# Patient Record
Sex: Male | Born: 1970 | Race: White | Hispanic: No | Marital: Married | Smoking: Current every day smoker
Health system: Southern US, Community
[De-identification: ages and names within clinical notes are randomized; demographics above are authoritative.]

## PROBLEM LIST (undated history)

## (undated) DIAGNOSIS — I4510 Unspecified right bundle-branch block: Secondary | ICD-10-CM

## (undated) DIAGNOSIS — M1990 Unspecified osteoarthritis, unspecified site: Secondary | ICD-10-CM

## (undated) DIAGNOSIS — E7800 Pure hypercholesterolemia, unspecified: Secondary | ICD-10-CM

## (undated) DIAGNOSIS — N201 Calculus of ureter: Secondary | ICD-10-CM

## (undated) DIAGNOSIS — I219 Acute myocardial infarction, unspecified: Secondary | ICD-10-CM

## (undated) DIAGNOSIS — I2510 Atherosclerotic heart disease of native coronary artery without angina pectoris: Secondary | ICD-10-CM

## (undated) DIAGNOSIS — Z87442 Personal history of urinary calculi: Secondary | ICD-10-CM

## (undated) DIAGNOSIS — M109 Gout, unspecified: Secondary | ICD-10-CM

## (undated) DIAGNOSIS — Z973 Presence of spectacles and contact lenses: Secondary | ICD-10-CM

## (undated) HISTORY — PX: LAPAROSCOPIC CHOLECYSTECTOMY: SUR755

## (undated) HISTORY — DX: Pure hypercholesterolemia, unspecified: E78.00

## (undated) HISTORY — PX: OTHER SURGICAL HISTORY: SHX169

## (undated) HISTORY — PX: SHOULDER SURGERY: SHX246

## (undated) HISTORY — PX: CHOLECYSTECTOMY: SHX55

## (undated) HISTORY — PX: APPENDECTOMY: SHX54

## (undated) HISTORY — PX: ORCHIECTOMY: SHX2116

## (undated) HISTORY — PX: BACK SURGERY: SHX140

---

## 2008-06-30 ENCOUNTER — Encounter: Admission: RE | Admit: 2008-06-30 | Discharge: 2008-06-30 | Payer: Self-pay | Admitting: Sports Medicine

## 2011-07-12 ENCOUNTER — Emergency Department (HOSPITAL_COMMUNITY)
Admission: EM | Admit: 2011-07-12 | Discharge: 2011-07-12 | Disposition: A | Discharge status: Home / Self Care | Payer: BC Managed Care – PPO | Attending: Emergency Medicine | Admitting: Emergency Medicine

## 2011-07-12 ENCOUNTER — Encounter (HOSPITAL_COMMUNITY): Payer: Self-pay | Admitting: Emergency Medicine

## 2011-07-12 DIAGNOSIS — K5732 Diverticulitis of large intestine without perforation or abscess without bleeding: Secondary | ICD-10-CM | POA: Insufficient documentation

## 2011-07-12 DIAGNOSIS — R10819 Abdominal tenderness, unspecified site: Secondary | ICD-10-CM | POA: Insufficient documentation

## 2011-07-12 DIAGNOSIS — K5792 Diverticulitis of intestine, part unspecified, without perforation or abscess without bleeding: Secondary | ICD-10-CM

## 2011-07-12 DIAGNOSIS — R319 Hematuria, unspecified: Secondary | ICD-10-CM | POA: Insufficient documentation

## 2011-07-12 DIAGNOSIS — R112 Nausea with vomiting, unspecified: Secondary | ICD-10-CM | POA: Insufficient documentation

## 2011-07-12 LAB — DIFFERENTIAL
Basophils Relative: 0 % (ref 0–1)
Eosinophils Absolute: 0.1 10*3/uL (ref 0.0–0.7)
Monocytes Absolute: 1.1 10*3/uL — ABNORMAL HIGH (ref 0.1–1.0)
Monocytes Relative: 7 % (ref 3–12)
Neutrophils Relative %: 75 % (ref 43–77)

## 2011-07-12 LAB — CBC
Hemoglobin: 16 g/dL (ref 13.0–17.0)
MCH: 29.2 pg (ref 26.0–34.0)
MCHC: 34.8 g/dL (ref 30.0–36.0)

## 2011-07-12 LAB — COMPREHENSIVE METABOLIC PANEL
Albumin: 4.2 g/dL (ref 3.5–5.2)
BUN: 18 mg/dL (ref 6–23)
Creatinine, Ser: 0.89 mg/dL (ref 0.50–1.35)
Potassium: 4.4 mEq/L (ref 3.5–5.1)
Total Protein: 7.1 g/dL (ref 6.0–8.3)

## 2011-07-12 LAB — URINALYSIS, ROUTINE W REFLEX MICROSCOPIC
Glucose, UA: NEGATIVE mg/dL
Specific Gravity, Urine: 1.03 (ref 1.005–1.030)
pH: 5.5 (ref 5.0–8.0)

## 2011-07-12 LAB — URINE MICROSCOPIC-ADD ON

## 2011-07-12 MED ORDER — SODIUM CHLORIDE 0.9 % IV BOLUS (SEPSIS)
1000.0000 mL | Freq: Once | INTRAVENOUS | Status: AC
  Administered 2011-07-12: 1000 mL via INTRAVENOUS

## 2011-07-12 MED ORDER — HYDROMORPHONE HCL PF 1 MG/ML IJ SOLN
1.0000 mg | Freq: Once | INTRAMUSCULAR | Status: AC
  Administered 2011-07-12: 1 mg via INTRAVENOUS
  Filled 2011-07-12: qty 1

## 2011-07-12 MED ORDER — PROMETHAZINE HCL 25 MG/ML IJ SOLN
25.0000 mg | Freq: Four times a day (QID) | INTRAMUSCULAR | Status: DC | PRN
Start: 2011-07-12 — End: 2011-07-13
  Filled 2011-07-12: qty 1

## 2011-07-12 MED ORDER — METRONIDAZOLE IN NACL 5-0.79 MG/ML-% IV SOLN
500.0000 mg | Freq: Once | INTRAVENOUS | Status: AC
  Administered 2011-07-12: 500 mg via INTRAVENOUS
  Filled 2011-07-12: qty 100

## 2011-07-12 MED ORDER — HYDROCODONE-ACETAMINOPHEN 5-325 MG PO TABS: 1.0000 | ORAL_TABLET | ORAL | Status: AC | PRN

## 2011-07-12 MED ORDER — METRONIDAZOLE 500 MG PO TABS: 500.0000 mg | ORAL_TABLET | Freq: Three times a day (TID) | ORAL | Status: DC

## 2011-07-12 MED ORDER — CIPROFLOXACIN HCL 500 MG PO TABS: 500.0000 mg | ORAL_TABLET | Freq: Two times a day (BID) | ORAL | Status: DC

## 2011-07-12 MED ORDER — CIPROFLOXACIN IN D5W 400 MG/200ML IV SOLN
400.0000 mg | Freq: Once | INTRAVENOUS | Status: AC
  Administered 2011-07-12: 400 mg via INTRAVENOUS
  Filled 2011-07-12: qty 200

## 2011-07-12 MED ORDER — ONDANSETRON HCL 4 MG/2ML IJ SOLN
4.0000 mg | Freq: Once | INTRAMUSCULAR | Status: AC
  Administered 2011-07-12: 4 mg via INTRAVENOUS
  Filled 2011-07-12: qty 2

## 2011-07-12 MED ORDER — PROMETHAZINE HCL 25 MG PO TABS: 25.0000 mg | ORAL_TABLET | Freq: Three times a day (TID) | ORAL | Status: DC | PRN

## 2011-07-12 NOTE — ED Notes (Signed)
Onset one week ago right lower back pain and hematuria Seen Doctor and given pain medication.  Today had outpatient CT scan and results bilateral non obstructing renal calculi 5mm. Results with patient.  Sent to ED for evaluation and treatment. Pain currently 9/10 sharp.

## 2011-07-12 NOTE — Discharge Instructions (Signed)
Read the information below.  Follow a clear liquid diet for the next 24-48 hours then slowly build back up to your regular diet.  Please return to the ER immediately if you develop a sudden worsening of your abdominal pain, uncontrolled vomiting, high fevers, or difficulty urinating.  Please call your urologist for a close follow up appointment.   You may return to the ER at any time for worsening condition or any new symptoms that concern you.  Diverticulitis A diverticulum is a small pouch or sac on the colon. Diverticulosis is the presence of these diverticula on the colon. Diverticulitis is the irritation (inflammation) or infection of diverticula. CAUSES  The colon and its diverticula contain bacteria. If food particles block the tiny opening to a diverticulum, the bacteria inside can grow and cause an increase in pressure. This leads to infection and inflammation and is called diverticulitis. SYMPTOMS   Abdominal pain and tenderness. Usually, the pain is located on the left side of your abdomen. However, it could be located elsewhere.   Fever.   Bloating.   Feeling sick to your stomach (nausea).   Throwing up (vomiting).   Abnormal stools.  DIAGNOSIS  Your caregiver will take a history and perform a physical exam. Since many things can cause abdominal pain, other tests may be necessary. Tests may include:  Blood tests.   Urine tests.   X-ray of the abdomen.   CT scan of the abdomen.  Sometimes, surgery is needed to determine if diverticulitis or other conditions are causing your symptoms. TREATMENT  Most of the time, you can be treated without surgery. Treatment includes:  Resting the bowels by only having liquids for a few days. As you improve, you will need to eat a low-fiber diet.   Intravenous (IV) fluids if you are losing body fluids (dehydrated).   Antibiotic medicines that treat infections may be given.   Pain and nausea medicine, if needed.   Surgery if the  inflamed diverticulum has burst.  HOME CARE INSTRUCTIONS   Try a clear liquid diet (broth, tea, or water for as long as directed by your caregiver). You may then gradually begin a low-fiber diet as tolerated. A low-fiber diet is a diet with less than 10 grams of fiber. Choose the foods below to reduce fiber in the diet:   White breads, cereals, rice, and pasta.   Cooked fruits and vegetables or soft fresh fruits and vegetables without the skin.   Ground or well-cooked tender beef, ham, veal, lamb, pork, or poultry.   Eggs and seafood.   After your diverticulitis symptoms have improved, your caregiver may put you on a high-fiber diet. A high-fiber diet includes 14 grams of fiber for every 1000 calories consumed. For a standard 2000 calorie diet, you would need 28 grams of fiber. Follow these diet guidelines to help you increase the fiber in your diet. It is important to slowly increase the amount fiber in your diet to avoid gas, constipation, and bloating.   Choose whole-grain breads, cereals, pasta, and brown rice.   Choose fresh fruits and vegetables with the skin on. Do not overcook vegetables because the more vegetables are cooked, the more fiber is lost.   Choose more nuts, seeds, legumes, dried peas, beans, and lentils.   Look for food products that have greater than 3 grams of fiber per serving on the Nutrition Facts label.   Take all medicine as directed by your caregiver.   If your caregiver has given you  a follow-up appointment, it is very important that you go. Not going could result in lasting (chronic) or permanent injury, pain, and disability. If there is any problem keeping the appointment, call to reschedule.  SEEK MEDICAL CARE IF:   Your pain does not improve.   You have a hard time advancing your diet beyond clear liquids.   Your bowel movements do not return to normal.  SEEK IMMEDIATE MEDICAL CARE IF:   Your pain becomes worse.   You have an oral temperature  above 102 F (38.9 C), not controlled by medicine.   You have repeated vomiting.   You have bloody or black, tarry stools.   Symptoms that brought you to your caregiver become worse or are not getting better.  MAKE SURE YOU:   Understand these instructions.   Will watch your condition.   Will get help right away if you are not doing well or get worse.  Document Released: 11/03/2004 Document Revised: 01/13/2011 Document Reviewed: 03/01/2010 [facility] Patient Information 2012 [place], [place]. Low Fiber and Residue Restricted Diet A low fiber diet restricts foods that contain carbohydrates that are not digested in the small intestine. A diet containing about 10 g of fiber is considered low fiber. The diet needs to be individualized to suit patient tolerances and preferences and to avoid unnecessary restrictions. Generally, the foods emphasized in a low fiber diet have no skins or seeds. They may have been processed to remove bran, germ, or husks. Cooking may not necessarily eliminate the fiber. Cooking may, in fact, enable a greater quantity of fiber to be consumed in a lesser volume. Legumes and nuts are also restricted. The term low residue has also been used to describe low fiber diets, although the two are not the same. Residue refers to any substance that adds to bowel (colonic) contents, such as sloughed cells and intestinal bacteria, in addition to fiber. Residue-containing foods, prunes and prune juice, milk, and connective tissue from meats may also need to be eliminated. It is important to eliminate these foods during sudden (acute) attacks of inflammatory bowel disease, when there is a partial obstruction due to another reason, or when minimal fecal output is desired. When these problems are gone, a more normal diet may be used. PURPOSE  Prevent blockage of a partially obstructed or narrowed gastrointestinal tract.   Reduce stool weight and volume.   Slow the movement of waste.    WHEN IS THIS DIET USED?  Acute phase of Crohn's disease, ulcerative colitis, regional enteritis, or diverticulitis.   Narrowing (stenosis) of intestinal or esophageal tubes (lumina).   Transitional diet following surgery, injury (trauma), or illness.  ADEQUACY This diet is nutritionally adequate based on individual food choices according to the Recommended Dietary Allowances of the [employer]. CHOOSING FOODS Check labels, especially on foods from the starch list. Often, dietary fiber content is listed with the Nutrition Facts panel.  Breads and Starches  Allowed: White, [place], and pita breads, plain rolls, buns, or sweet rolls, doughnuts, waffles, pancakes, bagels. Plain muffins, sweet breads, biscuits, matzoth. Flour. Soda, saltine, or graham crackers. Pretzels, rusks, melba toast, zwieback. Cooked cereals: cornmeal, farina, cream cereals. Dry cereals: refined corn, wheat, rice, and oat cereals (check label). Potatoes prepared any way without skins, refined macaroni, spaghetti, noodles, refined rice.   Avoid: Bread, rolls, or crackers made with whole-wheat, multigrains, rye, bran seeds, nuts, or coconut. Corn tortillas, table-shells. Corn chips, tortilla chips. Cereals containing whole-grains, multigrains, bran, coconut, nuts, or raisins. Cooked or  dry oatmeal. Coarse wheat cereals, granola. Cereals advertised as "high fiber." Potato skins. Whole-grain pasta, wild or brown rice. Popcorn.  Vegetables  Allowed:  Strained tomato and vegetable juices. Fresh: tender lettuce, cucumber, cabbage, spinach, bean sprouts. Cooked, canned: asparagus, bean sprouts, cut green or wax beans, cauliflower, pumpkin, beets, mushrooms, olives, spinach, yellow squash, tomato, tomato sauce (no seeds), zucchini (peeled), turnips. Canned sweet potatoes. Small amounts of celery, onion, radish, and green pepper may be used. Keep servings limited to  cup.   Avoid: Fresh, cooked, or canned: artichokes,  baked beans, beet greens, broccoli, Brussels sprouts, French-style green beans, corn, kale, legumes, peas, sweet potatoes. Cooked: green or red cabbage, spinach. Avoid large servings of any vegetables.  Fruit  Allowed:  All fruit juices except prune juice. Cooked or canned: apricots applesauce, cantaloupe, cherries, grapefruit, grapes, kiwi, mandarin oranges, peaches, pears, fruit cocktail, pineapple, plums, watermelon. Fresh: banana, grapes, cantaloupe, avocado, cherries, pineapple, grapefruit, kiwi, nectarines, peaches, oranges, blueberries, plums. Keep servings limited to  cup or 1 piece.   Avoid: Fresh: apple with or without skin, apricots, mango, pears, raspberries, strawberries. Prune juice, stewed or dried prunes. Dried fruits, raisins, dates. Avoid large servings of all fresh fruits.  Meat and Meat Substitutes  Allowed:  Ground or well-cooked tender beef, ham, veal, lamb, pork, or poultry. Eggs, plain cheese. Fish, oysters, shrimp, lobster, other seafood. Liver, organ meats.   Avoid: Tough, fibrous meats with gristle. Peanut butter, smooth or chunky. Cheese with seeds, nuts, or other foods not allowed. Nuts, seeds, legumes, dried peas, beans, lentils.  Milk  Allowed:  All milk products except those not allowed. Milk and milk product consumption should be minimal when low residue is desired.   Avoid: Yogurt that contains nuts or seeds.  Soups and Combination Foods  Allowed:  Bouillon, broth, or cream soups made from allowed foods. Any strained soup. Casseroles or mixed dishes made with allowed foods.   Avoid: Soups made from vegetables that are not allowed or that contain other foods not allowed.  Desserts and Sweets  Allowed:  Plain cakes and cookies, pie made with allowed fruit, pudding, custard, cream pie. Gelatin, fruit, ice, sherbet, frozen ice pops. Ice cream, ice milk without nuts. Plain hard candy, honey, jelly, molasses, syrup, sugar, chocolate syrup, gumdrops, marshmallows.     Avoid: Desserts, cookies, or candies that contain nuts, peanut butter, or dried fruits. Jams, preserves with seeds, marmalade.  Fats and Oils  Allowed:  Margarine, butter, cream, mayonnaise, salad oils, plain salad dressings made from allowed foods. Plain gravy, crisp bacon without rind.   Avoid: Seeds, nuts, olives. Avocados.  Beverages  Allowed:  All, except those listed to avoid.   Avoid: Fruit juices with high pulp, prune juice.  Condiments  Allowed:  Ketchup, mustard, horseradish, vinegar, cream sauce, cheese sauce, cocoa powder. Spices in moderation: allspice, basil, bay leaves, celery powder or leaves, cinnamon, cumin powder, curry powder, ginger, mace, marjoram, onion or garlic powder, oregano, paprika, parsley flakes, ground pepper, rosemary, sage, savory, tarragon, thyme, turmeric.   Avoid: Coconut, pickles.  SAMPLE MEAL PLAN The following menu is provided as a sample. Your daily menu plans will vary. Be sure to include a minimum of the following each day in order to provide essential nutrients for the adult:  Starch/Bread/Cereal Group, 6 servings.   Fruit/Vegetable Group, 5 servings.   Meat/Meat Substitute Group, 2 servings.   Milk/Milk Substitute Group, 2 servings.  A serving is equal to  cup for fruits, vegetables, and cooked cereals or  1 piece for foods such as a piece of bread, 1 orange, or 1 apple. For dry cereals and crackers, use serving sizes listed on the label. Combination foods may count as full or partial servings from various food groups. Fats, desserts, and sweets may be added to the meal plan after the requirements for essential nutrients are met. SAMPLE MENU Breakfast   cup orange juice.   1 boiled egg.   1 slice white toast.   Margarine.    cup cornflakes.   1 cup milk.   Beverage.  Lunch   cup chicken noodle soup.   2 to 3 oz sliced roast beef.   2 slices seedless rye bread.   Mayonnaise.    cup tomato juice.   1 small  banana.   Beverage.  Dinner  3 oz baked chicken.    cup scalloped potatoes.    cup cooked beets.   White dinner roll.   Margarine.    cup canned peaches.   Beverage.  Document Released: 07/16/2001 Document Revised: 01/13/2011 Document Reviewed: 12/27/2010 [facility] Patient Information 2012 [place], [place].

## 2011-07-12 NOTE — ED Notes (Signed)
Pt states most of his pain is on the left flank. Pt states he has had n/v x1. Pt currently nauseous. Pt appears mildly diaphoretic.

## 2011-07-12 NOTE — ED Notes (Signed)
PA AWARE PT AND FAMILY WOULD LIKE TO TALK TO HER ABOUT THE PLAN OF CARE

## 2011-07-12 NOTE — ED Provider Notes (Signed)
History     CSN: [id number]  Arrival date & time 07/12/11  1432   First MD Initiated Contact with Patient 07/12/11 1545      Chief Complaint  Patient presents with  . Hematuria    (Consider location/radiation/quality/duration/timing/severity/associated sxs/prior treatment) HPI Comments: Patient reports 1 week ago he developed hematuria and right flank pain, was seen by his PCP Wyvonnia Lora, in [place], [place], had a negative UA and was thought to have a kidney stone.  Patient continued to have pain and hematuria, was seen again yesterday and was set up for a CT scan.  CT report (outside facility) showed right kidney stones, no ureteral stones, and possible mild left sigmoid diverticulitis.  Pt states he started developing left flank pain today.  Denies fevers, has had some sweats.  Developed N/V today.  No change in bowel habits - no constipation, diarrhea, or hematochezia.   No hx abdominal surgeries.    Patient is a 41 y.o. male presenting with hematuria. The history is provided by the patient.  Hematuria Irritative symptoms do not include frequency or urgency. Associated symptoms include abdominal pain, nausea and vomiting. Pertinent negatives include no dysuria or fever.    History reviewed. No pertinent past medical history.  Past Surgical History  Procedure Date  . Cholecystectomy   . Shoulder surgery   . Appendectomy     No family history on file.  History  Substance Use Topics  . Smoking status: Current Everyday Smoker  . Smokeless tobacco: Not on file  . Alcohol Use: Yes      Review of Systems  Constitutional: Negative for fever.  Respiratory: Negative for cough and shortness of breath.   Cardiovascular: Negative for chest pain.  Gastrointestinal: Positive for nausea, vomiting and abdominal pain. Negative for diarrhea, constipation and blood in stool.  Genitourinary: Positive for hematuria. Negative for dysuria, urgency and frequency.    Allergies  Review of  patient's allergies indicates no known allergies.  Home Medications   Current Outpatient Rx  Name Route Sig Dispense Refill  . CETIRIZINE HCL 10 MG PO TABS Oral Take 10 mg by mouth daily.    . IBUPROFEN 200 MG PO TABS Oral Take 600 mg by mouth every 6 (six) hours as needed. For pain      BP 137/87  Pulse 76  Temp(Src) 97.6 F (36.4 C) (Oral)  Resp 20  SpO2 97%  Physical Exam  Nursing note and vitals reviewed. Constitutional: He is oriented to person, place, and time. He appears well-developed and well-nourished.  HENT:  Head: Normocephalic and atraumatic.  Neck: Neck supple.  Cardiovascular: Normal rate, regular rhythm and normal heart sounds.   Pulmonary/Chest: Breath sounds normal. No respiratory distress. He has no wheezes. He has no rales. He exhibits no tenderness.  Abdominal: Soft. Bowel sounds are normal. He exhibits no distension and no mass. There is tenderness. There is no rebound and no guarding.       Mild lower abdominal tenderness, diffuse.    Neurological: He is alert and oriented to person, place, and time.  Skin: He is not diaphoretic.    ED Course  Procedures (including critical care time)  Labs Reviewed  CBC - Abnormal; Notable for the following:    WBC 16.5 (*)    All other components within normal limits  DIFFERENTIAL - Abnormal; Notable for the following:    Neutro Abs 12.3 (*)    Monocytes Absolute 1.1 (*)    All other components within normal limits  COMPREHENSIVE METABOLIC PANEL - Abnormal; Notable for the following:    Glucose, Bld 120 (*)    Total Bilirubin 0.2 (*)    All other components within normal limits  URINALYSIS, ROUTINE W REFLEX MICROSCOPIC - Abnormal; Notable for the following:    Color, Urine AMBER (*) BIOCHEMICALS MAY BE AFFECTED BY COLOR   APPearance HAZY (*)    Hgb urine dipstick LARGE (*)    Bilirubin Urine SMALL (*)    Ketones, ur 15 (*)    Protein, ur 30 (*)    Leukocytes, UA SMALL (*)    All other components within  normal limits  URINE MICROSCOPIC-ADD ON - Abnormal; Notable for the following:    Squamous Epithelial / LPF FEW (*)    Casts GRANULAR CAST (*)    All other components within normal limits  URINE CULTURE   No results found.  CT from outside facility - report with patient.    8:41 PM Patient reports he is feeling much better.  Requests PO.  Will do PO trial.  Anticipate discharge home.   Pt did well with PO trial.  Feeling well.  Discussed care instructions for home, return precautions.  Patient verbalizes understanding and agrees with plan.    Filed Vitals:   07/12/11 2119  BP: 118/68  Pulse: 95  Temp: 97.4 F (36.3 C)  Resp:      1. Diverticulitis   2. Hematuria       MDM  Afebrile nontoxic patient with hematuria, no obvious infection on exam.  Outside CT shows likely mild diverticulitis.  Pt having left flank pain on presentation.  Urine sent for culture.  Pt given cipro and flagyl IV in department, pt feeling much better, tolerating PO.  Pt d/c home with cipro/flagyl, PCP and urology follow up (pt has urologist at [facility]).  Care instructions, return precautions given.  Patient verbalizes understanding and agrees with plan.          Dillard Cannon [place], [place] 07/12/11 2201

## 2011-07-12 NOTE — ED Notes (Signed)
Pt aware of urine sample needed but is unable to void, urine cup at bedside

## 2011-07-12 NOTE — ED Notes (Signed)
Pt with return of nausea and increased pain . Pa aware and meds ordered

## 2011-07-12 NOTE — ED Notes (Signed)
Patient reports for the past week he has had blood in his urine. He did see his pmd for this. States had some right flank pain this week but states was not unbearable. States had ct done this morning around 7 am and approx 30 min after ct began with bad left flank pain accompanied by nausea and vomiting. Currently he is not nauseated. States the pain med he was given has "not done much for the pain". Pa made aware and will see pt.

## 2011-07-13 ENCOUNTER — Encounter (HOSPITAL_COMMUNITY): Payer: Self-pay | Admitting: Emergency Medicine

## 2011-07-13 ENCOUNTER — Emergency Department (HOSPITAL_COMMUNITY)
Admission: EM | Admit: 2011-07-13 | Discharge: 2011-07-13 | Disposition: A | Discharge status: Home / Self Care | Payer: BC Managed Care – PPO | Attending: Emergency Medicine | Admitting: Emergency Medicine

## 2011-07-13 DIAGNOSIS — K5792 Diverticulitis of intestine, part unspecified, without perforation or abscess without bleeding: Secondary | ICD-10-CM

## 2011-07-13 DIAGNOSIS — K5732 Diverticulitis of large intestine without perforation or abscess without bleeding: Secondary | ICD-10-CM | POA: Insufficient documentation

## 2011-07-13 DIAGNOSIS — M549 Dorsalgia, unspecified: Secondary | ICD-10-CM | POA: Insufficient documentation

## 2011-07-13 DIAGNOSIS — Z9079 Acquired absence of other genital organ(s): Secondary | ICD-10-CM | POA: Insufficient documentation

## 2011-07-13 DIAGNOSIS — R319 Hematuria, unspecified: Secondary | ICD-10-CM | POA: Insufficient documentation

## 2011-07-13 DIAGNOSIS — R109 Unspecified abdominal pain: Secondary | ICD-10-CM

## 2011-07-13 DIAGNOSIS — Z9089 Acquired absence of other organs: Secondary | ICD-10-CM | POA: Insufficient documentation

## 2011-07-13 DIAGNOSIS — R112 Nausea with vomiting, unspecified: Secondary | ICD-10-CM | POA: Insufficient documentation

## 2011-07-13 LAB — COMPREHENSIVE METABOLIC PANEL
ALT: 17 U/L (ref 0–53)
AST: 17 U/L (ref 0–37)
Albumin: 4.1 g/dL (ref 3.5–5.2)
Alkaline Phosphatase: 81 U/L (ref 39–117)
BUN: 14 mg/dL (ref 6–23)
Chloride: 101 mEq/L (ref 96–112)
Potassium: 3.9 mEq/L (ref 3.5–5.1)
Sodium: 136 mEq/L (ref 135–145)
Total Bilirubin: 0.3 mg/dL (ref 0.3–1.2)

## 2011-07-13 LAB — CBC
Hemoglobin: 15.2 g/dL (ref 13.0–17.0)
MCHC: 35.3 g/dL (ref 30.0–36.0)
RBC: 5.17 MIL/uL (ref 4.22–5.81)

## 2011-07-13 LAB — DIFFERENTIAL
Basophils Relative: 0 % (ref 0–1)
Lymphs Abs: 1.6 10*3/uL (ref 0.7–4.0)
Monocytes Relative: 7 % (ref 3–12)
Neutro Abs: 11.8 10*3/uL — ABNORMAL HIGH (ref 1.7–7.7)
Neutrophils Relative %: 82 % — ABNORMAL HIGH (ref 43–77)

## 2011-07-13 LAB — URINALYSIS, ROUTINE W REFLEX MICROSCOPIC
Bilirubin Urine: NEGATIVE
Glucose, UA: NEGATIVE mg/dL
Ketones, ur: NEGATIVE mg/dL
Protein, ur: NEGATIVE mg/dL

## 2011-07-13 LAB — URINE MICROSCOPIC-ADD ON

## 2011-07-13 MED ORDER — ONDANSETRON 8 MG PO TBDP: 8.0000 mg | ORAL_TABLET | Freq: Three times a day (TID) | ORAL | Status: DC | PRN

## 2011-07-13 MED ORDER — MORPHINE SULFATE 4 MG/ML IJ SOLN
4.0000 mg | Freq: Once | INTRAMUSCULAR | Status: AC
  Administered 2011-07-13: 4 mg via INTRAVENOUS
  Filled 2011-07-13: qty 1

## 2011-07-13 MED ORDER — ONDANSETRON 8 MG PO TBDP: 8.0000 mg | ORAL_TABLET | Freq: Three times a day (TID) | ORAL | Status: AC | PRN

## 2011-07-13 MED ORDER — SODIUM CHLORIDE 0.9 % IV BOLUS (SEPSIS)
1000.0000 mL | Freq: Once | INTRAVENOUS | Status: AC
  Administered 2011-07-13: 1000 mL via INTRAVENOUS

## 2011-07-13 MED ORDER — HYDROMORPHONE HCL PF 1 MG/ML IJ SOLN
1.0000 mg | Freq: Once | INTRAMUSCULAR | Status: AC
  Administered 2011-07-13: 1 mg via INTRAVENOUS
  Filled 2011-07-13: qty 1

## 2011-07-13 MED ORDER — ONDANSETRON HCL 4 MG/2ML IJ SOLN
4.0000 mg | Freq: Once | INTRAMUSCULAR | Status: AC
  Administered 2011-07-13: 4 mg via INTRAVENOUS
  Filled 2011-07-13: qty 2

## 2011-07-13 MED ORDER — CIPROFLOXACIN HCL 500 MG PO TABS
500.0000 mg | ORAL_TABLET | Freq: Once | ORAL | Status: AC
  Administered 2011-07-13: 500 mg via ORAL
  Filled 2011-07-13: qty 1

## 2011-07-13 MED ORDER — METRONIDAZOLE 500 MG PO TABS
500.0000 mg | ORAL_TABLET | Freq: Once | ORAL | Status: AC
  Administered 2011-07-13: 500 mg via ORAL
  Filled 2011-07-13: qty 1

## 2011-07-13 NOTE — ED Notes (Signed)
Pt was having abd pain and had a ct scan done at [facility] and they [facility] to [facility] yesterday. Was treated for divicticulits at [facility] last night. Went home and return this am with flank pain testicle pain on lt side and still has blood in the urine. Nausea.

## 2011-07-13 NOTE — ED Provider Notes (Signed)
History     CSN: [id number]  Arrival date & time 07/13/11  0829   First MD Initiated Contact with Patient 07/13/11 0844      9:29 AM HPI Patient reports he is here with persistent left lower abdominal pain, left flank pain and left lower back pain. Reports he was seen at [facility] yesterday and treated for diverticulitis after having a CT scan done at [facility]. Reports the CT scan indicated diverticulitis the patient also reports having blood in urine. Patient reports last eye discharged after his pharmacy closed and was unable to have his Cipro, Flagyl, analgesics and antiemetics failed. States has been feeling nauseous and vomiting all night as well as worsening left lower quadrant pain. Reports he is returned because pain and nausea are so severe in symptoms appear to be worsening. Patient is a 41 y.o. male presenting with abdominal pain. The history is provided by the patient.  Abdominal Pain The primary symptoms of the illness include abdominal pain, nausea and vomiting. The primary symptoms of the illness do not include diarrhea or dysuria. The onset of the illness was gradual. The problem has been gradually worsening.  The patient states that she believes she is currently not pregnant. The patient has not had a change in bowel habit. Additional symptoms associated with the illness include hematuria and back pain. Symptoms associated with the illness do not include chills, anorexia, diaphoresis, constipation, urgency or frequency.    History reviewed. No pertinent past medical history.  Past Surgical History  Procedure Date  . Cholecystectomy   . Shoulder surgery   . Appendectomy     No family history on file.  History  Substance Use Topics  . Smoking status: Current Everyday Smoker  . Smokeless tobacco: Not on file  . Alcohol Use: Yes      Review of Systems  Constitutional: Negative for chills and diaphoresis.  Gastrointestinal: Positive for nausea, vomiting and  abdominal pain. Negative for diarrhea, constipation and anorexia.  Genitourinary: Positive for hematuria. Negative for dysuria, urgency and frequency.  Musculoskeletal: Positive for back pain.  All other systems reviewed and are negative.    Allergies  Review of patient's allergies indicates no known allergies.  Home Medications   Current Outpatient Rx  Name Route Sig Dispense Refill  . CETIRIZINE HCL 10 MG PO TABS Oral Take 10 mg by mouth daily.    Marland Kitchen CIPROFLOXACIN HCL 500 MG PO TABS Oral Take 1 tablet (500 mg total) by mouth 2 (two) times daily. 14 tablet 0  . HYDROCODONE-ACETAMINOPHEN 5-325 MG PO TABS Oral Take 1-2 tablets by mouth every 4 (four) hours as needed for pain. 25 tablet 0  . IBUPROFEN 200 MG PO TABS Oral Take 600 mg by mouth every 6 (six) hours as needed. For pain    . METRONIDAZOLE 500 MG PO TABS Oral Take 1 tablet (500 mg total) by mouth 3 (three) times daily. 21 tablet 0  . PROMETHAZINE HCL 25 MG PO TABS Oral Take 1 tablet (25 mg total) by mouth every 8 (eight) hours as needed for nausea. 20 tablet 0    BP 148/72  Pulse 69  Temp(Src) 98.4 F (36.9 C) (Oral)  Resp 18  SpO2 96%  Physical Exam  Vitals reviewed. Constitutional: He is oriented to person, place, and time. He appears well-developed and well-nourished.  HENT:  Head: Normocephalic and atraumatic.  Eyes: Conjunctivae are normal. Pupils are equal, round, and reactive to light.  Neck: Normal range of motion. Neck  supple.  Cardiovascular: Normal rate, regular rhythm and normal heart sounds.   Pulmonary/Chest: Effort normal and breath sounds normal.  Abdominal: Soft. Bowel sounds are normal. He exhibits no distension and no mass. There is no tenderness. There is no rebound, no guarding and no CVA tenderness.  Genitourinary: Penis normal. No phimosis, paraphimosis, penile erythema or penile tenderness.       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  Skin: Skin is warm and dry. No rash noted. No erythema. No pallor.  Psychiatric: He has a normal mood and affect. His behavior is normal.    ED Course  Procedures   Results for orders placed during the hospital encounter of 07/13/11  CBC      Component Value Range   WBC 14.4 (*) 4.0 - 10.5 (K/uL)   RBC 5.17  4.22 - 5.81 (MIL/uL)   Hemoglobin 15.2  13.0 - 17.0 (g/dL)   HCT 16.1  09.6 - 04.5 (%)   MCV 83.2  78.0 - 100.0 (fL)   MCH 29.4  26.0 - 34.0 (pg)   MCHC 35.3  30.0 - 36.0 (g/dL)   RDW 40.9  81.1 - 91.4 (%)   Platelets 192  150 - 400 (K/uL)  DIFFERENTIAL      Component Value Range   Neutrophils Relative 82 (*) 43 - 77 (%)   Neutro Abs 11.8 (*) 1.7 - 7.7 (K/uL)   Lymphocytes Relative 11 (*) 12 - 46 (%)   Lymphs Abs 1.6  0.7 - 4.0 (K/uL)   Monocytes Relative 7  3 - 12 (%)   Monocytes Absolute 1.0  0.1 - 1.0 (K/uL)   Eosinophils Relative 0  0 - 5 (%)   Eosinophils Absolute 0.0  0.0 - 0.7 (K/uL)   Basophils Relative 0  0 - 1 (%)   Basophils Absolute 0.0  0.0 - 0.1 (K/uL)  COMPREHENSIVE METABOLIC PANEL      Component Value Range   Sodium 136  135 - 145 (mEq/L)   Potassium 3.9  3.5 - 5.1 (mEq/L)   Chloride 101  96 - 112 (mEq/L)   CO2 25  19 - 32 (mEq/L)   Glucose, Bld 131 (*) 70 - 99 (mg/dL)   BUN 14  6 - 23 (mg/dL)   Creatinine, Ser 7.82  0.50 - 1.35 (mg/dL)   Calcium 8.7  8.4 - 95.6 (mg/dL)   Total Protein 7.2  6.0 - 8.3 (g/dL)   Albumin 4.1  3.5 - 5.2 (g/dL)   AST 17  0 - 37 (U/L)   ALT 17  0 - 53 (U/L)   Alkaline Phosphatase 81  39 - 117 (U/L)   Total Bilirubin 0.3  0.3 - 1.2 (mg/dL)   GFR calc non Af Amer >90  >90 (mL/min)   GFR calc Af Amer >90  >90 (mL/min)  URINALYSIS, ROUTINE W REFLEX MICROSCOPIC      Component Value Range   Color, Urine YELLOW  YELLOW    APPearance CLOUDY (*) CLEAR    Specific Gravity, Urine 1.032 (*) 1.005 - 1.030    pH 5.5  5.0 - 8.0    Glucose, UA NEGATIVE  NEGATIVE (mg/dL)   Hgb urine dipstick LARGE (*) NEGATIVE     Bilirubin Urine NEGATIVE  NEGATIVE    Ketones, ur NEGATIVE  NEGATIVE (mg/dL)   Protein, ur NEGATIVE  NEGATIVE (mg/dL)   Urobilinogen, UA 0.2  0.0 - 1.0 (mg/dL)   Nitrite  NEGATIVE  NEGATIVE    Leukocytes, UA NEGATIVE  NEGATIVE   URINE MICROSCOPIC-ADD ON      Component Value Range   Squamous Epithelial / LPF RARE  RARE    WBC, UA 3-6  <3 (WBC/hpf)   RBC / HPF 21-50  <3 (RBC/hpf)   Bacteria, UA FEW (*) RARE    Urine-Other MUCOUS PRESENT      MDM   9:00 AM  Pt not in room  2:05 PM Patient has improved labs from yesterday. Pain improved significantly after analgesia and antiemetics. Advised patient to have his medications filled as soon as he was discharged . Patient has not failed outpatient treatment since he has not had medication filled. I have ordered for his PO antibiotics to be give here since he has not taken it. At this point the patient has received 2 doses of ABx: 1 IV dose last night. And 1 PO dose slightly >12 hours later. Since labs have improved as well as pain and nausea, I discussed discharging patient with strict instructions to fill medication no matter what. Advised followup primary care physician if persistent pain or return to emergency department for worsening pain voices understanding and is ready for discharge.        Thomasene Lot, PA-C 07/14/11 [id number]

## 2011-07-13 NOTE — Discharge Instructions (Signed)
Low Fiber and Residue Restricted Diet A low fiber diet restricts foods that contain carbohydrates that are not digested in the small intestine. A diet containing about 10 g of fiber is considered low fiber. The diet needs to be individualized to suit patient tolerances and preferences and to avoid unnecessary restrictions. Generally, the foods emphasized in a low fiber diet have no skins or seeds. They may have been processed to remove bran, germ, or husks. Cooking may not necessarily eliminate the fiber. Cooking may, in fact, enable a greater quantity of fiber to be consumed in a lesser volume. Legumes and nuts are also restricted. The term low residue has also been used to describe low fiber diets, although the two are not the same. Residue refers to any substance that adds to bowel (colonic) contents, such as sloughed cells and intestinal bacteria, in addition to fiber. Residue-containing foods, prunes and prune juice, milk, and connective tissue from meats may also need to be eliminated. It is important to eliminate these foods during sudden (acute) attacks of inflammatory bowel disease, when there is a partial obstruction due to another reason, or when minimal fecal output is desired. When these problems are gone, a more normal diet may be used. PURPOSE  Prevent blockage of a partially obstructed or narrowed gastrointestinal tract.   Reduce stool weight and volume.   Slow the movement of waste.  WHEN IS THIS DIET USED?  Acute phase of Crohn's disease, ulcerative colitis, regional enteritis, or diverticulitis.   Narrowing (stenosis) of intestinal or esophageal tubes (lumina).   Transitional diet following surgery, injury (trauma), or illness.  ADEQUACY This diet is nutritionally adequate based on individual food choices according to the Recommended Dietary Allowances of the National Research Council. CHOOSING FOODS Check labels, especially on foods from the starch list. Often, dietary fiber  content is listed with the Nutrition Facts panel.  Breads and Starches  Allowed: White, French, and pita breads, plain rolls, buns, or sweet rolls, doughnuts, waffles, pancakes, bagels. Plain muffins, sweet breads, biscuits, matzoth. Flour. Soda, saltine, or graham crackers. Pretzels, rusks, melba toast, zwieback. Cooked cereals: cornmeal, farina, cream cereals. Dry cereals: refined corn, wheat, rice, and oat cereals (check label). Potatoes prepared any way without skins, refined macaroni, spaghetti, noodles, refined rice.   Avoid: Bread, rolls, or crackers made with whole-wheat, multigrains, rye, bran seeds, nuts, or coconut. Corn tortillas, table-shells. Corn chips, tortilla chips. Cereals containing whole-grains, multigrains, bran, coconut, nuts, or raisins. Cooked or dry oatmeal. Coarse wheat cereals, granola. Cereals advertised as "high fiber." Potato skins. Whole-grain pasta, wild or brown rice. Popcorn.  Vegetables  Allowed:  Strained tomato and vegetable juices. Fresh: tender lettuce, cucumber, cabbage, spinach, bean sprouts. Cooked, canned: asparagus, bean sprouts, cut green or wax beans, cauliflower, pumpkin, beets, mushrooms, olives, spinach, yellow squash, tomato, tomato sauce (no seeds), zucchini (peeled), turnips. Canned sweet potatoes. Small amounts of celery, onion, radish, and green pepper may be used. Keep servings limited to  cup.   Avoid: Fresh, cooked, or canned: artichokes, baked beans, beet greens, broccoli, Brussels sprouts, French-style green beans, corn, kale, legumes, peas, sweet potatoes. Cooked: green or red cabbage, spinach. Avoid large servings of any vegetables.  Fruit  Allowed:  All fruit juices except prune juice. Cooked or canned: apricots applesauce, cantaloupe, cherries, grapefruit, grapes, kiwi, mandarin oranges, peaches, pears, fruit cocktail, pineapple, plums, watermelon. Fresh: banana, grapes, cantaloupe, avocado, cherries, pineapple, grapefruit, kiwi,  nectarines, peaches, oranges, blueberries, plums. Keep servings limited to  cup or 1 piece.     Avoid: Fresh: apple with or without skin, apricots, mango, pears, raspberries, strawberries. Prune juice, stewed or dried prunes. Dried fruits, raisins, dates. Avoid large servings of all fresh fruits.  Meat and Meat Substitutes  Allowed:  Ground or well-cooked tender beef, ham, veal, lamb, pork, or poultry. Eggs, plain cheese. Fish, oysters, shrimp, lobster, other seafood. Liver, organ meats.   Avoid: Tough, fibrous meats with gristle. Peanut butter, smooth or chunky. Cheese with seeds, nuts, or other foods not allowed. Nuts, seeds, legumes, dried peas, beans, lentils.  Milk  Allowed:  All milk products except those not allowed. Milk and milk product consumption should be minimal when low residue is desired.   Avoid: Yogurt that contains nuts or seeds.  Soups and Combination Foods  Allowed:  Bouillon, broth, or cream soups made from allowed foods. Any strained soup. Casseroles or mixed dishes made with allowed foods.   Avoid: Soups made from vegetables that are not allowed or that contain other foods not allowed.  Desserts and Sweets  Allowed:  Plain cakes and cookies, pie made with allowed fruit, pudding, custard, cream pie. Gelatin, fruit, ice, sherbet, frozen ice pops. Ice cream, ice milk without nuts. Plain hard candy, honey, jelly, molasses, syrup, sugar, chocolate syrup, gumdrops, marshmallows.   Avoid: Desserts, cookies, or candies that contain nuts, peanut butter, or dried fruits. Jams, preserves with seeds, marmalade.  Fats and Oils  Allowed:  Margarine, butter, cream, mayonnaise, salad oils, plain salad dressings made from allowed foods. Plain gravy, crisp bacon without rind.   Avoid: Seeds, nuts, olives. Avocados.  Beverages  Allowed:  All, except those listed to avoid.   Avoid: Fruit juices with high pulp, prune juice.  Condiments  Allowed:  Ketchup, mustard, horseradish,  vinegar, cream sauce, cheese sauce, cocoa powder. Spices in moderation: allspice, basil, bay leaves, celery powder or leaves, cinnamon, cumin powder, curry powder, ginger, mace, marjoram, onion or garlic powder, oregano, paprika, parsley flakes, ground pepper, rosemary, sage, savory, tarragon, thyme, turmeric.   Avoid: Coconut, pickles.  SAMPLE MEAL PLAN The following menu is provided as a sample. Your daily menu plans will vary. Be sure to include a minimum of the following each day in order to provide essential nutrients for the adult:  Starch/Bread/Cereal Group, 6 servings.   Fruit/Vegetable Group, 5 servings.   Meat/Meat Substitute Group, 2 servings.   Milk/Milk Substitute Group, 2 servings.  A serving is equal to  cup for fruits, vegetables, and cooked cereals or 1 piece for foods such as a piece of bread, 1 orange, or 1 apple. For dry cereals and crackers, use serving sizes listed on the label. Combination foods may count as full or partial servings from various food groups. Fats, desserts, and sweets may be added to the meal plan after the requirements for essential nutrients are met. SAMPLE MENU Breakfast   cup orange juice.   1 boiled egg.   1 slice white toast.   Margarine.    cup cornflakes.   1 cup milk.   Beverage.  Lunch   cup chicken noodle soup.   2 to 3 oz sliced roast beef.   2 slices seedless rye bread.   Mayonnaise.    cup tomato juice.   1 small banana.   Beverage.  Dinner  3 oz baked chicken.    cup scalloped potatoes.    cup cooked beets.   White dinner roll.   Margarine.    cup canned peaches.   Beverage.  Document Released: 07/16/2001 Document Revised: 01/13/2011   Document Reviewed: 12/27/2010 Exit[employer] Patient Information 2012 [employer]. 

## 2011-07-14 NOTE — ED Provider Notes (Signed)
Medical screening examination/treatment/procedure(s) were performed by non-physician practitioner and as supervising physician I was immediately available for consultation/collaboration.   Jase Reep B. Terricka Onofrio, MD 07/14/11 1246 

## 2011-07-15 LAB — URINE CULTURE
Colony Count: NO GROWTH
Culture  Setup Time: 201306041918

## 2011-07-18 NOTE — ED Provider Notes (Signed)
Medical screening examination/treatment/procedure(s) were performed by non-physician practitioner and as supervising physician I was immediately available for consultation/collaboration. Devoria Albe, MD, FACEP   Ward Givens, MD 07/18/11 1500

## 2015-09-10 ENCOUNTER — Ambulatory Visit: Payer: BLUE CROSS/BLUE SHIELD | Attending: Neurosurgery | Admitting: Physical Therapy

## 2015-09-10 DIAGNOSIS — M5441 Lumbago with sciatica, right side: Secondary | ICD-10-CM | POA: Insufficient documentation

## 2015-09-10 NOTE — Therapy (Signed)
[facility] [place], [place], [address] Phone: [phone]   Fax:  [phone]  Physical Therapy Evaluation  Patient Details  Name: Derrick Turner MRN: [id number] Date of Birth: 02-Nov-1970 Referring Provider: Consuella Lose MD  Encounter Date: 09/10/2015      PT End of Session - 09/10/15 1456    Visit Number 1   Number of Visits 12   Date for PT Re-Evaluation 10/22/15   PT Start Time 0153   PT Stop Time 0240   PT Time Calculation (min) 47 min   Activity Tolerance Patient tolerated treatment well   Behavior During Therapy [facility] for tasks assessed/performed      No past medical history on file.  Past Surgical History:  Procedure Laterality Date  . APPENDECTOMY    . CHOLECYSTECTOMY    . SHOULDER SURGERY      There were no vitals filed for this visit.       Subjective Assessment - 09/10/15 1419    Subjective The Dr. thinks a bone might be moving in my low back.   Pertinent History 2 previous lumbar surgeries.   Limitations Sitting;Standing   How long can you sit comfortably? 10-15 minutes.   How long can you stand comfortably? 10-15 minutes.   Patient Stated Goals Get out of pain.   Currently in Pain? Yes   Pain Score 8    Pain Location Back   Pain Orientation Right   Pain Descriptors / Indicators Sharp   Pain Type Chronic pain   Pain Onset More than a month ago   Pain Frequency Constant   Aggravating Factors  Prolonged sitting and standing.   Pain Relieving Factors Rest.   Multiple Pain Sites Yes            [facility] PT Assessment - 09/10/15 0001      Assessment   Medical Diagnosis Chronic right sided low back pain without sciatica.   Referring Provider Consuella Lose MD   Onset Date/Surgical Date --  9 months.     Precautions   Precautions None     Restrictions   Weight Bearing Restrictions No     Balance Screen   Has the patient fallen in the past 6 months No   Has the patient  had a decrease in activity level because of a fear of falling?  No   Is the patient reluctant to leave their home because of a fear of falling?  No     Home Ecologist residence     Prior Function   Level of Independence Independent     Posture/Postural Control   Posture/Postural Control No significant limitations     ROM / Strength   AROM / PROM / Strength AROM;Strength     AROM   Overall AROM Comments Active lumbar flexion limited by 50% and extension= 17 degrees.     Strength   Overall Strength Comments Normal bilateral LE strength.     Palpation   Palpation comment Tender to palpation in right low back musculature.  Right buttock pain appears to be referred.     Special Tests    Special Tests --  Absent RT Achiiles DTR; (-) SLR;(-)leg lengths.     Ambulation/Gait   Gait Comments WNL.                   [facility] Adult PT Treatment/Exercise - 09/10/15 0001      Modalities   Modalities  Electrical Stimulation;Moist Heat     Moist Heat Therapy   Number Minutes Moist Heat 15 Minutes   Moist Heat Location --  Low back.     Acupuncturist Location Left low back.   Electrical Stimulation Action Pre-mod   Electrical Stimulation Parameters 80-150 Hz.   Electrical Stimulation Goals Pain                     PT Long Term Goals - 09/10/15 1424      PT LONG TERM GOAL #1   Title Ind with a HEP.   Time 6   Period Weeks   Status New     PT LONG TERM GOAL #2   Title Sit 30 minutes with pain not > 3/10.   Time 6   Period Weeks   Status New     PT LONG TERM GOAL #3   Title Stand 30 minutes with pain not > 3/10.   Time 6   Period Weeks     PT LONG TERM GOAL #4   Title Eliminate right LE pain.   Time 6   Period Weeks   Status New               Plan - 09/10/15 1421    Clinical Impression Statement The patient underwent a lumbar surgery in July of 2016.  He said he was  great for about 3 months but then his pain began to comeback and radiate into his right buttock.  His pain rise to close to a 10/10 after sitting for prolonged periods of time.  Standing also increases her pain.  He has palpable right lowwr lumbar pain and radiation into the right buttock.   Rehab Potential Good   PT Frequency 2x / week   PT Duration 6 weeks   PT Treatment/Interventions ADLs/Self Care Home Management;Electrical Stimulation;Moist Heat;Ultrasound;Patient/family education;Therapeutic activities;Therapeutic exercise;Manual techniques   PT Next Visit Plan Modalities and STW/M to right low back region.  Progress with back stabilization exercises.   Consulted and Agree with Plan of Care Patient      Patient will benefit from skilled therapeutic intervention in order to improve the following deficits and impairments:  Pain, Decreased activity tolerance  Visit Diagnosis: Lumbago with sciatica, right side - Plan: PT plan of care cert/re-cert     Problem List There are no active problems to display for this patient.   Elynor Kallenberger, [place] MPT 09/10/2015, 3:01 PM  [facility] [address] [place], [place], [address] Phone: [phone]   Fax:  [phone]  Name: Derrick Turner MRN: [id number] Date of Birth: 1970/11/24

## 2015-09-17 ENCOUNTER — Ambulatory Visit: Payer: BLUE CROSS/BLUE SHIELD | Admitting: *Deleted

## 2015-09-17 DIAGNOSIS — M5441 Lumbago with sciatica, right side: Secondary | ICD-10-CM | POA: Diagnosis not present

## 2015-09-17 NOTE — Therapy (Signed)
[facility] [address] [place], [place], [address] Phone: [phone]   Fax:  [phone]  Physical Therapy Treatment  Patient Details  Name: Derrick Turner MRN: [id number] Date of Birth: 03/04/1970 Referring Provider: Neelesh Nundkumar MD  Encounter Date: 09/17/2015      PT End of Session - 09/17/15 1800    Visit Number 2   Number of Visits 12   Date for PT Re-Evaluation 10/22/15   PT Start Time 1645   PT Stop Time 1737   PT Time Calculation (min) 52 min      No past medical history on file.  Past Surgical History:  Procedure Laterality Date  . APPENDECTOMY    . CHOLECYSTECTOMY    . SHOULDER SURGERY      There were no vitals filed for this visit.      Subjective Assessment - 09/17/15 1757    Pertinent History 2 previous lumbar surgeries.   Limitations Sitting;Standing   How long can you sit comfortably? 10-15 minutes.   How long can you stand comfortably? 10-15 minutes.   Patient Stated Goals Get out of pain.   Currently in Pain? Yes   Pain Score 6    Pain Location Back   Pain Orientation Right   Pain Descriptors / Indicators Sharp   Pain Type Chronic pain   Pain Onset More than a month ago   Pain Frequency Constant                         [facility] Adult PT Treatment/Exercise - 09/17/15 0001      Modalities   Modalities Electrical Stimulation;Moist Heat;Ultrasound     Moist Heat Therapy   Number Minutes Moist Heat 15 Minutes   Moist Heat Location Lumbar Spine     Electrical Stimulation   Electrical Stimulation Location Left low back. premod 80-150hz, x 15 mins   Electrical Stimulation Goals Pain     Ultrasound   Ultrasound Location RT side LB   Ultrasound Parameters 1.5 w/cm2 x 10 mins in prone   Ultrasound Goals Pain     Manual Therapy   Manual Therapy Soft tissue mobilization;Myofascial release   Soft tissue mobilization STW / IASTW to RT side thoracolumbar paras in prone                      PT Long Term Goals - 09/10/15 1424      PT LONG TERM GOAL #1   Title Ind with a HEP.   Time 6   Period Weeks   Status New     PT LONG TERM GOAL #2   Title Sit 30 minutes with pain not > 3/10.   Time 6   Period Weeks   Status New     PT LONG TERM GOAL #3   Title Stand 30 minutes with pain not > 3/10.   Time 6   Period Weeks     PT LONG TERM GOAL #4   Title Eliminate right LE pain.   Time 6   Period Weeks   Status New               Plan - 09/17/15 1801    Clinical Impression Statement Pt did fairly well today with Rx and had decreased pain before leaving clinic. He had a  normal response to modalities, but had notable tightness in RT side LB paras felt during STW. No goals met today and are   ongoing   Rehab Potential Good   PT Frequency 2x / week   PT Duration 6 weeks   PT Treatment/Interventions ADLs/Self Care Home Management;Electrical Stimulation;Moist Heat;Ultrasound;Patient/family education;Therapeutic activities;Therapeutic exercise;Manual techniques   PT Next Visit Plan Modalities and STW/M to right low back region.  Progress with back stabilization exercises.   Consulted and Agree with Plan of Care Patient      Patient will benefit from skilled therapeutic intervention in order to improve the following deficits and impairments:  Pain, Decreased activity tolerance  Visit Diagnosis: Lumbago with sciatica, right side     Problem List There are no active problems to display for this patient.   RAMSEUR,CHRIS, PTA 09/17/2015, 6:07 PM  [facility] [address] [place], [place], [address] Phone: [phone]   Fax:  [phone]  Name: Derrick Turner MRN: [id number] Date of Birth: 03/03/1970    

## 2015-09-22 ENCOUNTER — Ambulatory Visit: Payer: BLUE CROSS/BLUE SHIELD | Admitting: Physical Therapy

## 2015-09-22 DIAGNOSIS — M5441 Lumbago with sciatica, right side: Secondary | ICD-10-CM | POA: Diagnosis not present

## 2015-09-22 NOTE — Therapy (Signed)
[facility] [place], [place], [address] Phone: [phone]   Fax:  [phone]  Physical Therapy Treatment  Patient Details  Name: Derrick Turner MRN: [id number] Date of Birth: 03/13/1970 Referring Provider: Consuella Lose MD  Encounter Date: 09/22/2015      PT End of Session - 09/22/15 1804    Visit Number 3   Number of Visits 12   Date for PT Re-Evaluation 10/22/15   PT Start Time 0450   PT Stop Time 0550   PT Time Calculation (min) 60 min   Activity Tolerance Patient tolerated treatment well   Behavior During Therapy [facility] for tasks assessed/performed      No past medical history on file.  Past Surgical History:  Procedure Laterality Date  . APPENDECTOMY    . CHOLECYSTECTOMY    . SHOULDER SURGERY      There were no vitals filed for this visit.      Subjective Assessment - 09/22/15 1711    Subjective I felt better after the last treatment but then I took a long trip back and forth to the beach and my pain increased again.   Pain Score 6    Pain Location Back   Pain Orientation Right                         [facility] Adult PT Treatment/Exercise - 09/22/15 0001      Modalities   Modalities Electrical Stimulation;Moist Heat;Ultrasound     Moist Heat Therapy   Number Minutes Moist Heat 20 Minutes   Moist Heat Location Lumbar Spine     Electrical Stimulation   Electrical Stimulation Location Right low back.   Electrical Stimulation Action Pre-mod   Electrical Stimulation Parameters 80-150 Hz.   Electrical Stimulation Goals Pain     Ultrasound   Ultrasound Location RT LB   Ultrasound Parameters 1.50 W/CM2 x 12 minutes left sdly position with pillows between knees for comfort.   Ultrasound Goals Pain     Manual Therapy   Manual Therapy Soft tissue mobilization   Soft tissue mobilization STW/M x 11 minutes.                     PT Long Term Goals - 09/10/15 1424      PT LONG TERM GOAL #1   Title Ind with a HEP.   Time 6   Period Weeks   Status New     PT LONG TERM GOAL #2   Title Sit 30 minutes with pain not > 3/10.   Time 6   Period Weeks   Status New     PT LONG TERM GOAL #3   Title Stand 30 minutes with pain not > 3/10.   Time 6   Period Weeks     PT LONG TERM GOAL #4   Title Eliminate right LE pain.   Time 6   Period Weeks   Status New             Patient will benefit from skilled therapeutic intervention in order to improve the following deficits and impairments:  Pain, Decreased activity tolerance  Visit Diagnosis: Lumbago with sciatica, right side     Problem List There are no active problems to display for this patient.   Hadyn Azer, [place] MPT 09/22/2015, 6:18 PM  [facility] [address] [place], [place], [address] Phone: [phone]   Fax:  [phone]  Name:  DASEAN BOUTELL MRN: [id number] Date of Birth: 1970/10/27

## 2015-09-25 ENCOUNTER — Ambulatory Visit: Payer: BLUE CROSS/BLUE SHIELD | Admitting: Physical Therapy

## 2015-09-25 ENCOUNTER — Encounter: Payer: Self-pay | Admitting: Physical Therapy

## 2015-09-25 DIAGNOSIS — M5441 Lumbago with sciatica, right side: Secondary | ICD-10-CM | POA: Diagnosis not present

## 2015-09-25 NOTE — Patient Instructions (Addendum)
Stretching: Piriformis (Supine)    Pull right knee toward opposite shoulder. Hold __30__ seconds. Relax. Repeat _3___ times per set. Do ____ sets per session. Do __2-3__ sessions per day.  http://orth.exer.us/712   Copyright  VHI. All rights reserved.  Knee-to-Chest Stretch: Unilateral    With hand behind right knee, pull knee in to chest until a comfortable stretch is felt in lower back and buttocks. Keep back relaxed. Hold __30__ seconds. Repeat _3___ times per set. Do ____ sets per session. Do __2-3__ sessions per day.  http://orth.exer.us/126   Copyright  VHI. All rights reserved.

## 2015-09-25 NOTE — Therapy (Signed)
[facility] [place], [place], [address] Phone: [phone]   Fax:  [phone]  Physical Therapy Treatment  Patient Details  Name: Derrick Turner MRN: [id number] Date of Birth: 06/13/1970 Referring Provider: Consuella Lose MD  Encounter Date: 09/25/2015      PT End of Session - 09/25/15 0817    Visit Number 4   Number of Visits 12   Date for PT Re-Evaluation 10/22/15   PT Start Time 0817   PT Stop Time 0903   PT Time Calculation (min) 46 min   Activity Tolerance Patient tolerated treatment well   Behavior During Therapy [facility] for tasks assessed/performed      No past medical history on file.  Past Surgical History:  Procedure Laterality Date  . APPENDECTOMY    . CHOLECYSTECTOMY    . SHOULDER SURGERY      There were no vitals filed for this visit.      Subjective Assessment - 09/25/15 0817    Subjective Reports that he has been riding since 5 am and is hurting in low back.   Pertinent History 2 previous lumbar surgeries.   Limitations Sitting;Standing   How long can you sit comfortably? 10-15 minutes.   How long can you stand comfortably? 10-15 minutes.   Patient Stated Goals Get out of pain.   Currently in Pain? Yes   Pain Score 7    Pain Location Back   Pain Orientation Right;Lower   Pain Descriptors / Indicators Constant;Aching   Pain Type Chronic pain   Pain Onset More than a month ago   Pain Frequency Constant            [facility] PT Assessment - 09/25/15 0001      Assessment   Medical Diagnosis Chronic right sided low back pain without sciatica.   Next MD Visit 10/14/2015     Precautions   Precautions None     Restrictions   Weight Bearing Restrictions No                     [facility] Adult PT Treatment/Exercise - 09/25/15 0001      Modalities   Modalities Electrical Stimulation;Moist Heat;Ultrasound     Moist Heat Therapy   Number Minutes Moist Heat 15 Minutes   Moist  Heat Location Lumbar Spine     Electrical Stimulation   Electrical Stimulation Location R lumbar paraspinals   Electrical Stimulation Action Pre-mod   Electrical Stimulation Parameters 80-150 hz x15 min   Electrical Stimulation Goals Pain     Ultrasound   Ultrasound Location R low back   Ultrasound Parameters 1.5 w/cm2, 100%, 1 mhzx10 min   Ultrasound Goals Pain     Manual Therapy   Soft tissue mobilization MFR/STW to R lumbar paraspinals/ QL/ upper Glute to decrease pain and tightness in L sidelying                PT Education - 09/25/15 0856    Education provided Yes   Education Details HEP- SKTC, Piriformis stretch   Person(s) Educated Patient   Methods Explanation;Demonstration;Verbal cues;Handout   Comprehension Verbalized understanding;Returned demonstration;Verbal cues required             PT Long Term Goals - 09/10/15 1424      PT LONG TERM GOAL #1   Title Ind with a HEP.   Time 6   Period Weeks   Status New     PT LONG TERM GOAL #2  Title Sit 30 minutes with pain not > 3/10.   Time 6   Period Weeks   Status New     PT LONG TERM GOAL #3   Title Stand 30 minutes with pain not > 3/10.   Time 6   Period Weeks     PT LONG TERM GOAL #4   Title Eliminate right LE pain.   Time 6   Period Weeks   Status New               Plan - 09/25/15 0854    Clinical Impression Statement Patient arrived to clinic with increased R low back pain and into R buttock from driving for an extended time already today. Patient presented in an area of increased tightness in R lumbar parspinals that patient noted some discomfort with manual therapy to that region. Patient was educated regarding new low back/ buttock stretches and the parameters regarding the HEP with patient verbalizing understanding. Normal modaliites response noted following removal of the modalities. Patient experienced R low back feeling "much better" following treatment than he did prior to  treatment.   Rehab Potential Good   PT Frequency 2x / week   PT Duration 6 weeks   PT Treatment/Interventions ADLs/Self Care Home Management;Electrical Stimulation;Moist Heat;Ultrasound;Patient/family education;Therapeutic activities;Therapeutic exercise;Manual techniques   PT Next Visit Plan Modalities and STW/M to right low back region.  Progress with back stabilization exercises.   Consulted and Agree with Plan of Care Patient      Patient will benefit from skilled therapeutic intervention in order to improve the following deficits and impairments:  Pain, Decreased activity tolerance  Visit Diagnosis: Lumbago with sciatica, right side     Problem List There are no active problems to display for this patient.   Wynelle Fanny, PTA 09/25/2015, 9:12 AM  [facility] [address] [place], [place], [address] Phone: [phone]   Fax:  [phone]  Name: Derrick Turner MRN: [id number] Date of Birth: 1970-10-25

## 2015-09-29 ENCOUNTER — Ambulatory Visit: Payer: BLUE CROSS/BLUE SHIELD | Admitting: *Deleted

## 2015-09-29 DIAGNOSIS — M5441 Lumbago with sciatica, right side: Secondary | ICD-10-CM | POA: Diagnosis not present

## 2015-09-29 NOTE — Therapy (Signed)
[facility] [place], [place], [address] Phone: [phone]   Fax:  [phone]  Physical Therapy Treatment  Patient Details  Name: Derrick Turner MRN: [id number] Date of Birth: 12-Oct-1970 Referring Provider: Consuella Lose MD  Encounter Date: 09/29/2015      PT End of Session - 09/29/15 1742    Visit Number 5   Number of Visits 12   Date for PT Re-Evaluation 10/22/15   PT Start Time [id number]   PT Stop Time 1744   PT Time Calculation (min) 51 min      No past medical history on file.  Past Surgical History:  Procedure Laterality Date  . APPENDECTOMY    . CHOLECYSTECTOMY    . SHOULDER SURGERY      There were no vitals filed for this visit.      Subjective Assessment - 09/29/15 1658    Subjective My RT side LB and glute have been hurting since last Thursday 8/10   Pertinent History 2 previous lumbar surgeries.   Limitations Sitting;Standing   How long can you sit comfortably? 10-15 minutes.   How long can you stand comfortably? 10-15 minutes.   Patient Stated Goals Get out of pain.   Currently in Pain? Yes   Pain Score 8    Pain Frequency Constant                         [facility] Adult PT Treatment/Exercise - 09/29/15 0001      Electrical Stimulation   Electrical Stimulation Location R lumbar paraspinals and SIJ Premod x 15 mins 80-150 hz   Electrical Stimulation Goals Pain     Ultrasound   Ultrasound Location RT LB paras into SIJ   Ultrasound Parameters 1.5 w/cm2 x 10 mins 3.3 Mhz   Ultrasound Goals Pain     Manual Therapy   Manual Therapy Soft tissue mobilization   Soft tissue mobilization MFR/STW to R lumbar paraspinals/ QL/ SIJ  to decrease pain and tightness in L sidelying                     PT Long Term Goals - 09/10/15 1424      PT LONG TERM GOAL #1   Title Ind with a HEP.   Time 6   Period Weeks   Status New     PT LONG TERM GOAL #2   Title Sit 30  minutes with pain not > 3/10.   Time 6   Period Weeks   Status New     PT LONG TERM GOAL #3   Title Stand 30 minutes with pain not > 3/10.   Time 6   Period Weeks     PT LONG TERM GOAL #4   Title Eliminate right LE pain.   Time 6   Period Weeks   Status New               Plan - 09/29/15 1745    Clinical Impression Statement Pt did fair today with Rx, but has increased pain over the last 3 days on RT side LB and into SIJ area. He did better after Rx today with decreased pain down to 5/10. Unable to meet any goals today  due to increased pain   Rehab Potential Good   PT Frequency 2x / week   PT Duration 6 weeks   PT Treatment/Interventions ADLs/Self Care Home Management;Electrical Stimulation;Moist Heat;Ultrasound;Patient/family education;Therapeutic activities;Therapeutic exercise;Manual techniques  PT Next Visit Plan Modalities and STW/M to right low back region.  Progress with back stabilization exercises.   Consulted and Agree with Plan of Care Patient      Patient will benefit from skilled therapeutic intervention in order to improve the following deficits and impairments:  Pain, Decreased activity tolerance  Visit Diagnosis: Lumbago with sciatica, right side     Problem List There are no active problems to display for this patient.   Romen Yutzy,CHRIS, PTA 09/29/2015, 6:08 PM  [facility] [place], [place], [address] Phone: [phone]   Fax:  [phone]  Name: Derrick Turner MRN: [id number] Date of Birth: 26-Dec-1970

## 2015-10-02 ENCOUNTER — Ambulatory Visit: Payer: BLUE CROSS/BLUE SHIELD | Admitting: *Deleted

## 2015-10-02 DIAGNOSIS — M5441 Lumbago with sciatica, right side: Secondary | ICD-10-CM | POA: Diagnosis not present

## 2015-10-02 NOTE — Therapy (Addendum)
[facility] [place], [place], [address] Phone: [phone]   Fax:  [phone]  Physical Therapy Treatment  Patient Details  Name: Derrick Turner MRN: [id number] Date of Birth: [birth year] Referring Provider: Consuella Lose MD  Encounter Date: 10/02/2015      PT End of Session - 10/02/15 0818    Visit Number 6   Number of Visits 12   Date for PT Re-Evaluation 10/22/15   PT Start Time 0817   PT Stop Time 0905   PT Time Calculation (min) 48 min      No past medical history on file.  Past Surgical History:  Procedure Laterality Date  . APPENDECTOMY    . CHOLECYSTECTOMY    . SHOULDER SURGERY      There were no vitals filed for this visit.      Subjective Assessment - 10/02/15 0818    Subjective Doing better today with less pain LB   Pertinent History 2 previous lumbar surgeries.   Limitations Sitting;Standing   How long can you sit comfortably? 10-15 minutes.   How long can you stand comfortably? 10-15 minutes.   Patient Stated Goals Get out of pain.   Currently in Pain? Yes   Pain Score 4    Pain Location Back   Pain Orientation Right;Lower   Pain Descriptors / Indicators Constant;Aching   Pain Type Chronic pain   Pain Onset More than a month ago   Pain Frequency Constant                         [facility] Adult PT Treatment/Exercise - 10/02/15 0001      Exercises   Exercises Lumbar     Lumbar Exercises: Supine   Ab Set 15 reps;3 seconds  Draw-in   Bent Knee Raise 10 reps  marching   Bridge 10 reps;3 seconds     Modalities   Modalities Electrical Stimulation;Moist Heat;Ultrasound     Moist Heat Therapy   Number Minutes Moist Heat 15 Minutes   Moist Heat Location Lumbar Spine     Electrical Stimulation   Electrical Stimulation Location R lumbar paraspinals and SIJ Premod x 15 mins 80-150 hz   Electrical Stimulation Goals Pain    Manual   STW to RT LB paras and into  glute            PT Education - 10/02/15 0823    Education provided Yes   Education Details Draw-in core activation exs   Person(s) Educated Patient   Methods Explanation;Demonstration;Tactile cues;Verbal cues;Handout   Comprehension Verbalized understanding;Returned demonstration             PT Long Term Goals - 10/02/15 0908      PT LONG TERM GOAL #1   Title Ind with a HEP.   Time 6   Period Weeks   Status Partially Met     PT LONG TERM GOAL #2   Title Sit 30 minutes with pain not > 3/10.   Time 6   Period Weeks     PT LONG TERM GOAL #3   Title Stand 30 minutes with pain not > 3/10.   Time 6   Period Weeks     PT LONG TERM GOAL #4   Title Eliminate right LE pain.   Time 6   Period Weeks   Status On-going               Plan - 10/02/15 8403  Clinical Impression Statement Pt arrived to clinic today with less pain (4/10) today and with a normal gait pattern. He was able to start core activation Ex.'s today and did well. Current goals are ongoing due to pain   Rehab Potential Good   PT Frequency 2x / week   PT Duration 6 weeks   PT Treatment/Interventions ADLs/Self Care Home Management;Electrical Stimulation;Moist Heat;Ultrasound;Patient/family education;Therapeutic activities;Therapeutic exercise;Manual techniques   PT Next Visit Plan Modalities and STW/M to right low back region.  Progress with back stabilization exercises.   Consulted and Agree with Plan of Care Patient      Patient will benefit from skilled therapeutic intervention in order to improve the following deficits and impairments:  Pain, Decreased activity tolerance  Visit Diagnosis: Lumbago with sciatica, right side     Problem List There are no active problems to display for this patient.   Jguadalupe Opiela,CHRIS , PTA [facility] [place], [place], [address] Phone: [phone]   Fax:  [phone]  Name: ROXIE GUEYE MRN: [id number] Date of Birth: 19-Aug-1970

## 2015-10-02 NOTE — Patient Instructions (Signed)
Isometric Abdominal   Lying on back with knees bent, tighten stomach by pulling navel down. Hold ____ seconds. Repeat __10__ times per set. Do __5__ sets per session. Do _3-5___ sessions per day.  http://orth.exer.us/1086   Copyright  VHI. All rights reserved.  Bent Leg Lift (Hook-Lying)   Tighten stomach and slowly raise right leg _6___ inches from floor. Keep trunk rigid. Hold _2-3___ seconds. Repeat _10___ times per set. Do _3___ sets per session. Do _2-3___ sessions per day.  http://orth.exer.us/1090   Copyright  VHI. All rights reserved.  Bridging   Slowly raise buttocks from floor, keeping stomach tight. Repeat __10__ times per set. Do _3___ sets per session. Do __2-3__ sessions per day.  http://orth.exer.us/1096   Copyright  VHI. All rights reserved.

## 2015-10-05 ENCOUNTER — Ambulatory Visit: Payer: BLUE CROSS/BLUE SHIELD | Admitting: *Deleted

## 2015-10-05 DIAGNOSIS — M5441 Lumbago with sciatica, right side: Secondary | ICD-10-CM | POA: Diagnosis not present

## 2015-10-05 NOTE — Therapy (Signed)
[facility] [place], [place], [address] Phone: [phone]   Fax:  [phone]  Physical Therapy Treatment  Patient Details  Name: Derrick Turner MRN: [id number] Date of Birth: 1970/07/08 Referring Provider: Consuella Lose MD  Encounter Date: 10/05/2015      PT End of Session - 10/05/15 1603    Visit Number 7   Number of Visits 12   Date for PT Re-Evaluation 10/22/15   PT Start Time 1600   PT Stop Time 1650   PT Time Calculation (min) 50 min      No past medical history on file.  Past Surgical History:  Procedure Laterality Date  . APPENDECTOMY    . CHOLECYSTECTOMY    . SHOULDER SURGERY      There were no vitals filed for this visit.      Subjective Assessment - 10/05/15 1601    Subjective Doing better today with less pain LB, RT buttocks in sitting 7/10 and standing 5/10   Pertinent History 2 previous lumbar surgeries.   Limitations Sitting;Standing   How long can you sit comfortably? 10-15 minutes.   How long can you stand comfortably? 10-15 minutes.   Currently in Pain? Yes   Pain Score 5    Pain Location Back   Pain Orientation Right;Lower   Pain Descriptors / Indicators Constant;Aching   Pain Onset More than a month ago   Pain Frequency Intermittent                         [facility] Adult PT Treatment/Exercise - 10/05/15 0001      Exercises   Exercises Lumbar     Lumbar Exercises: Stretches   Piriformis Stretch 3 reps;30 seconds  RT side     Lumbar Exercises: Supine   Ab Set 5 reps;5 seconds  Draw-in   Bent Knee Raise 10 reps   Bridge 10 reps;3 seconds     Modalities   Modalities Electrical Stimulation;Moist Heat;Ultrasound     Moist Heat Therapy   Number Minutes Moist Heat 15 Minutes   Moist Heat Location Lumbar Spine     Electrical Stimulation   Electrical Stimulation Location R lumbar paraspinals and SIJ Premod x 15 mins 80-150 hz   Electrical Stimulation  Goals Pain     Ultrasound   Ultrasound Location RT piriformis   Ultrasound Parameters 1.5 w/cm2 x 10 mins in LT sidelying   Ultrasound Goals Pain     Manual Therapy   Manual Therapy --   Soft tissue mobilization --                     PT Long Term Goals - 10/02/15 0908      PT LONG TERM GOAL #1   Title Ind with a HEP.   Time 6   Period Weeks   Status Partially Met     PT LONG TERM GOAL #2   Title Sit 30 minutes with pain not > 3/10.   Time 6   Period Weeks     PT LONG TERM GOAL #3   Title Stand 30 minutes with pain not > 3/10.   Time 6   Period Weeks     PT LONG TERM GOAL #4   Title Eliminate right LE pain.   Time 6   Period Weeks   Status On-going               Plan - 10/05/15 2778  Clinical Impression Statement Pt doing a little better overall today. His CC is sitting for prolonged periods. He was mainly sore in RT glute and piriformis today from sitting a lot. [place] was performed to RT piriformis after exs  as well as heat and Estim. Pt responded well and had some relief after Rx. Goals are ongoing   Rehab Potential Good   PT Frequency 2x / week   PT Duration 6 weeks   PT Treatment/Interventions ADLs/Self Care Home Management;Electrical Stimulation;Moist Heat;Ultrasound;Patient/family education;Therapeutic activities;Therapeutic exercise;Manual techniques   PT Next Visit Plan Modalities and STW/M to right low back region.  Progress with back stabilization exercises. MD note next Rx.  To MD next week      Patient will benefit from skilled therapeutic intervention in order to improve the following deficits and impairments:  Pain, Decreased activity tolerance  Visit Diagnosis: Lumbago with sciatica, right side     Problem List There are no active problems to display for this patient.   Reve Crocket,CHRIS, PTA 10/05/2015, 5:24 PM  [facility] [address] [place], [place], [address] Phone:  [phone]   Fax:  [phone]  Name: Derrick Turner MRN: [id number] Date of Birth: 07-Mar-1970

## 2015-10-07 ENCOUNTER — Encounter: Payer: Self-pay | Admitting: Physical Therapy

## 2015-10-07 ENCOUNTER — Ambulatory Visit: Payer: BLUE CROSS/BLUE SHIELD | Admitting: Physical Therapy

## 2015-10-07 DIAGNOSIS — M5441 Lumbago with sciatica, right side: Secondary | ICD-10-CM | POA: Diagnosis not present

## 2015-10-07 NOTE — Therapy (Addendum)
[facility] [place], [place], [address] Phone: [phone]   Fax:  [phone]  Physical Therapy Treatment  Patient Details  Name: Derrick Turner MRN: [id number] Date of Birth: 07/11/1970 Referring Provider: Consuella Lose MD  Encounter Date: 10/07/2015      PT End of Session - 10/07/15 1502    Visit Number 8   Number of Visits 12   Date for PT Re-Evaluation 10/22/15   PT Start Time 2536   PT Stop Time 1529   PT Time Calculation (min) 36 min   Activity Tolerance Patient tolerated treatment well   Behavior During Therapy [facility] for tasks assessed/performed      History reviewed. No pertinent past medical history.  Past Surgical History:  Procedure Laterality Date  . APPENDECTOMY    . CHOLECYSTECTOMY    . SHOULDER SURGERY      There were no vitals filed for this visit.      Subjective Assessment - 10/07/15 1454    Subjective Patient arrived with more pain due to a lot of sitting in car   Pertinent History 2 previous lumbar surgeries.   Limitations Sitting;Standing   How long can you sit comfortably? 10-15 minutes.   How long can you stand comfortably? 10-15 minutes.   Patient Stated Goals Get out of pain.   Currently in Pain? Yes   Pain Score 8    Pain Location Back   Pain Orientation Right;Lower   Pain Descriptors / Indicators Constant;Aching   Pain Type Chronic pain   Pain Onset More than a month ago   Pain Frequency Intermittent   Aggravating Factors  prolong sitting   Pain Relieving Factors at rest                         [facility] Adult PT Treatment/Exercise - 10/07/15 0001      Moist Heat Therapy   Number Minutes Moist Heat 10 Minutes   Moist Heat Location Lumbar Spine     Electrical Stimulation   Electrical Stimulation Location right lumbar    Electrical Stimulation Action premod   Electrical Stimulation Parameters 80-_0  x2mn   Electrical Stimulation Goals Pain     Ultrasound   Ultrasound Location right lumbar   Ultrasound Parameters 1.5w/cm2/50%/138m x10 min   Ultrasound Goals Pain     Manual Therapy   Manual Therapy Soft tissue mobilization   Soft tissue mobilization MFR/STW to R lumbar paraspinals/ QL/ SIJ  to decrease pain and tightness in L sidelying                     PT Long Term Goals - 10/02/15 0908      PT LONG TERM GOAL #1   Title Ind with a HEP.   Time 6   Period Weeks   Status Partially Met     PT LONG TERM GOAL #2   Title Sit 30 minutes with pain not > 3/10.   Time 6   Period Weeks     PT LONG TERM GOAL #3   Title Stand 30 minutes with pain not > 3/10.   Time 6   Period Weeks     PT LONG TERM GOAL #4   Title Eliminate right LE pain.   Time 6   Period Weeks   Status On-going               Plan - 10/07/15 1518    Clinical Impression  Statement Patient progressing slowly due to an increase of pain today after a lot of sitting in the car. Patient tolerated treatment well today and had less pain post treatment. Educated patient on posture techniques and core activation at all times to protect back and prevent re-injury. Patient goals ongoing at this time due to apin deficts.    Rehab Potential Good   PT Frequency 2x / week   PT Duration 6 weeks   PT Treatment/Interventions ADLs/Self Care Home Management;Electrical Stimulation;Moist Heat;Ultrasound;Patient/family education;Therapeutic activities;Therapeutic exercise;Manual techniques   PT Next Visit Plan Modalities and STW/M to right low back region.  Progress with back stabilization exercises. MD note next Rx.  To MD next week appt 10/14/15   Consulted and Agree with Plan of Care Patient      Patient will benefit from skilled therapeutic intervention in order to improve the following deficits and impairments:  Pain, Decreased activity tolerance  Visit Diagnosis: Lumbago with sciatica, right side     Problem List There are no active problems to  display for this patient.   Phillips Climes, PTA 10/07/2015, 3:29 PM  [facility] [place], [place], [address] Phone: [phone]   Fax:  [phone]  Name: Derrick Turner MRN: [id number] Date of Birth: 1970/05/08  PHYSICAL THERAPY DISCHARGE SUMMARY  Visits from Start of Care: 8.  Current functional level related to goals / functional outcomes: See above.   Remaining deficits: Continued pain.   Education / Equipment: HEP. Plan: Patient agrees to discharge.  Patient goals were partially met. Patient is being discharged due to not returning since the last visit.  ?????          Mali Applegate MPT

## 2015-10-15 ENCOUNTER — Ambulatory Visit: Payer: BLUE CROSS/BLUE SHIELD | Attending: Neurosurgery | Admitting: *Deleted

## 2015-12-21 DIAGNOSIS — R3911 Hesitancy of micturition: Secondary | ICD-10-CM | POA: Insufficient documentation

## 2016-01-14 DIAGNOSIS — M6289 Other specified disorders of muscle: Secondary | ICD-10-CM | POA: Insufficient documentation

## 2016-03-31 ENCOUNTER — Observation Stay (HOSPITAL_COMMUNITY)
Admission: EM | Admit: 2016-03-31 | Discharge: 2016-04-01 | Disposition: A | Discharge status: Home / Self Care | Payer: BLUE CROSS/BLUE SHIELD | Attending: Family Medicine | Admitting: Family Medicine

## 2016-03-31 ENCOUNTER — Emergency Department (HOSPITAL_COMMUNITY): Payer: BLUE CROSS/BLUE SHIELD

## 2016-03-31 ENCOUNTER — Encounter (HOSPITAL_COMMUNITY): Payer: Self-pay

## 2016-03-31 DIAGNOSIS — R7303 Prediabetes: Secondary | ICD-10-CM

## 2016-03-31 DIAGNOSIS — R079 Chest pain, unspecified: Secondary | ICD-10-CM | POA: Diagnosis present

## 2016-03-31 DIAGNOSIS — R0789 Other chest pain: Principal | ICD-10-CM | POA: Insufficient documentation

## 2016-03-31 DIAGNOSIS — R0602 Shortness of breath: Secondary | ICD-10-CM | POA: Diagnosis not present

## 2016-03-31 DIAGNOSIS — K219 Gastro-esophageal reflux disease without esophagitis: Secondary | ICD-10-CM

## 2016-03-31 DIAGNOSIS — I249 Acute ischemic heart disease, unspecified: Secondary | ICD-10-CM | POA: Diagnosis present

## 2016-03-31 DIAGNOSIS — F17200 Nicotine dependence, unspecified, uncomplicated: Secondary | ICD-10-CM | POA: Insufficient documentation

## 2016-03-31 DIAGNOSIS — Z720 Tobacco use: Secondary | ICD-10-CM

## 2016-03-31 HISTORY — DX: Personal history of urinary calculi: Z87.442

## 2016-03-31 LAB — BASIC METABOLIC PANEL WITH GFR
Anion gap: 9 (ref 5–15)
BUN: 13 mg/dL (ref 6–20)
CO2: 24 mmol/L (ref 22–32)
Calcium: 9 mg/dL (ref 8.9–10.3)
Chloride: 106 mmol/L (ref 101–111)
Creatinine, Ser: 0.78 mg/dL (ref 0.61–1.24)
GFR calc Af Amer: >60 mL/min
GFR calc non Af Amer: >60 mL/min
Glucose, Bld: 98 mg/dL (ref 65–99)
Potassium: 4.3 mmol/L (ref 3.5–5.1)
Sodium: 139 mmol/L (ref 135–145)

## 2016-03-31 LAB — HEPATIC FUNCTION PANEL
ALBUMIN: 4.1 g/dL (ref 3.5–5.0)
ALT: 21 U/L (ref 17–63)
AST: 21 U/L (ref 15–41)
Alkaline Phosphatase: 72 U/L (ref 38–126)
BILIRUBIN INDIRECT: 0.2 mg/dL — AB (ref 0.3–0.9)
Bilirubin, Direct: 0.2 mg/dL (ref 0.1–0.5)
TOTAL PROTEIN: 6.8 g/dL (ref 6.5–8.1)
Total Bilirubin: 0.4 mg/dL (ref 0.3–1.2)

## 2016-03-31 LAB — TROPONIN I: Troponin I: <0.03 ng/mL (ref <0.03)

## 2016-03-31 LAB — LIPASE, BLOOD: Lipase: 20 U/L (ref 11–51)

## 2016-03-31 LAB — CBC
HEMATOCRIT: 46.6 % (ref 39.0–52.0)
Hemoglobin: 15.5 g/dL (ref 13.0–17.0)
MCH: 28.6 pg (ref 26.0–34.0)
MCHC: 33.3 g/dL (ref 30.0–36.0)
MCV: 86 fL (ref 78.0–100.0)
Platelets: 172 10*3/uL (ref 150–400)
RBC: 5.42 MIL/uL (ref 4.22–5.81)
RDW: 13.8 % (ref 11.5–15.5)
WBC: 9.3 10*3/uL (ref 4.0–10.5)

## 2016-03-31 LAB — MAGNESIUM: Magnesium: 1.8 mg/dL (ref 1.7–2.4)

## 2016-03-31 LAB — TSH: TSH: 1.333 u[IU]/mL (ref 0.350–4.500)

## 2016-03-31 LAB — I-STAT TROPONIN, ED: Troponin i, poc: 0.01 ng/mL (ref 0.00–0.08)

## 2016-03-31 LAB — BRAIN NATRIURETIC PEPTIDE: B Natriuretic Peptide: 39.9 pg/mL (ref 0.0–100.0)

## 2016-03-31 MED ORDER — SODIUM CHLORIDE 0.9 % IV SOLN
250.0000 mL | INTRAVENOUS | Status: DC | PRN
Start: 2016-03-31 — End: 2016-04-01

## 2016-03-31 MED ORDER — SODIUM CHLORIDE 0.9% FLUSH
3.0000 mL | Freq: Two times a day (BID) | INTRAVENOUS | Status: DC
  Administered 2016-03-31 – 2016-04-01 (×2): 3 mL via INTRAVENOUS

## 2016-03-31 MED ORDER — OXYCODONE HCL 5 MG PO TABS
5.0000 mg | ORAL_TABLET | ORAL | Status: DC | PRN
  Administered 2016-03-31 – 2016-04-01 (×2): 5 mg via ORAL
  Filled 2016-03-31 (×2): qty 1

## 2016-03-31 MED ORDER — ASPIRIN EC 81 MG PO TBEC
81.0000 mg | DELAYED_RELEASE_TABLET | Freq: Every day | ORAL | Status: DC
  Administered 2016-04-01: 81 mg via ORAL
  Filled 2016-03-31: qty 1

## 2016-03-31 MED ORDER — ASPIRIN 81 MG PO CHEW
324.0000 mg | CHEWABLE_TABLET | Freq: Once | ORAL | Status: AC
  Administered 2016-03-31: 324 mg via ORAL
  Filled 2016-03-31: qty 4

## 2016-03-31 MED ORDER — ASPIRIN 81 MG PO CHEW: 324.0000 mg | CHEWABLE_TABLET | ORAL | Status: DC

## 2016-03-31 MED ORDER — MORPHINE SULFATE (PF) 4 MG/ML IV SOLN
4.0000 mg | Freq: Once | INTRAVENOUS | Status: AC
  Administered 2016-03-31: 4 mg via INTRAVENOUS
  Filled 2016-03-31: qty 1

## 2016-03-31 MED ORDER — ACETAMINOPHEN 325 MG PO TABS: 650.0000 mg | ORAL_TABLET | ORAL | Status: DC | PRN

## 2016-03-31 MED ORDER — ASPIRIN 300 MG RE SUPP: 300.0000 mg | RECTAL | Status: DC

## 2016-03-31 MED ORDER — NITROGLYCERIN 0.4 MG SL SUBL
0.4000 mg | SUBLINGUAL_TABLET | SUBLINGUAL | Status: DC | PRN
  Administered 2016-03-31: 0.4 mg via SUBLINGUAL
  Filled 2016-03-31: qty 1

## 2016-03-31 MED ORDER — ONDANSETRON HCL 4 MG/2ML IJ SOLN: 4.0000 mg | Freq: Four times a day (QID) | INTRAMUSCULAR | Status: DC | PRN

## 2016-03-31 MED ORDER — ENOXAPARIN SODIUM 40 MG/0.4ML ~~LOC~~ SOLN
40.0000 mg | SUBCUTANEOUS | Status: DC
  Administered 2016-03-31: 40 mg via SUBCUTANEOUS
  Filled 2016-03-31: qty 0.4

## 2016-03-31 MED ORDER — PANTOPRAZOLE SODIUM 40 MG PO TBEC
40.0000 mg | DELAYED_RELEASE_TABLET | Freq: Every day | ORAL | Status: DC
  Administered 2016-03-31 – 2016-04-01 (×2): 40 mg via ORAL
  Filled 2016-03-31 (×2): qty 1

## 2016-03-31 MED ORDER — NITROGLYCERIN 0.4 MG SL SUBL
0.4000 mg | SUBLINGUAL_TABLET | SUBLINGUAL | Status: DC | PRN
  Administered 2016-03-31 (×2): 0.4 mg via SUBLINGUAL
  Filled 2016-03-31: qty 1

## 2016-03-31 MED ORDER — SODIUM CHLORIDE 0.9% FLUSH: 3.0000 mL | INTRAVENOUS | Status: DC | PRN

## 2016-03-31 MED ORDER — IPRATROPIUM-ALBUTEROL 0.5-2.5 (3) MG/3ML IN SOLN
3.0000 mL | Freq: Once | RESPIRATORY_TRACT | Status: AC
  Administered 2016-03-31: 3 mL via RESPIRATORY_TRACT
  Filled 2016-03-31: qty 3

## 2016-03-31 NOTE — Progress Notes (Signed)
CCMD notified RN at [facility] that pt had 2.26 pause. RN checked on patient. Patient A&Ox4 and denies any symptoms except same 3/10 mid sternal chest pain. Will continue to monitor.

## 2016-03-31 NOTE — H&P (Signed)
[facility] Admission History and Physical Service Pager: [phone]  Patient name: Derrick Turner [facility] Medical record number: [id number] Date of birth: Oct 04, 1970 Age: 46 y.o. Gender: male  Primary Care Provider: Deloria Lair, MD Consultants: None  Code Status: Full  Chief Complaint: Chest pressure  Assessment and Plan: Derrick Turner is a 46 y.o. male presenting with chest pressure. PMH is significant for bilateral shoulder surgeries, seasonal allergies, tobacco use  Chest Pain, new: Exertional chest pressure with diaphoresis, tobacco use, and family history consistent with possible cardiac etiology. Heart score 4. CXR negative for edema or consolidation. Initial EKG normal sinus rhythm, T-wave inversions in aVR, lead 1, No ST-T elevations. Troponin negative in the ED. Vitals stable, patient well-appearing. New CHF less likely with no PND, orthopnea, lower extremity edema. New COPD considered with smoking history and chronic cough however chest x-ray negative. PE and dissection very unlikely given stable vital signs and lack of respiratory symptoms - Admit to FPTS (observation) attending Dr. Erin Turner - monitor on telemetry - trend troponin - repeat EKG in AM - Cardiac echo to evaluate for wall-motion abnormalities - tylenol PRN pain - vitals per unit - Risk stratify: TSH, Lipid panel, HbA1c  Tobacco Abuse, stable - 30 pack/year smoking history. - cessation counseling - nicotine patch PRN  Seasonal Allergies - can add back home zyrtec as needed  FEN/GI: Saline lock IV, cardiac diet Prophylaxis: Lovenox  Disposition: Admit to family medicine, d/c pending clinical improvement and cardiac workup  History of Present Illness: Derrick Turner is a 46 y.o. male presenting with chest pressure. Patient was in his usual state of health until 2 days ago when he noticed intermittent midsternal chest pressure that radiates around the right shoulder and down  his right arm. The discomfort became constant last night. Discomfort is worse with exertion and better with rest. Pain is not worse with eating. He does note that he has had nausea worse with eating for the last week, and feels he has been more diaphoretic lately although does not associate this with chest discomfort. He denies PND, no orthopnea. No lower external edema. No vomiting, no diarrhea, no constipation, no night sweats or recent weight loss, no blurry vision, no urinary frequency or hematuria.  ED I-STAT troponin negative, EKG not concerning for acute infarct. Admit as telemetry for observation and workup overnight.  Review Of Systems: Per HPI. Otherwise 12 point review of systems was performed and was unremarkable.  Patient Active Problem List   Diagnosis Date Noted  . Chest pain 03/31/2016   Past Medical History: History reviewed. No pertinent past medical history. Past Surgical History: Past Surgical History:  Procedure Laterality Date  . APPENDECTOMY    . CHOLECYSTECTOMY    . SHOULDER SURGERY     Social History: Social History  Substance Use Topics  . Smoking status: Current Every Day Smoker  . Smokeless tobacco: Never Used  . Alcohol use Yes   Additional social history:  1-1.5 PPD smoker x30 yrs 1 drink per week - beer or [place] Occasional marijuana Lives with wife Works as Clinical biochemist  Family History: No family history on file.  MGF, PGF - MIs Dad with MI in 20s  Allergies and Medications: No Known Allergies No current facility-administered medications on file prior to encounter.    Current Outpatient Prescriptions on File Prior to Encounter  Medication Sig Dispense Refill  . cetirizine (ZYRTEC) 10 MG tablet Take 10 mg by mouth daily.  Objective: BP 130/95   Pulse 70   Temp 98.2 F (36.8 C) (Oral)   Resp 19   Ht 5\' 8"  (1.727 m)   Wt 235 lb (106.6 kg)   SpO2 97%   BMI 35.73 kg/m  Exam: General: NAD, rests comfortably in bed,  pleasant HEENT: [place]/AT, PERRL, EOMI, no scleral icterus, no conjunctival pallor or injection Cardiovascular: RRR, no murmurs rubs or gallops, no chest wall tenderness Respiratory: Clear to auscultation bilaterally, no W/R/R, comfortable work of breathing, good effort Abdomen: Soft and nontender, nondistended, normoactive bowel sounds, no hepatosplenomegaly Extremities: no lower extremity edema, moves 4 extremities equally Skin: Warm and well perfused, no rashes or lesions MSK: No chest tenderness to palpation Neuro: CN II-XII Grossly intact  Labs and Imaging: EKG normal sinus rhythm, T-wave inversions in aVR, lead 1, No ST-T elevations Troponin - negative x1   CBC BMET   Recent Labs Lab 03/31/16 1112  WBC 9.3  HGB 15.5  HCT 46.6  PLT 172    Recent Labs Lab 03/31/16 1112  NA 139  K 4.3  CL 106  CO2 24  BUN 13  CREATININE 0.78  GLUCOSE 98  CALCIUM 9.0      Dg Chest 2 View 03/31/2016 FINDINGS: There is no appreciable edema or consolidation. Heart size and pulmonary vascularity are normal. No adenopathy. No pneumothorax. No bone lesions.  IMPRESSION: No edema or consolidation.  Derrick Coombe, MD 03/31/2016, 4:10 PM PGY-1 [place] Intern pager: [phone], text pages welcome   Upper Level Addendum:  I have seen and evaluated this patient along with Dr. Burr Turner and reviewed the above note, making necessary revisions in pink.  Derrick Crews, MD, MPH PGY-3,  [facility] 03/31/2016 5:38 PM

## 2016-03-31 NOTE — ED Provider Notes (Signed)
[facility] DEPT Provider Note   CSN: [id number] Arrival date & time: 03/31/16  1104     History   Chief Complaint Chief Complaint  Patient presents with  . Chest Pain    HPI Derrick Turner is a 46 y.o. male.  HPI   Pt who is an active smoker presents with central chest pain that has been intermittent x 2 days and constant since this morning at 7am.  Associated SOB, worse with exertion.   Right arm is tingling today.  Has had nausea and head "pressure" x 1 week.  Associated increased sweating this week.  Father had MI in 30s, maternal grandfather also had MI in 76s.  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    History reviewed. No pertinent past medical history.  Patient Active Problem List   Diagnosis Date Noted  . Chest pain 03/31/2016    Past Surgical History:  Procedure Laterality Date  . APPENDECTOMY    . CHOLECYSTECTOMY    . SHOULDER SURGERY         Home Medications    Prior to Admission medications   Medication Sig Start Date End Date Taking? Authorizing Provider  cetirizine (ZYRTEC) 10 MG tablet Take 10 mg by mouth daily.    Historical Provider, MD  ibuprofen (ADVIL,MOTRIN) 200 MG tablet Take 600 mg by mouth every 6 (six) hours as needed. For pain    Historical Provider, MD    Family History No family history on file.  Social History Social History  Substance Use Topics  . Smoking status: Current Every Day Smoker  . Smokeless tobacco: Never Used  . Alcohol use Yes     Allergies   Patient has no known allergies.   Review of Systems Review of Systems  All other systems reviewed and are negative.    Physical Exam Updated Vital Signs BP 130/95   Pulse 70   Temp 98.2 F (36.8 C) (Oral)   Resp 19   Ht 5\' 8"  (1.727 m)   Wt 106.6 kg   SpO2 97%   BMI 35.73 kg/m   Physical Exam  Constitutional: He appears well-developed and well-nourished. No distress.  HENT:  Head: Normocephalic and  atraumatic.  Neck: Neck supple.  Cardiovascular: Normal rate and regular rhythm.   Pulmonary/Chest: Effort normal and breath sounds normal. No respiratory distress. He has no wheezes. He has no rales.  Abdominal: Soft. He exhibits no distension and no mass. There is no tenderness. There is no rebound and no guarding.  Musculoskeletal: He exhibits no edema.  Neurological: He is alert. He exhibits normal muscle tone.  Skin: He is not diaphoretic.  Nursing note and vitals reviewed.    ED Treatments / Results  Labs (all labs ordered are listed, but only abnormal results are displayed) Labs Reviewed  HEPATIC FUNCTION PANEL - Abnormal; Notable for the following:       Result Value   Indirect Bilirubin 0.2 (*)    All other components within normal limits  BASIC METABOLIC PANEL  CBC  LIPASE, BLOOD  BRAIN NATRIURETIC PEPTIDE  I-STAT TROPOININ, ED    EKG  EKG Interpretation  Date/Time:  Thursday March 31 2016 11:08:26 EST Ventricular Rate:  87 PR Interval:  152 QRS Duration: 96 QT Interval:  344 QTC Calculation: 413 R Axis:   75 Text Interpretation:  Normal sinus rhythm Incomplete right bundle branch block Borderline ECG No STEMI Confirmed by TEGELER MD, CHRISTOPHER ([id number]) on  03/31/2016 2:43:26 PM       Radiology Dg Chest 2 View  Result Date: 03/31/2016 CLINICAL DATA:  Chest tightness and pressure EXAM: CHEST  2 VIEW COMPARISON:  None. FINDINGS: There is no appreciable edema or consolidation. Heart size and pulmonary vascularity are normal. No adenopathy. No pneumothorax. No bone lesions. IMPRESSION: No edema or consolidation. Electronically Signed   By: Lowella Grip III M.D.   On: 03/31/2016 11:33    Procedures Procedures (including critical care time)  Medications Ordered in ED Medications  nitroGLYCERIN (NITROSTAT) SL tablet 0.4 mg (0.4 mg Sublingual Given 03/31/16 1518)  ipratropium-albuterol (DUONEB) 0.5-2.5 (3) MG/3ML nebulizer solution 3 mL (3 mLs  Nebulization Given 03/31/16 1355)  aspirin chewable tablet 324 mg (324 mg Oral Given 03/31/16 1354)     Initial Impression / Assessment and Plan / ED Course  I have reviewed the triage vital signs and the nursing notes.  Pertinent labs & imaging results that were available during my care of the patient were reviewed by me and considered in my medical decision making (see chart for details).     Pt with hx smoking, family hx CAD, concerning story of central chest pressure with SOB, occasional lightheadedness, diaphoresis, symptoms are exertional.  Initial workup reassuring.  Heart score is 4.  Nebs given without improvement of pain, slight improvement in mild wheezing.  Admitted to [facility] for overnight observation and ACS r/o.    Final Clinical Impressions(s) / ED Diagnoses   Final diagnoses:  Chest pain, unspecified type    New Prescriptions New Prescriptions   No medications on file     Clayton Bibles, PA-C 03/31/16 [place], MD 04/01/16 2033

## 2016-03-31 NOTE — ED Triage Notes (Signed)
Patient complains of 2 days of central chest heaviness with no associated symptoms. States he had dizziness earlier in week with headache. Alert and oriented, NAD

## 2016-04-01 ENCOUNTER — Encounter (HOSPITAL_COMMUNITY): Payer: Self-pay | Admitting: Internal Medicine

## 2016-04-01 ENCOUNTER — Observation Stay (HOSPITAL_COMMUNITY): Payer: BLUE CROSS/BLUE SHIELD

## 2016-04-01 ENCOUNTER — Observation Stay (HOSPITAL_BASED_OUTPATIENT_CLINIC_OR_DEPARTMENT_OTHER): Payer: BLUE CROSS/BLUE SHIELD

## 2016-04-01 DIAGNOSIS — I249 Acute ischemic heart disease, unspecified: Secondary | ICD-10-CM

## 2016-04-01 DIAGNOSIS — Z720 Tobacco use: Secondary | ICD-10-CM | POA: Diagnosis not present

## 2016-04-01 DIAGNOSIS — R0602 Shortness of breath: Secondary | ICD-10-CM | POA: Diagnosis not present

## 2016-04-01 DIAGNOSIS — R7303 Prediabetes: Secondary | ICD-10-CM

## 2016-04-01 DIAGNOSIS — K219 Gastro-esophageal reflux disease without esophagitis: Secondary | ICD-10-CM

## 2016-04-01 DIAGNOSIS — R079 Chest pain, unspecified: Secondary | ICD-10-CM

## 2016-04-01 DIAGNOSIS — F17200 Nicotine dependence, unspecified, uncomplicated: Secondary | ICD-10-CM | POA: Diagnosis not present

## 2016-04-01 DIAGNOSIS — I208 Other forms of angina pectoris: Secondary | ICD-10-CM | POA: Diagnosis not present

## 2016-04-01 DIAGNOSIS — R0789 Other chest pain: Secondary | ICD-10-CM | POA: Diagnosis not present

## 2016-04-01 LAB — EXERCISE TOLERANCE TEST
CHL CUP MPHR: 173 {beats}/min
CHL RATE OF PERCEIVED EXERTION: 19
CSEPED: 11 min
CSEPHR: 88 %
Estimated workload: 13.7 METS
Exercise duration (sec): 46 s
Peak HR: 153 {beats}/min
Rest HR: 61 {beats}/min

## 2016-04-01 LAB — BASIC METABOLIC PANEL
ANION GAP: 5 (ref 5–15)
BUN: 11 mg/dL (ref 6–20)
CHLORIDE: 104 mmol/L (ref 101–111)
CO2: 29 mmol/L (ref 22–32)
Calcium: 8.4 mg/dL — ABNORMAL LOW (ref 8.9–10.3)
Creatinine, Ser: 0.76 mg/dL (ref 0.61–1.24)
GFR calc Af Amer: >60 mL/min (ref >60)
GFR calc non Af Amer: >60 mL/min (ref >60)
GLUCOSE: 103 mg/dL — AB (ref 65–99)
POTASSIUM: 4.4 mmol/L (ref 3.5–5.1)
Sodium: 138 mmol/L (ref 135–145)

## 2016-04-01 LAB — ECHOCARDIOGRAM COMPLETE
Height: 68 in
Weight: 3638.4 oz

## 2016-04-01 LAB — HIV ANTIBODY (ROUTINE TESTING W REFLEX): HIV Screen 4th Generation wRfx: NONREACTIVE

## 2016-04-01 LAB — LIPID PANEL
CHOLESTEROL: 147 mg/dL (ref 0–200)
HDL: 30 mg/dL — AB (ref >40)
LDL CALC: 105 mg/dL — AB (ref 0–99)
TRIGLYCERIDES: 61 mg/dL (ref <150)
Total CHOL/HDL Ratio: 4.9 RATIO
VLDL: 12 mg/dL (ref 0–40)

## 2016-04-01 LAB — CBC
HEMATOCRIT: 43.5 % (ref 39.0–52.0)
HEMOGLOBIN: 14.8 g/dL (ref 13.0–17.0)
MCH: 29.4 pg (ref 26.0–34.0)
MCHC: 34 g/dL (ref 30.0–36.0)
MCV: 86.3 fL (ref 78.0–100.0)
Platelets: 141 10*3/uL — ABNORMAL LOW (ref 150–400)
RBC: 5.04 MIL/uL (ref 4.22–5.81)
RDW: 13.5 % (ref 11.5–15.5)
WBC: 6.9 10*3/uL (ref 4.0–10.5)

## 2016-04-01 LAB — TROPONIN I: Troponin I: <0.03 ng/mL (ref <0.03)

## 2016-04-01 LAB — HEMOGLOBIN A1C
HEMOGLOBIN A1C: 5.7 % — AB (ref 4.8–5.6)
Mean Plasma Glucose: 117

## 2016-04-01 MED ORDER — PANTOPRAZOLE SODIUM 40 MG PO TBEC: 40.0000 mg | DELAYED_RELEASE_TABLET | Freq: Every day | ORAL | 0 refills | Status: DC

## 2016-04-01 MED ORDER — SUCRALFATE 1 G PO TABS
1.0000 g | ORAL_TABLET | Freq: Three times a day (TID) | ORAL | Status: DC
  Administered 2016-04-01: 1 g via ORAL
  Filled 2016-04-01: qty 1

## 2016-04-01 MED ORDER — METOPROLOL TARTRATE 12.5 MG HALF TABLET
12.5000 mg | ORAL_TABLET | Freq: Two times a day (BID) | ORAL | Status: DC
  Administered 2016-04-01: 12.5 mg via ORAL
  Filled 2016-04-01: qty 1

## 2016-04-01 MED ORDER — SUCRALFATE 1 G PO TABS: 1.0000 g | ORAL_TABLET | Freq: Three times a day (TID) | ORAL | 0 refills | Status: DC

## 2016-04-01 MED ORDER — NITROGLYCERIN 0.4 MG SL SUBL: 0.4000 mg | SUBLINGUAL_TABLET | SUBLINGUAL | 12 refills | Status: DC | PRN

## 2016-04-01 MED ORDER — METOPROLOL TARTRATE 25 MG PO TABS
12.5000 mg | ORAL_TABLET | Freq: Two times a day (BID) | ORAL | 0 refills | Status: DC
Start: 2016-04-01 — End: 2016-07-15

## 2016-04-01 MED ORDER — ENOXAPARIN SODIUM 60 MG/0.6ML ~~LOC~~ SOLN
50.0000 mg | SUBCUTANEOUS | Status: DC
  Filled 2016-04-01: qty 0.6

## 2016-04-01 MED ORDER — ASPIRIN 81 MG PO TBEC: 81.0000 mg | DELAYED_RELEASE_TABLET | Freq: Every day | ORAL | 0 refills | Status: DC

## 2016-04-01 NOTE — Progress Notes (Signed)
Cath on 2/28 @ 8:30am with Dr. Burt Knack. Please arrive at 6:30am at [facility] for admission.

## 2016-04-01 NOTE — Progress Notes (Signed)
  Family Medicine Teaching Service Daily Progress Note Intern Pager: [phone]  Patient name: Derrick Turner Medical record number: [id number] Date of birth: 1970/06/16 Age: 46 y.o. Gender: male  Primary Care Provider: Deloria Lair, MD Consultants: none Code Status: full  Pt Overview and Major Events to Date:  2/22- admitted to [facility] for ACS r/o  Assessment and Plan: Derrick Turner is a 46 y.o. male presenting with chest pressure. PMH is significant for bilateral shoulder surgeries, seasonal allergies, tobacco use  Chest Pain, new- improved: ACS r/o negative so far, concern this may be GERD related - monitor on telemetry - troponin negative x 3  - repeat EKG with no ST changes - Echo pending - tylenol PRN pain - vitals per unit - Risk stratify: Lipid panel pending - continue PPI, added carafate TID w/ meals  Tobacco Abuse, stable - 30 pack/year smoking history. - cessation counseling - nicotine patch PRN  Seasonal Allergies- chronic - add back home zyrtec as needed  FEN/GI: Saline lock IV, cardiac diet Prophylaxis: Lovenox  Disposition: pending ACS r/o work up, possibly home today  Subjective:  Derrick Turner is doing well, he had one more episode of substernal chest pain in the early morning hours. Felt similar to prior episodes. No SOB.   Objective: Temp:  [97.5 F (36.4 C)-98.2 F (36.8 C)] 97.8 F (36.6 C) (02/23 0450) Pulse Rate:  [67-86] 82 (02/22 2025) Resp:  [11-25] 12 (02/23 0450) BP: (111-131)/(76-95) 111/81 (02/23 0450) SpO2:  [92 %-100 %] 98 % (02/23 0450) Weight:  [227 lb 6.4 oz (103.1 kg)-235 lb (106.6 kg)] 227 lb 6.4 oz (103.1 kg) (02/23 0450) Physical Exam: General: middle aged man laying in bed in NAD Cardiovascular: RRR no MRG Respiratory: clear to auscultation bilaterally, no increased work of breathing Abdomen: soft, non-tender, non-distended, +BS Extremities: no edema or cyanosis  Laboratory:  Recent Labs Lab 03/31/16 1112  04/01/16 0428  WBC 9.3 6.9  HGB 15.5 14.8  HCT 46.6 43.5  PLT 172 141*    Recent Labs Lab 03/31/16 1112 03/31/16 1417 04/01/16 0428  NA 139  --  138  K 4.3  --  4.4  CL 106  --  104  CO2 24  --  29  BUN 13  --  11  CREATININE 0.78  --  0.76  CALCIUM 9.0  --  8.4*  PROT  --  6.8  --   BILITOT  --  0.4  --   ALKPHOS  --  72  --   ALT  --  21  --   AST  --  21  --   GLUCOSE 98  --  103*   TSH- 1.333 A1C 5.7  Imaging/Diagnostic Tests: Dg Chest 2 View  Result Date: 03/31/2016 CLINICAL DATA:  Chest tightness and pressure EXAM: CHEST  2 VIEW COMPARISON:  None. FINDINGS: There is no appreciable edema or consolidation. Heart size and pulmonary vascularity are normal. No adenopathy. No pneumothorax. No bone lesions. IMPRESSION: No edema or consolidation. Electronically Signed   By: Lowella Grip III M.D.   On: 03/31/2016 11:33     Steve Rattler, DO 04/01/2016, 7:18 AM PGY-1, [place] Intern pager: [phone], text pages welcome

## 2016-04-01 NOTE — Progress Notes (Signed)
  Echocardiogram 2D Echocardiogram has been performed.  Jennette Dubin 04/01/2016, 9:21 AM

## 2016-04-01 NOTE — Consult Note (Signed)
Cardiology Consult    Patient ID: COYT ARNN MRN: [id number], DOB/AGE: 48-Jul-1972   Admit date: 03/31/2016 Date of Consult: 04/01/2016  Primary Physician: Deloria Lair, MD Primary Cardiologist: None Requesting Provider: Dr. Brita Romp  Reason for Consult: Chest pain  Patient Profile    46 y/o man with history of numerous chronic musculoskeletal injuries and daily current tobacco use who presented to the ED for 3 days of intermittent chest pain.  Past Medical History   Past Medical History:  Diagnosis Date  . History of kidney stones     Past Surgical History:  Procedure Laterality Date  . APPENDECTOMY    . CHOLECYSTECTOMY    . SHOULDER SURGERY    . testectomy     Allergies  No Known Allergies  History of Present Illness    Derrick Turner was feeling in usual health until one week ago when he has noticed frequent and worsening headaches then 3 days ago started having intermittent chest pain with dyspnea. This pain was intermittent but became worst when feeling stressed out from work or when climbing stairs. His pain radiated to the right arm He does not seem doctors very regularly and has no known heart problems. He has never had similar pain in the past. He does have a chronic daily cough but walks about a mile with his dog daily without dyspnea. His father and grandfather had heart attacks in their 15s.  After arrival to the ED CXR, ECG, and troponins were negative. He received nebulizer treatment with improvement in wheezing but no change in chest pain or dyspnea. He has continued to feel mild chest pain varying anywhere 3-7/10 in severity overnight but without much shortness of breath. Overnight telemetry has shown mild bradycardia with an isolated missed beat every few minutes.  Inpatient Medications    . aspirin  324 mg Oral NOW   Or  . aspirin  300 mg Rectal NOW  . aspirin EC  81 mg Oral Daily  . enoxaparin (LOVENOX) injection  50 mg Subcutaneous Q24H  .  pantoprazole  40 mg Oral Daily  . sodium chloride flush  3 mL Intravenous Q12H  . sucralfate  1 g Oral TID WC & HS     Outpatient Medications    Prior to Admission medications   Medication Sig Start Date End Date Taking? Authorizing Provider  cetirizine (ZYRTEC) 10 MG tablet Take 10 mg by mouth daily.   Yes Historical Provider, MD  gabapentin (NEURONTIN) 300 MG capsule Take 300 mg by mouth 2 (two) times daily. 03/11/16  Yes Historical Provider, MD  guaiFENesin (MUCINEX) 600 MG 12 hr tablet Take 600 mg by mouth 2 (two) times daily as needed for cough or to loosen phlegm.   Yes Historical Provider, MD  methocarbamol (ROBAXIN) 750 MG tablet Take 750 mg by mouth daily as needed for spasms. 02/09/16  Yes Historical Provider, MD  traMADol (ULTRAM) 50 MG tablet Take 50 mg by mouth every 12 (twelve) hours as needed for moderate pain.  02/09/16  Yes Historical Provider, MD  traZODone (DESYREL) 50 MG tablet Take 100 mg by mouth at bedtime. 2 TABS 11/09/15  Yes Historical Provider, MD  meloxicam (MOBIC) 7.5 MG tablet Take 7.5 mg by mouth daily. 12/08/15   Historical Provider, MD     Family History     Family History  Problem Relation Age of Onset  . Heart attack Father   . Heart attack Maternal Grandmother   . Heart attack Maternal Grandfather  Social History    Social History   Social History  . Marital status: Single    Spouse name: N/A  . Number of children: N/A  . Years of education: N/A   Occupational History  . Not on file.   Social History Main Topics  . Smoking status: Current Every Day Smoker    Packs/day: 1.00    Years: 30.00    Types: Cigarettes  . Smokeless tobacco: Never Used  . Alcohol use Yes  . Drug use: No  . Sexual activity: Not on file   Other Topics Concern  . Not on file   Social History Narrative  . No narrative on file     Review of Systems    General:  No chills, fever, night sweats or weight changes.  Cardiovascular:  Chest  pain Dermatological: No rash, lesions/masses Respiratory: Cough Urologic: No hematuria, dysuria Abdominal:   No nausea, vomiting, diarrhea, bright red blood per rectum, melena, or hematemesis Neurologic:  Headaches All other systems reviewed and are otherwise negative except as noted above.  Physical Exam    Blood pressure 111/81, pulse 82, temperature 97.8 F (36.6 C), temperature source Oral, resp. rate 12, height 5\' 8"  (1.727 m), weight 227 lb 6.4 oz (103.1 kg), SpO2 98 %.  General: Pleasant, NAD Psych: Normal affect. Neuro: Alert and oriented X 3. Moves all extremities spontaneously. HEENT: Normal  Neck: Supple without bruits or JVD. Lungs:  Bilateral end inspiratory wheezes, normal WOB Heart: RRR no s3, s4, or murmurs. Abdomen: Soft, non-tender, non-distended Extremities: No edema. DP/Radials 2+ and equal bilaterally.  Labs    Recent Labs  03/31/16 1730 03/31/16 2227 04/01/16 0428  TROPONINI <0.03 <0.03 <0.03   Lab Results  Component Value Date   WBC 6.9 04/01/2016   HGB 14.8 04/01/2016   HCT 43.5 04/01/2016   MCV 86.3 04/01/2016   PLT 141 (L) 04/01/2016     Recent Labs Lab 03/31/16 1417 04/01/16 0428  NA  --  138  K  --  4.4  CL  --  104  CO2  --  29  BUN  --  11  CREATININE  --  0.76  CALCIUM  --  8.4*  PROT 6.8  --   BILITOT 0.4  --   ALKPHOS 72  --   ALT 21  --   AST 21  --   GLUCOSE  --  103*   Lab Results  Component Value Date   CHOL 147 04/01/2016   HDL 30 (L) 04/01/2016   LDLCALC 105 (H) 04/01/2016   TRIG 61 04/01/2016    Radiology Studies    Dg Chest 2 View  Result Date: 03/31/2016 CLINICAL DATA:  Chest tightness and pressure EXAM: CHEST  2 VIEW COMPARISON:  None. FINDINGS: There is no appreciable edema or consolidation. Heart size and pulmonary vascularity are normal. No adenopathy. No pneumothorax. No bone lesions. IMPRESSION: No edema or consolidation. Electronically Signed   By: Lowella Grip III M.D.   On: 03/31/2016 11:33     ECG & Cardiac Imaging    ECG 2/23 0448: Incomplete RBBB with v1 T wave inversions consistent with repolarization change, no prior baseline for comparison  Assessment & Plan   Chest pain: Pain is somewhat atypical although worsening with exertion and not reproducible on examination. Acute ischemia ruled out with troponin, ECG normal. Does have strong family history of MIs in 1s. He reports a high stress level corresponding with his symptoms. There is a conduction defect/partial  RBBB with some dropped ventricular beats so TTE may be interesting. -Agree with 2D echo today  -If echo is normal exercise stress test would be useful  -Risk factor modification: dyslipidemia and daily smoking   Derrick Salina, MD PGY-II Internal Medicine Resident Pager# [phone] 04/01/2016, 8:11 AM   I have seen and examined the patient along with Derrick Salina, MD.  I have reviewed the chart, notes and new data.  I agree with his note.  Key new complaints: no symptoms at rest Key examination changes: normal CV exam Key new findings / data: normal echo  PLAN: Treadmill ECG stress test today. If abnormal, will need non-urgent coronary angiography. If normal, can DC home. Recommend smoking cessation. Weight loss and exercise should suffice to improve his lipid profile if CAD is not identified. Start statin if CAD is detected.  Derrick Klein, MD, [place] [phone] 04/01/2016, 10:18 AM

## 2016-04-01 NOTE — Progress Notes (Signed)
Chest discomfort identical to his clinical complaint was reproduced during treadmill exercise and resolved with a few minutes of rest. Despite no ECG changes, I think there is substantial likelihood of CAD.  He describes stable angina and catheterization is not urgent. Schedule is very full today.  Will schedule cath for early next week. This procedure has been fully reviewed with the patient and written informed consent has been obtained.  Send home with ASA 81 mg daily, SL NTG ( I instructed him in it's proper use), low dose metoprolol 12.5 mg BID (he has mild bradycardia at rest), smoking cessation instructions.   He should promptly seek medical care/return to ED for chest discomfort 20 min not relieved by NTG. However, he seems to describe stable angina and has a low risk for acute coronary events in the immediate future.  Sanda Klein, MD, [facility] [phone] office [phone] pager

## 2016-04-01 NOTE — Discharge Instructions (Signed)
Cath on 2/28 @ 8:30am with Dr. Burt Knack. Please arrive at 6:30am at [facility] for admission. Nothing to eat after midnight prior to cath.   Chest Wall Pain Introduction Chest wall pain is pain in or around the bones and muscles of your chest. Sometimes, an injury causes this pain. Sometimes, the cause may not be known. This pain may take several weeks or longer to get better. Follow these instructions at home: Pay attention to any changes in your symptoms. Take these actions to help with your pain:  Rest as told by your doctor.  Avoid activities that cause pain. Try not to use your chest, belly (abdominal), or side muscles to lift heavy things.  If directed, apply ice to the painful area:  Put ice in a plastic bag.  Place a towel between your skin and the bag.  Leave the ice on for 20 minutes, 2-3 times per day.  Take over-the-counter and prescription medicines only as told by your doctor.  Do not use tobacco products, including cigarettes, chewing tobacco, and e-cigarettes. If you need help quitting, ask your doctor.  Keep all follow-up visits as told by your doctor. This is important. Contact a doctor if:  You have a fever.  Your chest pain gets worse.  You have new symptoms. Get help right away if:  You feel sick to your stomach (nauseous) or you throw up (vomit).  You feel sweaty or light-headed.  You have a cough with phlegm (sputum) or you cough up blood.  You are short of breath. This information is not intended to replace advice given to you by your health care provider. Make sure you discuss any questions you have with your health care provider. Document Released: 07/13/2007 Document Revised: 07/02/2015 Document Reviewed: 04/21/2014  2017 Elsevier

## 2016-04-01 NOTE — Progress Notes (Signed)
Pt HR has been in the 50s throughout the night. Has dropped into the low 40s but not sustained. RN checks on Pt, easily awoken and denies any symptoms. Will continue to monitor.

## 2016-04-01 NOTE — Discharge Summary (Signed)
Derrick Turner Discharge Summary  Patient name: Derrick Turner Medical record number: [id number] Date of birth: [birth year] Age: 46 y.o. Gender: male Date of Admission: 03/31/2016  Date of Discharge: 04/03/16  Admitting Physician: Lind Covert, MD  Primary Care Provider: Deloria Lair, MD Consultants: cardiology  Indication for Hospitalization: chest pain  Discharge Diagnoses/Problem List:  Chest pain Tobacco abuse Pre-diabetes  Disposition: home  Discharge Condition: stable  Discharge Exam: see progress note from day of discharge  Brief Turner Course:   Derrick Turner is a 46 year old male with PMH of bilateral shoulder surgeries, seasonal allergies, tobacco use who presented to [facility] ED with chest pain. He was admitted to [facility] for ACS rule out. Troponin was trended and remained negative. EKG on admission was negative for ST changes. Repeat EKG was unchanged. Risk stratification labs were obtained with normal TSH, slightly elevated A1C, and mildly abnormal lipid panel.  Patient had another episode of chest pain during admission and was given oxycodone IR 5 mg with relief of pain. He was started on a PPI and carafate due to concern for contributing GERD to presentation. Cardiology saw the patient and recommended inpatient stress test and an outpatient cath to be done. Aspirin was started. Patient's stress test reproduced his chest pain without radiation and without EKG changes. Cath was scheduled for 2/26 in the morning. Smoking cessation was encouraged. He was discharged home on 2/23 in stable condition with close follow up.  Issues for Follow Up:  1. Cath on 2/26, follow up with cardiology as scheduled 2. ASCVD risk 7%, recommend considering addition of a moderate intensity statin. Cardiology felt this could be improved with lifestyle changes alone. 3. A1C slightly elevated at 5.7, encourage lifestyle modifications 4. Continue to  encourage smoking cessation  Significant Procedures: none  Significant Labs and Imaging:   Recent Labs Lab 03/31/16 1112 04/01/16 0428  WBC 9.3 6.9  HGB 15.5 14.8  HCT 46.6 43.5  PLT 172 141*    Recent Labs Lab 03/31/16 1112 03/31/16 1417 03/31/16 1730 04/01/16 0428  NA 139  --   --  138  K 4.3  --   --  4.4  CL 106  --   --  104  CO2 24  --   --  29  GLUCOSE 98  --   --  103*  BUN 13  --   --  11  CREATININE 0.78  --   --  0.76  CALCIUM 9.0  --   --  8.4*  MG  --   --  1.8  --   ALKPHOS  --  72  --   --   AST  --  21  --   --   ALT  --  21  --   --   ALBUMIN  --  4.1  --   --    Troponin <0.03 x 3  A1C 5.7 TSH 1.333 Lipids Total cholesterol 147 HDL 30 LDL 105 Triglycerides 61  Study Conclusions  - Left ventricle: The cavity size was moderately dilated. Systolic   function was normal. The estimated ejection fraction was in the   range of 55% to 60%. Wall motion was normal; there were no   regional wall motion abnormalities. Left ventricular diastolic   function parameters were normal. - Aortic valve: Trileaflet; normal thickness, mildly calcified   leaflets. - Mitral valve: There was trivial regurgitation. - Pulmonary arteries: Systolic pressure could not be accurately  estimated.  Stress test  ECG Baseline ECG exhibits normal sinus rhythm..    Stress Findings The patient exercised following the Bruce protocol.  Patient had 5/10 chest pressure started @10 :30 minutes of exercise portion that reduced to 3/10 after 10 minutes of rest. No radiation. The patient experienced non-limiting angina during the stress test.   Test was stopped per protocol.   Blood pressure and heart rate demonstrated a normal response to exercise. Blood pressure demonstrated a normal response to exercise. Overall, the patient's exercise capacity was normal.   Recovery time: 8 minutes. The patient's response to exercise was adequate for diagnosis.    Response to Stress There  was no ST segment deviation noted during stress.  No T wave inversion was noted during stress. Arrhythmias during stress: rare PACs.  Arrhythmias during recovery: rare rare, PACs.  Arrhythmias were not significant.  ECG was interpretable and conclusive.        Results/Tests Pending at Time of Discharge: none  Discharge Medications:  Allergies as of 04/01/2016   No Known Allergies     Medication List    TAKE these medications   aspirin 81 MG EC tablet Take 1 tablet (81 mg total) by mouth daily.   cetirizine 10 MG tablet Commonly known as:  ZYRTEC Take 10 mg by mouth daily.   gabapentin 300 MG capsule Commonly known as:  NEURONTIN Take 300 mg by mouth 2 (two) times daily.   guaiFENesin 600 MG 12 hr tablet Commonly known as:  MUCINEX Take 600 mg by mouth 2 (two) times daily as needed for cough or to loosen phlegm.   meloxicam 7.5 MG tablet Commonly known as:  MOBIC Take 7.5 mg by mouth daily.   methocarbamol 750 MG tablet Commonly known as:  ROBAXIN Take 750 mg by mouth daily as needed for spasms.   metoprolol tartrate 25 MG tablet Commonly known as:  LOPRESSOR Take 0.5 tablets (12.5 mg total) by mouth 2 (two) times daily.   nitroGLYCERIN 0.4 MG SL tablet Commonly known as:  NITROSTAT Place 1 tablet (0.4 mg total) under the tongue every 5 (five) minutes x 3 doses as needed for chest pain.   pantoprazole 40 MG tablet Commonly known as:  PROTONIX Take 1 tablet (40 mg total) by mouth daily.   sucralfate 1 g tablet Commonly known as:  CARAFATE Take 1 tablet (1 g total) by mouth 4 (four) times daily -  with meals and at bedtime.   traMADol 50 MG tablet Commonly known as:  ULTRAM Take 50 mg by mouth every 12 (twelve) hours as needed for moderate pain.   traZODone 50 MG tablet Commonly known as:  DESYREL Take 100 mg by mouth at bedtime. 2 TABS       Discharge Instructions: Please refer to Patient Instructions section of EMR for full details.  Patient was  counseled important signs and symptoms that should prompt return to medical care, changes in medications, dietary instructions, activity restrictions, and follow up appointments.   Follow-Up Appointments: Follow-up Information    TAPPER,DAVID B, MD. Schedule an appointment as soon as possible for a visit.   Specialty:  Family Medicine Contact information: [place] [address], DO 04/03/2016, 10:55 AM PGY-1, [place]

## 2016-04-05 NOTE — Research (Signed)
Called patient about possible participation in the [place]. I reviewed the study with patient. Patient stated he was interested and would like me to e-mail him a Consent form for him to review. ICF emailed. Questions encouraged and answered. Patient will e-mail questions or speak with Research tomorrow. I thanked patient for  Allowing me to review this over the phone.

## 2016-04-06 ENCOUNTER — Encounter (HOSPITAL_COMMUNITY): Payer: Self-pay | Admitting: Cardiovascular Disease

## 2016-04-06 ENCOUNTER — Encounter (HOSPITAL_COMMUNITY)
Admission: RE | Disposition: A | Discharge status: Home / Self Care | Payer: Self-pay | Source: Ambulatory Visit | Attending: Cardiovascular Disease

## 2016-04-06 ENCOUNTER — Ambulatory Visit (HOSPITAL_COMMUNITY)
Admission: RE | Admit: 2016-04-06 | Discharge: 2016-04-06 | Disposition: A | Discharge status: Home / Self Care | Payer: BLUE CROSS/BLUE SHIELD | Source: Ambulatory Visit | Attending: Cardiovascular Disease | Admitting: Cardiovascular Disease

## 2016-04-06 DIAGNOSIS — F17210 Nicotine dependence, cigarettes, uncomplicated: Secondary | ICD-10-CM | POA: Insufficient documentation

## 2016-04-06 DIAGNOSIS — R079 Chest pain, unspecified: Secondary | ICD-10-CM | POA: Diagnosis present

## 2016-04-06 DIAGNOSIS — I2510 Atherosclerotic heart disease of native coronary artery without angina pectoris: Secondary | ICD-10-CM | POA: Diagnosis not present

## 2016-04-06 DIAGNOSIS — R0789 Other chest pain: Secondary | ICD-10-CM | POA: Insufficient documentation

## 2016-04-06 HISTORY — PX: LEFT HEART CATH AND CORONARY ANGIOGRAPHY: CATH118249

## 2016-04-06 LAB — PROTIME-INR
INR: 1.03
PROTHROMBIN TIME: 13.6 s (ref 11.4–15.2)

## 2016-04-06 SURGERY — LEFT HEART CATH AND CORONARY ANGIOGRAPHY
Anesthesia: LOCAL

## 2016-04-06 MED ORDER — ACETAMINOPHEN 325 MG PO TABS: 650.0000 mg | ORAL_TABLET | ORAL | Status: DC | PRN

## 2016-04-06 MED ORDER — LIDOCAINE HCL (PF) 1 % IJ SOLN
INTRAMUSCULAR | Status: AC
  Filled 2016-04-06: qty 30

## 2016-04-06 MED ORDER — MIDAZOLAM HCL 2 MG/2ML IJ SOLN
INTRAMUSCULAR | Status: DC | PRN
  Administered 2016-04-06: 2 mg via INTRAVENOUS

## 2016-04-06 MED ORDER — SODIUM CHLORIDE 0.9% FLUSH: 3.0000 mL | Freq: Two times a day (BID) | INTRAVENOUS | Status: DC

## 2016-04-06 MED ORDER — SODIUM CHLORIDE 0.9 % WEIGHT BASED INFUSION: 1.0000 mL/kg/h | INTRAVENOUS | Status: DC

## 2016-04-06 MED ORDER — SODIUM CHLORIDE 0.9% FLUSH: 3.0000 mL | INTRAVENOUS | Status: DC | PRN

## 2016-04-06 MED ORDER — IOPAMIDOL (ISOVUE-370) INJECTION 76%
INTRAVENOUS | Status: AC
  Filled 2016-04-06: qty 100

## 2016-04-06 MED ORDER — DIAZEPAM 5 MG PO TABS
5.0000 mg | ORAL_TABLET | Freq: Once | ORAL | Status: AC
  Administered 2016-04-06: 5 mg via ORAL

## 2016-04-06 MED ORDER — HEPARIN SODIUM (PORCINE) 1000 UNIT/ML IJ SOLN
INTRAMUSCULAR | Status: DC | PRN
  Administered 2016-04-06: 5000 [IU] via INTRAVENOUS

## 2016-04-06 MED ORDER — NITROGLYCERIN 0.4 MG SL SUBL
0.4000 mg | SUBLINGUAL_TABLET | SUBLINGUAL | Status: DC | PRN
  Administered 2016-04-06: 0.4 mg via SUBLINGUAL

## 2016-04-06 MED ORDER — SODIUM CHLORIDE 0.9 % IV SOLN: 250.0000 mL | INTRAVENOUS | Status: DC | PRN

## 2016-04-06 MED ORDER — FENTANYL CITRATE (PF) 100 MCG/2ML IJ SOLN
INTRAMUSCULAR | Status: AC
  Filled 2016-04-06: qty 2

## 2016-04-06 MED ORDER — HEPARIN (PORCINE) IN NACL 2-0.9 UNIT/ML-% IJ SOLN
INTRAMUSCULAR | Status: AC
  Filled 2016-04-06: qty 1000

## 2016-04-06 MED ORDER — HEPARIN SODIUM (PORCINE) 1000 UNIT/ML IJ SOLN
INTRAMUSCULAR | Status: AC
  Filled 2016-04-06: qty 1

## 2016-04-06 MED ORDER — HEPARIN (PORCINE) IN NACL 2-0.9 UNIT/ML-% IJ SOLN
INTRAMUSCULAR | Status: DC | PRN
  Administered 2016-04-06: 1000 mL

## 2016-04-06 MED ORDER — NITROGLYCERIN 0.4 MG SL SUBL
SUBLINGUAL_TABLET | SUBLINGUAL | Status: AC
  Filled 2016-04-06: qty 1

## 2016-04-06 MED ORDER — SODIUM CHLORIDE 0.9 % WEIGHT BASED INFUSION
3.0000 mL/kg/h | INTRAVENOUS | Status: AC
  Administered 2016-04-06: 3 mL/kg/h via INTRAVENOUS

## 2016-04-06 MED ORDER — VERAPAMIL HCL 2.5 MG/ML IV SOLN
INTRAVENOUS | Status: AC
  Filled 2016-04-06: qty 2

## 2016-04-06 MED ORDER — ASPIRIN 81 MG PO CHEW: 81.0000 mg | CHEWABLE_TABLET | ORAL | Status: DC

## 2016-04-06 MED ORDER — LIDOCAINE HCL (PF) 1 % IJ SOLN
INTRAMUSCULAR | Status: DC | PRN
  Administered 2016-04-06: 2 mL

## 2016-04-06 MED ORDER — IOPAMIDOL (ISOVUE-370) INJECTION 76%
INTRAVENOUS | Status: DC | PRN
Start: 2016-04-06 — End: 2016-04-06
  Administered 2016-04-06: 50 mL via INTRA_ARTERIAL

## 2016-04-06 MED ORDER — DIAZEPAM 5 MG PO TABS
ORAL_TABLET | ORAL | Status: AC
  Administered 2016-04-06: 5 mg via ORAL
  Filled 2016-04-06: qty 1

## 2016-04-06 MED ORDER — VERAPAMIL HCL 2.5 MG/ML IV SOLN
INTRAVENOUS | Status: DC | PRN
  Administered 2016-04-06: 10 mL via INTRA_ARTERIAL

## 2016-04-06 MED ORDER — FENTANYL CITRATE (PF) 100 MCG/2ML IJ SOLN
INTRAMUSCULAR | Status: DC | PRN
  Administered 2016-04-06: 25 ug via INTRAVENOUS

## 2016-04-06 MED ORDER — MIDAZOLAM HCL 2 MG/2ML IJ SOLN
INTRAMUSCULAR | Status: AC
  Filled 2016-04-06: qty 2

## 2016-04-06 MED ORDER — ONDANSETRON HCL 4 MG/2ML IJ SOLN: 4.0000 mg | Freq: Four times a day (QID) | INTRAMUSCULAR | Status: DC | PRN

## 2016-04-06 SURGICAL SUPPLY — 10 items
CATH 5FR JL3.5 JR4 ANG PIG MP (CATHETERS) ×1 IMPLANT
DEVICE RAD COMP TR BAND LRG (VASCULAR PRODUCTS) ×1 IMPLANT
GLIDESHEATH SLEND SS 6F .021 (SHEATH) ×1 IMPLANT
GUIDEWIRE INQWIRE 1.5J.035X260 (WIRE) IMPLANT
INQWIRE 1.5J .035X260CM (WIRE) ×2
KIT HEART LEFT (KITS) ×2 IMPLANT
PACK CARDIAC CATHETERIZATION (CUSTOM PROCEDURE TRAY) ×2 IMPLANT
SYR MEDRAD MARK V 150ML (SYRINGE) ×2 IMPLANT
TRANSDUCER W/STOPCOCK (MISCELLANEOUS) ×2 IMPLANT
TUBING CIL FLEX 10 FLL-RA (TUBING) ×2 IMPLANT

## 2016-04-06 NOTE — Interval H&P Note (Signed)
Cath Lab Visit (complete for each Cath Lab visit)  Clinical Evaluation Leading to the Procedure:   ACS: No.  Non-ACS:    Anginal Classification: CCS III  Anti-ischemic medical therapy: Minimal Therapy (1 class of medications)  Non-Invasive Test Results: Low-risk stress test findings: cardiac mortality <1%/year  Prior CABG: No previous CABG      History and Physical Interval Note:  04/06/2016 8:46 AM  Derrick Turner  has presented today for surgery, with the diagnosis of chest pain  The various methods of treatment have been discussed with the patient and family. After consideration of risks, benefits and other options for treatment, the patient has consented to  Procedure(s): Left Heart Cath and Coronary Angiography (N/A) as a surgical intervention .  The patient's history has been reviewed, patient examined, no change in status, stable for surgery.  I have reviewed the patient's chart and labs.  Questions were answered to the patient's satisfaction.     Sherren Mocha

## 2016-04-06 NOTE — Progress Notes (Signed)
Pt c/o chest pain of 3/10. Oxygen started at 2 L [place] NTG given per protocol

## 2016-04-06 NOTE — Progress Notes (Signed)
Dr Burt Knack assessed pt who's CP is now  a 2.  No further NTG needed at this time. Valium ordered and given

## 2016-04-06 NOTE — Discharge Instructions (Signed)
Radial Site Care °Refer to this sheet in the next few weeks. These instructions provide you with information about caring for yourself after your procedure. Your health care provider may also give you more specific instructions. Your treatment has been planned according to current medical practices, but problems sometimes occur. Call your health care provider if you have any problems or questions after your procedure. °What can I expect after the procedure? °After your procedure, it is typical to have the following: °· Bruising at the radial site that usually fades within 1-2 weeks. °· Blood collecting in the tissue (hematoma) that may be painful to the touch. It should usually decrease in size and tenderness within 1-2 weeks. °Follow these instructions at home: °· Take medicines only as directed by your health care provider. °· You may shower 24-48 hours after the procedure or as directed by your health care provider. Remove the bandage (dressing) and gently wash the site with plain soap and water. Pat the area dry with a clean towel. Do not rub the site, because this may cause bleeding. °· Do not take baths, swim, or use a hot tub until your health care provider approves. °· Check your insertion site every day for redness, swelling, or drainage. °· Do not apply powder or lotion to the site. °· Do not flex or bend the affected arm for 24 hours or as directed by your health care provider. °· Do not push or pull heavy objects with the affected arm for 24 hours or as directed by your health care provider. °· Do not lift over 10 lb (4.5 kg) for 5 days after your procedure or as directed by your health care provider. °· Ask your health care provider when it is okay to: °¨ Return to work or school. °¨ Resume usual physical activities or sports. °¨ Resume sexual activity. °· Do not drive home if you are discharged the same day as the procedure. Have someone else drive you. °· You may drive 24 hours after the procedure  unless otherwise instructed by your health care provider. °· Do not operate machinery or power tools for 24 hours after the procedure. °· If your procedure was done as an outpatient procedure, which means that you went home the same day as your procedure, a responsible adult should be with you for the first 24 hours after you arrive home. °· Keep all follow-up visits as directed by your health care provider. This is important. °Contact a health care provider if: °· You have a fever. °· You have chills. °· You have increased bleeding from the radial site. Hold pressure on the site. °Get help right away if: °· You have unusual pain at the radial site. °· You have redness, warmth, or swelling at the radial site. °· You have drainage (other than a small amount of blood on the dressing) from the radial site. °· The radial site is bleeding, and the bleeding does not stop after 30 minutes of holding steady pressure on the site. °· Your arm or hand becomes pale, cool, tingly, or numb. °This information is not intended to replace advice given to you by your health care provider. Make sure you discuss any questions you have with your health care provider. °Document Released: 02/26/2010 Document Revised: 07/02/2015 Document Reviewed: 08/12/2013 °Elsevier Interactive Patient Education © 2017 [employer] Inc[employer] ° °

## 2016-04-06 NOTE — Progress Notes (Signed)
Thanks, [name]. I probably over-reacted, story sounded so good. Derrick Turner

## 2016-04-06 NOTE — H&P (View-Only) (Signed)
Chest discomfort identical to his clinical complaint was reproduced during treadmill exercise and resolved with a few minutes of rest. Despite no ECG changes, I think there is substantial likelihood of CAD.  He describes stable angina and catheterization is not urgent. Schedule is very full today.  Will schedule cath for early next week. This procedure has been fully reviewed with the patient and written informed consent has been obtained.  Send home with ASA 81 mg daily, SL NTG ( I instructed him in it's proper use), low dose metoprolol 12.5 mg BID (he has mild bradycardia at rest), smoking cessation instructions.   He should promptly seek medical care/return to ED for chest discomfort 20 min not relieved by NTG. However, he seems to describe stable angina and has a low risk for acute coronary events in the immediate future.  Sanda Klein, MD, [facility] [phone] office [phone] pager

## 2016-05-03 ENCOUNTER — Encounter: Payer: Self-pay | Admitting: Cardiology

## 2016-05-03 ENCOUNTER — Ambulatory Visit (INDEPENDENT_AMBULATORY_CARE_PROVIDER_SITE_OTHER): Payer: BLUE CROSS/BLUE SHIELD | Admitting: Cardiology

## 2016-05-03 VITALS — BP 110/80 | HR 82 | Ht 70.0 in | Wt 229.0 lb

## 2016-05-03 DIAGNOSIS — E785 Hyperlipidemia, unspecified: Secondary | ICD-10-CM

## 2016-05-03 DIAGNOSIS — Z720 Tobacco use: Secondary | ICD-10-CM | POA: Diagnosis not present

## 2016-05-03 DIAGNOSIS — I2510 Atherosclerotic heart disease of native coronary artery without angina pectoris: Secondary | ICD-10-CM | POA: Diagnosis not present

## 2016-05-03 DIAGNOSIS — I249 Acute ischemic heart disease, unspecified: Secondary | ICD-10-CM | POA: Diagnosis not present

## 2016-05-03 MED ORDER — ATORVASTATIN CALCIUM 40 MG PO TABS: 40.0000 mg | ORAL_TABLET | Freq: Every day | ORAL | 3 refills | Status: DC

## 2016-05-03 NOTE — Progress Notes (Signed)
05/03/2016 Derrick Turner   01-May-1970  [id number]  Primary Physician Deloria Lair, MD Primary Cardiologist: Dr Sallyanne Kuster  HPI:  46 y/o male seen in consult by Dr Sallyanne Kuster if Feb 2018 after he presented to the ED with chest pain and dyspnea. He was treated with [facility] and seen by cardiology in consult. The pt has a strong family history of CAD with his father having an MI in his 41's. He ruled out for an MI. Echo was normal. He had a treadmill that was normal by his EKG but he did have chest pain and Dr Sallyanne Kuster suggested an OP cath. This was done 04/06/16 by Dr Burt Knack and revealed moderate disease. The pt is in the office today for follow up. 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 His wife works, she is a Pharmacist, hospital. He continues to smoke but is trying to cut back. He denies any further chest pain or SOB.     Current Outpatient Prescriptions  Medication Sig Dispense Refill  . aspirin EC 81 MG EC tablet Take 1 tablet (81 mg total) by mouth daily. 30 tablet 0  . cetirizine (ZYRTEC) 10 MG tablet Take 10 mg by mouth daily.    Marland Kitchen gabapentin (NEURONTIN) 300 MG capsule Take 300 mg by mouth 2 (two) times daily.  1  . guaiFENesin (MUCINEX) 600 MG 12 hr tablet Take 600 mg by mouth 2 (two) times daily as needed for cough or to loosen phlegm.    . meloxicam (MOBIC) 7.5 MG tablet Take 7.5 mg by mouth daily.    . methocarbamol (ROBAXIN) 750 MG tablet Take 750 mg by mouth daily as needed for spasms.  1  . metoprolol tartrate (LOPRESSOR) 25 MG tablet Take 0.5 tablets (12.5 mg total) by mouth 2 (two) times daily. 60 tablet 0  . nitroGLYCERIN (NITROSTAT) 0.4 MG SL tablet Place 1 tablet (0.4 mg total) under the tongue every 5 (five) minutes x 3 doses as needed for chest pain. 30 tablet 12  . pantoprazole (PROTONIX) 40 MG tablet Take 1 tablet (40 mg total) by mouth daily. 14 tablet 0  . traMADol (ULTRAM) 50 MG tablet Take 50 mg by mouth  every 12 (twelve) hours as needed for moderate pain.   0  . traZODone (DESYREL) 50 MG tablet Take 100 mg by mouth at bedtime. 2 TABS    . atorvastatin (LIPITOR) 40 MG tablet Take 1 tablet (40 mg total) by mouth daily. 30 tablet 3   No current facility-administered medications for this visit.     No Known Allergies  Past Medical History:  Diagnosis Date  . History of kidney stones     Social History   Social History  . Marital status: Married    Spouse name: N/A  . Number of children: N/A  . Years of education: N/A   Occupational History  . Not on file.   Social History Main Topics  . Smoking status: Current Every Day Smoker    Packs/day: 1.00    Years: 30.00    Types: Cigarettes  . Smokeless tobacco: Never Used  . Alcohol use Yes  . Drug use: No  . Sexual activity: Not on file   Other Topics Concern  . Not on file   Social History Narrative  . No narrative on file     Family History  Problem Relation Age of Onset  . Heart attack Father   .  Heart attack Maternal Grandmother   . Heart attack Maternal Grandfather      Review of Systems: General: negative for chills, fever, night sweats or weight changes.  Cardiovascular: negative for chest pain, dyspnea on exertion, edema, orthopnea, palpitations, paroxysmal nocturnal dyspnea or shortness of breath Dermatological: negative for rash Respiratory: negative for cough or wheezing Urologic: negative for hematuria Abdominal: negative for nausea, vomiting, diarrhea, bright red blood per rectum, melena, or hematemesis Neurologic: negative for visual changes, syncope, or dizziness All other systems reviewed and are otherwise negative except as noted above.    Blood pressure 110/80, pulse 82, height 5\' 10"  (1.778 m), weight 229 lb (103.9 kg).  General appearance: alert, cooperative, no distress and mildly obese Neck: no carotid bruit and no JVD Lungs: clear to auscultation bilaterally Heart: regular rate and  rhythm Extremities: extremities normal, atraumatic, no cyanosis or edema Skin: Skin color, texture, turgor normal. No rashes or lesions Neurologic: Grossly normal   ASSESSMENT AND PLAN:   Dyslipidemia LAD goal < 70- his LDL was 105, Lipitor 40 mg added  ACS (acute coronary syndrome) [facility]) Admitted Feb 2018  CAD (coronary artery disease) Moderate CAD at cath 04/06/16- medical rx  Tobacco abuse Encouraged to continue his efforts at smoking cessation   PLAN  Check lipids and LFTs 3 months- f/u with Dr Sallyanne Kuster after.   Kerin [facility] PA-C 05/03/2016 11:05 AM

## 2016-05-03 NOTE — Assessment & Plan Note (Signed)
Encouraged to continue his efforts at smoking cessation

## 2016-05-03 NOTE — Assessment & Plan Note (Signed)
Moderate CAD at cath 04/06/16- medical rx

## 2016-05-03 NOTE — Patient Instructions (Addendum)
Medication Instructions:  STOP Carafate START Lipitor (Atorvastatin) 40mg  Take 1 tablet by mouth once a day  Labwork: Your physician recommends that you return for lab work in: 3 MONTHS FASTING CMET, LIPID-COMEPLETE [place]  Testing/Procedures: NONE   Follow-Up: Your physician recommends that you schedule a follow-up appointment in: [place].   Any Other Special Instructions Will Be Listed Below (If Applicable).     If you need a refill on your cardiac medications before your next appointment, please call your pharmacy.

## 2016-05-03 NOTE — Assessment & Plan Note (Signed)
Admitted Feb 2018

## 2016-05-03 NOTE — Assessment & Plan Note (Addendum)
LAD goal < 70- his LDL was 105, Lipitor 40 mg added

## 2016-05-19 ENCOUNTER — Other Ambulatory Visit: Payer: Self-pay | Admitting: Family Medicine

## 2016-05-22 ENCOUNTER — Other Ambulatory Visit: Payer: Self-pay | Admitting: Family Medicine

## 2016-06-07 ENCOUNTER — Other Ambulatory Visit: Payer: Self-pay | Admitting: *Deleted

## 2016-06-07 DIAGNOSIS — E785 Hyperlipidemia, unspecified: Secondary | ICD-10-CM

## 2016-06-07 MED ORDER — ATORVASTATIN CALCIUM 40 MG PO TABS: 40.0000 mg | ORAL_TABLET | Freq: Every day | ORAL | 1 refills | Status: DC

## 2016-07-03 NOTE — Progress Notes (Signed)
Cardiology Office Note:    Date:  07/15/2016   ID:  CHUCK CABAN, DOB 1970-08-20, MRN [id number]  PCP:  Deloria Lair., MD  Cardiologist:  Sanda Klein, MD    Referring MD: Deloria Lair., MD   Chief Complaint  Patient presents with  . Follow-up    History of Present Illness:    Derrick Turner is a 46 y.o. male with a hx of moderate CAD by cath in February 2018, for which medical therapy was recommended. He is trying to quit smoking. Pack of cigarettes now lasts about 3 days. His wife only smokes an occasional cigarette in the evenings. I suggested July 4 might be a good target date to quit smoking permanently. He has nicotine patches Now taking a statin for hyperlipidemia. Labs checked by PCP, don't have a copy right now, but he was told that his bad cholesterol was in range but his good cholesterol was still low.  The patient specifically denies any chest pain at rest exertion, dyspnea at rest or with exertion, orthopnea, paroxysmal nocturnal dyspnea, syncope, palpitations, focal neurological deficits, intermittent claudication, lower extremity edema, unexplained weight gain, cough, hemoptysis or wheezing.     Past Medical History:  Diagnosis Date  . History of kidney stones     Past Surgical History:  Procedure Laterality Date  . APPENDECTOMY    . CHOLECYSTECTOMY    . LEFT HEART CATH AND CORONARY ANGIOGRAPHY N/A 04/06/2016   Procedure: Left Heart Cath and Coronary Angiography;  Surgeon: Sherren Mocha, MD;  Location: [facility];  Service: Cardiovascular;  Laterality: N/A;  . SHOULDER SURGERY    . testectomy      Current Medications: Current Meds  Medication Sig  . aspirin 81 MG EC tablet TAKE 1 TABLET (81 MG TOTAL) BY MOUTH DAILY.  Marland Kitchen atorvastatin (LIPITOR) 40 MG tablet Take 1 tablet (40 mg total) by mouth daily.  . cetirizine (ZYRTEC) 10 MG tablet Take 10 mg by mouth daily.  Marland Kitchen gabapentin (NEURONTIN) 300 MG capsule Take 300 mg by mouth 2 (two) times  daily.  Marland Kitchen guaiFENesin (MUCINEX) 600 MG 12 hr tablet Take 600 mg by mouth 2 (two) times daily as needed for cough or to loosen phlegm.  . meloxicam (MOBIC) 7.5 MG tablet Take 7.5 mg by mouth daily.  . methocarbamol (ROBAXIN) 750 MG tablet Take 750 mg by mouth daily as needed for spasms.  . metoprolol tartrate (LOPRESSOR) 25 MG tablet Take 0.5 tablets (12.5 mg total) by mouth 2 (two) times daily.  . nitroGLYCERIN (NITROSTAT) 0.4 MG SL tablet Place 1 tablet (0.4 mg total) under the tongue every 5 (five) minutes x 3 doses as needed for chest pain.  . pantoprazole (PROTONIX) 40 MG tablet Take 1 tablet (40 mg total) by mouth daily.  . traMADol (ULTRAM) 50 MG tablet Take 50 mg by mouth every 12 (twelve) hours as needed for moderate pain.   . traZODone (DESYREL) 50 MG tablet Take 100 mg by mouth at bedtime. 2 TABS    He reports that he is not taking metoprolol  Allergies:   Patient has no known allergies.   Social History   Social History  . Marital status: Married    Spouse name: N/A  . Number of children: N/A  . Years of education: N/A   Social History Main Topics  . Smoking status: Current Every Day Smoker    Packs/day: 1.00    Years: 30.00    Types: Cigarettes  . Smokeless  tobacco: Never Used  . Alcohol use Yes  . Drug use: No  . Sexual activity: Not Asked   Other Topics Concern  . None   Social History Narrative  . None     Family History: The patient'sfamily history includes Heart attack in his father, maternal grandfather, and maternal grandmother. ROS:   Please see the history of present illness.     All other systems reviewed and are negative.  EKGs/Labs/Other Studies Reviewed:    EKG:  EKG is ordered today.  The ekg ordered today demonstrates Sinus rhythm, incomplete right bundle branch block (QRS 110 ms), QTC 421 ms, normal tracing  Recent Labs: 03/31/2016: ALT 21; B Natriuretic Peptide 39.9; Magnesium 1.8; TSH 1.333 04/01/2016: BUN 11; Creatinine, Ser 0.76;  Hemoglobin 14.8; Platelets 141; Potassium 4.4; Sodium 138   Recent Lipid Panel    Component Value Date/Time   CHOL 147 04/01/2016 0428   TRIG 61 04/01/2016 0428   HDL 30 (L) 04/01/2016 0428   CHOLHDL 4.9 04/01/2016 0428   VLDL 12 04/01/2016 0428   LDLCALC 105 (H) 04/01/2016 0428    Physical Exam:    VS:  BP (!) 102/58 (BP Location: Left Arm, Patient Position: Sitting, Cuff Size: Large)   Pulse 68   Ht 5\' 10"  (1.778 m)   Wt 237 lb (107.5 kg)   BMI 34.01 kg/m     Wt Readings from Last 3 Encounters:  07/15/16 237 lb (107.5 kg)  05/03/16 229 lb (103.9 kg)  04/06/16 235 lb (106.6 kg)     GEN:  Well nourished, well developed in no acute distress HEENT: Normal NECK: No JVD; No carotid bruits LYMPHATICS: No lymphadenopathy CARDIAC: RRR, no murmurs, rubs, gallops RESPIRATORY:  Clear to auscultation without rales, wheezing or rhonchi  ABDOMEN: Soft, non-tender, non-distended MUSCULOSKELETAL:  No edema; No deformity  SKIN: Warm and dry NEUROLOGIC:  Alert and oriented x 3 PSYCHIATRIC:  Normal affect   ASSESSMENT:    1. Coronary artery disease involving native coronary artery of native heart without angina pectoris   2. Dyslipidemia   3. Tobacco abuse    PLAN:    In order of problems listed above:  1. CAD: He did not have ECG changes during treadmill stress testing, but he did develop chest discomfort. Cardiac catheterization showed only moderate CAD and medical therapy was recommended. He has normal left ventricular systolic and diastolic function and no significant valvular problems. The focus is on treatment of risk factors, especially smoking 2. HLP: Compliant with statin. Repeat lipid profile reportedly still shows a low HDL, but good LDL level. Review the fact that weight loss and exercise of the best way to improve his HDL cholesterol level. This will be a challenge in the near future, smoking cessation will likely make him gain weight, his job as lead to numerous  injuries and arthritis and limits his mobility to some degree. 3. Smoking cessation: I think this is the most important near-term target. Suggest the July 4 is a good day to smoke his last cigarette. He believes his wife will be very supportive and will also quit smoking. 4. Obesity: Once we have achieved smoking cessation we'll then focus on improving his exercise level and weight loss. Discussed limiting the intake of sweets and starches, carbohydrates with high glycemic index.   Medication Adjustments/Labs and Tests Ordered: Current medicines are reviewed at length with the patient today.  Concerns regarding medicines are outlined above. Labs and tests ordered and medication changes are outlined in  the patient instructions below:  Patient Instructions  Dr Sallyanne Kuster recommends that you schedule a follow-up appointment in 12 months. You will receive a reminder letter in the mail two months in advance. If you don't receive a letter, please call our office to schedule the follow-up appointment.  If you need a refill on your cardiac medications before your next appointment, please call your pharmacy.  Your physician discussed the hazards of tobacco use. Tobacco use cessation is recommended and techniques and options to help you quit were discussed.    Signed, Sanda Klein, MD  07/15/2016 10:04 AM    [facility]

## 2016-07-15 ENCOUNTER — Encounter (INDEPENDENT_AMBULATORY_CARE_PROVIDER_SITE_OTHER): Payer: Self-pay

## 2016-07-15 ENCOUNTER — Encounter: Payer: Self-pay | Admitting: Cardiovascular Disease

## 2016-07-15 ENCOUNTER — Ambulatory Visit (INDEPENDENT_AMBULATORY_CARE_PROVIDER_SITE_OTHER): Payer: BLUE CROSS/BLUE SHIELD | Admitting: Cardiovascular Disease

## 2016-07-15 VITALS — BP 102/58 | HR 68 | Ht 70.0 in | Wt 237.0 lb

## 2016-07-15 DIAGNOSIS — Z720 Tobacco use: Secondary | ICD-10-CM | POA: Diagnosis not present

## 2016-07-15 DIAGNOSIS — E668 Other obesity: Secondary | ICD-10-CM | POA: Diagnosis not present

## 2016-07-15 DIAGNOSIS — I2510 Atherosclerotic heart disease of native coronary artery without angina pectoris: Secondary | ICD-10-CM

## 2016-07-15 DIAGNOSIS — E785 Hyperlipidemia, unspecified: Secondary | ICD-10-CM

## 2016-07-15 NOTE — Patient Instructions (Signed)
Dr Croitoru recommends that you schedule a follow-up appointment in 12 months. You will receive a reminder letter in the mail two months in advance. If you don't receive a letter, please call our office to schedule the follow-up appointment.  If you need a refill on your cardiac medications before your next appointment, please call your pharmacy.   Your physician discussed the hazards of tobacco use. Tobacco use cessation is recommended and techniques and options to help you quit were discussed. 

## 2016-12-29 ENCOUNTER — Other Ambulatory Visit: Payer: Self-pay | Admitting: Cardiology

## 2016-12-29 DIAGNOSIS — E785 Hyperlipidemia, unspecified: Secondary | ICD-10-CM

## 2017-07-04 ENCOUNTER — Other Ambulatory Visit: Payer: Self-pay | Admitting: Cardiology

## 2017-07-04 DIAGNOSIS — E785 Hyperlipidemia, unspecified: Secondary | ICD-10-CM

## 2017-07-04 NOTE — Telephone Encounter (Signed)
Rx sent to pharmacy   

## 2017-09-26 DIAGNOSIS — F17210 Nicotine dependence, cigarettes, uncomplicated: Secondary | ICD-10-CM | POA: Insufficient documentation

## 2017-09-26 DIAGNOSIS — E782 Mixed hyperlipidemia: Secondary | ICD-10-CM | POA: Insufficient documentation

## 2017-09-26 DIAGNOSIS — G8929 Other chronic pain: Secondary | ICD-10-CM | POA: Insufficient documentation

## 2017-10-08 ENCOUNTER — Other Ambulatory Visit: Payer: Self-pay | Admitting: Cardiovascular Disease

## 2017-10-08 DIAGNOSIS — E785 Hyperlipidemia, unspecified: Secondary | ICD-10-CM

## 2017-10-11 NOTE — Telephone Encounter (Signed)
Rx request sent to pharmacy.  

## 2018-01-14 ENCOUNTER — Other Ambulatory Visit: Payer: Self-pay | Admitting: Cardiovascular Disease

## 2018-01-14 DIAGNOSIS — E785 Hyperlipidemia, unspecified: Secondary | ICD-10-CM

## 2019-06-19 ENCOUNTER — Ambulatory Visit: Payer: BLUE CROSS/BLUE SHIELD | Admitting: Physician Assistant

## 2019-07-09 NOTE — Progress Notes (Deleted)
Cardiology Office Note   Date:  07/09/2019   ID:  Derrick Turner, DOB March 10, 1970, MRN [id number]  PCP:  Deloria Lair., MD  Cardiologist:  Sanda Klein, MD EP: None  No chief complaint on file.     History of Present Illness: Derrick Turner is a 49 y.o. male with a PMH of non-obstructive CAD, HLD, obesity, and tobacco abuse, who presents for routine follow-up.  He was last evaluated by cardiology at an outpatient visit with [name] 07/15/2016, at which time he was doing well from a cardiology standpoint and was working towards quitting smoking. No medication changes occurred and he was recommended for ongoing efforts to lose weight and follow-up in 1 year, though was lost to follow-up. His last ischemic evaluation was a LHC in 2018 which showed 25% pRCA stenosis, 30% m-dRCA stenosis, 25% p-mLCx stenosis, and 40% mLAD stenosis. Echo in 2018 showed EF 55-60%, normal LV diastolic function, no RWMA, and no significant valvular abnormalities.   He presents today for routine follow-up.  1. Non-obstructive CAD: mild-moderate diffuse CAD noted on LHC in 2018. No anginal complaints - Continue aspirin - Continue aggressive risk factor modifications below: goal BP <130/80, LDL <70, A1C <7, and smoking cessation  2. HLD: No recent lipids on file - Continue atorvastatin  3. Tobacco abuse: still smoking *** - Continue to encourage cessation  4. Obesity: BMI *** - Continue to encourage healthy dietary/lifestyle modifications to promote weight loss.    Past Medical History:  Diagnosis Date  . History of kidney stones     Past Surgical History:  Procedure Laterality Date  . APPENDECTOMY    . CHOLECYSTECTOMY    . LEFT HEART CATH AND CORONARY ANGIOGRAPHY N/A 04/06/2016   Procedure: Left Heart Cath and Coronary Angiography;  Surgeon: Sherren Mocha, MD;  Location: [facility];  Service: Cardiovascular;  Laterality: N/A;  . SHOULDER SURGERY    . testectomy        Current Outpatient Medications  Medication Sig Dispense Refill  . aspirin 81 MG EC tablet TAKE 1 TABLET (81 MG TOTAL) BY MOUTH DAILY. 90 tablet 3  . atorvastatin (LIPITOR) 40 MG tablet Take 1 tablet (40 mg total) by mouth daily. Please call to make appointment for further refills. 30 tablet 0  . cetirizine (ZYRTEC) 10 MG tablet Take 10 mg by mouth daily.    . CV[employer] NICOTINE 21 MG/24HR patch Place 1 patch onto the skin daily.  2  . gabapentin (NEURONTIN) 300 MG capsule Take 300 mg by mouth 2 (two) times daily.  1  . guaiFENesin (MUCINEX) 600 MG 12 hr tablet Take 600 mg by mouth 2 (two) times daily as needed for cough or to loosen phlegm.    . meloxicam (MOBIC) 7.5 MG tablet Take 7.5 mg by mouth daily.    . methocarbamol (ROBAXIN) 750 MG tablet Take 750 mg by mouth daily as needed for spasms.  1  . nitroGLYCERIN (NITROSTAT) 0.4 MG SL tablet Place 1 tablet (0.4 mg total) under the tongue every 5 (five) minutes x 3 doses as needed for chest pain. 30 tablet 12  . pantoprazole (PROTONIX) 40 MG tablet Take 1 tablet (40 mg total) by mouth daily. 14 tablet 0  . traMADol (ULTRAM) 50 MG tablet Take 50 mg by mouth every 12 (twelve) hours as needed for moderate pain.   0  . traZODone (DESYREL) 50 MG tablet Take 100 mg by mouth at bedtime. 2 TABS  No current facility-administered medications for this visit.    Allergies:   Patient has no known allergies.    Social History:  The patient  reports that he has been smoking cigarettes. He has a 30.00 pack-year smoking history. He has never used smokeless tobacco. He reports current alcohol use. He reports that he does not use drugs.   Family History:  The patient's ***family history includes Heart attack in his father, maternal grandfather, and maternal grandmother.    ROS:  Please see the history of present illness.   Otherwise, review of systems are positive for {NONE DEFAULTED:18576::"none"}.   All other systems are reviewed and negative.     PHYSICAL EXAM: VS:  There were no vitals taken for this visit. , BMI There is no height or weight on file to calculate BMI. GEN: Well nourished, well developed, in no acute distress HEENT: normal Neck: no JVD, carotid bruits, or masses Cardiac: ***RRR; no murmurs, rubs, or gallops,no edema  Respiratory:  clear to auscultation bilaterally, normal work of breathing GI: soft, nontender, nondistended, + BS MS: no deformity or atrophy Skin: warm and dry, no rash Neuro:  Strength and sensation are intact Psych: euthymic mood, full affect   EKG:  EKG {ACTION; IS/IS GI:087931 ordered today. The ekg ordered today demonstrates ***   Recent Labs: No results found for requested labs within last 8760 hours.    Lipid Panel    Component Value Date/Time   CHOL 147 04/01/2016 0428   TRIG 61 04/01/2016 0428   HDL 30 (L) 04/01/2016 0428   CHOLHDL 4.9 04/01/2016 0428   VLDL 12 04/01/2016 0428   LDLCALC 105 (H) 04/01/2016 0428      Wt Readings from Last 3 Encounters:  07/15/16 237 lb (107.5 kg)  05/03/16 229 lb (103.9 kg)  04/06/16 235 lb (106.6 kg)      Other studies Reviewed: Additional studies/ records that were reviewed today include:   Left heart catheterization 2018:  Prox RCA lesion, 25 %stenosed.  Mid RCA lesion, 30 %stenosed.  Dist RCA lesion, 30 %stenosed.  Prox Cx to Mid Cx lesion, 25 %stenosed.  Mid LAD lesion, 40 %stenosed.   Diffuse nonobstructive CAD  Suspect noncardiac chest pain. Recommend aggressive medical therapy/tobacco cessation/risk reduction measures.  Echocardiogram 2018: - Left ventricle: The cavity size was moderately dilated. Systolic  function was normal. The estimated ejection fraction was in the  range of 55% to 60%. Wall motion was normal; there were no  regional wall motion abnormalities. Left ventricular diastolic  function parameters were normal.  - Aortic valve: Trileaflet; normal thickness, mildly calcified   leaflets.  - Mitral valve: There was trivial regurgitation.  - Pulmonary arteries: Systolic pressure could not be accurately  estimated.     ASSESSMENT AND PLAN:  1.  ***   Current medicines are reviewed at length with the patient today.  The patient {ACTIONS; HAS/DOES NOT HAVE:19233} concerns regarding medicines.  The following changes have been made:  {PLAN; NO CHANGE:13088:s}  Labs/ tests ordered today include: *** No orders of the defined types were placed in this encounter.    Disposition:   FU with *** in {gen number [id number] {Days to years:10300}  Signed, Abigail Butts, PA-C  07/09/2019 10:01 PM

## 2019-07-11 ENCOUNTER — Ambulatory Visit: Payer: Self-pay | Admitting: Medical

## 2019-07-26 ENCOUNTER — Other Ambulatory Visit: Payer: Self-pay

## 2019-07-26 DIAGNOSIS — I2510 Atherosclerotic heart disease of native coronary artery without angina pectoris: Secondary | ICD-10-CM

## 2019-07-26 DIAGNOSIS — E785 Hyperlipidemia, unspecified: Secondary | ICD-10-CM

## 2019-07-26 LAB — COMPREHENSIVE METABOLIC PANEL
ALT: 17 IU/L (ref 0–44)
AST: 16 IU/L (ref 0–40)
Albumin/Globulin Ratio: 1.8 (ref 1.2–2.2)
Albumin: 4.6 g/dL (ref 3.8–4.9)
Alkaline Phosphatase: 99 IU/L (ref 48–121)
BUN/Creatinine Ratio: 25 — ABNORMAL HIGH (ref 9–20)
BUN: 19 mg/dL (ref 6–24)
Bilirubin Total: 0.7 mg/dL (ref 0.0–1.2)
CO2: 24 mmol/L (ref 20–29)
Calcium: 9.1 mg/dL (ref 8.7–10.2)
Chloride: 100 mmol/L (ref 96–106)
Creatinine, Ser: 0.76 mg/dL (ref 0.76–1.27)
GFR calc Af Amer: 122 mL/min/{1.73_m2} (ref >59)
GFR calc non Af Amer: 106 mL/min/{1.73_m2} (ref >59)
Globulin, Total: 2.5 g/dL (ref 1.5–4.5)
Glucose: 98 mg/dL (ref 65–99)
Potassium: 4.8 mmol/L (ref 3.5–5.2)
Sodium: 140 mmol/L (ref 134–144)
Total Protein: 7.1 g/dL (ref 6.0–8.5)

## 2019-07-26 LAB — LIPID PANEL
Chol/HDL Ratio: 5.5 ratio — ABNORMAL HIGH (ref 0.0–5.0)
Cholesterol, Total: 166 mg/dL (ref 100–199)
HDL: 30 mg/dL — ABNORMAL LOW (ref >39)
LDL Chol Calc (NIH): 121 mg/dL — ABNORMAL HIGH (ref 0–99)
Triglycerides: 79 mg/dL (ref 0–149)
VLDL Cholesterol Cal: 15 mg/dL (ref 5–40)

## 2019-07-30 ENCOUNTER — Other Ambulatory Visit: Payer: Self-pay

## 2019-07-30 ENCOUNTER — Ambulatory Visit: Payer: BC Managed Care – PPO | Admitting: Physician Assistant

## 2019-07-30 ENCOUNTER — Encounter: Payer: Self-pay | Admitting: Physician Assistant

## 2019-07-30 VITALS — BP 128/66 | HR 91 | Ht 70.0 in | Wt 229.8 lb

## 2019-07-30 DIAGNOSIS — Z720 Tobacco use: Secondary | ICD-10-CM | POA: Diagnosis not present

## 2019-07-30 DIAGNOSIS — E785 Hyperlipidemia, unspecified: Secondary | ICD-10-CM | POA: Diagnosis not present

## 2019-07-30 DIAGNOSIS — R7303 Prediabetes: Secondary | ICD-10-CM

## 2019-07-30 DIAGNOSIS — I2510 Atherosclerotic heart disease of native coronary artery without angina pectoris: Secondary | ICD-10-CM

## 2019-07-30 MED ORDER — ATORVASTATIN CALCIUM 40 MG PO TABS: 40.0000 mg | ORAL_TABLET | Freq: Every day | ORAL | 3 refills | Status: DC

## 2019-07-30 NOTE — Progress Notes (Signed)
Cardiology Office Note:    Date:  08/01/2019   ID:  Derrick Turner, DOB Dec 07, 1970, MRN [id number]  PCP:  Deloria Lair., MD  [facility] Cardiologist:  Sanda Klein, MD  [place] Electrophysiologist:  None   Referring MD: Deloria Lair., MD   No chief complaint on file.   History of Present Illness:    Derrick Turner is a 49 y.o. male with a hx of CAD, tobacco abuse, and hyperlipidemia.  He had a cardiac catheterization in February 2018 that showed moderate CAD.  Medical therapy was recommended.  He was still smoking at the time of the last office visit.  However he was trying to quit.  Unfortunately since the previous office visit in June 2018, he has not returned since.  Hemoglobin A1c obtained in February 2018 was borderline high at 5.7 which placed the patient prediabetes range.  Patient presents today for delayed 3-year follow-up.  He denies any recent exertional chest pain or shortness of breath.  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  He has not been taking aspirin and Lipitor.  I will restart both.  His previous metoprolol was stopped prior to the last office visit.  Unfortunately he is still smoke about 1 to 1.5 packs/day.  We reviewed his previous cardiac catheterization report, I highly encouraged him to stop smoking.  He has demonstrated willingness to try.  Recent lipid panel shows uncontrolled cholesterol, he says he has not been taking the Lipitor for quite some time.  After restarting the aspirin Lipitor, he is to return in 2 to 40-month for fasting lipid panel and LFT along with hemoglobin A1c.   Past Medical History:  Diagnosis Date  . History of kidney stones     Past Surgical History:  Procedure Laterality Date  . APPENDECTOMY    . CHOLECYSTECTOMY    . LEFT HEART CATH AND CORONARY ANGIOGRAPHY N/A 04/06/2016   Procedure: Left Heart Cath and Coronary Angiography;  Surgeon: Sherren Mocha, MD;  Location:  [facility];  Service: Cardiovascular;  Laterality: N/A;  . SHOULDER SURGERY    . testectomy      Current Medications: Current Meds  Medication Sig  . atorvastatin (LIPITOR) 40 MG tablet Take 1 tablet (40 mg total) by mouth daily.  . cetirizine (ZYRTEC) 10 MG tablet Take 10 mg by mouth daily.  Marland Kitchen gabapentin (NEURONTIN) 300 MG capsule Take 300 mg by mouth 2 (two) times daily.  . nitroGLYCERIN (NITROSTAT) 0.4 MG SL tablet Place 1 tablet (0.4 mg total) under the tongue every 5 (five) minutes x 3 doses as needed for chest pain.  . [DISCONTINUED] atorvastatin (LIPITOR) 40 MG tablet Take 1 tablet (40 mg total) by mouth daily. Please call to make appointment for further refills.  . [DISCONTINUED] [facility] NICOTINE 21 MG/24HR patch Place 1 patch onto the skin daily.  . [DISCONTINUED] guaiFENesin (MUCINEX) 600 MG 12 hr tablet Take 600 mg by mouth 2 (two) times daily as needed for cough or to loosen phlegm.  . [DISCONTINUED] meloxicam (MOBIC) 7.5 MG tablet Take 7.5 mg by mouth daily.  . [DISCONTINUED] methocarbamol (ROBAXIN) 750 MG tablet Take 750 mg by mouth daily as needed for spasms.  . [DISCONTINUED] pantoprazole (PROTONIX) 40 MG tablet Take 1 tablet (40 mg total) by mouth daily.  . [DISCONTINUED] traMADol (ULTRAM) 50 MG tablet Take 50 mg by mouth every 12 (twelve) hours as needed for moderate pain.   . [DISCONTINUED]  traZODone (DESYREL) 50 MG tablet Take 100 mg by mouth at bedtime. 2 TABS     Allergies:   Patient has no known allergies.   Social History   Socioeconomic History  . Marital status: Married    Spouse name: Not on file  . Number of children: Not on file  . Years of education: Not on file  . Highest education level: Not on file  Occupational History  . Not on file  Tobacco Use  . Smoking status: Current Every Day Smoker    Packs/day: 1.00    Years: 30.00    Pack years: 30.00    Types: Cigarettes  . Smokeless tobacco: Never Used  Substance and Sexual Activity  .  Alcohol use: Yes  . Drug use: No  . Sexual activity: Not on file  Other Topics Concern  . Not on file  Social History Narrative  . Not on file   Social Determinants of Health   Financial Resource Strain:   . Difficulty of Paying Living Expenses:   Food Insecurity:   . Worried About Charity fundraiser in the Last Year:   . Arboriculturist in the Last Year:   Transportation Needs:   . Film/video editor (Medical):   Marland Kitchen Lack of Transportation (Non-Medical):   Physical Activity:   . Days of Exercise per Week:   . Minutes of Exercise per Session:   Stress:   . Feeling of Stress :   Social Connections:   . Frequency of Communication with Friends and Family:   . Frequency of Social Gatherings with Friends and Family:   . Attends Religious Services:   . Active Member of Clubs or Organizations:   . Attends Archivist Meetings:   Marland Kitchen Marital Status:      Family History: The patient's family history includes Heart attack in his father, maternal grandfather, and maternal grandmother.  ROS:   Please see the history of present illness.     All other systems reviewed and are negative.  EKGs/Labs/Other Studies Reviewed:    The following studies were reviewed today:  Cath 04/06/2016  Prox RCA lesion, 25 %stenosed.  Mid RCA lesion, 30 %stenosed.  Dist RCA lesion, 30 %stenosed.  Prox Cx to Mid Cx lesion, 25 %stenosed.  Mid LAD lesion, 40 %stenosed.   Diffuse nonobstructive CAD  Suspect noncardiac chest pain. Recommend aggressive medical therapy/tobacco cessation/risk reduction measures.   EKG:  EKG is ordered today.  The ekg ordered today demonstrates normal sinus rhythm, incomplete right bundle branch block.  Recent Labs: 07/26/2019: ALT 17; BUN 19; Creatinine, Ser 0.76; Potassium 4.8; Sodium 140  Recent Lipid Panel    Component Value Date/Time   CHOL 166 07/26/2019 0908   TRIG 79 07/26/2019 0908   HDL 30 (L) 07/26/2019 0908   CHOLHDL 5.5 (H) 07/26/2019  0908   CHOLHDL 4.9 04/01/2016 0428   VLDL 12 04/01/2016 0428   LDLCALC 121 (H) 07/26/2019 0908    Physical Exam:    VS:  BP 128/66   Pulse 91   Ht 5\' 10"  (1.778 m)   Wt 229 lb 12.8 oz (104.2 kg)   SpO2 91%   BMI 32.97 kg/m     Wt Readings from Last 3 Encounters:  07/30/19 229 lb 12.8 oz (104.2 kg)  07/15/16 237 lb (107.5 kg)  05/03/16 229 lb (103.9 kg)     GEN:  Well nourished, well developed in no acute distress HEENT: Normal NECK: No JVD; No  carotid bruits LYMPHATICS: No lymphadenopathy CARDIAC: RRR, no murmurs, rubs, gallops RESPIRATORY:  Clear to auscultation without rales, wheezing or rhonchi  ABDOMEN: Soft, non-tender, non-distended MUSCULOSKELETAL:  No edema; No deformity  SKIN: Warm and dry NEUROLOGIC:  Alert and oriented x 3 PSYCHIATRIC:  Normal affect   ASSESSMENT:    1. Coronary artery disease involving native coronary artery of native heart without angina pectoris   2. Dyslipidemia   3. Pre-diabetes   4. Hyperlipidemia, unspecified hyperlipidemia type   5. Tobacco abuse    PLAN:    In order of problems listed above:  1. CAD: Previous cardiac catheterization in 2018 showed nonobstructive disease.  Patient works as Clinical biochemist and has not experienced any recent exertional chest pain or shortness of breath  2. Hyperlipidemia: Recent lab work shows uncontrolled cholesterol, however patient says he has run out of Lipitor for sometimes now.  He is to restart Lipitor and repeat fasting lipid panel and LFT in 2 to 3 months  3. Prediabetes: Obtain hemoglobin A1c  4. Tobacco abuse: Unfortunately he continues to smoke.  Emphasis has been placed on the importance of tobacco cessation.   Medication Adjustments/Labs and Tests Ordered: Current medicines are reviewed at length with the patient today.  Concerns regarding medicines are outlined above.  Orders Placed This Encounter  Procedures  . Hemoglobin A1c  . Hepatic function panel  . Lipid panel  . EKG  12-Lead   Meds ordered this encounter  Medications  . atorvastatin (LIPITOR) 40 MG tablet    Sig: Take 1 tablet (40 mg total) by mouth daily.    Dispense:  90 tablet    Refill:  3    Patient Instructions  Medication Instructions:  Your physician recommends that you continue on your current medications as directed. Please refer to the Current Medication list given to you today.  *If you need a refill on your cardiac medications before your next appointment, please call your pharmacy*  Lab Work: Your physician recommends that you return for lab work in 2-3 MONTHS:   FASTING LIPID PANEL-DO NOT EAT OR DRINK PAST MIDNIGHT. OKAY TO DRINK WATER  HEMOGLOBIN A1C  HEPATIC (LIVER) FUNCTION TEST  If you have labs (blood work) drawn today and your tests are completely normal, you will receive your results only by: Marland Kitchen MyChart Message (if you have MyChart) OR . A paper copy in the mail If you have any lab test that is abnormal or we need to change your treatment, we will call you to review the results.  Testing/Procedures: NONE ordered at this time of appointment   Follow-Up: At [facility], you and your health needs are our priority.  As part of our continuing mission to provide you with exceptional heart care, we have created designated Provider Care Teams.  These Care Teams include your primary Cardiologist (physician) and Advanced Practice Providers (APPs -  Physician Assistants and Nurse Practitioners) who all work together to provide you with the care you need, when you need it.  We recommend signing up for the patient portal called "MyChart".  Sign up information is provided on this After Visit Summary.  MyChart is used to connect with patients for Virtual Visits (Telemedicine).  Patients are able to view lab/test results, encounter notes, upcoming appointments, etc.  Non-urgent messages can be sent to your provider as well.   To learn more about what you can do with MyChart, go to  [email or web address].    Your next appointment:   1 year(s)  The format for your next appointment:   In Person  Provider:   Sanda Klein, MD  Other Instructions     Steps to Quit Smoking Smoking tobacco is the leading cause of preventable death. It can affect almost every organ in the body. Smoking puts you and people around you at risk for many serious, long-lasting (chronic) diseases. Quitting smoking can be hard, but it is one of the best things that you can do for your health. It is never too late to quit. How do I get ready to quit? When you decide to quit smoking, make a plan to help you succeed. Before you quit:  Pick a date to quit. Set a date within the next 2 weeks to give you time to prepare.  Write down the reasons why you are quitting. Keep this list in places where you will see it often.  Tell your family, friends, and co-workers that you are quitting. Their support is important.  Talk with your doctor about the choices that may help you quit.  Find out if your health insurance will pay for these treatments.  Know the people, places, things, and activities that make you want to smoke (triggers). Avoid them. What first steps can I take to quit smoking?  Throw away all cigarettes at home, at work, and in your car.  Throw away the things that you use when you smoke, such as ashtrays and lighters.  Clean your car. Make sure to empty the ashtray.  Clean your home, including curtains and carpets. What can I do to help me quit smoking? Talk with your doctor about taking medicines and seeing a counselor at the same time. You are more likely to succeed when you do both.  If you are pregnant or breastfeeding, talk with your doctor about counseling or other ways to quit smoking. Do not take medicine to help you quit smoking unless your doctor tells you to do so. To quit smoking: Quit right away  Quit smoking totally, instead of slowly cutting back on how much  you smoke over a period of time.  Go to counseling. You are more likely to quit if you go to counseling sessions regularly. Take medicine You may take medicines to help you quit. Some medicines need a prescription, and some you can buy over-the-counter. Some medicines may contain a drug called nicotine to replace the nicotine in cigarettes. Medicines may:  Help you to stop having the desire to smoke (cravings).  Help to stop the problems that come when you stop smoking (withdrawal symptoms). Your doctor may ask you to use:  Nicotine patches, gum, or lozenges.  Nicotine inhalers or sprays.  Non-nicotine medicine that is taken by mouth. Find resources Find resources and other ways to help you quit smoking and remain smoke-free after you quit. These resources are most helpful when you use them often. They include:  Online chats with a Social worker.  Phone quitlines.  Printed Furniture conservator/restorer.  Support groups or group counseling.  Text messaging programs.  Mobile phone apps. Use apps on your mobile phone or tablet that can help you stick to your quit plan. There are many free apps for mobile phones and tablets as well as websites. Examples include Quit Guide from the [employer] and smokefree.gov  What things can I do to make it easier to quit?   Talk to your family and friends. Ask them to support and encourage you.  Call a phone quitline (1-800-QUIT-NOW), reach out to support groups,  or work with a Social worker.  Ask people who smoke to not smoke around you.  Avoid places that make you want to smoke, such as: ? Bars. ? Parties. ? Smoke-break areas at work.  Spend time with people who do not smoke.  Lower the stress in your life. Stress can make you want to smoke. Try these things to help your stress: ? Getting regular exercise. ? Doing deep-breathing exercises. ? Doing yoga. ? Meditating. ? Doing a body scan. To do this, close your eyes, focus on one area of your body at a time  from head to toe. Notice which parts of your body are tense. Try to relax the muscles in those areas. How will I feel when I quit smoking? Day 1 to 3 weeks Within the first 24 hours, you may start to have some problems that come from quitting tobacco. These problems are very bad 2-3 days after you quit, but they do not often last for more than 2-3 weeks. You may get these symptoms:  Mood swings.  Feeling restless, nervous, angry, or annoyed.  Trouble concentrating.  Dizziness.  Strong desire for high-sugar foods and nicotine.  Weight gain.  Trouble pooping (constipation).  Feeling like you may vomit (nausea).  Coughing or a sore throat.  Changes in how the medicines that you take for other issues work in your body.  Depression.  Trouble sleeping (insomnia). Week 3 and afterward After the first 2-3 weeks of quitting, you may start to notice more positive results, such as:  Better sense of smell and taste.  Less coughing and sore throat.  Slower heart rate.  Lower blood pressure.  Clearer skin.  Better breathing.  Fewer sick days. Quitting smoking can be hard. Do not give up if you fail the first time. Some people need to try a few times before they succeed. Do your best to stick to your quit plan, and talk with your doctor if you have any questions or concerns. Summary  Smoking tobacco is the leading cause of preventable death. Quitting smoking can be hard, but it is one of the best things that you can do for your health.  When you decide to quit smoking, make a plan to help you succeed.  Quit smoking right away, not slowly over a period of time.  When you start quitting, seek help from your doctor, family, or friends. This information is not intended to replace advice given to you by your health care provider. Make sure you discuss any questions you have with your health care provider. Document Revised: 10/19/2018 Document Reviewed: 04/14/2018 Elsevier Patient  Education  [place] with Quitting Smoking  Quitting smoking is a physical and mental challenge. You will face cravings, withdrawal symptoms, and temptation. Before quitting, work with your health care provider to make a plan that can help you cope. Preparation can help you quit and keep you from giving in. How can I cope with cravings? Cravings usually last for 5-10 minutes. If you get through it, the craving will pass. Consider taking the following actions to help you cope with cravings:  Keep your mouth busy: ? Chew sugar-free gum. ? Suck on hard candies or a straw. ? Brush your teeth.  Keep your hands and body busy: ? Immediately change to a different activity when you feel a craving. ? Squeeze or play with a ball. ? Do an activity or a hobby, like making bead jewelry, practicing needlepoint, or working with  wood. ? Mix up your normal routine. ? Take a short exercise break. Go for a quick walk or run up and down stairs. ? Spend time in public places where smoking is not allowed.  Focus on doing something kind or helpful for someone else.  Call a friend or family member to talk during a craving.  Join a support group.  Call a quit line, such as 1-800-QUIT-NOW.  Talk with your health care provider about medicines that might help you cope with cravings and make quitting easier for you. How can I deal with withdrawal symptoms? Your body may experience negative effects as it tries to get used to not having nicotine in the system. These effects are called withdrawal symptoms. They may include:  Feeling hungrier than normal.  Trouble concentrating.  Irritability.  Trouble sleeping.  Feeling depressed.  Restlessness and agitation.  Craving a cigarette. To manage withdrawal symptoms:  Avoid places, people, and activities that trigger your cravings.  Remember why you want to quit.  Get plenty of sleep.  Avoid coffee and other caffeinated drinks.  These may worsen some of your symptoms. How can I handle social situations? Social situations can be difficult when you are quitting smoking, especially in the first few weeks. To manage this, you can:  Avoid parties, bars, and other social situations where people might be smoking.  Avoid alcohol.  Leave right away if you have the urge to smoke.  Explain to your family and friends that you are quitting smoking. Ask for understanding and support.  Plan activities with friends or family where smoking is not an option. What are some ways I can cope with stress? Wanting to smoke may cause stress, and stress can make you want to smoke. Find ways to manage your stress. Relaxation techniques can help. For example:  Breathe slowly and deeply, in through your nose and out through your mouth.  Listen to soothing, relaxing music.  Talk with a family member or friend about your stress.  Light a candle.  Soak in a bath or take a shower.  Think about a peaceful place. What are some ways I can prevent weight gain? Be aware that many people gain weight after they quit smoking. However, not everyone does. To keep from gaining weight, have a plan in place before you quit and stick to the plan after you quit. Your plan should include:  Having healthy snacks. When you have a craving, it may help to: ? Eat plain popcorn, crunchy carrots, celery, or other cut vegetables. ? Chew sugar-free gum.  Changing how you eat: ? Eat small portion sizes at meals. ? Eat 4-6 small meals throughout the day instead of 1-2 large meals a day. ? Be mindful when you eat. Do not watch television or do other things that might distract you as you eat.  Exercising regularly: ? Make time to exercise each day. If you do not have time for a long workout, do short bouts of exercise for 5-10 minutes several times a day. ? Do some form of strengthening exercise, like weight lifting, and some form of aerobic exercise, like  running or swimming.  Drinking plenty of water or other low-calorie or no-calorie drinks. Drink 6-8 glasses of water daily, or as much as instructed by your health care provider. Summary  Quitting smoking is a physical and mental challenge. You will face cravings, withdrawal symptoms, and temptation to smoke again. Preparation can help you as you go through these challenges.  You  can cope with cravings by keeping your mouth busy (such as by chewing gum), keeping your body and hands busy, and making calls to family, friends, or a helpline for people who want to quit smoking.  You can cope with withdrawal symptoms by avoiding places where people smoke, avoiding drinks with caffeine, and getting plenty of rest.  Ask your health care provider about the different ways to prevent weight gain, avoid stress, and handle social situations. This information is not intended to replace advice given to you by your health care provider. Make sure you discuss any questions you have with your health care provider. Document Revised: 01/06/2017 Document Reviewed: 01/22/2016 Elsevier Patient Education  2020 [place], [place], [place]  08/01/2019 11:52 PM    [facility]

## 2019-07-30 NOTE — Patient Instructions (Signed)
Medication Instructions:  Your physician recommends that you continue on your current medications as directed. Please refer to the Current Medication list given to you today.  *If you need a refill on your cardiac medications before your next appointment, please call your pharmacy*  Lab Work: Your physician recommends that you return for lab work in 2-3 MONTHS:   FASTING LIPID PANEL-DO NOT EAT OR DRINK PAST MIDNIGHT. OKAY TO DRINK WATER  HEMOGLOBIN A1C  HEPATIC (LIVER) FUNCTION TEST  If you have labs (blood work) drawn today and your tests are completely normal, you will receive your results only by: Marland Kitchen MyChart Message (if you have MyChart) OR . A paper copy in the mail If you have any lab test that is abnormal or we need to change your treatment, we will call you to review the results.  Testing/Procedures: NONE ordered at this time of appointment   Follow-Up: At [facility], you and your health needs are our priority.  As part of our continuing mission to provide you with exceptional heart care, we have created designated Provider Care Teams.  These Care Teams include your primary Cardiologist (physician) and Advanced Practice Providers (APPs -  Physician Assistants and Nurse Practitioners) who all work together to provide you with the care you need, when you need it.  We recommend signing up for the patient portal called "MyChart".  Sign up information is provided on this After Visit Summary.  MyChart is used to connect with patients for Virtual Visits (Telemedicine).  Patients are able to view lab/test results, encounter notes, upcoming appointments, etc.  Non-urgent messages can be sent to your provider as well.   To learn more about what you can do with MyChart, go to [email or web address].    Your next appointment:   1 year(s)  The format for your next appointment:   In Person  Provider:   Sanda Klein, MD  Other Instructions     Steps to Quit Smoking Smoking  tobacco is the leading cause of preventable death. It can affect almost every organ in the body. Smoking puts you and people around you at risk for many serious, long-lasting (chronic) diseases. Quitting smoking can be hard, but it is one of the best things that you can do for your health. It is never too late to quit. How do I get ready to quit? When you decide to quit smoking, make a plan to help you succeed. Before you quit:  Pick a date to quit. Set a date within the next 2 weeks to give you time to prepare.  Write down the reasons why you are quitting. Keep this list in places where you will see it often.  Tell your family, friends, and co-workers that you are quitting. Their support is important.  Talk with your doctor about the choices that may help you quit.  Find out if your health insurance will pay for these treatments.  Know the people, places, things, and activities that make you want to smoke (triggers). Avoid them. What first steps can I take to quit smoking?  Throw away all cigarettes at home, at work, and in your car.  Throw away the things that you use when you smoke, such as ashtrays and lighters.  Clean your car. Make sure to empty the ashtray.  Clean your home, including curtains and carpets. What can I do to help me quit smoking? Talk with your doctor about taking medicines and seeing a counselor at the same time. You are more  likely to succeed when you do both.  If you are pregnant or breastfeeding, talk with your doctor about counseling or other ways to quit smoking. Do not take medicine to help you quit smoking unless your doctor tells you to do so. To quit smoking: Quit right away  Quit smoking totally, instead of slowly cutting back on how much you smoke over a period of time.  Go to counseling. You are more likely to quit if you go to counseling sessions regularly. Take medicine You may take medicines to help you quit. Some medicines need a prescription,  and some you can buy over-the-counter. Some medicines may contain a drug called nicotine to replace the nicotine in cigarettes. Medicines may:  Help you to stop having the desire to smoke (cravings).  Help to stop the problems that come when you stop smoking (withdrawal symptoms). Your doctor may ask you to use:  Nicotine patches, gum, or lozenges.  Nicotine inhalers or sprays.  Non-nicotine medicine that is taken by mouth. Find resources Find resources and other ways to help you quit smoking and remain smoke-free after you quit. These resources are most helpful when you use them often. They include:  Online chats with a Social worker.  Phone quitlines.  Printed Furniture conservator/restorer.  Support groups or group counseling.  Text messaging programs.  Mobile phone apps. Use apps on your mobile phone or tablet that can help you stick to your quit plan. There are many free apps for mobile phones and tablets as well as websites. Examples include Quit Guide from the [employer] and smokefree.gov  What things can I do to make it easier to quit?   Talk to your family and friends. Ask them to support and encourage you.  Call a phone quitline (1-800-QUIT-NOW), reach out to support groups, or work with a Social worker.  Ask people who smoke to not smoke around you.  Avoid places that make you want to smoke, such as: ? Bars. ? Parties. ? Smoke-break areas at work.  Spend time with people who do not smoke.  Lower the stress in your life. Stress can make you want to smoke. Try these things to help your stress: ? Getting regular exercise. ? Doing deep-breathing exercises. ? Doing yoga. ? Meditating. ? Doing a body scan. To do this, close your eyes, focus on one area of your body at a time from head to toe. Notice which parts of your body are tense. Try to relax the muscles in those areas. How will I feel when I quit smoking? Day 1 to 3 weeks Within the first 24 hours, you may start to have some  problems that come from quitting tobacco. These problems are very bad 2-3 days after you quit, but they do not often last for more than 2-3 weeks. You may get these symptoms:  Mood swings.  Feeling restless, nervous, angry, or annoyed.  Trouble concentrating.  Dizziness.  Strong desire for high-sugar foods and nicotine.  Weight gain.  Trouble pooping (constipation).  Feeling like you may vomit (nausea).  Coughing or a sore throat.  Changes in how the medicines that you take for other issues work in your body.  Depression.  Trouble sleeping (insomnia). Week 3 and afterward After the first 2-3 weeks of quitting, you may start to notice more positive results, such as:  Better sense of smell and taste.  Less coughing and sore throat.  Slower heart rate.  Lower blood pressure.  Clearer skin.  Better breathing.  Fewer  sick days. Quitting smoking can be hard. Do not give up if you fail the first time. Some people need to try a few times before they succeed. Do your best to stick to your quit plan, and talk with your doctor if you have any questions or concerns. Summary  Smoking tobacco is the leading cause of preventable death. Quitting smoking can be hard, but it is one of the best things that you can do for your health.  When you decide to quit smoking, make a plan to help you succeed.  Quit smoking right away, not slowly over a period of time.  When you start quitting, seek help from your doctor, family, or friends. This information is not intended to replace advice given to you by your health care provider. Make sure you discuss any questions you have with your health care provider. Document Revised: 10/19/2018 Document Reviewed: 04/14/2018 Elsevier Patient Education  [place] with Quitting Smoking  Quitting smoking is a physical and mental challenge. You will face cravings, withdrawal symptoms, and temptation. Before quitting, work with  your health care provider to make a plan that can help you cope. Preparation can help you quit and keep you from giving in. How can I cope with cravings? Cravings usually last for 5-10 minutes. If you get through it, the craving will pass. Consider taking the following actions to help you cope with cravings:  Keep your mouth busy: ? Chew sugar-free gum. ? Suck on hard candies or a straw. ? Brush your teeth.  Keep your hands and body busy: ? Immediately change to a different activity when you feel a craving. ? Squeeze or play with a ball. ? Do an activity or a hobby, like making bead jewelry, practicing needlepoint, or working with wood. ? Mix up your normal routine. ? Take a short exercise break. Go for a quick walk or run up and down stairs. ? Spend time in public places where smoking is not allowed.  Focus on doing something kind or helpful for someone else.  Call a friend or family member to talk during a craving.  Join a support group.  Call a quit line, such as 1-800-QUIT-NOW.  Talk with your health care provider about medicines that might help you cope with cravings and make quitting easier for you. How can I deal with withdrawal symptoms? Your body may experience negative effects as it tries to get used to not having nicotine in the system. These effects are called withdrawal symptoms. They may include:  Feeling hungrier than normal.  Trouble concentrating.  Irritability.  Trouble sleeping.  Feeling depressed.  Restlessness and agitation.  Craving a cigarette. To manage withdrawal symptoms:  Avoid places, people, and activities that trigger your cravings.  Remember why you want to quit.  Get plenty of sleep.  Avoid coffee and other caffeinated drinks. These may worsen some of your symptoms. How can I handle social situations? Social situations can be difficult when you are quitting smoking, especially in the first few weeks. To manage this, you can:  Avoid  parties, bars, and other social situations where people might be smoking.  Avoid alcohol.  Leave right away if you have the urge to smoke.  Explain to your family and friends that you are quitting smoking. Ask for understanding and support.  Plan activities with friends or family where smoking is not an option. What are some ways I can cope with stress? Wanting to smoke may cause  stress, and stress can make you want to smoke. Find ways to manage your stress. Relaxation techniques can help. For example:  Breathe slowly and deeply, in through your nose and out through your mouth.  Listen to soothing, relaxing music.  Talk with a family member or friend about your stress.  Light a candle.  Soak in a bath or take a shower.  Think about a peaceful place. What are some ways I can prevent weight gain? Be aware that many people gain weight after they quit smoking. However, not everyone does. To keep from gaining weight, have a plan in place before you quit and stick to the plan after you quit. Your plan should include:  Having healthy snacks. When you have a craving, it may help to: ? Eat plain popcorn, crunchy carrots, celery, or other cut vegetables. ? Chew sugar-free gum.  Changing how you eat: ? Eat small portion sizes at meals. ? Eat 4-6 small meals throughout the day instead of 1-2 large meals a day. ? Be mindful when you eat. Do not watch television or do other things that might distract you as you eat.  Exercising regularly: ? Make time to exercise each day. If you do not have time for a long workout, do short bouts of exercise for 5-10 minutes several times a day. ? Do some form of strengthening exercise, like weight lifting, and some form of aerobic exercise, like running or swimming.  Drinking plenty of water or other low-calorie or no-calorie drinks. Drink 6-8 glasses of water daily, or as much as instructed by your health care provider. Summary  Quitting smoking is a  physical and mental challenge. You will face cravings, withdrawal symptoms, and temptation to smoke again. Preparation can help you as you go through these challenges.  You can cope with cravings by keeping your mouth busy (such as by chewing gum), keeping your body and hands busy, and making calls to family, friends, or a helpline for people who want to quit smoking.  You can cope with withdrawal symptoms by avoiding places where people smoke, avoiding drinks with caffeine, and getting plenty of rest.  Ask your health care provider about the different ways to prevent weight gain, avoid stress, and handle social situations. This information is not intended to replace advice given to you by your health care provider. Make sure you discuss any questions you have with your health care provider. Document Revised: 01/06/2017 Document Reviewed: 01/22/2016 Elsevier Patient Education  2020 [employer].

## 2020-03-02 DIAGNOSIS — Z8616 Personal history of COVID-19: Secondary | ICD-10-CM

## 2020-03-02 HISTORY — DX: Personal history of COVID-19: Z86.16

## 2020-03-20 ENCOUNTER — Other Ambulatory Visit: Payer: Self-pay | Admitting: Urology

## 2020-03-20 DIAGNOSIS — N200 Calculus of kidney: Secondary | ICD-10-CM

## 2020-03-20 NOTE — Progress Notes (Signed)
Talked with patient instructions given. cl liquids until 0830 Positive covid test 3 weeks ago will bring results.wife is the driver. Arrival time 1030

## 2020-03-23 ENCOUNTER — Other Ambulatory Visit: Payer: Self-pay

## 2020-03-23 ENCOUNTER — Encounter (HOSPITAL_BASED_OUTPATIENT_CLINIC_OR_DEPARTMENT_OTHER): Payer: Self-pay | Admitting: Urology

## 2020-03-23 ENCOUNTER — Ambulatory Visit (HOSPITAL_COMMUNITY): Payer: BC Managed Care – PPO

## 2020-03-23 ENCOUNTER — Ambulatory Visit (HOSPITAL_BASED_OUTPATIENT_CLINIC_OR_DEPARTMENT_OTHER)
Admission: RE | Admit: 2020-03-23 | Discharge: 2020-03-23 | Disposition: A | Discharge status: Home / Self Care | Payer: BC Managed Care – PPO | Attending: Urology | Admitting: Urology

## 2020-03-23 ENCOUNTER — Encounter (HOSPITAL_BASED_OUTPATIENT_CLINIC_OR_DEPARTMENT_OTHER)
Admission: RE | Disposition: A | Discharge status: Home / Self Care | Payer: Self-pay | Source: Home / Self Care | Attending: Urology

## 2020-03-23 DIAGNOSIS — Z79899 Other long term (current) drug therapy: Secondary | ICD-10-CM | POA: Diagnosis not present

## 2020-03-23 DIAGNOSIS — N200 Calculus of kidney: Secondary | ICD-10-CM | POA: Diagnosis not present

## 2020-03-23 DIAGNOSIS — F17210 Nicotine dependence, cigarettes, uncomplicated: Secondary | ICD-10-CM | POA: Diagnosis not present

## 2020-03-23 HISTORY — PX: EXTRACORPOREAL SHOCK WAVE LITHOTRIPSY: SHX1557

## 2020-03-23 SURGERY — LITHOTRIPSY, ESWL
Anesthesia: LOCAL | Laterality: Right

## 2020-03-23 MED ORDER — DIAZEPAM 5 MG PO TABS
10.0000 mg | ORAL_TABLET | ORAL | Status: AC
  Administered 2020-03-23: 10 mg via ORAL

## 2020-03-23 MED ORDER — DIPHENHYDRAMINE HCL 25 MG PO CAPS
25.0000 mg | ORAL_CAPSULE | ORAL | Status: AC
  Administered 2020-03-23: 25 mg via ORAL

## 2020-03-23 MED ORDER — SODIUM CHLORIDE 0.9 % IV SOLN: INTRAVENOUS | Status: DC

## 2020-03-23 MED ORDER — CIPROFLOXACIN HCL 500 MG PO TABS
ORAL_TABLET | ORAL | Status: AC
  Filled 2020-03-23: qty 1

## 2020-03-23 MED ORDER — DIPHENHYDRAMINE HCL 25 MG PO CAPS
ORAL_CAPSULE | ORAL | Status: AC
  Filled 2020-03-23: qty 1

## 2020-03-23 MED ORDER — DIAZEPAM 5 MG PO TABS
ORAL_TABLET | ORAL | Status: AC
  Filled 2020-03-23: qty 2

## 2020-03-23 MED ORDER — TAMSULOSIN HCL 0.4 MG PO CAPS: 0.4000 mg | ORAL_CAPSULE | Freq: Every day | ORAL | 1 refills | Status: DC

## 2020-03-23 MED ORDER — CIPROFLOXACIN HCL 500 MG PO TABS
500.0000 mg | ORAL_TABLET | ORAL | Status: AC
  Administered 2020-03-23: 500 mg via ORAL

## 2020-03-23 NOTE — Op Note (Signed)
See [facility] OP note scanned into chart. Also because of the size, density, location and other factors that cannot be anticipated I feel this will likely be a staged procedure. This fact supersedes any indication in the scanned [facility] operative note to the contrary.  

## 2020-03-23 NOTE — H&P (Signed)
See scanned H&P

## 2020-03-24 ENCOUNTER — Encounter (HOSPITAL_BASED_OUTPATIENT_CLINIC_OR_DEPARTMENT_OTHER): Payer: Self-pay | Admitting: Urology

## 2020-04-10 ENCOUNTER — Encounter (HOSPITAL_BASED_OUTPATIENT_CLINIC_OR_DEPARTMENT_OTHER): Payer: Self-pay | Admitting: Urology

## 2020-04-10 ENCOUNTER — Other Ambulatory Visit: Payer: Self-pay | Admitting: Urology

## 2020-04-10 ENCOUNTER — Other Ambulatory Visit: Payer: Self-pay

## 2020-04-10 NOTE — Progress Notes (Signed)
Spoke w/ via phone for pre-op interview--- PT Lab needs dos---- no              Lab results------ current ekg in epic/ chart COVID test ------ pt tested positive 03-02-2020, copy of result in epic/ chart.  Per pt had mild symptoms that resolved Arrive at ------- 0945 on 04-14-2020 NPO after MN NO Solid Food.  Clear liquids from MN until--- 0845 Medications to take morning of surgery ----- Zyrtec, Lipitor, Colace, Flomax Diabetic medication ----- n/a Patient Special Instructions ----- n/a Pre-Op special Istructions ----- n/a Patient verbalized understanding of instructions that were given at this phone interview. Patient denies shortness of breath, chest pain, fever, cough at this phone interview.   Anesthesia :  CAD s/p cath 04-06-2016 no intervention;  Pt denied any cardiac s&s, sob, and no peripheral swelling.  Stated last nitro taken approx. 6 yrs ago.  PCP:  Dr D. Tapper Cardiologist :  Dr Sallyanne Kuster (loc 07-30-2019 epic) EKG : 07-30-2019 epic Echo : 04-01-2016 epic Stress test:  ETT 04-01-2016 epic Cardiac Cath :  04-06-2016 epic Activity level:  Denied sob w/ any activity Sleep Study/ CPAP :  NO Blood Thinner/ Instructions Maryjane Hurter Dose:  NO ASA / Instructions/ Last Dose :  ASA 81mg /  Pt stated told by dr Milford Cage office to stop 2 days prior to surgery,  Last dose to be 04-11-2020

## 2020-04-13 NOTE — H&P (Signed)
Seen here previously for lower abdominal, groin and testicular pain. He has hx of nephrolithiasis. 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 CT imaging here in 2017 indicated likely diverticulitis but there was a few small nonobstructing calculi noted. Seen recently by primary care earlier this week for periumbilical and lower abdominal/suprapubic pain. There were no voiding complaints. He had dipstick positive blood. A scrotal ultrasound was obtained indicating no acute abnormalities there was a small hydrocele as well as a mild varicocele on the left but no evidence of epididymitis or orchitis. He was given a prescription for Vicodin as well as told to begin ciprofloxacin 500 mg twice a day. Over the past couple of years we've seen him for similar complaints but there was no evidence of obstructive uropathy on exams. He was evaluated at [facility] for gross hematuria and groin pain in late 2017. I don't have image access but reports indicated non-obstructing stones on the right without obstructive signs. He had benign cystoscopy, negative urine cytology. He was referred to pelvic floor PT.    06/29/2017: Today he reports 6/10 constant aching pain in the scrotum and perineal area. He states over the past 3 years having intermittent right lower back and flank pain radiating into the groin as well as into the right hip. Symptoms typically seem to be worse with movement. I evaluated him for a similar complaint in 2017, January. He has not seen gross hematuria nor encountered any painful or uncomfortable urinating. Stable frequency and nocturia with no changes in urine stream or difficulty initiating such. Denies fevers or other constitutional signs or symptoms of infection. Denies stone material passage. He states the abx he started has not done much for his symptoms. He reports PT in early 2018 (referred by [facility]) was ineffective in treating his symptoms. He has  [place] on UA today. DRE benign. No palpable tenderness on GU exam.   03/17/2020: Last seen in May 2019 with the above noted symptoms and past GU hx. Presents today for evaluation of gross hematuria and back pain. Symptoms began approximately 1 week ago with acute onset of right sided mid to lower back pain. The symptoms have persisted until today's appointment. Denies radiation to the flank or lower abdomen. Rated as moderately severe. Hematuria began intermittently occurring 4-5 days ago. Not associated with painful or burning urination. He denies interval stone material passage. He reports maybe a mild increase in urinary frequency/urgency from baseline. He also self reports some decreased urine output. No increased hesitancy or straining. He denies associated fevers or chills, nausea/vomiting.   03/20/2020: The patient reports intermittent episodes of right-sided flank pain, but with no associated nausea/vomiting, fever/chills, dysuria or hematuria. UA today shows 3+ blood with few bacteria and no other signs of cystitis. He remains AFVSS today. He is currently using Toradol and hydrocodone as needed, which is marginally controlling his right-sided flank pain.   04/01/2020: 50 year old male who underwent a right ESWL for an 8 mm stone on 03/23/2020 presents today with severe right-sided pain. He has passed multiple stone fragments however is still experiencing severe pain and discomfort. He denies fevers and chills. He does have some associated nausea but this is controlled. He has not taken anything other than Toradol for his pain. He feels he is emptying his bladder well. He continues tamsulosin.   04/06/20: 51 year old who presents today for follow up after developing severe pain and obstructing stone fragments within his  right ureter post ESWL. He continues tamsulosin and toradol for pain. He has not passed any stone fragments but continues to have significant right flank and lower abdominal discomfort. He  denies fevers, chills, inability to empty his bladder, dysuria and vomiting. He endorses gross hematuria.   -04/10/20-patient with right ureteral calculus underwent ESL of 8 mm stone on 03/23/2020 and has had some retained fragments with persistent pain. Had been scheduled for cysto and right ureteroscopy early next week but has passed several stones in the interim. Has worked in today for evaluation for repeat KUB. The patient continues to have some mild pain but had severe pain earlier this morning prior to passing some fragments.  KUB is reviewed and shows persistent stone in the region of L4 on the right. He has passed some of the stone fragments from prior KUB on 04/06/2020. I reviewed the x-ray in detail with the patient.     ALLERGIES: No Allergies    MEDICATIONS: Ketorolac Tromethamine 10 mg tablet 1 tablet PO Q 8 H PRN For moderate pain  Ketorolac Tromethamine 10 mg tablet 1 tablet PO Q 8 H  Lipitor 40 mg tablet 1 tablet PO Daily  Tamsulosin Hcl 0.4 mg capsule 1 capsule PO Daily  Tamsulosin Hcl 0.4 mg capsule 1 capsule PO Daily  Zyrtec 10 mg tablet 1 tablet PO Daily  Aspirin Ec 81 mg tablet, delayed release  Gabapentin 300 mg capsule 1 capsule PO TID     GU PSH: ESWL, Right - 03/23/2020 Simple orchiectomy - 2008       PSH Notes: Surgery Testis, Gallbladder Surgery, Shoulder Surgery, Back Surgery, Appendectomy, Back Surgery, Orchiectomy Right, Appendectomy, Cholecystectomy   NON-GU PSH: Appendectomy - 2013, 2008 Cholecystectomy (open) - 2008     GU PMH: Ureteral calculus - 04/06/2020, - 04/01/2020 Renal calculus - 03/20/2020, - 03/17/2020, Nephrolithiasis, - 2017, Kidney stone on left side, - 0272 Renal colic - 5/36/6440 Gross hematuria - 03/17/2020, Gross hematuria, - 2014 Microscopic hematuria - 2019 Inflammatory Disease Prostate, Ot, Other prostatic inflammatory diseases - 2017 Abdominal Pain Unspec, Abdominal pain - 2014 Dorsalgia, Unspec, Backache - 2014 History of  urolithiasis, Nephrolithiasis - 2014 Pelvic/perineal pain, Male pelvic pain - 2014      PMH Notes:  2006-11-17 14:35:53 - Note: Blood In Urine   NON-GU PMH: Diverticulitis of intestine, part unspecified, without perforation or abscess without bleeding, Diverticulitis - 2017 Encounter for general adult medical examination without abnormal findings, Encounter for preventive health examination - 2017    FAMILY HISTORY: Blood In Urine - Father, [place] Status Number - Runs In Family nephrolithiasis - Mother, Brother, Grandfather Prostate Cancer - Father   SOCIAL HISTORY: Marital Status: Married     Notes: Current every day smoker, Tobacco Use, Caffeine Use, Alcohol Use, Marital History - Divorced, Occupation:, Marital History - Single   REVIEW OF SYSTEMS:    GU Review Male:   Patient reports get up at night to urinate. Patient denies frequent urination, hard to postpone urination, burning/ pain with urination, leakage of urine, stream starts and stops, trouble starting your stream, have to strain to urinate , erection problems, and penile pain.  Gastrointestinal (Upper):   Patient denies nausea, vomiting, and indigestion/ heartburn.  Gastrointestinal (Lower):   Patient denies diarrhea and constipation.  Constitutional:   Patient denies fever, night sweats, weight loss, and fatigue.  Skin:   Patient denies skin rash/ lesion and itching.  Eyes:   Patient denies blurred vision and double vision.  Ears/  Nose/ Throat:   Patient denies sore throat and sinus problems.  Hematologic/Lymphatic:   Patient denies swollen glands and easy bruising.  Cardiovascular:   Patient denies leg swelling and chest pains.  Respiratory:   Patient denies cough and shortness of breath.  Endocrine:   Patient denies excessive thirst.  Musculoskeletal:   Patient denies back pain and joint pain.  Neurological:   Patient denies headaches and dizziness.  Psychologic:   Patient denies depression and  anxiety.   VITAL SIGNS:      04/10/2020 01:10 PM  Weight 225 lb / 102.06 kg  Height 70 in / 177.8 cm  BP 107/70 mmHg  Pulse 73 /min  Temperature 98.6 F / 37 C  BMI 32.3 kg/m   PAST DATA REVIEW: None   PROCEDURES:         KUB - 25956  A single view of the abdomen is obtained.      Patient confirmed No Neulasta OnPro Device. KUB is reviewed today shows no obvious bony abnormality. There is approximate 7 mm fragment region of L4 on the right with a couple smaller fragments consistent with the stone having been treated with lithotripsy 2 weeks ago. No distal fragments seen.            Urinalysis w/Scope - 81001 Dipstick Dipstick Cont'd Micro  Color: Yellow Bilirubin: Neg mg/dL WBC/hpf: 6 - 10/hpf  Appearance: Clear Ketones: Neg mg/dL RBC/hpf: 0 - 2/hpf  Specific Gravity: 1.010 Blood: 2+ ery/uL Bacteria: Rare (0-9/hpf)  pH: 6.0 Protein: Neg mg/dL Cystals: NS (Not Seen)  Glucose: Neg mg/dL Urobilinogen: 0.2 mg/dL Casts: NS (Not Seen)    Nitrites: Neg Trichomonas: Not Present    Leukocyte Esterase: 2+ leu/uL Mucous: Not Present      Epithelial Cells: 0 - 5/hpf      Yeast: NS (Not Seen)      Sperm: Not Present    ASSESSMENT: None   PLAN:            Medications Stop Meds: Hydrocodone-Acetaminophen 5 mg-325 mg tablet 1-2 tablet PO Q 6 H PRN For severe pain Start: 03/17/2020  Discontinue: 04/10/2020  - Reason: The medication cycle was completed.            Orders X-Rays: KUB          Schedule         Document Letter(s):  Created for Patient: Clinical Summary         Notes:   Discussed KUB findings with the patient showing persistent stone fragments in the right ureter. Will keep scheduled time for cysto right ureteroscopy with laser lithotripsy and probable right JJ stent on 04/14/2020. Risks and benefits discussed as outlined below  I have recommended retrograde pyelogram, ureteroscopic stone manipulation with laser lithotripsy. I have discussed in detail the  risks, benefits and alternatives of ureteroscopic stone extraction to include but not limited to: Bleeding, infection, ureteral perforation with need for open repair, inability to place the stent necessitating the need for further procedures, possible percutaneous nephrostomy tube placement, discomfort from the stents, hematuria, urgency, frequency and refractory problems after the stent is removed. I discussed the stent is not a permanent stent and will require a followup for stent removal or stent exchange. The patient knows there is high risk for ureteral stent incrustation if this is not removed or exchanged within 3 months. Patient voices understanding of the risks and benefits of the procedure and consents to the procedure.

## 2020-04-14 ENCOUNTER — Encounter (HOSPITAL_BASED_OUTPATIENT_CLINIC_OR_DEPARTMENT_OTHER): Payer: Self-pay | Admitting: Urology

## 2020-04-14 ENCOUNTER — Ambulatory Visit (HOSPITAL_BASED_OUTPATIENT_CLINIC_OR_DEPARTMENT_OTHER)
Admission: RE | Admit: 2020-04-14 | Discharge: 2020-04-14 | Disposition: A | Discharge status: Home / Self Care | Payer: BC Managed Care – PPO | Attending: Urology | Admitting: Urology

## 2020-04-14 ENCOUNTER — Ambulatory Visit (HOSPITAL_BASED_OUTPATIENT_CLINIC_OR_DEPARTMENT_OTHER): Payer: BC Managed Care – PPO | Admitting: Anesthesiology

## 2020-04-14 ENCOUNTER — Encounter (HOSPITAL_BASED_OUTPATIENT_CLINIC_OR_DEPARTMENT_OTHER)
Admission: RE | Disposition: A | Discharge status: Home / Self Care | Payer: Self-pay | Source: Home / Self Care | Attending: Urology

## 2020-04-14 DIAGNOSIS — Z79899 Other long term (current) drug therapy: Secondary | ICD-10-CM | POA: Diagnosis not present

## 2020-04-14 DIAGNOSIS — Z9079 Acquired absence of other genital organ(s): Secondary | ICD-10-CM | POA: Diagnosis not present

## 2020-04-14 DIAGNOSIS — F17200 Nicotine dependence, unspecified, uncomplicated: Secondary | ICD-10-CM | POA: Diagnosis not present

## 2020-04-14 DIAGNOSIS — Z9049 Acquired absence of other specified parts of digestive tract: Secondary | ICD-10-CM | POA: Diagnosis not present

## 2020-04-14 DIAGNOSIS — N201 Calculus of ureter: Secondary | ICD-10-CM | POA: Insufficient documentation

## 2020-04-14 HISTORY — DX: Unspecified right bundle-branch block: I45.10

## 2020-04-14 HISTORY — PX: HOLMIUM LASER APPLICATION: SHX5852

## 2020-04-14 HISTORY — PX: CYSTOSCOPY WITH RETROGRADE PYELOGRAM, URETEROSCOPY AND STENT PLACEMENT: SHX5789

## 2020-04-14 HISTORY — DX: Atherosclerotic heart disease of native coronary artery without angina pectoris: I25.10

## 2020-04-14 HISTORY — DX: Presence of spectacles and contact lenses: Z97.3

## 2020-04-14 HISTORY — DX: Calculus of ureter: N20.1

## 2020-04-14 SURGERY — CYSTOURETEROSCOPY, WITH RETROGRADE PYELOGRAM AND STENT INSERTION
Anesthesia: General | Site: Urethra | Laterality: Right

## 2020-04-14 MED ORDER — ONDANSETRON HCL 4 MG/2ML IJ SOLN
INTRAMUSCULAR | Status: AC
  Filled 2020-04-14: qty 2

## 2020-04-14 MED ORDER — IOHEXOL 300 MG/ML  SOLN
INTRAMUSCULAR | Status: DC | PRN
  Administered 2020-04-14: 25 mL via URETHRAL

## 2020-04-14 MED ORDER — DEXAMETHASONE SODIUM PHOSPHATE 10 MG/ML IJ SOLN
INTRAMUSCULAR | Status: DC | PRN
  Administered 2020-04-14: 10 mg via INTRAVENOUS

## 2020-04-14 MED ORDER — LACTATED RINGERS IV SOLN: INTRAVENOUS | Status: DC

## 2020-04-14 MED ORDER — FENTANYL CITRATE (PF) 100 MCG/2ML IJ SOLN
INTRAMUSCULAR | Status: DC | PRN
  Administered 2020-04-14 (×6): 50 ug via INTRAVENOUS

## 2020-04-14 MED ORDER — FENTANYL CITRATE (PF) 100 MCG/2ML IJ SOLN
INTRAMUSCULAR | Status: AC
  Filled 2020-04-14: qty 2

## 2020-04-14 MED ORDER — LIDOCAINE 2% (20 MG/ML) 5 ML SYRINGE
INTRAMUSCULAR | Status: AC
  Filled 2020-04-14: qty 5

## 2020-04-14 MED ORDER — PROPOFOL 10 MG/ML IV BOLUS
INTRAVENOUS | Status: DC | PRN
  Administered 2020-04-14: 100 mg via INTRAVENOUS
  Administered 2020-04-14: 200 mg via INTRAVENOUS
  Administered 2020-04-14 (×2): 50 mg via INTRAVENOUS

## 2020-04-14 MED ORDER — ONDANSETRON HCL 4 MG/2ML IJ SOLN
INTRAMUSCULAR | Status: DC | PRN
  Administered 2020-04-14: 4 mg via INTRAVENOUS

## 2020-04-14 MED ORDER — KETOROLAC TROMETHAMINE 30 MG/ML IJ SOLN
INTRAMUSCULAR | Status: DC | PRN
  Administered 2020-04-14: 30 mg via INTRAVENOUS

## 2020-04-14 MED ORDER — KETOROLAC TROMETHAMINE 30 MG/ML IJ SOLN
INTRAMUSCULAR | Status: AC
  Filled 2020-04-14: qty 1

## 2020-04-14 MED ORDER — MIDAZOLAM HCL 2 MG/2ML IJ SOLN
INTRAMUSCULAR | Status: AC
  Filled 2020-04-14: qty 2

## 2020-04-14 MED ORDER — PHENYLEPHRINE HCL (PRESSORS) 10 MG/ML IV SOLN
INTRAVENOUS | Status: DC | PRN
  Administered 2020-04-14 (×4): 40 ug via INTRAVENOUS

## 2020-04-14 MED ORDER — LIDOCAINE HCL (CARDIAC) PF 100 MG/5ML IV SOSY
PREFILLED_SYRINGE | INTRAVENOUS | Status: DC | PRN
  Administered 2020-04-14: 100 mg via INTRAVENOUS

## 2020-04-14 MED ORDER — CEFAZOLIN SODIUM-DEXTROSE 2-4 GM/100ML-% IV SOLN
INTRAVENOUS | Status: AC
  Filled 2020-04-14: qty 100

## 2020-04-14 MED ORDER — CEFAZOLIN SODIUM-DEXTROSE 2-4 GM/100ML-% IV SOLN
2.0000 g | INTRAVENOUS | Status: AC
  Administered 2020-04-14: 2 g via INTRAVENOUS

## 2020-04-14 MED ORDER — MIDAZOLAM HCL 5 MG/5ML IJ SOLN
INTRAMUSCULAR | Status: DC | PRN
  Administered 2020-04-14 (×2): 2 mg via INTRAVENOUS

## 2020-04-14 MED ORDER — OXYCODONE-ACETAMINOPHEN 5-325 MG PO TABS: 1.0000 | ORAL_TABLET | ORAL | 0 refills | Status: DC | PRN

## 2020-04-14 MED ORDER — DEXAMETHASONE SODIUM PHOSPHATE 10 MG/ML IJ SOLN
INTRAMUSCULAR | Status: AC
  Filled 2020-04-14: qty 1

## 2020-04-14 MED ORDER — PROPOFOL 10 MG/ML IV BOLUS
INTRAVENOUS | Status: AC
  Filled 2020-04-14: qty 20

## 2020-04-14 SURGICAL SUPPLY — 26 items
BAG DRAIN URO-CYSTO SKYTR STRL (DRAIN) ×2 IMPLANT
BAG DRN UROCATH (DRAIN) ×1
BASKET STONE 1.7 NGAGE (UROLOGICAL SUPPLIES) ×1 IMPLANT
BULB IRRIG PATHFIND (MISCELLANEOUS) ×1 IMPLANT
CATH URET 5FR 28IN OPEN ENDED (CATHETERS) ×2 IMPLANT
CLOTH BEACON ORANGE TIMEOUT ST (SAFETY) ×2 IMPLANT
GLOVE SURG ENC MOIS LTX SZ7.5 (GLOVE) ×2 IMPLANT
GLOVE SURG UNDER POLY LF SZ6.5 (GLOVE) ×2 IMPLANT
GLOVE SURG UNDER POLY LF SZ7 (GLOVE) ×2 IMPLANT
GOWN STRL REUS W/TWL LRG LVL3 (GOWN DISPOSABLE) ×4 IMPLANT
GOWN STRL REUS W/TWL XL LVL3 (GOWN DISPOSABLE) ×2 IMPLANT
GUIDEWIRE STR DUAL SENSOR (WIRE) ×2 IMPLANT
KIT TURNOVER CYSTO (KITS) ×2 IMPLANT
MANIFOLD NEPTUNE II (INSTRUMENTS) ×2 IMPLANT
NS IRRIG 500ML POUR BTL (IV SOLUTION) ×2 IMPLANT
PACK CYSTO (CUSTOM PROCEDURE TRAY) ×2 IMPLANT
SHEATH URETERAL 12FRX28CM (UROLOGICAL SUPPLIES) ×1 IMPLANT
SHEATH URETERAL 12FRX35CM (MISCELLANEOUS) ×1 IMPLANT
STENT URET 6FRX26 CONTOUR (STENTS) ×1 IMPLANT
SYR 10ML LL (SYRINGE) ×2 IMPLANT
SYR 20ML LL LF (SYRINGE) ×2 IMPLANT
TRACTIP FLEXIVA PULS ID 200XHI (Laser) IMPLANT
TRACTIP FLEXIVA PULSE ID 200 (Laser) ×2
TUBE CONNECTING 12X1/4 (SUCTIONS) ×1 IMPLANT
TUBING UROLOGY SET (TUBING) ×2 IMPLANT
WATER STERILE IRR 3000ML UROMA (IV SOLUTION) ×2 IMPLANT

## 2020-04-14 NOTE — Transfer of Care (Signed)
Immediate Anesthesia Transfer of Care Note  Patient: Derrick Turner  Procedure(s) Performed: Procedure(s) (LRB): CYSTOSCOPY WITH RETROGRADE PYELOGRAM, URETEROSCOPY AND STENT PLACEMENT (Right) HOLMIUM LASER APPLICATION (Right)  Patient Location: PACU  Anesthesia Type: General  Level of Consciousness: awake, sedated, patient cooperative and responds to stimulation  Airway & Oxygen Therapy: Patient Spontanous Breathing and Patient connected to [place] 02 and soft FM   Post-op Assessment: Report given to PACU RN, Post -op Vital signs reviewed and stable and Patient moving all extremities  Post vital signs: Reviewed and stable  Complications: No apparent anesthesia complications

## 2020-04-14 NOTE — Anesthesia Procedure Notes (Signed)
Procedure Name: LMA Insertion Date/Time: 04/14/2020 11:36 AM Performed by: Justice Rocher, CRNA Pre-anesthesia Checklist: Patient identified, Emergency Drugs available, Suction available, Patient being monitored and Timeout performed Patient Re-evaluated:Patient Re-evaluated prior to induction Oxygen Delivery Method: Circle system utilized Preoxygenation: Pre-oxygenation with 100% oxygen Induction Type: IV induction Ventilation: Mask ventilation without difficulty LMA: LMA inserted LMA Size: 5.0 Number of attempts: 1 Airway Equipment and Method: Bite block Placement Confirmation: positive ETCO2,  CO2 detector and breath sounds checked- equal and bilateral Tube secured with: Tape Dental Injury: Teeth and Oropharynx as per pre-operative assessment

## 2020-04-14 NOTE — Interval H&P Note (Signed)
History and Physical Interval Note:  04/14/2020 10:08 AM  Derrick Turner  has presented today for surgery, with the diagnosis of RIGHT OBSTRUCTING URETERAL CALCULI.  The various methods of treatment have been discussed with the patient and family. After consideration of risks, benefits and other options for treatment, the patient has consented to  Procedure(s) with comments: CYSTOSCOPY WITH RETROGRADE PYELOGRAM, URETEROSCOPY AND STENT PLACEMENT (Right) - 1 HR HOLMIUM LASER APPLICATION (Right) as a surgical intervention.  The patient's history has been reviewed, patient examined, no change in status, stable for surgery.  I have reviewed the patient's chart and labs.  Questions were answered to the patient's satisfaction.     Remi Haggard

## 2020-04-14 NOTE — Op Note (Signed)
Preoperative diagnosis:  1. Right ureteral calculus  Postoperative diagnosis: 1. Right ureteral calculus  Procedure(s): 1. Cystoscopy, right retrograde pyelogram with intraoperative interpretation, right ureteroscopy and pyeloscopy with laser lithotripsy, stone extraction and insertion of right JJ stent  Surgeon: Dr. Harold Barban  Anesthesia: General  Complications: None  EBL: Minimal  Specimens: Kidney stone  Disposition of specimens: Stone fragments to go with the patient  Intraoperative findings: Approximate 9 mm right proximal ureteral calculus, stone migrated back to the kidney into the upper pole calyx, stone was fragmented into numerous small pieces, very hard stone. Fragments were extracted utilizing an engage basket and 6 [place] by 26 cm JJ stent placed  Indication: 50 year old white male with approximate 1 cm right UPJ stone initially underwent ESL and had fragments get into the right proximal ureter with failure to progress and continued pain. Presents now to undergo cystoscopy right ureteroscopy with laser lithotripsy and stone extraction and insertion of right JJ stent  Description of procedure:  After obtaining informed consent for the patient he was taken the major cystoscopy suite placed under general anesthesia. Placed in the dorsolithotomy position genitalia prepped and draped in usual sterile fashion. Proper pause and timeout was performed for site of procedure. 50 French cystoscope was advanced in the bladder without difficulty. Right ureteral orifice was identified. Retrograde pyelogram was performed and revealed proximal filling defect consistent with a stone seen on preoperative KUB. A sensor wire was passed up the right ureter by the level of the stone to the renal pelvis. I initially passed a 6.4 French semirigid ureteroscope over the guidewire and advanced up to the level of the stone, however with even mild irrigation the stone migrated proximally into the  kidney. Subsequently remove the semirigid ureteroscope. The 13-15 French long ureteral access sheath was then passed over the guidewire and advanced along its length with minimal resistance without difficulty. The digital flexible scope was advanced through the access sheath up to the kidney. Stone fragments migrated into upper pole calyx. Stone was subsequently fragmented with the 200 m fiber at a rate of 8 and 8. The stone was very hard and broke into small pieces but did not dust out very well. Utilizing the engage basket I subsequently distracted significant fragments on separate passes of the ureteroscope. Final look in the upper pole calyceal area revealed no significant fragments only dust particles. The remainder of the kidney was visualized and did not see any other stones. Retrograde pyelogram through the ureteroscope revealed good integrity of the collecting system without extravasation. A guidewire was passed through the flexible scope under direct vision and coiled in the upper pole calyx. The flexible scope was then removed as I backed out the access sheath visualizing the ureter along the way with no stones in the ureter. Guidewire was left in place. The wire was then back fed through the cystoscope and a 6 [place] by 26 cm Percuflex plus soft Contour stent was placed leaving a proximal coil in the renal pelvis and a distal coil in the bladder. Nylon suture was left on the distal end of the stent left protruding to the urethral meatus to facilitate future removal. Patient tolerated procedure was awakened from anesthesia taken back to the recovery room in stable condition. No immediate complication from the procedure.

## 2020-04-14 NOTE — Anesthesia Preprocedure Evaluation (Addendum)
Anesthesia Evaluation  Patient identified by MRN, date of birth, ID band Patient awake    Reviewed: Allergy & Precautions, NPO status , Patient's Chart, lab work & pertinent test results  History of Anesthesia Complications (+) MALIGNANT HYPERTHERMIA  Airway Mallampati: II  TM Distance: >3 FB Neck ROM: Full    Dental  (+) Dental Advisory Given   Pulmonary neg pulmonary ROS, Current Smoker and Patient abstained from smoking.,    Pulmonary exam normal breath sounds clear to auscultation       Cardiovascular + CAD  Normal cardiovascular exam+ dysrhythmias  Rhythm:Regular Rate:Normal  Echo 03/2016 - Left ventricle: The cavity size was moderately dilated. Systolic function was normal. The estimated ejection fraction was in the range of 55% to 60%. Wall motion was normal; there were no regional wall motion abnormalities. Left ventricular diastolic function parameters were normal.  - Aortic valve: Trileaflet; normal thickness, mildly calcified  leaflets.  - Mitral valve: There was trivial regurgitation.  - Pulmonary arteries: Systolic pressure could not be accurately estimated.    Neuro/Psych negative neurological ROS     GI/Hepatic Neg liver ROS, GERD  ,  Endo/Other  negative endocrine ROS  Renal/GU negative Renal ROS     Musculoskeletal negative musculoskeletal ROS (+)   Abdominal (+) + obese,   Peds  Hematology negative hematology ROS (+)   Anesthesia Other Findings   Reproductive/Obstetrics                           Anesthesia Physical Anesthesia Plan  ASA: III  Anesthesia Plan: General   Post-op Pain Management:    Induction: Intravenous  PONV Risk Score and Plan: 3 and Ondansetron, Dexamethasone, Midazolam and Treatment may vary due to age or medical condition  Airway Management Planned: LMA  Additional Equipment: None  Intra-op Plan:   Post-operative Plan: Extubation in  OR  Informed Consent: I have reviewed the patients History and Physical, chart, labs and discussed the procedure including the risks, benefits and alternatives for the proposed anesthesia with the patient or authorized representative who has indicated his/her understanding and acceptance.     Dental advisory given  Plan Discussed with: CRNA  Anesthesia Plan Comments:        Anesthesia Quick Evaluation

## 2020-04-14 NOTE — Discharge Instructions (Signed)
[facility] [phone] Post Ureteroscopy With or Without Stent Instructions  Definitions:  Ureter: The duct that transports urine from the kidney to the bladder. Stent:   A plastic hollow tube that is placed into the ureter, from the kidney to the bladder to prevent the ureter from swelling shut.  GENERAL INSTRUCTIONS:  Despite the fact that no skin incisions were used, the area around the ureter and bladder is raw and irritated. The stent is a foreign body which will further irritate the bladder wall. This irritation is manifested by increased frequency of urination, both day and night, and by an increase in the urge to urinate. In some, the urge to urinate is present almost always. Sometimes the urge is strong enough that you may not be able to stop yourself from urinating. The only real cure is to remove the stent and then give time for the bladder wall to heal which can't be done until the danger of the ureter swelling shut has passed, which varies.  You may see some blood in your urine while the stent is in place and a few days afterwards. Do not be alarmed, even if the urine was clear for a while. Get off your feet and drink lots of fluids until clearing occurs. If you start to pass clots or don't improve, call us.  DIET: You may return to your normal diet immediately. Because of the raw surface of your bladder, alcohol, spicy foods, acid type foods and drinks with caffeine may cause irritation or frequency and should be used in moderation. To keep your urine flowing freely and to avoid constipation, drink plenty of fluids during the day ( 8-10 glasses ). Tip: Avoid cranberry juice because it is very acidic.  ACTIVITY: Your physical activity doesn't need to be restricted. However, if you are very active, you may see some blood in your urine. We suggest that you reduce your activity under these circumstances until the bleeding has stopped.  BOWELS: It is important to  keep your bowels regular during the postoperative period. Straining with bowel movements can cause bleeding. A bowel movement every other day is reasonable. Use a mild laxative if needed, such as Milk of Magnesia 2-3 tablespoons, or 2 Dulcolax tablets. Call if you continue to have problems. If you have been taking narcotics for pain, before, during or after your surgery, you may be constipated. Take a laxative if necessary.   MEDICATION: You should resume your pre-surgery medications unless told not to. In addition you will often be given an antibiotic to prevent infection. These should be taken as prescribed until the bottles are finished unless you are having an unusual reaction to one of the drugs.  PROBLEMS YOU SHOULD REPORT TO [place]:  Fevers over 100.5 Fahrenheit.  Heavy bleeding, or clots ( See above notes about blood in urine ).  Inability to urinate.  Drug reactions ( hives, rash, nausea, vomiting, diarrhea ).  Severe burning or pain with urination that is not improving.  FOLLOW-UP: You will need a follow-up appointment to monitor your progress. Call for this appointment at the number listed above. Usually the first appointment will be about three to fourteen days after your surgery.  You may see a black string that looks like a piece of thread come out of the end of your penis. The string is attached to the stent and Dr. Milford Cage will use it to remove the stent in the office. If this happens, do not be concerned and do not  pull the string. You should continue to pee like normal.   Post Anesthesia Home Care Instructions  Activity: Get plenty of rest for the remainder of the day. A responsible individual must stay with you for 24 hours following the procedure.  For the next 24 hours, DO NOT: -Drive a car -Paediatric nurse -Drink alcoholic beverages -Take any medication unless instructed by your physician -Make any legal decisions or sign important papers.  Meals: Start with  liquid foods such as gelatin or soup. Progress to regular foods as tolerated. Avoid greasy, spicy, heavy foods. If nausea and/or vomiting occur, drink only clear liquids until the nausea and/or vomiting subsides. Call your physician if vomiting continues.  Special Instructions/Symptoms: Your throat may feel dry or sore from the anesthesia or the breathing tube placed in your throat during surgery. If this causes discomfort, gargle with warm salt water. The discomfort should disappear within 24 hours.    No ibuprofen, Advil, Aleve, Motrin, or naproxen until after 7:00 pm today if needed.

## 2020-04-15 ENCOUNTER — Encounter (HOSPITAL_BASED_OUTPATIENT_CLINIC_OR_DEPARTMENT_OTHER): Payer: Self-pay | Admitting: Urology

## 2020-04-15 NOTE — Anesthesia Postprocedure Evaluation (Signed)
Anesthesia Post Note  Patient: Derrick Turner  Procedure(s) Performed: CYSTOSCOPY WITH RETROGRADE PYELOGRAM, URETEROSCOPY AND STENT PLACEMENT (Right Urethra) HOLMIUM LASER APPLICATION (Right Urethra)     Patient location during evaluation: PACU Anesthesia Type: General Level of consciousness: sedated and patient cooperative Pain management: pain level controlled Vital Signs Assessment: post-procedure vital signs reviewed and stable Respiratory status: spontaneous breathing Cardiovascular status: stable Anesthetic complications: no   No complications documented.  Last Vitals:  Vitals:   04/14/20 1400 04/14/20 1410  BP: 137/75 132/88  Pulse: 89 85  Resp: 14 15  Temp:  36.5 C  SpO2: 94% 97%    Last Pain:  Vitals:   04/15/20 1014  TempSrc:   PainSc: 2                  Nolon Nations

## 2020-06-23 ENCOUNTER — Encounter: Payer: Self-pay | Admitting: Gastroenterology

## 2020-06-23 ENCOUNTER — Ambulatory Visit (INDEPENDENT_AMBULATORY_CARE_PROVIDER_SITE_OTHER): Payer: BC Managed Care – PPO | Admitting: Gastroenterology

## 2020-06-23 ENCOUNTER — Other Ambulatory Visit: Payer: Self-pay

## 2020-06-23 DIAGNOSIS — R933 Abnormal findings on diagnostic imaging of other parts of digestive tract: Secondary | ICD-10-CM

## 2020-06-23 DIAGNOSIS — K5732 Diverticulitis of large intestine without perforation or abscess without bleeding: Secondary | ICD-10-CM | POA: Diagnosis not present

## 2020-06-23 MED ORDER — ONDANSETRON HCL 4 MG PO TABS: 4.0000 mg | ORAL_TABLET | Freq: Four times a day (QID) | ORAL | 0 refills | Status: DC | PRN

## 2020-06-23 MED ORDER — AMOXICILLIN-POT CLAVULANATE 875-125 MG PO TABS: 1.0000 | ORAL_TABLET | Freq: Two times a day (BID) | ORAL | 0 refills | Status: AC

## 2020-06-23 NOTE — Progress Notes (Signed)
Primary Care Physician:  No primary care provider on file. Referred by [facility]: Daine Gravel, NP Primary Gastroenterologist:  Elon Alas. Abbey Chatters, DO   Chief Complaint  Patient presents with  . Diverticulitis    CT done 06/18/20. Right side abdominal pain. Had nausea/vomiting last night. Hasn't been treated this time. Last diverticulitis episode 3-4 years ago. Had bad episode approx 10 years ago. Never had TCS    HPI:  Derrick Turner is a 50 y.o. male here at the request of Daine Gravel, NP with [facility] for further evaluation of diverticulitis.   Patient has had very difficult year. Started off year with Covid, mild symptoms. Has lost both parents. Has been dealing with right sided kidney stones requiring several urological procedures over the last couple of months. States his pain has improved, right flank pain resolved, but still has some right mid abdominal pain.  Chronically has urinary issues, back pain.  Continues to have microscopic hematuria.  Urologist ordered CT as outlined below.  Patient states he has had diverticulitis in the past.  Severe episode about 8 to 10 years ago, eventually hospitalized.  Last episode 3 to 4 years ago.  No prior colonoscopy.  No antibiotics provided so far.  Outside of right mid abdominal pain really did not appreciate any left-sided abdominal pain or symptoms suggestive of diverticulitis.  States he is never really been pain-free even after adequate treatment of his kidney stones.  Approximately a week ago however his stools did change, more loose.  Typically has daily solid bowel movement.  He denies any melena or rectal bleeding.  No fever.  Last night he did develop some vomiting.  Has tolerated clear liquids this morning.  Denies heartburn.  States he is lost 20 pounds over the past several months, reporting weighing 235 pounds before he got sick in January.  According to weights in Epic, his weight has stabilized.  CT abdomen pelvis  without and with contrast 06/18/2020: 4-5 calculi in the lower pole right kidney largest 5 mm.  No hydronephrosis.  Sigmoid diverticular disease with mild surrounding stranding suggested and long segment thickening of the colon.  Potential mild acute diverticulitis with surrounding stranding adjacent to diverticulum.  Status post appendectomy, cholecystectomy, right orchiectomy.    Wt Readings from Last 3 Encounters:  06/23/20 223 lb 3.2 oz (101.2 kg)  04/14/20 222 lb (100.7 kg)  03/23/20 218 lb (98.9 kg)       Current Outpatient Medications  Medication Sig Dispense Refill  . aspirin 81 MG EC tablet TAKE 1 TABLET (81 MG TOTAL) BY MOUTH DAILY. 90 tablet 3  . atorvastatin (LIPITOR) 40 MG tablet Take 1 tablet (40 mg total) by mouth daily. 90 tablet 3  . cetirizine (ZYRTEC) 10 MG tablet Take 10 mg by mouth daily.    Marland Kitchen docusate sodium (COLACE) 100 MG capsule Take 100 mg by mouth daily.    Marland Kitchen gabapentin (NEURONTIN) 300 MG capsule Take 600 mg by mouth at bedtime.  1  . ibuprofen (ADVIL) 200 MG tablet Take 800 mg by mouth 2 (two) times daily.    . nitroGLYCERIN (NITROSTAT) 0.4 MG SL tablet Place 1 tablet (0.4 mg total) under the tongue every 5 (five) minutes x 3 doses as needed for chest pain. 30 tablet 12  . tamsulosin (FLOMAX) 0.4 MG CAPS capsule Take 1 capsule (0.4 mg total) by mouth daily. 14 capsule 1   No current facility-administered medications for this visit.    Allergies as of 06/23/2020  . (  No Known Allergies)    Past Medical History:  Diagnosis Date  . Coronary artery disease cardiologist--- dr croitoru   ETT 04-01-2016 in epic, reproduced chest pain but no EKG changes;  cardiac cath 04-06-2016 moderate diffuse nonobstructive disease, aggressive medical management  . History of 2019 novel coronavirus disease (COVID-19) 03/02/2020   per pt tested positive covid, copy of result in epic, stated mild symptoms that resolved  . History of kidney stones   . Incomplete right bundle  branch block   . Right ureteral calculus   . Wears glasses     Past Surgical History:  Procedure Laterality Date  . APPENDECTOMY  teen  . BACK SURGERY     two  . CYSTOSCOPY WITH RETROGRADE PYELOGRAM, URETEROSCOPY AND STENT PLACEMENT Right 04/14/2020   Procedure: CYSTOSCOPY WITH RETROGRADE PYELOGRAM, URETEROSCOPY AND STENT PLACEMENT;  Surgeon: Remi Haggard, MD;  Location: [facility];  Service: Urology;  Laterality: Right;  . EXTRACORPOREAL SHOCK WAVE LITHOTRIPSY Right 03/23/2020   Procedure: EXTRACORPOREAL SHOCK WAVE LITHOTRIPSY (ESWL);  Surgeon: Lucas Mallow, MD;  Location: [facility];  Service: Urology;  Laterality: Right;  . HOLMIUM LASER APPLICATION Right 08/12/6431   Procedure: HOLMIUM LASER APPLICATION;  Surgeon: Remi Haggard, MD;  Location: [facility];  Service: Urology;  Laterality: Right;  . LAPAROSCOPIC CHOLECYSTECTOMY  2012 approx  . LEFT HEART CATH AND CORONARY ANGIOGRAPHY N/A 04/06/2016   Procedure: Left Heart Cath and Coronary Angiography;  Surgeon: Sherren Mocha, MD;  Location: [facility];  Service: Cardiovascular;  Laterality: N/A;  . ORCHIECTOMY Right 1990s   per pt benign cyst  . SHOULDER SURGERY Bilateral left 2019;  right 2016    Family History  Problem Relation Age of Onset  . Heart attack Father   . Heart attack Maternal Grandmother   . Heart attack Maternal Grandfather   . Colon cancer Neg Hx   . Colon polyps Neg Hx     Social History   Socioeconomic History  . Marital status: Married    Spouse name: Not on file  . Number of children: Not on file  . Years of education: Not on file  . Highest education level: Not on file  Occupational History  . Not on file  Tobacco Use  . Smoking status: Current Every Day Smoker    Packs/day: 1.50    Years: 35.00    Pack years: 52.50    Types: Cigarettes  . Smokeless tobacco: Never Used  Vaping Use  . Vaping Use: Never used  Substance and  Sexual Activity  . Alcohol use: Yes    Comment: occasional  . Drug use: Never  . Sexual activity: Not on file  Other Topics Concern  . Not on file  Social History Narrative  . Not on file   Social Determinants of Health   Financial Resource Strain: Not on file  Food Insecurity: Not on file  Transportation Needs: Not on file  Physical Activity: Not on file  Stress: Not on file  Social Connections: Not on file  Intimate Partner Violence: Not on file      ROS:  General: Negative for anorexia, fever, chills, fatigue, weakness. See hpi Eyes: Negative for vision changes.  ENT: Negative for hoarseness, difficulty swallowing , nasal congestion. CV: Negative for chest pain, angina, palpitations, dyspnea on exertion, peripheral edema.  Respiratory: Negative for dyspnea at rest, dyspnea on exertion, cough, sputum, wheezing.  GI: See history of present illness. GU:  Negative for  urinary incontinence, urinary frequency, nocturnal urination. See hpi MS: Negative for joint pain, low back pain.  Derm: Negative for rash or itching.  Neuro: Negative for weakness, abnormal sensation, seizure, frequent headaches, memory loss, confusion.  Psych: Negative for anxiety, depression, suicidal ideation, hallucinations.  Endo: Negative for unusual weight change. See hpi Heme: Negative for bruising or bleeding. Allergy: Negative for rash or hives.    Physical Examination:  BP 125/78   Pulse 67   Temp 97.7 F (36.5 C) (Temporal)   Ht 5\' 10"  (1.778 m)   Wt 223 lb 3.2 oz (101.2 kg)   BMI 32.03 kg/m    General: Well-nourished, well-developed in no acute distress.  Head: Normocephalic, atraumatic.   Eyes: Conjunctiva pink, no icterus. Mouth: masked Neck: Supple without thyromegaly, masses, or lymphadenopathy.  Lungs: Clear to auscultation bilaterally.  Heart: Regular rate and rhythm, no murmurs rubs or gallops.  Abdomen: Bowel sounds are normal,  nondistended, no hepatosplenomegaly or  masses, no abdominal bruits or    hernia , no rebound or guarding.  Mild right mid abdominal tenderness, mild suprapubic tenderness Rectal: not performed Extremities: No lower extremity edema. No clubbing or deformities.  Neuro: Alert and oriented x 4 , grossly normal neurologically.  Skin: Warm and dry, no rash or jaundice.   Psych: Alert and cooperative, normal mood and affect.     Imaging Studies: See hpi   Assessment: Pleasant 50 year old male with history of kidney stones, coronary artery disease, diverticulitis presenting for further evaluation of recent abnormal colon on CT.  Patient has had some weight loss over the past few months in the setting of having COVID, losing both parents, issues with kidney stones. Weight loss likely multifactorial and currently stabilized.  Recent CT to follow-up on microscopic hematuria, kidney stones showed sigmoid diverticular disease with mild surrounding stranding suggested and long segment thickening of the colon.  Potential mild acute diverticulitis with surrounding stranding adjacent to diverticulum.  He has never had a colonoscopy.  Suspect he does have mild uncomplicated sigmoid diverticulitis as explanation for recent change in stools, suprapubic tenderness but right mid abdominal pain etiology unclear.  Long segment thickening of the colon in the sigmoid region could be secondary to recurrent diverticulitis but underlying malignancy needs to be excluded.  Plan: 1. Augmentin 875/125 mg twice daily for 10 days. Call if worsening symptoms. Call at completion of antibiotics and given progress report.  2. Zofran for nausea.  3. Soft diet for the next 48 hours, low fiber diet thereafter until he completes antibiotics.  At that point slowly add back high fiber foods. 4. Would recommend colonoscopy 4 weeks after completing the antibiotics.  He will call before scheduling, to verify that he is doing better.

## 2020-06-23 NOTE — Patient Instructions (Signed)
1. Start Augmentin one tablet twice daily with food for 10 days. 2. For 48 hours, eat a soft diet. Hold off on high fiber foods until antibiotics completed, then slowly add back to your diet.  3. We will plan for a colonoscopy about 4 weeks after you complete your antibiotics. 4. Please call sooner if you are not improving. Otherwise call with a progress report after you complete your antibiotics.

## 2020-07-10 ENCOUNTER — Telehealth: Payer: Self-pay | Admitting: Gastroenterology

## 2020-07-10 NOTE — Telephone Encounter (Signed)
Please call patient. If his abdominal pain is significantly improved, then we can move forward with scheduling a colonoscopy with Dr. Abbey Chatters. ASA III for abnormal sigmoid colon on CT.

## 2020-07-14 ENCOUNTER — Other Ambulatory Visit: Payer: Self-pay | Admitting: Internal Medicine

## 2020-07-14 ENCOUNTER — Other Ambulatory Visit: Payer: Self-pay

## 2020-07-14 MED ORDER — CLENPIQ 10-3.5-12 MG-GM -GM/160ML PO SOLN: 1.0000 | Freq: Once | ORAL | 0 refills | Status: AC

## 2020-07-14 NOTE — Telephone Encounter (Signed)
Called pt, TCS w/Propofol ASA 3 w/Dr. Abbey Chatters scheduled for 08/11/20 at 7:30am. Rx for prep sent to pharmacy. Orders entered. Mailing Clenpiq coupon with instructions.

## 2020-07-14 NOTE — Telephone Encounter (Signed)
Phoned and spoke with the pt and he has improved and is ready to have his colonoscopy scheduled with Dr. Abbey Chatters

## 2020-07-14 NOTE — Telephone Encounter (Signed)
[facility] clinical pool: Please see Rx change request.  Clenpiq is not covered by insurance.

## 2020-07-15 ENCOUNTER — Other Ambulatory Visit: Payer: Self-pay

## 2020-07-15 MED ORDER — PEG 3350-KCL-NA BICARB-NACL 420 G PO SOLR: 4000.0000 mL | ORAL | 0 refills | Status: DC

## 2020-07-15 NOTE — Telephone Encounter (Signed)
Pre-op 08/06/20. Appt letter mailed with Tri-Lyte instructions (see refill note, Clenpiq not covered).

## 2020-07-15 NOTE — Telephone Encounter (Signed)
Rx for Tri-Lyte sent to pharmacy. Will mail instructions after pre-op appt is scheduled.

## 2020-08-06 ENCOUNTER — Encounter (HOSPITAL_COMMUNITY): Payer: Self-pay

## 2020-08-06 ENCOUNTER — Encounter (HOSPITAL_COMMUNITY)
Admission: RE | Admit: 2020-08-06 | Discharge: 2020-08-06 | Disposition: A | Discharge status: Home / Self Care | Payer: BC Managed Care – PPO | Source: Ambulatory Visit | Attending: Internal Medicine | Admitting: Internal Medicine

## 2020-08-06 ENCOUNTER — Other Ambulatory Visit: Payer: Self-pay

## 2020-08-06 NOTE — Patient Instructions (Signed)
Derrick Turner  08/06/2020     @PREFPERIOPPHARMACY @   Your procedure is scheduled on 08/11/20.  Report to Derrick Turner at [phone] A.M.  Call this number if you have problems the morning of surgery:  [phone]   Remember:  Do not eat or drink after midnight.  Follow diet & prep instructions that were provided to you from the office.               Take these medicines the morning of surgery with A SIP OF WATER zyrtec, zofran, prednisone, flomax & zanaflex if needed.    Do not wear jewelry, make-up or nail polish.  Do not wear lotions, powders, or perfumes, or deodorant.  Do not shave 48 hours prior to surgery.  Men may shave face and neck.  Do not bring valuables to the hospital.  [facility] is not responsible for any belongings or valuables.  Contacts, dentures or bridgework may not be worn into surgery.  Leave your suitcase in the car.  After surgery it may be brought to your room.  For patients admitted to the hospital, discharge time will be determined by your treatment team.  Patients discharged the day of surgery will not be allowed to drive home.   Special instructions:  follow your prep instructions & diet from the office  Please read over the following fact sheets that you were given. Anesthesia Post-op Instructions and Care and Recovery After Surgery      Monitored Anesthesia Care, Care After This sheet gives you information about how to care for yourself after your procedure. Your health care provider may also give you more specific instructions. If you have problems or questions, contact your health careprovider. What can I expect after the procedure? After the procedure, it is common to have: Tiredness. Forgetfulness about what happened after the procedure. Impaired judgment for important decisions. Nausea or vomiting. Some difficulty with balance. Follow these instructions at home: For the time period you were told by your health care provider:      Rest as needed. Do not participate in activities where you could fall or become injured. Do not drive or use machinery. Do not drink alcohol. Do not take sleeping pills or medicines that cause drowsiness. Do not make important decisions or sign legal documents. Do not take care of children on your own. Eating and drinking Follow the diet that is recommended by your health care provider. Drink enough fluid to keep your urine pale yellow. If you vomit: Drink water, juice, or soup when you can drink without vomiting. Make sure you have little or no nausea before eating solid foods. General instructions Have a responsible adult stay with you for the time you are told. It is important to have someone help care for you until you are awake and alert. Take over-the-counter and prescription medicines only as told by your health care provider. If you have sleep apnea, surgery and certain medicines can increase your risk for breathing problems. Follow instructions from your health care provider about wearing your sleep device: Anytime you are sleeping, including during daytime naps. While taking prescription pain medicines, sleeping medicines, or medicines that make you drowsy. Avoid smoking. Keep all follow-up visits as told by your health care provider. This is important. Contact a health care provider if: You keep feeling nauseous or you keep vomiting. You feel light-headed. You are still sleepy or having trouble with balance after 24 hours. You develop a rash. You have a fever.  You have redness or swelling around the IV site. Get help right away if: You have trouble breathing. You have new-onset confusion at home. Summary For several hours after your procedure, you may feel tired. You may also be forgetful and have poor judgment. Have a responsible adult stay with you for the time you are told. It is important to have someone help care for you until you are awake and alert. Rest as  told. Do not drive or operate machinery. Do not drink alcohol or take sleeping pills. Get help right away if you have trouble breathing, or if you suddenly become confused. This information is not intended to replace advice given to you by your health care provider. Make sure you discuss any questions you have with your healthcare provider. Document Revised: 10/10/2019 Document Reviewed: 12/27/2018 Elsevier Patient Education  2022 [place]. Colonoscopy, Adult A colonoscopy is a procedure to look at the entire large intestine. This procedure is done using a long, thin, flexible tube that has a camera on theend. You may have a colonoscopy: As a part of normal colorectal screening. If you have certain symptoms, such as: A low number of red blood cells in your blood (anemia). Diarrhea that does not go away. Pain in your abdomen. Blood in your stool. A colonoscopy can help screen for and diagnose medical problems, including: Tumors. Extra tissue that grows where mucus forms (polyps). Inflammation. Areas of bleeding. Tell your health care provider about: Any allergies you have. All medicines you are taking, including vitamins, herbs, eye drops, creams, and over-the-counter medicines. Any problems you or family members have had with anesthetic medicines. Any blood disorders you have. Any surgeries you have had. Any medical conditions you have. Any problems you have had with having bowel movements. Whether you are pregnant or may be pregnant. What are the risks? Generally, this is a safe procedure. However, problems may occur, including: Bleeding. Damage to your intestine. Allergic reactions to medicines given during the procedure. Infection. This is rare. What happens before the procedure? Eating and drinking restrictions Follow instructions from your health care provider about eating or drinking restrictions, which may include: A few days before the procedure: Follow a low-fiber  diet. Avoid nuts, seeds, dried fruit, raw fruits, and vegetables. 1-3 days before the procedure: Eat only gelatin dessert or ice pops. Drink only clear liquids, such as water, clear juice, clear broth or bouillon, black coffee or tea, or clear soft drinks or sports drinks. Avoid liquids that contain red or purple dye. The day of the procedure: Do not eat solid foods. You may continue to drink clear liquids until up to 2 hours before the procedure. Do not eat or drink anything starting 2 hours before the procedure, or within the time period that your health care provider recommends. Bowel prep If you were prescribed a bowel prep to take by mouth (orally) to clean out your colon: Take it as told by your health care provider. Starting the day before your procedure, you will need to drink a large amount of liquid medicine. The liquid will cause you to have many bowel movements of loose stool until your stool becomes almost clear or light green. If your skin or the opening between the buttocks (anus) gets irritated from diarrhea, you may relieve the irritation using: Wipes with medicine in them, such as adult wet wipes with aloe and vitamin E. A product to soothe skin, such as petroleum jelly. If you vomit while drinking the bowel prep: Take  a break for up to 60 minutes. Begin the bowel prep again. Call your health care provider if you keep vomiting or you cannot take the bowel prep without vomiting. To clean out your colon, you may also be given: Laxative medicines. These help you have a bowel movement. Instructions for enema use. An enema is liquid medicine injected into your rectum. Medicines Ask your health care provider about: Changing or stopping your regular medicines or supplements. This is especially important if you are taking iron supplements, diabetes medicines, or blood thinners. Taking medicines such as aspirin and ibuprofen. These medicines can thin your blood. Do not take these  medicines unless your health care provider tells you to take them. Taking over-the-counter medicines, vitamins, herbs, and supplements. General instructions Ask your health care provider what steps will be taken to help prevent infection. These may include washing skin with a germ-killing soap. Plan to have someone take you home from the hospital or clinic. What happens during the procedure?  An IV will be inserted into one of your veins. You may be given one or more of the following: A medicine to help you relax (sedative). A medicine to numb the area (local anesthetic). A medicine to make you fall asleep (general anesthetic). This is rarely needed. You will lie on your side with your knees bent. The tube will: Have oil or gel put on it (be lubricated). Be inserted into your anus. Be gently eased through all parts of your large intestine. Air will be sent into your colon to keep it open. This may cause some pressure or cramping. Images will be taken with the camera and will appear on a screen. A small tissue sample may be removed to be looked at under a microscope (biopsy). The tissue may be sent to a lab for testing if any signs of problems are found. If small polyps are found, they may be removed and checked for cancer cells. When the procedure is finished, the tube will be removed. The procedure may vary among health care providers and hospitals. What happens after the procedure? Your blood pressure, heart rate, breathing rate, and blood oxygen level will be monitored until you leave the hospital or clinic. You may have a small amount of blood in your stool. You may pass gas and have mild cramping or bloating in your abdomen. This is caused by the air that was used to open your colon during the exam. Do not drive for 24 hours after the procedure. It is up to you to get the results of your procedure. Ask your health care provider, or the department that is doing the procedure, when  your results will be ready. Summary A colonoscopy is a procedure to look at the entire large intestine. Follow instructions from your health care provider about eating and drinking before the procedure. If you were prescribed an oral bowel prep to clean out your colon, take it as told by your health care provider. During the colonoscopy, a flexible tube with a camera on its end is inserted into the anus and then passed into the other parts of the large intestine. This information is not intended to replace advice given to you by your health care provider. Make sure you discuss any questions you have with your healthcare provider. Document Revised: 08/17/2018 Document Reviewed: 08/17/2018 Elsevier Patient Education  [place].

## 2020-08-08 ENCOUNTER — Other Ambulatory Visit: Payer: Self-pay | Admitting: Physician Assistant

## 2020-08-08 DIAGNOSIS — E785 Hyperlipidemia, unspecified: Secondary | ICD-10-CM

## 2020-08-11 ENCOUNTER — Ambulatory Visit (HOSPITAL_COMMUNITY): Payer: BC Managed Care – PPO | Admitting: Anesthesiology

## 2020-08-11 ENCOUNTER — Ambulatory Visit (HOSPITAL_COMMUNITY)
Admission: RE | Admit: 2020-08-11 | Discharge: 2020-08-11 | Disposition: A | Discharge status: Home / Self Care | Payer: BC Managed Care – PPO | Attending: Internal Medicine | Admitting: Internal Medicine

## 2020-08-11 ENCOUNTER — Encounter (HOSPITAL_COMMUNITY)
Admission: RE | Disposition: A | Discharge status: Home / Self Care | Payer: Self-pay | Source: Home / Self Care | Attending: Internal Medicine

## 2020-08-11 ENCOUNTER — Encounter (HOSPITAL_COMMUNITY): Payer: Self-pay

## 2020-08-11 ENCOUNTER — Other Ambulatory Visit: Payer: Self-pay

## 2020-08-11 DIAGNOSIS — Z791 Long term (current) use of non-steroidal anti-inflammatories (NSAID): Secondary | ICD-10-CM | POA: Insufficient documentation

## 2020-08-11 DIAGNOSIS — R933 Abnormal findings on diagnostic imaging of other parts of digestive tract: Secondary | ICD-10-CM | POA: Diagnosis present

## 2020-08-11 DIAGNOSIS — K648 Other hemorrhoids: Secondary | ICD-10-CM | POA: Diagnosis not present

## 2020-08-11 DIAGNOSIS — Z8616 Personal history of COVID-19: Secondary | ICD-10-CM | POA: Insufficient documentation

## 2020-08-11 DIAGNOSIS — Z8249 Family history of ischemic heart disease and other diseases of the circulatory system: Secondary | ICD-10-CM | POA: Diagnosis not present

## 2020-08-11 DIAGNOSIS — K529 Noninfective gastroenteritis and colitis, unspecified: Secondary | ICD-10-CM

## 2020-08-11 DIAGNOSIS — K5730 Diverticulosis of large intestine without perforation or abscess without bleeding: Secondary | ICD-10-CM | POA: Insufficient documentation

## 2020-08-11 DIAGNOSIS — Z7982 Long term (current) use of aspirin: Secondary | ICD-10-CM | POA: Insufficient documentation

## 2020-08-11 DIAGNOSIS — Z7952 Long term (current) use of systemic steroids: Secondary | ICD-10-CM | POA: Diagnosis not present

## 2020-08-11 DIAGNOSIS — D120 Benign neoplasm of cecum: Secondary | ICD-10-CM | POA: Insufficient documentation

## 2020-08-11 DIAGNOSIS — D125 Benign neoplasm of sigmoid colon: Secondary | ICD-10-CM | POA: Insufficient documentation

## 2020-08-11 DIAGNOSIS — Z79899 Other long term (current) drug therapy: Secondary | ICD-10-CM | POA: Insufficient documentation

## 2020-08-11 DIAGNOSIS — D124 Benign neoplasm of descending colon: Secondary | ICD-10-CM | POA: Insufficient documentation

## 2020-08-11 DIAGNOSIS — K635 Polyp of colon: Secondary | ICD-10-CM

## 2020-08-11 DIAGNOSIS — F17210 Nicotine dependence, cigarettes, uncomplicated: Secondary | ICD-10-CM | POA: Insufficient documentation

## 2020-08-11 DIAGNOSIS — K6389 Other specified diseases of intestine: Secondary | ICD-10-CM

## 2020-08-11 HISTORY — PX: POLYPECTOMY: SHX5525

## 2020-08-11 HISTORY — PX: BIOPSY: SHX5522

## 2020-08-11 HISTORY — PX: COLONOSCOPY WITH PROPOFOL: SHX5780

## 2020-08-11 SURGERY — COLONOSCOPY WITH PROPOFOL
Anesthesia: General

## 2020-08-11 MED ORDER — PROPOFOL 10 MG/ML IV BOLUS
INTRAVENOUS | Status: DC | PRN
  Administered 2020-08-11: 100 mg via INTRAVENOUS
  Administered 2020-08-11: 60 mg via INTRAVENOUS

## 2020-08-11 MED ORDER — STERILE WATER FOR IRRIGATION IR SOLN
Status: DC | PRN
  Administered 2020-08-11: 1.5 mL

## 2020-08-11 MED ORDER — PROPOFOL 500 MG/50ML IV EMUL
INTRAVENOUS | Status: DC | PRN
  Administered 2020-08-11: 150 ug/kg/min via INTRAVENOUS

## 2020-08-11 MED ORDER — LACTATED RINGERS IV SOLN: INTRAVENOUS | Status: DC

## 2020-08-11 NOTE — Discharge Instructions (Signed)
  Colonoscopy Discharge Instructions  Read the instructions outlined below and refer to this sheet in the next few weeks. These discharge instructions provide you with general information on caring for yourself after you leave the hospital. Your doctor may also give you specific instructions. While your treatment has been planned according to the most current medical practices available, unavoidable complications occasionally occur.   ACTIVITY You may resume your regular activity, but move at a slower pace for the next 24 hours.  Take frequent rest periods for the next 24 hours.  Walking will help get rid of the air and reduce the bloated feeling in your belly (abdomen).  No driving for 24 hours (because of the medicine (anesthesia) used during the test).   Do not sign any important legal documents or operate any machinery for 24 hours (because of the anesthesia used during the test).  NUTRITION Drink plenty of fluids.  You may resume your normal diet as instructed by your doctor.  Begin with a light meal and progress to your normal diet. Heavy or fried foods are harder to digest and may make you feel sick to your stomach (nauseated).  Avoid alcoholic beverages for 24 hours or as instructed.  MEDICATIONS You may resume your normal medications unless your doctor tells you otherwise.  WHAT YOU CAN EXPECT TODAY Some feelings of bloating in the abdomen.  Passage of more gas than usual.  Spotting of blood in your stool or on the toilet paper.  IF YOU HAD POLYPS REMOVED DURING THE COLONOSCOPY: No aspirin products for 7 days or as instructed.  No alcohol for 7 days or as instructed.  Eat a soft diet for the next 24 hours.  FINDING OUT THE RESULTS OF YOUR TEST Not all test results are available during your visit. If your test results are not back during the visit, make an appointment with your caregiver to find out the results. Do not assume everything is normal if you have not heard from your  caregiver or the medical facility. It is important for you to follow up on all of your test results.  SEEK IMMEDIATE MEDICAL ATTENTION IF: You have more than a spotting of blood in your stool.  Your belly is swollen (abdominal distention).  You are nauseated or vomiting.  You have a temperature over 101.  You have abdominal pain or discomfort that is severe or gets worse throughout the day.   Your colonoscopy revealed 2 polyp(s) which I removed successfully. Await pathology results, my office will contact you. You also have diverticulosis and internal hemorrhoids.  Some inflammation in your sigmoid colon which could be resolving diverticulitis.  Also took biopsies of this.  I would recommend increasing fiber in your diet or adding OTC Benefiber/Metamucil. Be sure to drink at least 4 to 6 glasses of water daily.   There was an area on the right side your colon that looked abnormal.  Possible this is a large polyp versus inflammation/congestion.  I took numerous biopsies of this.  Await pathology results.  Next steps will be determined after these are reviewed.   I hope you have a great rest of your week!  Elon Alas. Abbey Chatters, D.O. Gastroenterology and Hepatology [facility]

## 2020-08-11 NOTE — Op Note (Signed)
[facility] Patient Name: Derrick Turner Procedure Date: 08/11/2020 6:36 AM MRN: [id number] Date of Birth: Jun 20, 1970 Attending MD: Elon Alas. Abbey Chatters DO CSN: [id number] Age: 50 Admit Type: Outpatient Procedure:                Colonoscopy Indications:              Abnormal CT of the GI tract, Follow-up of                            diverticulitis Providers:                Elon Alas. Abbey Chatters, DO, Charlsie Quest. Theda Sers RN, RN,                            Aram Candela Referring MD:              Medicines:                See the Anesthesia note for documentation of the                            administered medications Complications:            No immediate complications. Estimated Blood Loss:     Estimated blood loss was minimal. Procedure:                Pre-Anesthesia Assessment:                           - The anesthesia plan was to use monitored                            anesthesia care (MAC).                           After obtaining informed consent, the colonoscope                            was passed under direct vision. Throughout the                            procedure, the patient's blood pressure, pulse, and                            oxygen saturations were monitored continuously. The                            PCF-HQ190L ([id number]) scope was introduced through                            the anus and advanced to the the terminal ileum,                            with identification of the appendiceal orifice and                            IC valve. The colonoscopy was performed without  difficulty. The patient tolerated the procedure                            well. The quality of the bowel preparation was                            evaluated using the BBPS [facility] Bowel Preparation                            Scale) with scores of: Right Colon = 3, Transverse                            Colon = 3 and Left Colon = 3 (entire mucosa seen                             well with no residual staining, small fragments of                            stool or opaque liquid). The total BBPS score                            equals 9. Scope In: 7:44:37 AM Scope Out: 8:00:36 AM Scope Withdrawal Time: 0 hours 12 minutes 54 seconds  Total Procedure Duration: 0 hours 15 minutes 59 seconds  Findings:      The perianal and digital rectal examinations were normal.      Non-bleeding internal hemorrhoids were found during endoscopy.      Multiple small and large-mouthed diverticula were found in the sigmoid       colon.      Localized mild inflammation characterized by erythema was found in the       sigmoid colon. Biopsies were taken with a cold forceps for histology.      Two semi-pedunculated polyps were found in the sigmoid colon and       descending colon. The polyps were 4 to 7 mm in size. These polyps were       removed with a cold snare. Resection and retrieval were complete.      A localized area of abnormal mucosa was found at the ileocecal valve.       Appeared polypoid and inflamed, soft. Possible large polyp/mass vs       congestion/inflammation. Biopsies were taken with a cold forceps for       histology.      The terminal ileum appeared normal. Impression:               - Non-bleeding internal hemorrhoids.                           - Diverticulosis in the sigmoid colon.                           - Localized mild inflammation was found in the                            sigmoid colon. Biopsied.                           -  Two 4 to 7 mm polyps in the sigmoid colon and in                            the descending colon, removed with a cold snare.                            Resected and retrieved.                           - Abnormal mucosa at the ileocecal valve. Biopsied.                           - The examined portion of the ileum was normal. Moderate Sedation:      Per Anesthesia Care Recommendation:           - Patient has a contact  number available for                            emergencies. The signs and symptoms of potential                            delayed complications were discussed with the                            patient. Return to normal activities tomorrow.                            Written discharge instructions were provided to the                            patient.                           - Resume previous diet.                           - Continue present medications.                           - Await pathology results.                           - Repeat colonoscopy date to be determined after                            pending pathology results are reviewed for                            surveillance based on pathology results.                           - May need referral to surgery if IC valve biopsies                            consistent with adenomatous tissue/malignancy. Procedure Code(s):        ---  Professional ---                           [phone], Colonoscopy, flexible; with removal of                            tumor(s), polyp(s), or other lesion(s) by snare                            technique                           45380, 59, Colonoscopy, flexible; with biopsy,                            single or multiple Diagnosis Code(s):        --- Professional ---                           K64.8, Other hemorrhoids                           K52.9, Noninfective gastroenteritis and colitis,                            unspecified                           K63.5, Polyp of colon                           K63.89, Other specified diseases of intestine                           K57.32, Diverticulitis of large intestine without                            perforation or abscess without bleeding                           K57.30, Diverticulosis of large intestine without                            perforation or abscess without bleeding                           R93.3, Abnormal findings on diagnostic  imaging of                            other parts of digestive tract CPT copyright 2019 American [employer] Association. All rights reserved. The codes documented in this report are preliminary and upon coder review may  be revised to meet current compliance requirements. Elon Alas. Abbey Chatters, DO [place] Abbey Chatters, DO 08/11/2020 8:07:06 AM This report has been signed electronically. Number of Addenda: 0

## 2020-08-11 NOTE — Anesthesia Preprocedure Evaluation (Signed)
Anesthesia Evaluation  Patient identified by MRN, date of birth, ID band Patient awake    Reviewed: Allergy & Precautions, NPO status , Patient's Chart, lab work & pertinent test results  History of Anesthesia Complications Negative for: history of anesthetic complications  Airway Mallampati: II  TM Distance: >3 FB Neck ROM: Full    Dental  (+) Dental Advisory Given, Teeth Intact   Pulmonary shortness of breath and with exertion, Current SmokerPatient did not abstain from smoking.,    breath sounds clear to auscultation       Cardiovascular + CAD  Normal cardiovascular exam+ dysrhythmias  Rhythm:Regular Rate:Normal   04-01-2016 in epic, reproduced chest pain but no EKG changes;  cardiac cath 04-06-2016 moderate diffuse nonobstructive disease, aggressive medical management   Neuro/Psych negative neurological ROS  negative psych ROS   GI/Hepatic GERD  ,(+)     substance abuse  alcohol use,   Endo/Other  negative endocrine ROS  Renal/GU negative Renal ROS     Musculoskeletal  (+) Arthritis  (back pain),   Abdominal   Peds  Hematology negative hematology ROS (+)   Anesthesia Other Findings Stress test 03/2016 Blood pressure demonstrated a normal response to exercise. There was no ST segment deviation noted during stress. No T wave inversion was noted during stress.     Reproductive/Obstetrics negative OB ROS                            Anesthesia Physical Anesthesia Plan  ASA: 3  Anesthesia Plan: General   Post-op Pain Management:    Induction: Intravenous  PONV Risk Score and Plan: Propofol infusion  Airway Management Planned: Nasal Cannula and Natural Airway  Additional Equipment:   Intra-op Plan:   Post-operative Plan:   Informed Consent: I have reviewed the patients History and Physical, chart, labs and discussed the procedure including the risks, benefits and  alternatives for the proposed anesthesia with the patient or authorized representative who has indicated his/her understanding and acceptance.     Dental advisory given  Plan Discussed with: CRNA and Surgeon  Anesthesia Plan Comments:        Anesthesia Quick Evaluation

## 2020-08-11 NOTE — Transfer of Care (Signed)
Immediate Anesthesia Transfer of Care Note  Patient: Derrick Turner  Procedure(s) Performed: COLONOSCOPY WITH PROPOFOL BIOPSY POLYPECTOMY  Patient Location: Short Stay  Anesthesia Type:General  Level of Consciousness: drowsy  Airway & Oxygen Therapy: Patient Spontanous Breathing  Post-op Assessment: Report given to RN and Post -op Vital signs reviewed and stable  Post vital signs: Reviewed and stable  Last Vitals:  Vitals Value Taken Time  BP    Temp    Pulse    Resp 24   SpO2 95%     Last Pain:  Vitals:   08/11/20 0646  TempSrc: Oral  PainSc: 0-No pain         Complications: No notable events documented.

## 2020-08-11 NOTE — H&P (Signed)
Primary Care Physician:  Pcp, No Primary Gastroenterologist:  Dr. Abbey Chatters  Pre-Procedure History & Physical: HPI:  Derrick Turner is a 50 y.o. male is here for a colonoscopy to be performed for sigmoid diverticulitis s/p treatment with course of Augmentin. Feeling better after antibiotics. Diverticulitis confirmed via CT scan. No previous colonoscopy. No family history of colon malignancy.   Past Medical History:  Diagnosis Date   Coronary artery disease cardiologist--- dr croitoru   ETT 04-01-2016 in epic, reproduced chest pain but no EKG changes;  cardiac cath 04-06-2016 moderate diffuse nonobstructive disease, aggressive medical management   History of 2019 novel coronavirus disease (COVID-19) 03/02/2020   per pt tested positive covid, copy of result in epic, stated mild symptoms that resolved   History of kidney stones    Incomplete right bundle branch block    Right ureteral calculus    Wears glasses     Past Surgical History:  Procedure Laterality Date   APPENDECTOMY  teen   BACK SURGERY     two   [place], URETEROSCOPY AND STENT PLACEMENT Right 04/14/2020   Procedure: [place], URETEROSCOPY AND STENT PLACEMENT;  Surgeon: Remi Haggard, MD;  Location: [facility];  Service: Urology;  Laterality: Right;   EXTRACORPOREAL SHOCK WAVE LITHOTRIPSY Right 03/23/2020   Procedure: EXTRACORPOREAL SHOCK WAVE LITHOTRIPSY (ESWL);  Surgeon: Lucas Mallow, MD;  Location: [facility];  Service: Urology;  Laterality: Right;   HOLMIUM LASER APPLICATION Right 92/12/9415   Procedure: HOLMIUM LASER APPLICATION;  Surgeon: Remi Haggard, MD;  Location: [facility];  Service: Urology;  Laterality: Right;   LAPAROSCOPIC CHOLECYSTECTOMY  2012 approx   LEFT HEART CATH AND CORONARY ANGIOGRAPHY N/A 04/06/2016   Procedure: Left Heart Cath and Coronary Angiography;  Surgeon: Sherren Mocha, MD;   Location: [facility];  Service: Cardiovascular;  Laterality: N/A;   ORCHIECTOMY Right 1990s   per pt benign cyst   SHOULDER SURGERY Bilateral left 2019;  right 2016   SHOULDER SURGERY Left    SHOULDER SURGERY Right     Prior to Admission medications   Medication Sig Start Date End Date Taking? Authorizing Provider  aspirin 81 MG EC tablet TAKE 1 TABLET (81 MG TOTAL) BY MOUTH DAILY. 05/19/16  Yes Riccio, Angela C, DO  atorvastatin (LIPITOR) 40 MG tablet Take 1 tablet (40 mg total) by mouth daily. Patient taking differently: Take 40 mg by mouth in the morning. 07/30/19  Yes Almyra Deforest, PA  cetirizine (ZYRTEC) 10 MG tablet Take 10 mg by mouth daily.   Yes [provider]  docusate sodium (COLACE) 100 MG capsule Take 100 mg by mouth daily.   Yes [provider]  gabapentin (NEURONTIN) 300 MG capsule Take 600 mg by mouth at bedtime. 03/11/16  Yes [provider]  ibuprofen (ADVIL) 200 MG tablet Take 800 mg by mouth 2 (two) times daily.   Yes [provider]  Lactobacillus Rhamnosus, GG, (CULTURELLE) CAPS Take 1 capsule by mouth daily.   Yes [provider]  Melatonin 10 MG TABS Take 20 mg by mouth at bedtime.   Yes [provider]  polyethylene glycol-electrolytes (TRILYTE) 420 g solution Take 4,000 mLs by mouth as directed. 07/15/20  Yes Wah Sabic K, DO  predniSONE (STERAPRED UNI-PAK 21 TAB) 5 MG (21) TBPK tablet Take 5 mg by mouth as directed.   Yes [provider]  tiZANidine (ZANAFLEX) 4 MG tablet Take 4  mg by mouth every 8 (eight) hours as needed for muscle spasms.   Yes [provider]  nitroGLYCERIN (NITROSTAT) 0.4 MG SL tablet Place 1 tablet (0.4 mg total) under the tongue every 5 (five) minutes x 3 doses as needed for chest pain. 04/01/16   Steve Rattler, DO  ondansetron (ZOFRAN) 4 MG tablet Take 1 tablet (4 mg total) by mouth every 6 (six) hours as needed for nausea or vomiting. 06/23/20   Mahala Menghini,  PA-C  tamsulosin (FLOMAX) 0.4 MG CAPS capsule Take 1 capsule (0.4 mg total) by mouth daily. Patient not taking: No sig reported 03/23/20   Lucas Mallow, MD    Allergies as of 07/14/2020   (No Known Allergies)    Family History  Problem Relation Age of Onset   Heart attack Father    Heart attack Maternal Grandmother    Heart attack Maternal Grandfather    Colon cancer Neg Hx    Colon polyps Neg Hx     Social History   Socioeconomic History   Marital status: Married    Spouse name: Not on file   Number of children: Not on file   Years of education: Not on file   Highest education level: Not on file  Occupational History   Not on file  Tobacco Use   Smoking status: Every Day    Packs/day: 1.50    Years: 35.00    Pack years: 52.50    Types: Cigarettes   Smokeless tobacco: Never  Vaping Use   Vaping Use: Never used  Substance and Sexual Activity   Alcohol use: Yes    Comment: occasional   Drug use: Never   Sexual activity: Not on file  Other Topics Concern   Not on file  Social History Narrative   Not on file   Social Determinants of Health   Financial Resource Strain: Not on file  Food Insecurity: Not on file  Transportation Needs: Not on file  Physical Activity: Not on file  Stress: Not on file  Social Connections: Not on file  Intimate Partner Violence: Not on file    Review of Systems: See HPI, otherwise negative ROS  Physical Exam: Vital signs in last 24 hours: Temp:  [97.8 F (36.6 C)] 97.8 F (36.6 C) (07/05 0646) Pulse Rate:  [78] 78 (07/05 0646) BP: (127)/(84) 127/84 (07/05 0646) SpO2:  [95 %] 95 % (07/05 0646)   General:   Alert,  Well-developed, well-nourished, pleasant and cooperative in NAD Head:  Normocephalic and atraumatic. Eyes:  Sclera clear, no icterus.   Conjunctiva pink. Ears:  Normal auditory acuity. Nose:  No deformity, discharge,  or lesions. Mouth:  No deformity or lesions, dentition normal. Neck:  Supple; no masses  or thyromegaly. Lungs:  Clear throughout to auscultation.   No wheezes, crackles, or rhonchi. No acute distress. Heart:  Regular rate and rhythm; no murmurs, clicks, rubs,  or gallops. Abdomen:  Soft, nontender and nondistended. No masses, hepatosplenomegaly or hernias noted. Normal bowel sounds, without guarding, and without rebound.   Msk:  Symmetrical without gross deformities. Normal posture. Extremities:  Without clubbing or edema. Neurologic:  Alert and  oriented x4;  grossly normal neurologically. Skin:  Intact without significant lesions or rashes. Cervical Nodes:  No significant cervical adenopathy. Psych:  Alert and cooperative. Normal mood and affect.  Impression/Plan: Derrick Turner is here for a colonoscopy to be performed for sigmoid diverticulitis s/p treatment with course of Augmentin.  The risks  of the procedure including infection, bleed, or perforation as well as benefits, limitations, alternatives and imponderables have been reviewed with the patient. Questions have been answered. All parties agreeable.

## 2020-08-11 NOTE — Anesthesia Postprocedure Evaluation (Signed)
Anesthesia Post Note  Patient: Derrick Turner  Procedure(s) Performed: COLONOSCOPY WITH PROPOFOL BIOPSY POLYPECTOMY  Patient location during evaluation: Phase II Anesthesia Type: General Level of consciousness: awake and alert and oriented Pain management: pain level controlled Vital Signs Assessment: post-procedure vital signs reviewed and stable Respiratory status: spontaneous breathing and respiratory function stable Cardiovascular status: blood pressure returned to baseline and stable Postop Assessment: no apparent nausea or vomiting Anesthetic complications: no   No notable events documented.   Last Vitals:  Vitals:   08/11/20 0808 08/11/20 0816  BP: (!) 93/54 103/70  Pulse: 76   Resp: (!) 23   Temp: 36.6 C   SpO2:      Last Pain:  Vitals:   08/11/20 0808  TempSrc: Axillary  PainSc: 0-No pain                 [name]

## 2020-08-12 LAB — SURGICAL PATHOLOGY

## 2020-08-18 ENCOUNTER — Encounter: Payer: Self-pay | Admitting: General Surgery

## 2020-08-18 ENCOUNTER — Other Ambulatory Visit: Payer: Self-pay

## 2020-08-18 ENCOUNTER — Other Ambulatory Visit: Payer: Self-pay | Admitting: General Surgery

## 2020-08-18 ENCOUNTER — Ambulatory Visit (INDEPENDENT_AMBULATORY_CARE_PROVIDER_SITE_OTHER): Payer: BC Managed Care – PPO | Admitting: General Surgery

## 2020-08-18 VITALS — BP 131/79 | HR 68 | Temp 99.2°F | Resp 16 | Ht 70.0 in | Wt 227.0 lb

## 2020-08-18 DIAGNOSIS — K635 Polyp of colon: Secondary | ICD-10-CM

## 2020-08-18 MED ORDER — METRONIDAZOLE 500 MG PO TABS
500.0000 mg | ORAL_TABLET | Freq: Three times a day (TID) | ORAL | 0 refills | Status: DC
Start: 2020-08-18 — End: 2020-09-10

## 2020-08-18 MED ORDER — SUTAB 1479-225-188 MG PO TABS: 1.0000 | ORAL_TABLET | Freq: Once | ORAL | 0 refills | Status: DC

## 2020-08-18 MED ORDER — NEOMYCIN SULFATE 500 MG PO TABS: 1000.0000 mg | ORAL_TABLET | Freq: Three times a day (TID) | ORAL | 0 refills | Status: DC

## 2020-08-19 ENCOUNTER — Encounter (HOSPITAL_COMMUNITY): Payer: Self-pay | Admitting: Internal Medicine

## 2020-08-19 NOTE — H&P (View-Only) (Signed)
Derrick Turner; [id number]; [birth year]   HPI Patient is a 50 year old white male who was referred to my care by Dr. 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  Patient underwent a colonoscopy and was found to have a dysplastic lesion at the ileocecal valve.  He did also have a descending colon tubular adenoma that was fully resected.  It had no dysplasia present.  Patient has done well since the colonoscopy.  He denies any family history of colon cancer.  Patient was originally undergoing colonoscopy for history of sigmoid diverticulitis. Past Medical History:  Diagnosis Date   Coronary artery disease cardiologist--- dr croitoru   ETT 04-01-2016 in epic, reproduced chest pain but no EKG changes;  cardiac cath 04-06-2016 moderate diffuse nonobstructive disease, aggressive medical management   History of 2019 novel coronavirus disease (COVID-19) 03/02/2020   per pt tested positive covid, copy of result in epic, stated mild symptoms that resolved   History of kidney stones    Incomplete right bundle branch block    Right ureteral calculus    Wears glasses     Past Surgical History:  Procedure Laterality Date   APPENDECTOMY  teen   BACK SURGERY     two   BIOPSY  08/11/2020   Procedure: BIOPSY;  Surgeon: Eloise Harman, DO;  [facility]: [facility];  Service: Endoscopy;;   COLONOSCOPY WITH PROPOFOL N/A 08/11/2020   Procedure: COLONOSCOPY WITH PROPOFOL;  Surgeon: Eloise Harman, DO;  Location: [facility];  Service: Endoscopy;  Laterality: N/A;  7:30am   CYSTOSCOPY WITH RETROGRADE PYELOGRAM, URETEROSCOPY AND STENT PLACEMENT Right 04/14/2020   Procedure: CYSTOSCOPY WITH RETROGRADE PYELOGRAM, URETEROSCOPY AND STENT PLACEMENT;  Surgeon: Remi Haggard, MD;  Location: [facility];  Service: Urology;  Laterality: Right;   EXTRACORPOREAL SHOCK WAVE LITHOTRIPSY Right 03/23/2020   Procedure: EXTRACORPOREAL SHOCK WAVE LITHOTRIPSY (ESWL);  Surgeon: Lucas Mallow, MD;  Location: [facility];  Service: Urology;  Laterality: Right;   HOLMIUM LASER APPLICATION Right 33/00/7622   Procedure: HOLMIUM LASER APPLICATION;  Surgeon: Remi Haggard, MD;  Location: [facility];  Service: Urology;  Laterality: Right;   LAPAROSCOPIC CHOLECYSTECTOMY  2012 approx   LEFT HEART CATH AND CORONARY ANGIOGRAPHY N/A 04/06/2016   Procedure: Left Heart Cath and Coronary Angiography;  Surgeon: Sherren Mocha, MD;  Location: [facility];  Service: Cardiovascular;  Laterality: N/A;   ORCHIECTOMY Right 1990s   per pt benign cyst   POLYPECTOMY  08/11/2020   Procedure: POLYPECTOMY;  Surgeon: Eloise Harman, DO;  [facility]: [facility];  Service: Endoscopy;;   SHOULDER SURGERY Bilateral left 2019;  right 2016   SHOULDER SURGERY Left    SHOULDER SURGERY Right     Family History  Problem Relation Age of Onset   Heart attack Father    Heart attack Maternal Grandmother    Heart attack Maternal Grandfather    Colon cancer Neg Hx    Colon polyps Neg Hx     Current Outpatient Medications on File Prior to Visit  Medication Sig Dispense Refill   aspirin 81 MG EC tablet TAKE 1 TABLET (81 MG TOTAL) BY MOUTH DAILY. 90 tablet 3   atorvastatin (LIPITOR) 40 MG tablet TAKE 1 TABLET BY MOUTH EVERY DAY 90 tablet 3   cetirizine (ZYRTEC) 10 MG tablet Take 10 mg by mouth daily.     docusate sodium (COLACE) 100 MG capsule Take 100 mg by  mouth daily.     gabapentin (NEURONTIN) 300 MG capsule Take 600 mg by mouth at bedtime.  1   ibuprofen (ADVIL) 200 MG tablet Take 800 mg by mouth 2 (two) times daily.     Lactobacillus Rhamnosus, GG, (CULTURELLE) CAPS Take 1 capsule by mouth daily.     Melatonin 10 MG TABS Take 20 mg by mouth at bedtime.     ondansetron (ZOFRAN) 4 MG tablet Take 1 tablet (4 mg total) by mouth every 6 (six) hours as needed for nausea or vomiting. 20 tablet 0   No current facility-administered medications on file prior to  visit.    No Known Allergies  Social History   Substance and Sexual Activity  Alcohol Use Yes   Comment: occasional    Social History   Tobacco Use  Smoking Status Every Day   Packs/day: 1.50   Years: 35.00   Pack years: 52.50   Types: Cigarettes  Smokeless Tobacco Never    Review of Systems  Constitutional: Negative.   HENT:  Positive for sinus pain.   Eyes: Negative.   Respiratory: Negative.    Cardiovascular: Negative.   Gastrointestinal:  Positive for abdominal pain.  Genitourinary: Negative.   Musculoskeletal:  Positive for back pain.  Skin: Negative.   Neurological: Negative.   Endo/Heme/Allergies: Negative.   Psychiatric/Behavioral: Negative.     Objective   Vitals:   08/18/20 1406  BP: 131/79  Pulse: 68  Resp: 16  Temp: 99.2 F (37.3 C)  SpO2: 95%    Physical Exam Vitals reviewed.  Constitutional:      Appearance: Normal appearance. He is not ill-appearing.  HENT:     Head: Normocephalic and atraumatic.  Cardiovascular:     Rate and Rhythm: Normal rate and regular rhythm.     Heart sounds: Normal heart sounds. No murmur heard.   No friction rub. No gallop.  Pulmonary:     Effort: Pulmonary effort is normal. No respiratory distress.     Breath sounds: Normal breath sounds. No stridor. No wheezing, rhonchi or rales.  Abdominal:     General: Bowel sounds are normal. There is no distension.     Palpations: Abdomen is soft. There is no mass.     Tenderness: There is no abdominal tenderness. There is no guarding or rebound.  Skin:    General: Skin is warm and dry.  Neurological:     Mental Status: He is alert and oriented to person, place, and time.   Colonoscopy pathology report reviewed Assessment  Dysplastic colon polyp, ileocecal valve. Stable coronary artery disease Plan  Patient is scheduled for a partial colectomy on 09/02/2020.  The risks and benefits of the procedure including bleeding, infection, cardiopulmonary difficulties,  anastomotic leak, and the possibility of a blood transfusion were fully explained to the patient, who gave informed consent.  Sutabs, Flagyl, neomycin have been ordered for bowel preparation preoperatively.

## 2020-08-19 NOTE — Progress Notes (Signed)
Derrick Turner; [id number]; July 26, 1970   HPI Patient is a 49 year old white male who was referred to my care by Dr. 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  Patient underwent a colonoscopy and was found to have a dysplastic lesion at the ileocecal valve.  He did also have a descending colon tubular adenoma that was fully resected.  It had no dysplasia present.  Patient has done well since the colonoscopy.  He denies any family history of colon cancer.  Patient was originally undergoing colonoscopy for history of sigmoid diverticulitis. Past Medical History:  Diagnosis Date   Coronary artery disease cardiologist--- dr croitoru   ETT 04-01-2016 in epic, reproduced chest pain but no EKG changes;  cardiac cath 04-06-2016 moderate diffuse nonobstructive disease, aggressive medical management   History of 2019 novel coronavirus disease (COVID-19) 03/02/2020   per pt tested positive covid, copy of result in epic, stated mild symptoms that resolved   History of kidney stones    Incomplete right bundle branch block    Right ureteral calculus    Wears glasses     Past Surgical History:  Procedure Laterality Date   APPENDECTOMY  teen   BACK SURGERY     two   BIOPSY  08/11/2020   Procedure: BIOPSY;  Surgeon: Eloise Harman, DO;  [facility]: [facility];  Service: Endoscopy;;   COLONOSCOPY WITH PROPOFOL N/A 08/11/2020   Procedure: COLONOSCOPY WITH PROPOFOL;  Surgeon: Eloise Harman, DO;  Location: [facility];  Service: Endoscopy;  Laterality: N/A;  7:30am   CYSTOSCOPY WITH RETROGRADE PYELOGRAM, URETEROSCOPY AND STENT PLACEMENT Right 04/14/2020   Procedure: CYSTOSCOPY WITH RETROGRADE PYELOGRAM, URETEROSCOPY AND STENT PLACEMENT;  Surgeon: Remi Haggard, MD;  Location: [facility];  Service: Urology;  Laterality: Right;   EXTRACORPOREAL SHOCK WAVE LITHOTRIPSY Right 03/23/2020   Procedure: EXTRACORPOREAL SHOCK WAVE LITHOTRIPSY (ESWL);  Surgeon: Lucas Mallow, MD;  Location: [facility];  Service: Urology;  Laterality: Right;   HOLMIUM LASER APPLICATION Right 50/10/3816   Procedure: HOLMIUM LASER APPLICATION;  Surgeon: Remi Haggard, MD;  Location: [facility];  Service: Urology;  Laterality: Right;   LAPAROSCOPIC CHOLECYSTECTOMY  2012 approx   LEFT HEART CATH AND CORONARY ANGIOGRAPHY N/A 04/06/2016   Procedure: Left Heart Cath and Coronary Angiography;  Surgeon: Sherren Mocha, MD;  Location: [facility];  Service: Cardiovascular;  Laterality: N/A;   ORCHIECTOMY Right 1990s   per pt benign cyst   POLYPECTOMY  08/11/2020   Procedure: POLYPECTOMY;  Surgeon: Eloise Harman, DO;  [facility];  Service: Endoscopy;;   SHOULDER SURGERY Bilateral left 2019;  right 2016   SHOULDER SURGERY Left    SHOULDER SURGERY Right     Family History  Problem Relation Age of Onset   Heart attack Father    Heart attack Maternal Grandmother    Heart attack Maternal Grandfather    Colon cancer Neg Hx    Colon polyps Neg Hx     Current Outpatient Medications on File Prior to Visit  Medication Sig Dispense Refill   aspirin 81 MG EC tablet TAKE 1 TABLET (81 MG TOTAL) BY MOUTH DAILY. 90 tablet 3   atorvastatin (LIPITOR) 40 MG tablet TAKE 1 TABLET BY MOUTH EVERY DAY 90 tablet 3   cetirizine (ZYRTEC) 10 MG tablet Take 10 mg by mouth daily.     docusate sodium (COLACE) 100 MG capsule Take 100 mg by  mouth daily.     gabapentin (NEURONTIN) 300 MG capsule Take 600 mg by mouth at bedtime.  1   ibuprofen (ADVIL) 200 MG tablet Take 800 mg by mouth 2 (two) times daily.     Lactobacillus Rhamnosus, GG, (CULTURELLE) CAPS Take 1 capsule by mouth daily.     Melatonin 10 MG TABS Take 20 mg by mouth at bedtime.     ondansetron (ZOFRAN) 4 MG tablet Take 1 tablet (4 mg total) by mouth every 6 (six) hours as needed for nausea or vomiting. 20 tablet 0   No current facility-administered medications on file prior to  visit.    No Known Allergies  Social History   Substance and Sexual Activity  Alcohol Use Yes   Comment: occasional    Social History   Tobacco Use  Smoking Status Every Day   Packs/day: 1.50   Years: 35.00   Pack years: 52.50   Types: Cigarettes  Smokeless Tobacco Never    Review of Systems  Constitutional: Negative.   HENT:  Positive for sinus pain.   Eyes: Negative.   Respiratory: Negative.    Cardiovascular: Negative.   Gastrointestinal:  Positive for abdominal pain.  Genitourinary: Negative.   Musculoskeletal:  Positive for back pain.  Skin: Negative.   Neurological: Negative.   Endo/Heme/Allergies: Negative.   Psychiatric/Behavioral: Negative.     Objective   Vitals:   08/18/20 1406  BP: 131/79  Pulse: 68  Resp: 16  Temp: 99.2 F (37.3 C)  SpO2: 95%    Physical Exam Vitals reviewed.  Constitutional:      Appearance: Normal appearance. He is not ill-appearing.  HENT:     Head: Normocephalic and atraumatic.  Cardiovascular:     Rate and Rhythm: Normal rate and regular rhythm.     Heart sounds: Normal heart sounds. No murmur heard.   No friction rub. No gallop.  Pulmonary:     Effort: Pulmonary effort is normal. No respiratory distress.     Breath sounds: Normal breath sounds. No stridor. No wheezing, rhonchi or rales.  Abdominal:     General: Bowel sounds are normal. There is no distension.     Palpations: Abdomen is soft. There is no mass.     Tenderness: There is no abdominal tenderness. There is no guarding or rebound.  Skin:    General: Skin is warm and dry.  Neurological:     Mental Status: He is alert and oriented to person, place, and time.   Colonoscopy pathology report reviewed Assessment  Dysplastic colon polyp, ileocecal valve. Stable coronary artery disease Plan  Patient is scheduled for a partial colectomy on 09/02/2020.  The risks and benefits of the procedure including bleeding, infection, cardiopulmonary difficulties,  anastomotic leak, and the possibility of a blood transfusion were fully explained to the patient, who gave informed consent.  Sutabs, Flagyl, neomycin have been ordered for bowel preparation preoperatively.

## 2020-08-28 NOTE — Patient Instructions (Addendum)
Derrick Turner  08/28/2020     '@PREFPERIOPPHARMACY'$ @   Your procedure is scheduled on  09/02/2020.   Report to [name] at  [email or web address]   Call this number if you have problems the morning of surgery:  [phone]   Remember:  Follow the diet and prep instructions given to you by Dr Arnoldo Morale.     Take these medicines the morning of surgery with A SIP OF WATER                      gabapentin and zofran (if needed).     Do not wear jewelry, make-up or nail polish.  Do not wear lotions, powders, or perfumes, or deodorant.  Do not shave 48 hours prior to surgery.  Men may shave face and neck.  Do not bring valuables to the hospital.  [facility] is not responsible for any belongings or valuables.  Contacts, dentures or bridgework may not be worn into surgery.  Leave your suitcase in the car.  After surgery it may be brought to your room.  For patients admitted to the hospital, discharge time will be determined by your treatment team.  Patients discharged the day of surgery will not be allowed to drive home.    Special instructions:       DO NOT smoke tobacco or vape for 24 hours before your procedure.  Please read over the following fact sheets that you were given. Pain Booklet, Coughing and Deep Breathing, Blood Transfusion Information, Lab Information, Surgical Site Infection Prevention, Anesthesia Post-op Instructions, and Care and Recovery After Surgery        General Anesthesia, Adult, Care After This sheet gives you information about how to care for yourself after your procedure. Your health care provider may also give you more specific instructions. If you have problems or questions, contact your health careprovider. What can I expect after the procedure? After the procedure, the following side effects are common: Pain or discomfort at the IV site. Nausea. Vomiting. Sore throat. Trouble concentrating. Feeling cold or chills. Feeling weak or  tired. Sleepiness and fatigue. Soreness and body aches. These side effects can affect parts of the body that were not involved in surgery. Follow these instructions at home: For the time period you were told by your health care provider:  Rest. Do not participate in activities where you could fall or become injured. Do not drive or use machinery. Do not drink alcohol. Do not take sleeping pills or medicines that cause drowsiness. Do not make important decisions or sign legal documents. Do not take care of children on your own.  Eating and drinking Follow any instructions from your health care provider about eating or drinking restrictions. When you feel hungry, start by eating small amounts of foods that are soft and easy to digest (bland), such as toast. Gradually return to your regular diet. Drink enough fluid to keep your urine pale yellow. If you vomit, rehydrate by drinking water, juice, or clear broth. General instructions If you have sleep apnea, surgery and certain medicines can increase your risk for breathing problems. Follow instructions from your health care provider about wearing your sleep device: Anytime you are sleeping, including during daytime naps. While taking prescription pain medicines, sleeping medicines, or medicines that make you drowsy. Have a responsible adult stay with you for the time you are told. It is important to have someone help care for you until you are  awake and alert. Return to your normal activities as told by your health care provider. Ask your health care provider what activities are safe for you. Take over-the-counter and prescription medicines only as told by your health care provider. If you smoke, do not smoke without supervision. Keep all follow-up visits as told by your health care provider. This is important. Contact a health care provider if: You have nausea or vomiting that does not get better with medicine. You cannot eat or drink  without vomiting. You have pain that does not get better with medicine. You are unable to pass urine. You develop a skin rash. You have a fever. You have redness around your IV site that gets worse. Get help right away if: You have difficulty breathing. You have chest pain. You have blood in your urine or stool, or you vomit blood. Summary After the procedure, it is common to have a sore throat or nausea. It is also common to feel tired. Have a responsible adult stay with you for the time you are told. It is important to have someone help care for you until you are awake and alert. When you feel hungry, start by eating small amounts of foods that are soft and easy to digest (bland), such as toast. Gradually return to your regular diet. Drink enough fluid to keep your urine pale yellow. Return to your normal activities as told by your health care provider. Ask your health care provider what activities are safe for you. This information is not intended to replace advice given to you by your health care provider. Make sure you discuss any questions you have with your healthcare provider. Document Revised: 10/10/2019 Document Reviewed: 05/09/2019 Elsevier Patient Education  2022 [place]. How to Use Chlorhexidine for Bathing Chlorhexidine gluconate (CHG) is a germ-killing (antiseptic) solution that is used to clean the skin. It can get rid of the bacteria that normally live on the skin and can keep them away for about 24 hours. To clean your skin with CHG, you may be given: A CHG solution to use in the shower or as part of a sponge bath. A prepackaged cloth that contains CHG. Cleaning your skin with CHG may help lower the risk for infection: While you are staying in the intensive care unit of the hospital. If you have a vascular access, such as a central line, to provide short-term or long-term access to your veins. If you have a catheter to drain urine from your bladder. If you are on a  ventilator. A ventilator is a machine that helps you breathe by moving air in and out of your lungs. After surgery. What are the risks? Risks of using CHG include: A skin reaction. Hearing loss, if CHG gets in your ears. Eye injury, if CHG gets in your eyes and is not rinsed out. The CHG product catching fire. Make sure that you avoid smoking and flames after applying CHG to your skin. Do not use CHG: If you have a chlorhexidine allergy or have previously reacted to chlorhexidine. On babies younger than 68 months of age. How to use CHG solution Use CHG only as told by your health care provider, and follow the instructions on the label. Use the full amount of CHG as directed. Usually, this is one bottle. During a shower Follow these steps when using CHG solution during a shower (unless your health care provider gives you different instructions): Start the shower. Use your normal soap and shampoo to wash your face  and hair. Turn off the shower or move out of the shower stream. Pour the CHG onto a clean washcloth. Do not use any type of brush or rough-edged sponge. Starting at your neck, lather your body down to your toes. Make sure you follow these instructions: If you will be having surgery, pay special attention to the part of your body where you will be having surgery. Scrub this area for at least 1 minute. Do not use CHG on your head or face. If the solution gets into your ears or eyes, rinse them well with water. Avoid your genital area. Avoid any areas of skin that have broken skin, cuts, or scrapes. Scrub your back and under your arms. Make sure to wash skin folds. Let the lather sit on your skin for 1-2 minutes or as long as told by your health care provider. Thoroughly rinse your entire body in the shower. Make sure that all body creases and crevices are rinsed well. Dry off with a clean towel. Do not put any substances on your body afterward--such as powder, lotion, or  perfume--unless you are told to do so by your health care provider. Only use lotions that are recommended by the manufacturer. Put on clean clothes or pajamas. If it is the night before your surgery, sleep in clean sheets.  During a sponge bath Follow these steps when using CHG solution during a sponge bath (unless your health care provider gives you different instructions): Use your normal soap and shampoo to wash your face and hair. Pour the CHG onto a clean washcloth. Starting at your neck, lather your body down to your toes. Make sure you follow these instructions: If you will be having surgery, pay special attention to the part of your body where you will be having surgery. Scrub this area for at least 1 minute. Do not use CHG on your head or face. If the solution gets into your ears or eyes, rinse them well with water. Avoid your genital area. Avoid any areas of skin that have broken skin, cuts, or scrapes. Scrub your back and under your arms. Make sure to wash skin folds. Let the lather sit on your skin for 1-2 minutes or as long as told by your health care provider. Using a different clean, wet washcloth, thoroughly rinse your entire body. Make sure that all body creases and crevices are rinsed well. Dry off with a clean towel. Do not put any substances on your body afterward--such as powder, lotion, or perfume--unless you are told to do so by your health care provider. Only use lotions that are recommended by the manufacturer. Put on clean clothes or pajamas. If it is the night before your surgery, sleep in clean sheets. How to use CHG prepackaged cloths Only use CHG cloths as told by your health care provider, and follow the instructions on the label. Use the CHG cloth on clean, dry skin. Do not use the CHG cloth on your head or face unless your health care provider tells you to. When washing with the CHG cloth: Avoid your genital area. Avoid any areas of skin that have broken  skin, cuts, or scrapes. Before surgery Follow these steps when using a CHG cloth to clean before surgery (unless your health care provider gives you different instructions): Using the CHG cloth, vigorously scrub the part of your body where you will be having surgery. Scrub using a back-and-forth motion for 3 minutes. The area on your body should be completely wet with  CHG when you are done scrubbing. Do not rinse. Discard the cloth and let the area air-dry. Do not put any substances on the area afterward, such as powder, lotion, or perfume. Put on clean clothes or pajamas. If it is the night before your surgery, sleep in clean sheets.  For general bathing Follow these steps when using CHG cloths for general bathing (unless your health care provider gives you different instructions). Use a separate CHG cloth for each area of your body. Make sure you wash between any folds of skin and between your fingers and toes. Wash your body in the following order, switching to a new cloth after each step: The front of your neck, shoulders, and chest. Both of your arms, under your arms, and your hands. Your stomach and groin area, avoiding the genitals. Your right leg and foot. Your left leg and foot. The back of your neck, your back, and your buttocks. Do not rinse. Discard the cloth and let the area air-dry. Do not put any substances on your body afterward--such as powder, lotion, or perfume--unless you are told to do so by your health care provider. Only use lotions that are recommended by the manufacturer. Put on clean clothes or pajamas. Contact a health care provider if: Your skin gets irritated after scrubbing. You have questions about using your solution or cloth. Get help right away if: Your eyes become very red or swollen. Your eyes itch badly. Your skin itches badly and is red or swollen. Your hearing changes. You have trouble seeing. You have swelling or tingling in your mouth or  throat. You have trouble breathing. You swallow any chlorhexidine. Summary Chlorhexidine gluconate (CHG) is a germ-killing (antiseptic) solution that is used to clean the skin. Cleaning your skin with CHG may help to lower your risk for infection. You may be given CHG to use for bathing. It may be in a bottle or in a prepackaged cloth to use on your skin. Carefully follow your health care provider's instructions and the instructions on the product label. Do not use CHG if you have a chlorhexidine allergy. Contact your health care provider if your skin gets irritated after scrubbing. This information is not intended to replace advice given to you by your health care provider. Make sure you discuss any questions you have with your healthcare provider. Document Revised: 06/07/2019 Document Reviewed: 07/12/2019 Elsevier Patient Education  [place]. Ileocecal Resection, Care After This sheet gives you information about how to care for yourself after your procedure. Your health care provider may also give you more specific instructions. If you have problems or questions, contact your health careprovider. What can I expect after the procedure? After the procedure, it is common to have: Pain in your abdomen, especially along your incision. You will be given medicines to control pain. Bloating, nausea, or diarrhea. Tiredness (fatigue). This is normal during recovery. Your energy level will return to normal over several weeks. Follow these instructions at home: Medicines Take over-the-counter and prescription medicines only as told by your health care provider. Ask your health care provider if the medicine prescribed to you: Requires you to avoid driving or using machinery. Can cause constipation. You may need to take these actions to prevent or treat constipation: Drink enough fluid to keep your urine pale yellow. Take over-the-counter or prescription medicines. Eat foods that are high in  fiber, such as beans, whole grains, and fresh fruits and vegetables. Limit foods that are high in fat and processed sugars,  such as fried or sweet foods. If you were prescribed an antibiotic medicine, take it as told by your health care provider. Do not stop using the antibiotic even if you start to feel better. If you were prescribed pain medicine, do not drink alcohol while taking the medicine. Eating and drinking Follow instructions from your health care provider about eating or drinking restrictions. Follow instructions about what to eat in the first few days after surgery. You may need to introduce foods slowly by starting with a soft diet. Eat six small, high-protein meals a day instead of three larger meals. Add new foods back into your diet slowly. Work with your health care provider or a dietitian to maintain good nutrition. Incision care  Follow instructions from your health care provider about how to take care of your incision or incisions. Make sure you: Wash your hands with soap and water for at least 20 seconds before and after applying medicine to the area or changing your bandage (dressing). If soap and water are not available, use hand sanitizer. Change your dressing as told by your health care provider. Leave stitches (sutures), staples, skin glue, or adhesive strips in place. These skin closures may need to stay in place for 2 weeks or longer. If adhesive strip edges start to loosen and curl up, you may trim the loose edges. Do not remove adhesive strips completely unless your health care provider tells you to do that. Keep your incision area clean and dry. Check your incision area every day for signs of infection. Check for: More redness, swelling, or pain. Fluid or blood. Warmth. Pus or a bad smell. Do not take baths, swim, or use a hot tub until your health care provider approves. Ask your health care provider if you may take showers. You may only be allowed to take sponge  baths.  Activity  Rest as told by your health care provider. Avoid sitting for a long time without moving. Get up to take short walks every 1-2 hours. This is important to improve blood flow and breathing. Ask for help if you feel weak or unsteady. Do not lift anything that is heavier than 10 lb (4.5 kg), or the limit that you are told, until your health care provider says that it is safe. Do not do activities that take a lot of effort, such as running or aerobics, for at least 6 weeks or as directed. Return to your normal activities as told by your health care provider. Ask your health care provider what activities are safe for you.  General instructions Practice deep breathing and coughing. This helps to keep your lungs clear and prevent pneumonia. If it hurts to cough, try holding a pillow against your abdomen when you cough. Do not use any products that contain nicotine or tobacco, such as cigarettes, e-cigarettes, and chewing tobacco. These can delay incision healing after surgery. If you need help quitting, ask your health care provider. Wear compression stockings as told by your health care provider. These stockings help to prevent blood clots and reduce swelling in your legs. Keep all follow-up visits as told by your health care provider. This is important. Return as directed to have your sutures or staples removed. Contact a health care provider if: You have a fever or chills. You have pain that is not relieved with medicine. You have swelling in your abdomen, nausea, or vomiting. You have any of these signs of infection: More redness, swelling, or pain around an incision. Fluid or  blood coming from an incision. Warmth coming from an incision. Pus or a bad smell coming from an incision. Get help right away if: You have bowel symptoms. These may include: Heavy bleeding from your rectum. Black, tarry stool (feces). Not having a bowel movement for 4 or more days. An incision  opens. Your legs or arms become painful, red, or swollen. You have chest pain or trouble breathing. You cannot have a bowel movement or cannot pass gas. You have severe pain, or nausea or vomiting that does not go away. You have warmth, swelling, or pain in your leg. These symptoms may represent a serious problem that is an emergency. Do not wait to see if the symptoms will go away. Get medical help right away. Call your local emergency services (911 in the [place] Do not drive yourself to the hospital. Summary After the procedure, it is common to have pain, bloating, nausea, and tiredness. Take over-the-counter and prescription medicines only as told by your health care provider. Follow instructions from your health care provider about how to take care of your incision area. Check your incision area every day for signs of infection. Avoid sitting for a long time without moving. Get up to take short walks every 1-2 hours. This is important to improve blood flow and breathing. Ask for help if you feel weak or unsteady. Contact a health care provider if you have pain that is not relieved with medicine or if you have any signs of infection. This information is not intended to replace advice given to you by your health care provider. Make sure you discuss any questions you have with your healthcare provider. Document Revised: 12/07/2018 Document Reviewed: 12/07/2018 Elsevier Patient Education  2022 [employer].

## 2020-08-31 ENCOUNTER — Encounter (HOSPITAL_COMMUNITY)
Admission: RE | Admit: 2020-08-31 | Discharge: 2020-08-31 | Disposition: A | Discharge status: Home / Self Care | Payer: BC Managed Care – PPO | Source: Ambulatory Visit | Attending: General Surgery | Admitting: General Surgery

## 2020-08-31 ENCOUNTER — Other Ambulatory Visit (HOSPITAL_COMMUNITY)
Admission: RE | Admit: 2020-08-31 | Discharge: 2020-08-31 | Disposition: A | Discharge status: Home / Self Care | Payer: BC Managed Care – PPO | Source: Ambulatory Visit | Attending: General Surgery | Admitting: General Surgery

## 2020-08-31 ENCOUNTER — Other Ambulatory Visit: Payer: Self-pay

## 2020-08-31 ENCOUNTER — Ambulatory Visit (HOSPITAL_COMMUNITY)
Admission: RE | Admit: 2020-08-31 | Discharge: 2020-08-31 | Disposition: A | Discharge status: Home / Self Care | Payer: BC Managed Care – PPO | Source: Ambulatory Visit | Attending: General Surgery | Admitting: General Surgery

## 2020-08-31 DIAGNOSIS — Z01818 Encounter for other preprocedural examination: Secondary | ICD-10-CM | POA: Insufficient documentation

## 2020-08-31 DIAGNOSIS — K635 Polyp of colon: Secondary | ICD-10-CM | POA: Insufficient documentation

## 2020-08-31 DIAGNOSIS — Z20822 Contact with and (suspected) exposure to covid-19: Secondary | ICD-10-CM | POA: Insufficient documentation

## 2020-08-31 DIAGNOSIS — Z01812 Encounter for preprocedural laboratory examination: Secondary | ICD-10-CM | POA: Insufficient documentation

## 2020-08-31 LAB — CBC WITH DIFFERENTIAL/PLATELET
Abs Immature Granulocytes: 0.02 10*3/uL (ref 0.00–0.07)
Basophils Absolute: 0 10*3/uL (ref 0.0–0.1)
Basophils Relative: 0 %
Eosinophils Absolute: 0.2 10*3/uL (ref 0.0–0.5)
Eosinophils Relative: 2 %
HCT: 46.2 % (ref 39.0–52.0)
Hemoglobin: 15.5 g/dL (ref 13.0–17.0)
Immature Granulocytes: 0 %
Lymphocytes Relative: 22 %
Lymphs Abs: 2 10*3/uL (ref 0.7–4.0)
MCH: 29.8 pg (ref 26.0–34.0)
MCHC: 33.5 g/dL (ref 30.0–36.0)
MCV: 88.7 fL (ref 80.0–100.0)
Monocytes Absolute: 0.5 10*3/uL (ref 0.1–1.0)
Monocytes Relative: 5 %
Neutro Abs: 6.4 10*3/uL (ref 1.7–7.7)
Neutrophils Relative %: 71 %
Platelets: 151 10*3/uL (ref 150–400)
RBC: 5.21 MIL/uL (ref 4.22–5.81)
RDW: 13.2 % (ref 11.5–15.5)
WBC: 9.1 10*3/uL (ref 4.0–10.5)
nRBC: 0 % (ref 0.0–0.2)

## 2020-08-31 LAB — COMPREHENSIVE METABOLIC PANEL
ALT: 18 U/L (ref 0–44)
AST: 17 U/L (ref 15–41)
Albumin: 4.3 g/dL (ref 3.5–5.0)
Alkaline Phosphatase: 70 U/L (ref 38–126)
Anion gap: 7 (ref 5–15)
BUN: 20 mg/dL (ref 6–20)
CO2: 25 mmol/L (ref 22–32)
Calcium: 8.8 mg/dL — ABNORMAL LOW (ref 8.9–10.3)
Chloride: 103 mmol/L (ref 98–111)
Creatinine, Ser: 0.61 mg/dL (ref 0.61–1.24)
GFR, Estimated: >60 mL/min (ref >60)
Glucose, Bld: 142 mg/dL — ABNORMAL HIGH (ref 70–99)
Potassium: 3.4 mmol/L — ABNORMAL LOW (ref 3.5–5.1)
Sodium: 135 mmol/L (ref 135–145)
Total Bilirubin: 0.7 mg/dL (ref 0.3–1.2)
Total Protein: 7.3 g/dL (ref 6.5–8.1)

## 2020-08-31 LAB — TYPE AND SCREEN
ABO/RH(D): O POS
Antibody Screen: NEGATIVE

## 2020-09-01 LAB — SARS CORONAVIRUS 2 (TAT 6-24 HRS): SARS Coronavirus 2: NEGATIVE

## 2020-09-01 LAB — CEA: CEA: 5.3 ng/mL — ABNORMAL HIGH (ref 0.0–4.7)

## 2020-09-02 ENCOUNTER — Inpatient Hospital Stay (HOSPITAL_COMMUNITY)
Admission: RE | Admit: 2020-09-02 | Discharge: 2020-09-04 | DRG: 331 | Disposition: A | Discharge status: Home / Self Care | Payer: BC Managed Care – PPO | Attending: General Surgery | Admitting: General Surgery

## 2020-09-02 ENCOUNTER — Inpatient Hospital Stay (HOSPITAL_COMMUNITY): Payer: BC Managed Care – PPO | Admitting: Certified Registered Nurse Anesthetist

## 2020-09-02 ENCOUNTER — Encounter (HOSPITAL_COMMUNITY): Payer: Self-pay | Admitting: General Surgery

## 2020-09-02 ENCOUNTER — Encounter (HOSPITAL_COMMUNITY)
Admission: RE | Disposition: A | Discharge status: Home / Self Care | Payer: Self-pay | Source: Home / Self Care | Attending: General Surgery

## 2020-09-02 DIAGNOSIS — D122 Benign neoplasm of ascending colon: Secondary | ICD-10-CM | POA: Diagnosis present

## 2020-09-02 DIAGNOSIS — F17210 Nicotine dependence, cigarettes, uncomplicated: Secondary | ICD-10-CM | POA: Diagnosis present

## 2020-09-02 DIAGNOSIS — Z8249 Family history of ischemic heart disease and other diseases of the circulatory system: Secondary | ICD-10-CM | POA: Diagnosis not present

## 2020-09-02 DIAGNOSIS — Z8616 Personal history of COVID-19: Secondary | ICD-10-CM | POA: Diagnosis not present

## 2020-09-02 DIAGNOSIS — I2510 Atherosclerotic heart disease of native coronary artery without angina pectoris: Secondary | ICD-10-CM | POA: Diagnosis present

## 2020-09-02 DIAGNOSIS — K635 Polyp of colon: Secondary | ICD-10-CM

## 2020-09-02 DIAGNOSIS — Z9049 Acquired absence of other specified parts of digestive tract: Secondary | ICD-10-CM

## 2020-09-02 DIAGNOSIS — K219 Gastro-esophageal reflux disease without esophagitis: Secondary | ICD-10-CM | POA: Diagnosis present

## 2020-09-02 DIAGNOSIS — Z7982 Long term (current) use of aspirin: Secondary | ICD-10-CM

## 2020-09-02 DIAGNOSIS — Z79899 Other long term (current) drug therapy: Secondary | ICD-10-CM | POA: Diagnosis not present

## 2020-09-02 HISTORY — PX: PARTIAL COLECTOMY: SHX5273

## 2020-09-02 SURGERY — COLECTOMY, PARTIAL
Anesthesia: General | Site: Abdomen | Laterality: Right

## 2020-09-02 MED ORDER — ESMOLOL HCL 100 MG/10ML IV SOLN
INTRAVENOUS | Status: AC
  Filled 2020-09-02: qty 10

## 2020-09-02 MED ORDER — LIDOCAINE HCL (PF) 2 % IJ SOLN
INTRAMUSCULAR | Status: AC
  Filled 2020-09-02: qty 5

## 2020-09-02 MED ORDER — LIDOCAINE HCL (CARDIAC) PF 100 MG/5ML IV SOSY
PREFILLED_SYRINGE | INTRAVENOUS | Status: DC | PRN
  Administered 2020-09-02: 100 mg via INTRAVENOUS

## 2020-09-02 MED ORDER — BUPIVACAINE LIPOSOME 1.3 % IJ SUSP
INTRAMUSCULAR | Status: AC
  Filled 2020-09-02: qty 20

## 2020-09-02 MED ORDER — ATORVASTATIN CALCIUM 40 MG PO TABS
40.0000 mg | ORAL_TABLET | Freq: Every day | ORAL | Status: DC
  Administered 2020-09-03 – 2020-09-04 (×2): 40 mg via ORAL
  Filled 2020-09-02 (×5): qty 1

## 2020-09-02 MED ORDER — ONDANSETRON 4 MG PO TBDP: 4.0000 mg | ORAL_TABLET | Freq: Four times a day (QID) | ORAL | Status: DC | PRN

## 2020-09-02 MED ORDER — DEXMEDETOMIDINE (PRECEDEX) IN NS 20 MCG/5ML (4 MCG/ML) IV SYRINGE
PREFILLED_SYRINGE | INTRAVENOUS | Status: DC | PRN
  Administered 2020-09-02: 8 ug via INTRAVENOUS
  Administered 2020-09-02: 12 ug via INTRAVENOUS

## 2020-09-02 MED ORDER — ONDANSETRON HCL 4 MG/2ML IJ SOLN
INTRAMUSCULAR | Status: AC
  Filled 2020-09-02: qty 2

## 2020-09-02 MED ORDER — DEXMEDETOMIDINE (PRECEDEX) IN NS 20 MCG/5ML (4 MCG/ML) IV SYRINGE
PREFILLED_SYRINGE | INTRAVENOUS | Status: AC
  Filled 2020-09-02: qty 5

## 2020-09-02 MED ORDER — LACTATED RINGERS IV SOLN: INTRAVENOUS | Status: DC

## 2020-09-02 MED ORDER — CHLORHEXIDINE GLUCONATE CLOTH 2 % EX PADS: 6.0000 | MEDICATED_PAD | Freq: Once | CUTANEOUS | Status: DC

## 2020-09-02 MED ORDER — SODIUM CHLORIDE 0.9 % IV SOLN
1.0000 g | INTRAVENOUS | Status: AC
  Administered 2020-09-02: 1 g via INTRAVENOUS
  Filled 2020-09-02: qty 1

## 2020-09-02 MED ORDER — PROPOFOL 10 MG/ML IV BOLUS
INTRAVENOUS | Status: AC
  Filled 2020-09-02: qty 20

## 2020-09-02 MED ORDER — ENOXAPARIN SODIUM 40 MG/0.4ML IJ SOSY
40.0000 mg | PREFILLED_SYRINGE | Freq: Once | INTRAMUSCULAR | Status: AC
  Administered 2020-09-02: 40 mg via SUBCUTANEOUS

## 2020-09-02 MED ORDER — ALVIMOPAN 12 MG PO CAPS
12.0000 mg | ORAL_CAPSULE | Freq: Two times a day (BID) | ORAL | Status: DC
  Administered 2020-09-03: 12 mg via ORAL
  Filled 2020-09-02 (×2): qty 1

## 2020-09-02 MED ORDER — ENOXAPARIN SODIUM 40 MG/0.4ML IJ SOSY
PREFILLED_SYRINGE | INTRAMUSCULAR | Status: AC
  Filled 2020-09-02: qty 0.4

## 2020-09-02 MED ORDER — POVIDONE-IODINE 10 % EX OINT
TOPICAL_OINTMENT | CUTANEOUS | Status: AC
  Filled 2020-09-02: qty 2

## 2020-09-02 MED ORDER — ACETAMINOPHEN 325 MG PO TABS: 650.0000 mg | ORAL_TABLET | Freq: Four times a day (QID) | ORAL | Status: DC | PRN

## 2020-09-02 MED ORDER — FENTANYL CITRATE (PF) 250 MCG/5ML IJ SOLN
INTRAMUSCULAR | Status: AC
  Filled 2020-09-02: qty 5

## 2020-09-02 MED ORDER — DEXAMETHASONE SODIUM PHOSPHATE 10 MG/ML IJ SOLN
INTRAMUSCULAR | Status: AC
  Filled 2020-09-02: qty 1

## 2020-09-02 MED ORDER — LORAZEPAM 2 MG/ML IJ SOLN
1.0000 mg | INTRAMUSCULAR | Status: DC | PRN
  Administered 2020-09-03: 1 mg via INTRAVENOUS
  Filled 2020-09-02: qty 1

## 2020-09-02 MED ORDER — LACTATED RINGERS IV SOLN
INTRAVENOUS | Status: DC
  Administered 2020-09-02: 1000 mL via INTRAVENOUS

## 2020-09-02 MED ORDER — MIDAZOLAM HCL 2 MG/2ML IJ SOLN
INTRAMUSCULAR | Status: AC
  Filled 2020-09-02: qty 2

## 2020-09-02 MED ORDER — CHLORHEXIDINE GLUCONATE 0.12 % MT SOLN
15.0000 mL | Freq: Once | OROMUCOSAL | Status: AC
  Administered 2020-09-02: 15 mL via OROMUCOSAL

## 2020-09-02 MED ORDER — DEXAMETHASONE SODIUM PHOSPHATE 10 MG/ML IJ SOLN
INTRAMUSCULAR | Status: DC | PRN
  Administered 2020-09-02: 10 mg via INTRAVENOUS

## 2020-09-02 MED ORDER — ESMOLOL HCL 100 MG/10ML IV SOLN
INTRAVENOUS | Status: DC | PRN
  Administered 2020-09-02: 10 mg via INTRAVENOUS

## 2020-09-02 MED ORDER — SUGAMMADEX SODIUM 200 MG/2ML IV SOLN
INTRAVENOUS | Status: DC | PRN
  Administered 2020-09-02: 200 mg via INTRAVENOUS

## 2020-09-02 MED ORDER — KETAMINE HCL 10 MG/ML IJ SOLN
INTRAMUSCULAR | Status: DC | PRN
  Administered 2020-09-02: 30 mg via INTRAVENOUS
  Administered 2020-09-02: 20 mg via INTRAVENOUS

## 2020-09-02 MED ORDER — FENTANYL CITRATE (PF) 100 MCG/2ML IJ SOLN
25.0000 ug | INTRAMUSCULAR | Status: DC | PRN
  Administered 2020-09-02 (×3): 50 ug via INTRAVENOUS
  Filled 2020-09-02 (×2): qty 2

## 2020-09-02 MED ORDER — BUPIVACAINE HCL (300 MG DOSE) 3 X 100 MG IL IMPL
DRUG_IMPLANT | Status: DC | PRN
  Administered 2020-09-02: 300 mg

## 2020-09-02 MED ORDER — MIDAZOLAM HCL 5 MG/5ML IJ SOLN
INTRAMUSCULAR | Status: DC | PRN
  Administered 2020-09-02: 2 mg via INTRAVENOUS

## 2020-09-02 MED ORDER — ONDANSETRON HCL 4 MG/2ML IJ SOLN
4.0000 mg | Freq: Four times a day (QID) | INTRAMUSCULAR | Status: DC | PRN
  Administered 2020-09-03: 4 mg via INTRAVENOUS
  Filled 2020-09-02: qty 2

## 2020-09-02 MED ORDER — HEMOSTATIC AGENTS (NO CHARGE) OPTIME
TOPICAL | Status: DC | PRN
  Administered 2020-09-02 (×2): 1 via TOPICAL

## 2020-09-02 MED ORDER — ROCURONIUM BROMIDE 100 MG/10ML IV SOLN
INTRAVENOUS | Status: DC | PRN
  Administered 2020-09-02: 50 mg via INTRAVENOUS
  Administered 2020-09-02 (×2): 20 mg via INTRAVENOUS

## 2020-09-02 MED ORDER — ONDANSETRON HCL 4 MG/2ML IJ SOLN
INTRAMUSCULAR | Status: DC | PRN
Start: 2020-09-02 — End: 2020-09-02
  Administered 2020-09-02: 4 mg via INTRAVENOUS

## 2020-09-02 MED ORDER — DIPHENHYDRAMINE HCL 25 MG PO CAPS: 25.0000 mg | ORAL_CAPSULE | Freq: Four times a day (QID) | ORAL | Status: DC | PRN

## 2020-09-02 MED ORDER — SIMETHICONE 80 MG PO CHEW
40.0000 mg | CHEWABLE_TABLET | Freq: Four times a day (QID) | ORAL | Status: DC | PRN
  Filled 2020-09-02: qty 1

## 2020-09-02 MED ORDER — LORATADINE 10 MG PO TABS
10.0000 mg | ORAL_TABLET | Freq: Every day | ORAL | Status: DC
  Administered 2020-09-03 – 2020-09-04 (×2): 10 mg via ORAL
  Filled 2020-09-02 (×2): qty 1

## 2020-09-02 MED ORDER — KETOROLAC TROMETHAMINE 30 MG/ML IJ SOLN
30.0000 mg | Freq: Four times a day (QID) | INTRAMUSCULAR | Status: AC
  Administered 2020-09-02: 30 mg via INTRAVENOUS
  Filled 2020-09-02: qty 1

## 2020-09-02 MED ORDER — ROCURONIUM BROMIDE 10 MG/ML (PF) SYRINGE
PREFILLED_SYRINGE | INTRAVENOUS | Status: AC
  Filled 2020-09-02: qty 10

## 2020-09-02 MED ORDER — OXYCODONE-ACETAMINOPHEN 5-325 MG PO TABS
1.0000 | ORAL_TABLET | ORAL | Status: DC | PRN
  Administered 2020-09-03 – 2020-09-04 (×2): 2 via ORAL
  Filled 2020-09-02 (×2): qty 2

## 2020-09-02 MED ORDER — ACETAMINOPHEN 650 MG RE SUPP
650.0000 mg | Freq: Four times a day (QID) | RECTAL | Status: DC | PRN
  Filled 2020-09-02: qty 1

## 2020-09-02 MED ORDER — DIPHENHYDRAMINE HCL 50 MG/ML IJ SOLN: 25.0000 mg | Freq: Four times a day (QID) | INTRAMUSCULAR | Status: DC | PRN

## 2020-09-02 MED ORDER — PROPOFOL 10 MG/ML IV BOLUS
INTRAVENOUS | Status: DC | PRN
  Administered 2020-09-02: 200 mg via INTRAVENOUS

## 2020-09-02 MED ORDER — KETAMINE HCL 50 MG/5ML IJ SOSY
PREFILLED_SYRINGE | INTRAMUSCULAR | Status: AC
  Filled 2020-09-02: qty 5

## 2020-09-02 MED ORDER — POVIDONE-IODINE 10 % OINT PACKET
TOPICAL_OINTMENT | CUTANEOUS | Status: DC | PRN
  Administered 2020-09-02 (×2): 1 via TOPICAL

## 2020-09-02 MED ORDER — KETOROLAC TROMETHAMINE 30 MG/ML IJ SOLN
30.0000 mg | Freq: Four times a day (QID) | INTRAMUSCULAR | Status: DC | PRN
  Administered 2020-09-03: 30 mg via INTRAVENOUS
  Filled 2020-09-02: qty 1

## 2020-09-02 MED ORDER — HYDROMORPHONE HCL 1 MG/ML IJ SOLN
1.0000 mg | INTRAMUSCULAR | Status: DC | PRN
  Administered 2020-09-02 – 2020-09-03 (×4): 1 mg via INTRAVENOUS
  Filled 2020-09-02 (×4): qty 1

## 2020-09-02 MED ORDER — ALVIMOPAN 12 MG PO CAPS
12.0000 mg | ORAL_CAPSULE | ORAL | Status: AC
  Administered 2020-09-02: 12 mg via ORAL

## 2020-09-02 MED ORDER — FENTANYL CITRATE (PF) 100 MCG/2ML IJ SOLN
INTRAMUSCULAR | Status: DC | PRN
  Administered 2020-09-02: 50 ug via INTRAVENOUS
  Administered 2020-09-02: 100 ug via INTRAVENOUS
  Administered 2020-09-02: 50 ug via INTRAVENOUS
  Administered 2020-09-02 (×2): 100 ug via INTRAVENOUS
  Administered 2020-09-02: 150 ug via INTRAVENOUS
  Administered 2020-09-02: 50 ug via INTRAVENOUS

## 2020-09-02 MED ORDER — ONDANSETRON HCL 4 MG/2ML IJ SOLN: 4.0000 mg | Freq: Once | INTRAMUSCULAR | Status: DC | PRN

## 2020-09-02 MED ORDER — ALVIMOPAN 12 MG PO CAPS
ORAL_CAPSULE | ORAL | Status: AC
  Filled 2020-09-02: qty 1

## 2020-09-02 MED ORDER — 0.9 % SODIUM CHLORIDE (POUR BTL) OPTIME
TOPICAL | Status: DC | PRN
  Administered 2020-09-02: 2000 mL

## 2020-09-02 MED ORDER — ENOXAPARIN SODIUM 40 MG/0.4ML IJ SOSY
40.0000 mg | PREFILLED_SYRINGE | INTRAMUSCULAR | Status: DC
  Administered 2020-09-03 – 2020-09-04 (×2): 40 mg via SUBCUTANEOUS
  Filled 2020-09-02 (×2): qty 0.4

## 2020-09-02 MED ORDER — FENTANYL CITRATE (PF) 100 MCG/2ML IJ SOLN
INTRAMUSCULAR | Status: AC
  Filled 2020-09-02: qty 2

## 2020-09-02 MED ORDER — GABAPENTIN 300 MG PO CAPS
600.0000 mg | ORAL_CAPSULE | Freq: Every day | ORAL | Status: DC
  Administered 2020-09-02 – 2020-09-03 (×2): 600 mg via ORAL
  Filled 2020-09-02 (×2): qty 2

## 2020-09-02 MED ORDER — ORAL CARE MOUTH RINSE: 15.0000 mL | Freq: Once | OROMUCOSAL | Status: AC

## 2020-09-02 SURGICAL SUPPLY — 40 items
COVER LIGHT HANDLE STERIS (MISCELLANEOUS) ×8 IMPLANT
DRSG OPSITE POSTOP 4X10 (GAUZE/BANDAGES/DRESSINGS) ×1 IMPLANT
DRSG OPSITE POSTOP 4X8 (GAUZE/BANDAGES/DRESSINGS) ×1 IMPLANT
ELECT REM PT RETURN 9FT ADLT (ELECTROSURGICAL) ×2
ELECTRODE REM PT RTRN 9FT ADLT (ELECTROSURGICAL) ×1 IMPLANT
GAUZE 4X4 16PLY ~~LOC~~+RFID DBL (SPONGE) ×1 IMPLANT
GLOVE SURG ENC MOIS LTX SZ6.5 (GLOVE) ×4 IMPLANT
GLOVE SURG SS PI 7.5 STRL IVOR (GLOVE) ×4 IMPLANT
GLOVE SURG UNDER POLY LF SZ6.5 (GLOVE) ×4 IMPLANT
GLOVE SURG UNDER POLY LF SZ7 (GLOVE) ×10 IMPLANT
GOWN STRL REUS W/TWL LRG LVL3 (GOWN DISPOSABLE) ×12 IMPLANT
HEMOSTAT SURGICEL 4X8 (HEMOSTASIS) ×1 IMPLANT
INST SET MAJOR GENERAL (KITS) ×2 IMPLANT
KIT TURNOVER KIT A (KITS) ×2 IMPLANT
LIGASURE IMPACT 36 18CM CVD LR (INSTRUMENTS) ×2 IMPLANT
MANIFOLD NEPTUNE II (INSTRUMENTS) ×2 IMPLANT
NDL HYPO 18GX1.5 BLUNT FILL (NEEDLE) ×1 IMPLANT
NEEDLE HYPO 18GX1.5 BLUNT FILL (NEEDLE) ×2 IMPLANT
NS IRRIG 1000ML POUR BTL (IV SOLUTION) ×4 IMPLANT
PACK COLON (CUSTOM PROCEDURE TRAY) ×2 IMPLANT
PAD ARMBOARD 7.5X6 YLW CONV (MISCELLANEOUS) ×2 IMPLANT
PENCIL HANDSWITCHING (ELECTRODE) ×1 IMPLANT
PENCIL SMOKE EVACUATOR COATED (MISCELLANEOUS) ×2 IMPLANT
RELOAD PROXIMATE 75MM BLUE (ENDOMECHANICALS) ×4 IMPLANT
RELOAD STAPLE 75 3.8 BLU REG (ENDOMECHANICALS) IMPLANT
RETRACTOR WND ALEXIS-O 25 LRG (MISCELLANEOUS) IMPLANT
RTRCTR WOUND ALEXIS O 25CM LRG (MISCELLANEOUS) ×2
SPONGE SURGIFOAM ABS GEL 100 (HEMOSTASIS) ×1 IMPLANT
SPONGE T-LAP 18X18 ~~LOC~~+RFID (SPONGE) ×5 IMPLANT
STAPLER GUN LINEAR PROX 60 (STAPLE) ×2 IMPLANT
STAPLER PROXIMATE 75MM BLUE (STAPLE) ×1 IMPLANT
STAPLER VISISTAT (STAPLE) ×2 IMPLANT
SUT CHROMIC 0 SH (SUTURE) IMPLANT
SUT CHROMIC 2 0 SH (SUTURE) ×1 IMPLANT
SUT PDS AB 0 CTX 60 (SUTURE) ×2 IMPLANT
SUT SILK 2 0 (SUTURE)
SUT SILK 2-0 18XBRD TIE 12 (SUTURE) IMPLANT
SUT SILK 3 0 SH CR/8 (SUTURE) ×3 IMPLANT
TRAY FOLEY MTR SLVR 16FR STAT (SET/KITS/TRAYS/PACK) ×2 IMPLANT
YANKAUER SUCT BULB TIP 10FT TU (MISCELLANEOUS) ×2 IMPLANT

## 2020-09-02 NOTE — Anesthesia Preprocedure Evaluation (Signed)
Anesthesia Evaluation  Patient identified by MRN, date of birth, ID band Patient awake    Reviewed: Allergy & Precautions, H&P , NPO status , Patient's Chart, lab work & pertinent test results, reviewed documented beta blocker date and time   Airway Mallampati: II  TM Distance: >3 FB Neck ROM: full    Dental no notable dental hx.    Pulmonary neg pulmonary ROS, Current Smoker and Patient abstained from smoking.,    Pulmonary exam normal breath sounds clear to auscultation       Cardiovascular Exercise Tolerance: Good + CAD   Rhythm:regular Rate:Normal     Neuro/Psych negative neurological ROS  negative psych ROS   GI/Hepatic Neg liver ROS, GERD  Medicated,  Endo/Other  negative endocrine ROS  Renal/GU negative Renal ROS  negative genitourinary   Musculoskeletal   Abdominal   Peds  Hematology negative hematology ROS (+)   Anesthesia Other Findings   Reproductive/Obstetrics negative OB ROS                             Anesthesia Physical Anesthesia Plan  ASA: 3  Anesthesia Plan: General and General ETT   Post-op Pain Management:    Induction:   PONV Risk Score and Plan: Ondansetron  Airway Management Planned:   Additional Equipment:   Intra-op Plan:   Post-operative Plan:   Informed Consent: I have reviewed the patients History and Physical, chart, labs and discussed the procedure including the risks, benefits and alternatives for the proposed anesthesia with the patient or authorized representative who has indicated his/her understanding and acceptance.     Dental Advisory Given  Plan Discussed with: CRNA  Anesthesia Plan Comments:         Anesthesia Quick Evaluation

## 2020-09-02 NOTE — Anesthesia Procedure Notes (Signed)
Procedure Name: Intubation Date/Time: 09/02/2020 11:53 AM Performed by: Riki Sheer, CRNA Pre-anesthesia Checklist: Patient identified, Emergency Drugs available, Suction available, Patient being monitored and Timeout performed Patient Re-evaluated:Patient Re-evaluated prior to induction Oxygen Delivery Method: Circle system utilized Preoxygenation: Pre-oxygenation with 100% oxygen Induction Type: IV induction Ventilation: Mask ventilation without difficulty Laryngoscope Size: Glidescope and 4 Grade View: Grade III Tube size: 7.5 mm Number of attempts: 3 Airway Equipment and Method: Video-laryngoscopy Placement Confirmation: ETT inserted through vocal cords under direct vision, positive ETCO2, CO2 detector and breath sounds checked- equal and bilateral Secured at: 25 cm Tube secured with: Tape Dental Injury: Bloody posterior oropharynx  Difficulty Due To: Difficulty was unanticipated, Difficult Airway- due to anterior larynx and Difficult Airway- due to immobile epiglottis Future Recommendations: Recommend- induction with short-acting agent, and alternative techniques readily available Comments: Pt was unexpected difficult airway. DL #1 with Sabra Heck 2 and only able to view epiglottis, DL #2 with Glide Mac 4, Grade 3 view, but was unable to a pass ETT due to immobile epiglottis and large amount of redundant tissue. DL #3 with Glide Mac 4, Finally got a brief Grade 2 view and pass ETT through cords. Scant amount of blood in Oropharynx upon removing Glide. I was able to easily Mask patient in between each attempt at obtaiing a secure airway. I recommend short acting muscle relaxant and Glide for any further intubations. Baldwin Crown, CRNA

## 2020-09-02 NOTE — Op Note (Signed)
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  Risks and benefits of the procedure including bleeding, infection, cardiopulmonary difficulties, anastomotic leak, and the possibility of a blood transfusion were fully explained to the patient, who gave informed consent.  Procedure note: The patient was placed in the supine position.  After induction of general endotracheal anesthesia, the abdomen was prepped and draped using the usual sterile technique with ChloraPrep.  Surgical site confirmation was performed.  Midline incision was made from just above the umbilicus to below the umbilicus.  The peritoneal cavity was entered into without difficulty.  The liver was palpated and noted to be within normal limits.  There was a significant mount of adhesions of small bowel as well as omentum in the right lower quadrant due to a previous open appendectomy.  These were taken down sharply and also with the LigaSure.  The right colon was then mobilized along the peritoneal reflection.  The hepatic flexure was taken down using the LigaSure.  Care was taken to avoid the duodenum.  Once the right colon was mobilized, a GIA stapler was placed across the proximal transverse colon and fired.  This was likewise done across the terminal ileum.  The mesentery was then divided using the LigaSure.  The specimen was then removed from the operative field.  A side to side ileocolic anastomosis was performed using a GIA 75 stapler.  The colotomy was closed using a TA 60 stapler.  The staple line was bolstered using 3-0 silk Lembert sutures.  Greater omentum was then placed around the anastomosis and secured in place using 3-0  silk suture.  A single suture was placed in the colon mesentery to reapproximated.  The right paracolic gutter was inspected and no abnormal bleeding was noted.  The right ureter was noted to be in the retroperitoneal space.  The duodenum was within normal limits.  Surgicel and Gelfoam packing was then placed along the right paracolic gutter.  The bowel was returned into the abdominal cavity in an orderly fashion.  All operating personnel then changed their gown and gloves.  A new set up was used.  The fascia was reapproximated using a looped 0 PDS running suture.  Xaracoll strips were placed over the fascia.  Subcutaneous layer was irrigated with normal saline.  The skin was closed using staples.  Betadine ointment and dry sterile dressings were applied.  All tape and needle counts were correct at the end of the procedure.  The patient was extubated in the operating room and transferred to PACU in stable condition.  Complications: None  EBL: 300 cc  Specimen: Right colon

## 2020-09-02 NOTE — Transfer of Care (Signed)
Immediate Anesthesia Transfer of Care Note  Patient: Derrick Turner  Procedure(s) Performed: HEMICOLECTOMY (Right: Abdomen)  Patient Location: PACU  Anesthesia Type:General  Level of Consciousness: awake and drowsy  Airway & Oxygen Therapy: Patient Spontanous Breathing and Patient connected to nasal cannula oxygen  Post-op Assessment: Report given to RN and Post -op Vital signs reviewed and stable  Post vital signs: Reviewed and stable  Last Vitals:  Vitals Value Taken Time  BP 153/80 09/02/20 1419  Temp    Pulse 87 09/02/20 1420  Resp 25 09/02/20 1420  SpO2 89 % 09/02/20 1420  Vitals shown include unvalidated device data.  Last Pain:  Vitals:   09/02/20 0850  TempSrc: Oral  PainSc: 0-No pain      Patients Stated Pain Goal: 7 ([id number])  Complications: No notable events documented.

## 2020-09-02 NOTE — Anesthesia Postprocedure Evaluation (Signed)
Anesthesia Post Note  Patient: Derrick Turner  Procedure(s) Performed: HEMICOLECTOMY (Right: Abdomen)  Patient location during evaluation: Phase II Anesthesia Type: General Level of consciousness: awake Pain management: pain level controlled Vital Signs Assessment: post-procedure vital signs reviewed and stable Respiratory status: spontaneous breathing and respiratory function stable Cardiovascular status: blood pressure returned to baseline and stable Postop Assessment: no headache and no apparent nausea or vomiting Anesthetic complications: no Comments: Late entry   No notable events documented.   Last Vitals:  Vitals:   09/02/20 1600 09/02/20 1629  BP: (!) 155/77 (!) 170/83  Pulse: 87 84  Resp: 14 18  Temp:  36.8 C  SpO2: 94% 95%    Last Pain:  Vitals:   09/02/20 1629  TempSrc: Oral  PainSc:                  Louann Sjogren

## 2020-09-02 NOTE — Interval H&P Note (Signed)
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  The various methods of treatment have been discussed with the patient and family. After consideration of risks, benefits and other options for treatment, the patient has consented to  Procedure(s): PARTIAL COLECTOMY (N/A) as a surgical intervention.  The patient's history has been reviewed, patient examined, no change in status, stable for surgery.  I have reviewed the patient's chart and labs.  Questions were answered to the patient's satisfaction.     Aviva Signs

## 2020-09-03 ENCOUNTER — Encounter (HOSPITAL_COMMUNITY): Payer: Self-pay | Admitting: General Surgery

## 2020-09-03 LAB — GLUCOSE, CAPILLARY: Glucose-Capillary: 152 mg/dL — ABNORMAL HIGH (ref 70–99)

## 2020-09-03 LAB — BASIC METABOLIC PANEL
Anion gap: 8 (ref 5–15)
BUN: 17 mg/dL (ref 6–20)
CO2: 26 mmol/L (ref 22–32)
Calcium: 8.2 mg/dL — ABNORMAL LOW (ref 8.9–10.3)
Chloride: 101 mmol/L (ref 98–111)
Creatinine, Ser: 0.51 mg/dL — ABNORMAL LOW (ref 0.61–1.24)
GFR, Estimated: >60 mL/min (ref >60)
Glucose, Bld: 163 mg/dL — ABNORMAL HIGH (ref 70–99)
Potassium: 4.2 mmol/L (ref 3.5–5.1)
Sodium: 135 mmol/L (ref 135–145)

## 2020-09-03 LAB — CBC
HCT: 43.4 % (ref 39.0–52.0)
HCT: 40.4 % (ref 39.0–52.0)
Hemoglobin: 14.8 g/dL (ref 13.0–17.0)
Hemoglobin: 13.8 g/dL (ref 13.0–17.0)
MCH: 30.4 pg (ref 26.0–34.0)
MCH: 30.7 pg (ref 26.0–34.0)
MCHC: 34.1 g/dL (ref 30.0–36.0)
MCHC: 34.2 g/dL (ref 30.0–36.0)
MCV: 89.1 fL (ref 80.0–100.0)
MCV: 89.8 fL (ref 80.0–100.0)
Platelets: 156 10*3/uL (ref 150–400)
Platelets: 147 10*3/uL — ABNORMAL LOW (ref 150–400)
RBC: 4.87 MIL/uL (ref 4.22–5.81)
RBC: 4.5 MIL/uL (ref 4.22–5.81)
RDW: 13.1 % (ref 11.5–15.5)
RDW: 13.3 % (ref 11.5–15.5)
WBC: 15 10*3/uL — ABNORMAL HIGH (ref 4.0–10.5)
WBC: 13.9 10*3/uL — ABNORMAL HIGH (ref 4.0–10.5)
nRBC: 0 % (ref 0.0–0.2)
nRBC: 0 % (ref 0.0–0.2)

## 2020-09-03 LAB — PHOSPHORUS: Phosphorus: 2.9 mg/dL (ref 2.5–4.6)

## 2020-09-03 LAB — MAGNESIUM: Magnesium: 1.7 mg/dL (ref 1.7–2.4)

## 2020-09-03 MED ORDER — ALBUTEROL SULFATE (2.5 MG/3ML) 0.083% IN NEBU
2.5000 mg | INHALATION_SOLUTION | RESPIRATORY_TRACT | Status: DC | PRN
  Administered 2020-09-03: 2.5 mg via RESPIRATORY_TRACT
  Filled 2020-09-03: qty 3

## 2020-09-03 MED ORDER — NICOTINE 21 MG/24HR TD PT24
21.0000 mg | MEDICATED_PATCH | Freq: Every day | TRANSDERMAL | Status: DC
  Administered 2020-09-03 – 2020-09-04 (×2): 21 mg via TRANSDERMAL
  Filled 2020-09-03 (×2): qty 1

## 2020-09-03 NOTE — Progress Notes (Signed)
Patient c/o having " hot flashes " , checked Temp oral is 98.5 . Notified Dr. Arnoldo Morale, patient able to ambulate in hall earlier with minimal difficulty

## 2020-09-03 NOTE — Progress Notes (Signed)
1 Day Post-Op  Subjective: Patient having incisional pain.  He has voided without difficulty.  He is passing gas.  He does have a cough with a history of smoking.  Objective: Vital signs in last 24 hours: Temp:  [97.9 F (36.6 C)-98.2 F (36.8 C)] 98.2 F (36.8 C) (07/28 0407) Pulse Rate:  [70-93] 70 (07/28 0407) Resp:  [12-22] 20 (07/28 0407) BP: (152-172)/(77-99) 152/86 (07/28 0407) SpO2:  [93 %-99 %] 99 % (07/28 0407)    Intake/Output from previous day: 07/27 0701 - 07/28 0700 In: 1800 [I.V.:1800] Out: 875 [Urine:575; Blood:300] Intake/Output this shift: Total I/O In: -  Out: 400 [Urine:400]  General appearance: alert, cooperative, and no distress Resp: wheezes bilaterally Cardio: regular rate and rhythm, S1, S2 normal, no murmur, click, rub or gallop GI: Soft with minimal bowel sounds appreciated.  Incision healing well with minimal blood on the dressing.  Lab Results:  Recent Labs    08/31/20 1522 09/03/20 0624  WBC 9.1 15.0*  HGB 15.5 14.8  HCT 46.2 43.4  PLT 151 156   BMET Recent Labs    08/31/20 1522 09/03/20 0624  NA 135 135  K 3.4* 4.2  CL 103 101  CO2 25 26  GLUCOSE 142* 163*  BUN 20 17  CREATININE 0.61 0.51*  CALCIUM 8.8* 8.2*   PT/INR No results for input(s): LABPROT, INR in the last 72 hours.  Studies/Results: No results found.  Anti-infectives: Anti-infectives (From admission, onward)    Start     Dose/Rate Route Frequency Ordered Stop   09/02/20 0845  ertapenem (INVANZ) 1,000 mg in sodium chloride 0.9 % 100 mL IVPB        1 g 200 mL/hr over 30 Minutes Intravenous On call to O.R. 09/02/20 0835 09/02/20 1200       Assessment/Plan: s/p Procedure(s): HEMICOLECTOMY Impression: Stable on postoperative day 1.  We will advance to full liquid diet.  We will encourage ambulation.  We will start albuterol inhalers and a NicoDerm CQ patch.  LOS: 1 day    Aviva Signs 09/03/2020

## 2020-09-03 NOTE — Progress Notes (Signed)
Patient noted to have tremors and beads of sweat on forehead. States he drinks occasionally "/ Notified Dr. Arnoldo Morale and given PRN ativan , toradol and zofran per patient request

## 2020-09-04 LAB — BASIC METABOLIC PANEL
Anion gap: 7 (ref 5–15)
BUN: 13 mg/dL (ref 6–20)
CO2: 30 mmol/L (ref 22–32)
Calcium: 8.3 mg/dL — ABNORMAL LOW (ref 8.9–10.3)
Chloride: 100 mmol/L (ref 98–111)
Creatinine, Ser: 0.58 mg/dL — ABNORMAL LOW (ref 0.61–1.24)
GFR, Estimated: >60 mL/min (ref >60)
Glucose, Bld: 112 mg/dL — ABNORMAL HIGH (ref 70–99)
Potassium: 4 mmol/L (ref 3.5–5.1)
Sodium: 137 mmol/L (ref 135–145)

## 2020-09-04 LAB — PHOSPHORUS: Phosphorus: 1.9 mg/dL — ABNORMAL LOW (ref 2.5–4.6)

## 2020-09-04 LAB — SURGICAL PATHOLOGY

## 2020-09-04 LAB — MAGNESIUM: Magnesium: 1.8 mg/dL (ref 1.7–2.4)

## 2020-09-04 MED ORDER — OXYCODONE-ACETAMINOPHEN 5-325 MG PO TABS: 1.0000 | ORAL_TABLET | ORAL | 0 refills | Status: DC | PRN

## 2020-09-04 NOTE — Discharge Summary (Signed)
Physician Discharge Summary  Patient ID: Derrick Turner MRN: [id number] DOB/AGE: [birth year] 50 y.o.  Admit date: 09/02/2020 Discharge date: 09/04/2020  Admission Diagnoses: High-grade dysplasia, ascending colon  Discharge Diagnoses: Same Active Problems:   Dysplastic colon polyp   S/P partial colectomy   Discharged Condition: good  Hospital Course: Patient is a 50 year old white male who was found on endoscopy to have a high-grade dysplastic lesion at the ileocecal valve.  The patient underwent a right hemicolectomy on 09/02/2020.  He tolerated the surgery well.  His postoperative course was unremarkable.  His diet was advanced out difficulty.  Final pathology is still pending.  His bowel function has returned.  The patient is being discharged home on 09/04/2020 in good and improving condition.   Treatments: surgery: Right hemicolectomy on 09/02/2020  Discharge Exam: Blood pressure (!) 144/74, pulse 82, temperature 99.1 F (37.3 C), temperature source Oral, resp. rate 20, SpO2 90 %. General appearance: alert, cooperative, and no distress Resp: clear to auscultation bilaterally Cardio: regular rate and rhythm, S1, S2 normal, no murmur, click, rub or gallop GI: Soft, incision healing well.  Disposition: Discharge disposition: 01-Home or Self Care       Discharge Instructions     Diet - low sodium heart healthy   Complete by: As directed    Increase activity slowly   Complete by: As directed       Allergies as of 09/04/2020   No Known Allergies      Medication List     STOP taking these medications    aspirin 81 MG EC tablet       TAKE these medications    atorvastatin 40 MG tablet Commonly known as: LIPITOR TAKE 1 TABLET BY MOUTH EVERY DAY What changed: when to take this   cetirizine 10 MG tablet Commonly known as: ZYRTEC Take 10 mg by mouth in the morning.   docusate sodium 100 MG capsule Commonly known as: COLACE Take 100 mg by mouth at  bedtime.   gabapentin 300 MG capsule Commonly known as: NEURONTIN Take 600 mg by mouth at bedtime.   ibuprofen 200 MG tablet Commonly known as: ADVIL Take 800 mg by mouth in the morning and at bedtime.   Melatonin 10 MG Tabs Take 20 mg by mouth at bedtime.   metroNIDAZOLE 500 MG tablet Commonly known as: Flagyl Take 1 tablet (500 mg total) by mouth 3 (three) times daily. Take one tablet by mouth at 1pm, 2pm, and 9pm the day before surgery   neomycin 500 MG tablet Commonly known as: MYCIFRADIN Take 2 tablets (1,000 mg total) by mouth 3 (three) times daily. Take two tablets by mouth at 1pm, 2pm, and 9pm the day before surgery   ondansetron 4 MG tablet Commonly known as: Zofran Take 1 tablet (4 mg total) by mouth every 6 (six) hours as needed for nausea or vomiting.   oxyCODONE-acetaminophen 5-325 MG tablet Commonly known as: Percocet Take 1 tablet by mouth every 4 (four) hours as needed for severe pain.   PROBIOTIC PO Take 1 capsule by mouth in the morning.   Sutab [id number] MG Tabs Generic drug: Sodium Sulfate-Mag Sulfate-KCl USE AS DIRECTED        Follow-up Information     Aviva Signs, MD. Schedule an appointment as soon as possible for a visit on 09/10/2020.   Specialty: General Surgery Contact information: [id number] [phone]  Signed: Aviva Signs 09/04/2020, 9:54 AM

## 2020-09-04 NOTE — Progress Notes (Signed)
Discharge instructions reviewed with patient, wife at bedside. AVS given. Verbalized understanding of instructions,post-op care, medications and follow-up appointment. IV site removed by nursing, site within normal limits. No complaints at discharge. Left floor in stable condition via w/c accompanied by RN. Discharged home.

## 2020-09-10 ENCOUNTER — Encounter: Payer: Self-pay | Admitting: General Surgery

## 2020-09-10 ENCOUNTER — Ambulatory Visit (INDEPENDENT_AMBULATORY_CARE_PROVIDER_SITE_OTHER): Payer: BC Managed Care – PPO | Admitting: General Surgery

## 2020-09-10 ENCOUNTER — Other Ambulatory Visit: Payer: Self-pay

## 2020-09-10 VITALS — BP 129/83 | HR 75 | Temp 98.9°F | Resp 12 | Ht 70.0 in | Wt 204.0 lb

## 2020-09-10 DIAGNOSIS — Z09 Encounter for follow-up examination after completed treatment for conditions other than malignant neoplasm: Secondary | ICD-10-CM

## 2020-09-11 NOTE — Progress Notes (Signed)
Subjective:     [place]  Patient here for postoperative visit, status post right hemicolectomy.  He is doing well.  His appetite is starting return to normal.  His energy level is improving.  He is having normal bowel movements.  He denies any fever or chills. Objective:    BP 129/83   Pulse 75   Temp 98.9 F (37.2 C) (Other (Comment))   Resp 12   Ht '5\' 10"'$  (1.778 m)   Wt 204 lb (92.5 kg)   SpO2 96%   BMI 29.27 kg/m   General:  alert, cooperative, and no distress  Abdomen is soft, incision healing well.  Staples removed, Steri-Strips applied. Final pathology reveals no evidence of malignancy.  Patient aware.     Assessment:    Doing well postoperatively.    Plan:   May gradually return to normal activity.  Avoid any heavy lifting for the next few weeks.  Follow-up here as needed.

## 2020-09-17 ENCOUNTER — Ambulatory Visit (INDEPENDENT_AMBULATORY_CARE_PROVIDER_SITE_OTHER): Payer: BC Managed Care – PPO | Admitting: General Surgery

## 2020-09-17 ENCOUNTER — Other Ambulatory Visit: Payer: Self-pay

## 2020-09-17 ENCOUNTER — Encounter: Payer: Self-pay | Admitting: General Surgery

## 2020-09-17 VITALS — BP 159/88 | HR 75 | Temp 97.9°F | Resp 16 | Ht 70.0 in | Wt 201.8 lb

## 2020-09-17 DIAGNOSIS — Z09 Encounter for follow-up examination after completed treatment for conditions other than malignant neoplasm: Secondary | ICD-10-CM

## 2020-09-17 NOTE — Progress Notes (Signed)
Subjective:     Derrick Turner  Presents for me to check his incision as he had a small amount of bloody yellow drainage from the inferior aspect of the incision.  He denies any fevers.  He otherwise feels fine.  He has not noted purulent drainage. Objective:    BP (!) 159/88   Pulse 75   Temp 97.9 F (36.6 C) (Oral)   Resp 16   Ht '5\' 10"'$  (1.778 m)   Wt 201 lb 12.8 oz (91.5 kg)   SpO2 94%   BMI 28.96 kg/m   General:  alert, cooperative, and no distress  Abdomen is soft, incision with slight wound separation inferiorly.  No active drainage present.  No induration or significant erythema is noted.     Assessment:    Probable superficial hematoma that has drained.  Wound otherwise is doing well.    Plan:   Keep wound clean and dry with soap and water.  Follow-up here if the wound worsens.

## 2021-07-16 ENCOUNTER — Encounter (HOSPITAL_COMMUNITY)
Admission: EM | Disposition: A | Discharge status: Home / Self Care | Payer: Self-pay | Source: Other Acute Inpatient Hospital | Attending: Cardiovascular Disease

## 2021-07-16 ENCOUNTER — Inpatient Hospital Stay (HOSPITAL_COMMUNITY)
Admission: EM | Admit: 2021-07-16 | Discharge: 2021-07-17 | DRG: 247 | Disposition: A | Discharge status: Home / Self Care | Payer: BC Managed Care – PPO | Source: Other Acute Inpatient Hospital | Attending: Cardiovascular Disease | Admitting: Cardiovascular Disease

## 2021-07-16 DIAGNOSIS — I249 Acute ischemic heart disease, unspecified: Secondary | ICD-10-CM | POA: Diagnosis present

## 2021-07-16 DIAGNOSIS — E785 Hyperlipidemia, unspecified: Secondary | ICD-10-CM | POA: Diagnosis present

## 2021-07-16 DIAGNOSIS — Z955 Presence of coronary angioplasty implant and graft: Secondary | ICD-10-CM

## 2021-07-16 DIAGNOSIS — F17210 Nicotine dependence, cigarettes, uncomplicated: Secondary | ICD-10-CM | POA: Diagnosis present

## 2021-07-16 DIAGNOSIS — Z9861 Coronary angioplasty status: Secondary | ICD-10-CM

## 2021-07-16 DIAGNOSIS — I2119 ST elevation (STEMI) myocardial infarction involving other coronary artery of inferior wall: Principal | ICD-10-CM | POA: Diagnosis present

## 2021-07-16 DIAGNOSIS — Z9049 Acquired absence of other specified parts of digestive tract: Secondary | ICD-10-CM | POA: Diagnosis not present

## 2021-07-16 DIAGNOSIS — R001 Bradycardia, unspecified: Secondary | ICD-10-CM | POA: Diagnosis present

## 2021-07-16 DIAGNOSIS — I2510 Atherosclerotic heart disease of native coronary artery without angina pectoris: Secondary | ICD-10-CM | POA: Diagnosis present

## 2021-07-16 DIAGNOSIS — Z79899 Other long term (current) drug therapy: Secondary | ICD-10-CM | POA: Diagnosis not present

## 2021-07-16 DIAGNOSIS — I2102 ST elevation (STEMI) myocardial infarction involving left anterior descending coronary artery: Secondary | ICD-10-CM

## 2021-07-16 DIAGNOSIS — Z720 Tobacco use: Secondary | ICD-10-CM | POA: Diagnosis present

## 2021-07-16 DIAGNOSIS — Z8249 Family history of ischemic heart disease and other diseases of the circulatory system: Secondary | ICD-10-CM

## 2021-07-16 HISTORY — PX: LEFT HEART CATH AND CORONARY ANGIOGRAPHY: CATH118249

## 2021-07-16 HISTORY — PX: CORONARY/GRAFT ACUTE MI REVASCULARIZATION: CATH118305

## 2021-07-16 LAB — MRSA NEXT GEN BY PCR, NASAL: MRSA by PCR Next Gen: NOT DETECTED

## 2021-07-16 LAB — TROPONIN I (HIGH SENSITIVITY)
Troponin I (High Sensitivity): 1603 ng/L (ref <18)
Troponin I (High Sensitivity): 5487 ng/L (ref <18)

## 2021-07-16 LAB — POCT ACTIVATED CLOTTING TIME: Activated Clotting Time: 245 seconds

## 2021-07-16 SURGERY — LEFT HEART CATH AND CORONARY ANGIOGRAPHY
Anesthesia: LOCAL

## 2021-07-16 MED ORDER — MIDAZOLAM HCL 2 MG/2ML IJ SOLN
INTRAMUSCULAR | Status: DC | PRN
  Administered 2021-07-16 (×2): 1 mg via INTRAVENOUS

## 2021-07-16 MED ORDER — FENTANYL CITRATE (PF) 100 MCG/2ML IJ SOLN
INTRAMUSCULAR | Status: DC | PRN
  Administered 2021-07-16: 25 ug via INTRAVENOUS
  Administered 2021-07-16: 50 ug via INTRAVENOUS

## 2021-07-16 MED ORDER — HYDRALAZINE HCL 20 MG/ML IJ SOLN: 10.0000 mg | INTRAMUSCULAR | Status: AC | PRN

## 2021-07-16 MED ORDER — HEPARIN (PORCINE) IN NACL 1000-0.9 UT/500ML-% IV SOLN
INTRAVENOUS | Status: DC | PRN
  Administered 2021-07-16 (×2): 500 mL

## 2021-07-16 MED ORDER — HEPARIN (PORCINE) IN NACL 1000-0.9 UT/500ML-% IV SOLN
INTRAVENOUS | Status: AC
  Filled 2021-07-16: qty 1000

## 2021-07-16 MED ORDER — HEPARIN SODIUM (PORCINE) 1000 UNIT/ML IJ SOLN
INTRAMUSCULAR | Status: AC
  Filled 2021-07-16: qty 10

## 2021-07-16 MED ORDER — FENTANYL CITRATE (PF) 100 MCG/2ML IJ SOLN
INTRAMUSCULAR | Status: AC
  Filled 2021-07-16: qty 2

## 2021-07-16 MED ORDER — GABAPENTIN 300 MG PO CAPS
600.0000 mg | ORAL_CAPSULE | Freq: Every day | ORAL | Status: DC
  Administered 2021-07-16: 600 mg via ORAL
  Filled 2021-07-16: qty 2

## 2021-07-16 MED ORDER — NITROGLYCERIN 1 MG/10 ML FOR IR/CATH LAB
INTRA_ARTERIAL | Status: AC
  Filled 2021-07-16: qty 10

## 2021-07-16 MED ORDER — TIROFIBAN HCL IN NACL 5-0.9 MG/100ML-% IV SOLN: 0.1500 ug/kg/min | INTRAVENOUS | Status: AC

## 2021-07-16 MED ORDER — VERAPAMIL HCL 2.5 MG/ML IV SOLN
INTRAVENOUS | Status: AC
  Filled 2021-07-16: qty 2

## 2021-07-16 MED ORDER — TIROFIBAN HCL IN NACL 5-0.9 MG/100ML-% IV SOLN
INTRAVENOUS | Status: AC | PRN
  Administered 2021-07-16: .15 ug/kg/min via INTRAVENOUS

## 2021-07-16 MED ORDER — ATORVASTATIN CALCIUM 80 MG PO TABS
80.0000 mg | ORAL_TABLET | Freq: Every day | ORAL | Status: DC
Start: 2021-07-16 — End: 2021-07-17
  Administered 2021-07-16 – 2021-07-17 (×2): 80 mg via ORAL
  Filled 2021-07-16 (×2): qty 1

## 2021-07-16 MED ORDER — SODIUM CHLORIDE 0.9 % IV SOLN: INTRAVENOUS | Status: AC

## 2021-07-16 MED ORDER — TICAGRELOR 90 MG PO TABS
ORAL_TABLET | ORAL | Status: DC | PRN
  Administered 2021-07-16: 180 mg via ORAL

## 2021-07-16 MED ORDER — ONDANSETRON HCL 4 MG/2ML IJ SOLN: 4.0000 mg | Freq: Four times a day (QID) | INTRAMUSCULAR | Status: DC | PRN

## 2021-07-16 MED ORDER — IOHEXOL 350 MG/ML SOLN
INTRAVENOUS | Status: DC | PRN
  Administered 2021-07-16: 105 mL

## 2021-07-16 MED ORDER — TICAGRELOR 90 MG PO TABS
ORAL_TABLET | ORAL | Status: AC
  Filled 2021-07-16: qty 2

## 2021-07-16 MED ORDER — LIDOCAINE HCL (PF) 1 % IJ SOLN
INTRAMUSCULAR | Status: DC | PRN
  Administered 2021-07-16: 2 mL

## 2021-07-16 MED ORDER — METOPROLOL TARTRATE 25 MG PO TABS
25.0000 mg | ORAL_TABLET | Freq: Two times a day (BID) | ORAL | Status: DC
  Administered 2021-07-16: 25 mg via ORAL
  Filled 2021-07-16 (×2): qty 1

## 2021-07-16 MED ORDER — ASPIRIN 81 MG PO CHEW
81.0000 mg | CHEWABLE_TABLET | Freq: Every day | ORAL | Status: DC
  Administered 2021-07-17: 81 mg via ORAL
  Filled 2021-07-16: qty 1

## 2021-07-16 MED ORDER — TIROFIBAN (AGGRASTAT) BOLUS VIA INFUSION
INTRAVENOUS | Status: DC | PRN
  Administered 2021-07-16: 2500 ug via INTRAVENOUS

## 2021-07-16 MED ORDER — MIDAZOLAM HCL 2 MG/2ML IJ SOLN
INTRAMUSCULAR | Status: AC
  Filled 2021-07-16: qty 2

## 2021-07-16 MED ORDER — LABETALOL HCL 5 MG/ML IV SOLN: 10.0000 mg | INTRAVENOUS | Status: AC | PRN

## 2021-07-16 MED ORDER — TIROFIBAN HCL IN NACL 5-0.9 MG/100ML-% IV SOLN
INTRAVENOUS | Status: AC
  Filled 2021-07-16: qty 100

## 2021-07-16 MED ORDER — SODIUM CHLORIDE 0.9% FLUSH: 3.0000 mL | INTRAVENOUS | Status: DC | PRN

## 2021-07-16 MED ORDER — VERAPAMIL HCL 2.5 MG/ML IV SOLN
INTRAVENOUS | Status: DC | PRN
  Administered 2021-07-16: 10 mL via INTRA_ARTERIAL

## 2021-07-16 MED ORDER — SODIUM CHLORIDE 0.9 % IV SOLN: 250.0000 mL | INTRAVENOUS | Status: DC | PRN

## 2021-07-16 MED ORDER — ACETAMINOPHEN 325 MG PO TABS
650.0000 mg | ORAL_TABLET | ORAL | Status: DC | PRN
Start: 2021-07-16 — End: 2021-07-17

## 2021-07-16 MED ORDER — SODIUM CHLORIDE 0.9% FLUSH
3.0000 mL | Freq: Two times a day (BID) | INTRAVENOUS | Status: DC
  Administered 2021-07-16 – 2021-07-17 (×2): 3 mL via INTRAVENOUS

## 2021-07-16 MED ORDER — HEPARIN SODIUM (PORCINE) 1000 UNIT/ML IJ SOLN
INTRAMUSCULAR | Status: DC | PRN
  Administered 2021-07-16: 5000 [IU] via INTRAVENOUS
  Administered 2021-07-16: 4000 [IU] via INTRAVENOUS

## 2021-07-16 MED ORDER — TICAGRELOR 90 MG PO TABS
90.0000 mg | ORAL_TABLET | Freq: Two times a day (BID) | ORAL | Status: DC
  Administered 2021-07-17 (×2): 90 mg via ORAL
  Filled 2021-07-16 (×2): qty 1

## 2021-07-16 MED ORDER — NITROGLYCERIN 1 MG/10 ML FOR IR/CATH LAB
INTRA_ARTERIAL | Status: DC | PRN
  Administered 2021-07-16: 200 ug

## 2021-07-16 MED ORDER — LIDOCAINE HCL (PF) 1 % IJ SOLN
INTRAMUSCULAR | Status: AC
  Filled 2021-07-16: qty 30

## 2021-07-16 SURGICAL SUPPLY — 20 items
BALL SAPPHIRE NC24 4.5X18 (BALLOONS) ×2
BALLN SAPPHIRE 2.5X12 (BALLOONS) ×2
BALLOON SAPPHIRE 2.5X12 (BALLOONS) IMPLANT
BALLOON SAPPHIRE NC24 4.5X18 (BALLOONS) IMPLANT
BAND CMPR LRG ZPHR (HEMOSTASIS) ×1
BAND ZEPHYR COMPRESS 30 LONG (HEMOSTASIS) ×1 IMPLANT
CATH INFINITI 5 FR JL3.5 (CATHETERS) ×1 IMPLANT
CATH INFINITI JR4 5F (CATHETERS) ×1 IMPLANT
CATH VISTA GUIDE 6FR XBLAD3.5 (CATHETERS) ×1 IMPLANT
GLIDESHEATH SLEND SS 6F .021 (SHEATH) ×1 IMPLANT
GUIDEWIRE INQWIRE 1.5J.035X260 (WIRE) IMPLANT
INQWIRE 1.5J .035X260CM (WIRE) ×2
KIT ENCORE 26 ADVANTAGE (KITS) ×1 IMPLANT
KIT HEART LEFT (KITS) ×3 IMPLANT
PACK CARDIAC CATHETERIZATION (CUSTOM PROCEDURE TRAY) ×3 IMPLANT
STENT SYNERGY XD 4.0X24 (Permanent Stent) IMPLANT
SYNERGY XD 4.0X24 (Permanent Stent) ×2 IMPLANT
TRANSDUCER W/STOPCOCK (MISCELLANEOUS) ×3 IMPLANT
TUBING CIL FLEX 10 FLL-RA (TUBING) ×3 IMPLANT
WIRE COUGAR XT STRL 190CM (WIRE) ×1 IMPLANT

## 2021-07-16 NOTE — H&P (Signed)
Cardiology Admission History and Physical:   Patient ID: Derrick Turner MRN: [id number]; DOB: [birth year]   Admission date: (Not on file)  PCP:  Pcp, No   [facility] Providers Cardiologist:  Sanda Klein, MD     Chief Complaint:  Chest pain/STEMI  Patient Profile:   Derrick Turner is a 51 y.o. 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  History of Present Illness:   Derrick Turner is a 51 yo male with PMH noted above. He has been followed by [name] in the past. Underwent cardiac cath 03/2016 that showed moderate CAD with recommendations for medical therapy. He was lost to follow up until 07/2019 when he was seen back in the office by Almyra Deforest. He denied any exertional chest pain or dyspnea. Was working as an Clinical biochemist. He had not been on ASA or lipitor. Still smoking. He was resumed on ASA and Lipitor with plans for follow up. He has not been seen in our office since 2021.   He began having chest pain around 1pm today with associated dyspnea and diaphoresis. He went to the [facility] ED and his EKG showed inferior ST elevation. Code STEMI called by ED staff.    Past Medical History:  Diagnosis Date   Coronary artery disease cardiologist--- dr croitoru   ETT 04-01-2016 in epic, reproduced chest pain but no EKG changes;  cardiac cath 04-06-2016 moderate diffuse nonobstructive disease, aggressive medical management   History of 2019 novel coronavirus disease (COVID-19) 03/02/2020   per pt tested positive covid, copy of result in epic, stated mild symptoms that resolved   History of kidney stones    Incomplete right bundle branch block    Right ureteral calculus    Wears glasses     Past Surgical History:  Procedure Laterality Date   APPENDECTOMY  teen   BACK SURGERY     two   BIOPSY  08/11/2020   Procedure: BIOPSY;  Surgeon: Eloise Harman, DO;  [facility];  Service: Endoscopy;;   COLONOSCOPY WITH PROPOFOL N/A 08/11/2020   Procedure: COLONOSCOPY WITH PROPOFOL;  Surgeon: Eloise Harman, DO;  Location: [facility];  Service: Endoscopy;  Laterality: N/A;  7:30am   CYSTOSCOPY WITH RETROGRADE PYELOGRAM, URETEROSCOPY AND STENT PLACEMENT Right 04/14/2020   Procedure: CYSTOSCOPY WITH RETROGRADE PYELOGRAM, URETEROSCOPY AND STENT PLACEMENT;  Surgeon: Remi Haggard, MD;  [facility];  Service: Urology;  Laterality: Right;   EXTRACORPOREAL SHOCK WAVE LITHOTRIPSY Right 03/23/2020   Procedure: EXTRACORPOREAL SHOCK WAVE LITHOTRIPSY (ESWL);  Surgeon: Lucas Mallow, MD;  Location: [facility];  Service: Urology;  Laterality: Right;   HOLMIUM LASER APPLICATION Right 52/77/8242   Procedure: HOLMIUM LASER APPLICATION;  Surgeon: Remi Haggard, MD;  Location: [facility];  Service: Urology;  Laterality: Right;   LAPAROSCOPIC CHOLECYSTECTOMY  2012 approx   LEFT HEART CATH AND CORONARY ANGIOGRAPHY N/A 04/06/2016   Procedure: Left Heart Cath and Coronary Angiography;  Surgeon: Sherren Mocha, MD;  Location: [facility];  Service: Cardiovascular;  Laterality: N/A;   ORCHIECTOMY Right 1990s   per pt benign cyst   PARTIAL COLECTOMY Right 09/02/2020   Procedure: HEMICOLECTOMY;  Surgeon: Aviva Signs, MD;  Location: [facility];  Service: General;  Laterality: Right;   POLYPECTOMY  08/11/2020   Procedure: POLYPECTOMY;  Surgeon: Eloise Harman, DO;  Location: [facility];  Service: Endoscopy;;   SHOULDER SURGERY Bilateral left 2019;  right 2016   SHOULDER SURGERY Left    SHOULDER SURGERY Right      Medications Prior to Admission: Prior to Admission medications   Medication Sig Start Date End Date Taking? Authorizing Provider  atorvastatin (LIPITOR) 40 MG tablet TAKE 1 TABLET BY MOUTH EVERY DAY 08/12/20   Almyra Deforest, PA  cetirizine (ZYRTEC) 10 MG tablet Take 10 mg by mouth in the  morning.    [provider]  docusate sodium (COLACE) 100 MG capsule Take 100 mg by mouth at bedtime.    [provider]  gabapentin (NEURONTIN) 300 MG capsule Take 600 mg by mouth at bedtime. 03/11/16   [provider]  ibuprofen (ADVIL) 200 MG tablet Take 800 mg by mouth in the morning and at bedtime. Patient not taking: Reported on 09/17/2020    [provider]  Melatonin 10 MG TABS Take 20 mg by mouth at bedtime.    [provider]  ondansetron (ZOFRAN) 4 MG tablet Take 1 tablet (4 mg total) by mouth every 6 (six) hours as needed for nausea or vomiting. 06/23/20   Mahala Menghini, PA-C  oxyCODONE-acetaminophen (PERCOCET) 5-325 MG tablet Take 1 tablet by mouth every 4 (four) hours as needed for severe pain. 09/04/20 09/04/21  Aviva Signs, MD  Probiotic Product (PROBIOTIC PO) Take 1 capsule by mouth in the morning.    [provider]     Allergies:   No Known Allergies  Social History:   Social History   Socioeconomic History   Marital status: Married    Spouse name: Not on file   Number of children: Not on file   Years of education: Not on file   Highest education level: Not on file  Occupational History   Not on file  Tobacco Use   Smoking status: Every Day    Packs/day: 1.50    Years: 35.00    Total pack years: 52.50    Types: Cigarettes   Smokeless tobacco: Never  Vaping Use   Vaping Use: Never used  Substance and Sexual Activity   Alcohol use: Yes    Comment: occasional   Drug use: Never   Sexual activity: Not on file  Other Topics Concern   Not on file  Social History Narrative   Not on file   Social Determinants of Health   Financial Resource Strain: Not on file  Food Insecurity: Not on file  Transportation Needs: Not on file  Physical Activity: Not on file  Stress: Not on file  Social Connections: Not on file  Intimate Partner Violence: Not on file    Family History:   The patient's family history  includes Heart attack in his father, maternal grandfather, and maternal grandmother. There is no history of Colon cancer or Colon polyps.    ROS:  Please see the history of present illness.  All other ROS reviewed and negative.     Physical Exam/Data:  There were no vitals filed for this visit. No intake or output data in the 24 hours ending 07/16/21 1451    09/17/2020   11:15 AM 09/10/2020    9:57 AM 08/31/2020    2:31 PM  Last 3 Weights  Weight (lbs) 201 lb 12.8 oz 204 lb 225 lb  Weight (kg) 91.536 kg 92.534 kg 102.059 kg     There is no height or weight on file to calculate BMI.  General:  Well  nourished, well developed, appears uncomfortable HEENT: normal Neck: no JVD Vascular: No carotid bruits; Distal pulses 2+ bilaterally   Cardiac:  normal S1, S2; RRR; no murmur  Lungs:  clear to auscultation bilaterally, no wheezing, rhonchi or rales  Abd: soft, nontender, no hepatomegaly  Ext: no LE edema Musculoskeletal:  No deformities, BUE and BLE strength normal and equal Skin: warm and dry  Neuro:  CNs 2-12 intact, no focal abnormalities noted Psych:  Normal affect    EKG:  The ECG that was done was personally reviewed and demonstrates sinus with 1-2 mm inferior ST elevation  Relevant CV Studies:  Cath: 03/2016  Prox RCA lesion, 25 %stenosed. Mid RCA lesion, 30 %stenosed. Dist RCA lesion, 30 %stenosed. Prox Cx to Mid Cx lesion, 25 %stenosed. Mid LAD lesion, 40 %stenosed.   Diffuse nonobstructive CAD   Suspect noncardiac chest pain. Recommend aggressive medical therapy/tobacco cessation/risk reduction measures.  Echo: 03/2016  Study Conclusions   - Left ventricle: The cavity size was moderately dilated. Systolic    function was normal. The estimated ejection fraction was in the    range of 55% to 60%. Wall motion was normal; there were no    regional wall motion abnormalities. Left ventricular diastolic    function parameters were normal.  - Aortic valve: Trileaflet;  normal thickness, mildly calcified    leaflets.  - Mitral valve: There was trivial regurgitation.  - Pulmonary arteries: Systolic pressure could not be accurately    estimated.   Laboratory Data:  High Sensitivity Troponin:  No results for input(s): "TROPONINIHS" in the last 720 hours.    ChemistryNo results for input(s): "NA", "K", "CL", "CO2", "GLUCOSE", "BUN", "CREATININE", "CALCIUM", "MG", "GFRNONAA", "GFRAA", "ANIONGAP" in the last 168 hours.  No results for input(s): "PROT", "ALBUMIN", "AST", "ALT", "ALKPHOS", "BILITOT" in the last 168 hours. Lipids No results for input(s): "CHOL", "TRIG", "HDL", "LABVLDL", "LDLCALC", "CHOLHDL" in the last 168 hours. HematologyNo results for input(s): "WBC", "RBC", "HGB", "HCT", "MCV", "MCH", "MCHC", "RDW", "PLT" in the last 168 hours. Thyroid No results for input(s): "TSH", "FREET4" in the last 168 hours. BNPNo results for input(s): "BNP", "PROBNP" in the last 168 hours.  DDimer No results for input(s): "DDIMER" in the last 168 hours.   Radiology/Studies:  No results found.   Assessment and Plan:   Derrick Turner is a 51 y.o. 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  CAD/Inferior STEMI: Cardiac cath with severe mid LAD stenosis with evidence of thrombus in the lesion as well as distal embolization to the small apical LAD. Successful PTCA/DES x 1 mid LAD. Plan to continue ASA/Brilinta for one year. Continue Aggrastat drip for 2 hours. High intensity statin and beta blocker. Echo tomorrow. Possible d/c home tomorrow.   2.   HLD: High intensity statin  3.  Tobacco use:  Smoking cessation is recommended.    Severity of Illness: The appropriate patient status for this patient is INPATIENT. Inpatient status is judged to be reasonable and necessary in order to provide the required intensity of service to ensure the patient's safety. The  patient's presenting symptoms, physical exam findings, and initial radiographic and laboratory data in the context of their chronic comorbidities is felt to place them at high risk for further clinical deterioration. Furthermore, it is not anticipated that the patient will be medically stable for discharge from the hospital within 2 midnights of admission.   *  I certify that at the point of admission it is my clinical judgment that the patient will require inpatient hospital care spanning beyond 2 midnights from the point of admission due to high intensity of service, high risk for further deterioration and high frequency of surveillance required.*   For questions or updates, please contact [place] Please consult [email or web address] for contact info under     Jeanice Lim 07/16/2021 5:06 PM

## 2021-07-17 ENCOUNTER — Inpatient Hospital Stay (HOSPITAL_COMMUNITY): Payer: BC Managed Care – PPO

## 2021-07-17 ENCOUNTER — Other Ambulatory Visit: Payer: Self-pay

## 2021-07-17 DIAGNOSIS — I2510 Atherosclerotic heart disease of native coronary artery without angina pectoris: Secondary | ICD-10-CM

## 2021-07-17 DIAGNOSIS — I2119 ST elevation (STEMI) myocardial infarction involving other coronary artery of inferior wall: Secondary | ICD-10-CM

## 2021-07-17 LAB — BASIC METABOLIC PANEL
Anion gap: 6 (ref 5–15)
BUN: 13 mg/dL (ref 6–20)
CO2: 24 mmol/L (ref 22–32)
Calcium: 8.1 mg/dL — ABNORMAL LOW (ref 8.9–10.3)
Chloride: 107 mmol/L (ref 98–111)
Creatinine, Ser: 0.61 mg/dL (ref 0.61–1.24)
GFR, Estimated: >60 mL/min (ref >60)
Glucose, Bld: 110 mg/dL — ABNORMAL HIGH (ref 70–99)
Potassium: 4 mmol/L (ref 3.5–5.1)
Sodium: 137 mmol/L (ref 135–145)

## 2021-07-17 LAB — CBC
HCT: 43.5 % (ref 39.0–52.0)
Hemoglobin: 14.7 g/dL (ref 13.0–17.0)
MCH: 29.8 pg (ref 26.0–34.0)
MCHC: 33.8 g/dL (ref 30.0–36.0)
MCV: 88.2 fL (ref 80.0–100.0)
Platelets: 178 10*3/uL (ref 150–400)
RBC: 4.93 MIL/uL (ref 4.22–5.81)
RDW: 13.4 % (ref 11.5–15.5)
WBC: 10.9 10*3/uL — ABNORMAL HIGH (ref 4.0–10.5)
nRBC: 0 % (ref 0.0–0.2)

## 2021-07-17 LAB — ECHOCARDIOGRAM COMPLETE
AR max vel: 2.69 cm2
AV Area VTI: 2.86 cm2
AV Area mean vel: 2.55 cm2
AV Mean grad: 3 mmHg
AV Peak grad: 6.1 mmHg
Ao pk vel: 1.23 m/s
Area-P 1/2: 3.3 cm2
Calc EF: 57.1 %
Height: 70 in
S' Lateral: 4 cm
Single Plane A2C EF: 57.3 %
Single Plane A4C EF: 54.3 %
Weight: 3315.72 oz

## 2021-07-17 LAB — LIPID PANEL
Cholesterol: 78 mg/dL (ref 0–200)
HDL: 35 mg/dL — ABNORMAL LOW (ref >40)
LDL Cholesterol: 26 mg/dL (ref 0–99)
Total CHOL/HDL Ratio: 2.2 RATIO
Triglycerides: 84 mg/dL (ref <150)
VLDL: 17 mg/dL (ref 0–40)

## 2021-07-17 MED ORDER — ATORVASTATIN CALCIUM 80 MG PO TABS: 80.0000 mg | ORAL_TABLET | Freq: Every day | ORAL | 3 refills | Status: DC

## 2021-07-17 MED ORDER — ASPIRIN 81 MG PO TBEC: 81.0000 mg | DELAYED_RELEASE_TABLET | Freq: Every day | ORAL | 2 refills | Status: AC

## 2021-07-17 MED ORDER — TICAGRELOR 90 MG PO TABS: 90.0000 mg | ORAL_TABLET | Freq: Two times a day (BID) | ORAL | 3 refills | Status: DC

## 2021-07-17 MED ORDER — METOPROLOL TARTRATE 25 MG PO TABS: 25.0000 mg | ORAL_TABLET | Freq: Two times a day (BID) | ORAL | 3 refills | Status: DC

## 2021-07-17 MED ORDER — NITROGLYCERIN 0.4 MG SL SUBL: 0.4000 mg | SUBLINGUAL_TABLET | SUBLINGUAL | 3 refills | Status: DC | PRN

## 2021-07-17 MED ORDER — CHLORHEXIDINE GLUCONATE CLOTH 2 % EX PADS
6.0000 | MEDICATED_PAD | Freq: Every day | CUTANEOUS | Status: DC
  Administered 2021-07-17: 6 via TOPICAL

## 2021-07-17 NOTE — Progress Notes (Addendum)
CARDIAC REHAB PHASE I   PRE:  Rate/Rhythm: 59 SB  BP:  Sitting: 124/78      SaO2: 97 RA  MODE:  Ambulation: 370 ft   POST:  Rate/Rhythm: 75 SR w/ PVC  BP:  Sitting: 126/77      SaO2: 96 RA  Pt tolerated exercise well and amb 370 ft independently. Pt denies CP, SOB, or dizziness throughout walk. Pt to recliner after walk w/ call bell in reach. Ed given to pt. Discussed restrictions, MI booklet, NTG use, ASA / Brilinta, heart healthy diet, and exercise guidelines. Will refer to CRPHII AP. Pt showed understanding of ed. Pt does not currently have insurance. Discussed social work referral w/ Therapist, sports. Will continue to follow.  [place], MS, ACSM-CEP 07/17/2021 8:57 AM

## 2021-07-17 NOTE — Progress Notes (Signed)
  Echocardiogram 2D Echocardiogram has been performed.  Derrick Turner 07/17/2021, 11:44 AM

## 2021-07-17 NOTE — TOC Transition Note (Signed)
Transition of Care [facility]) - CM/SW Discharge Note   Patient Details  Name: Derrick Turner MRN: [id number] Date of Birth: 1971/01/10  Transition of Care [facility]) CM/SW Contact:  Tiajuana Amass [place], [place] Phone Number: [phone] 07/17/2021, 2:23 PM   Clinical Narrative:  [facility] team for discharge planning. Spoke with patient telephonically. He is alert, verbally responsive and pleasant. He shares that his wife will transport him home and will care for him. Discussed discharge instructions- signs and symptoms of infection, when to call the doctor, activity restrictions and follow in 7-14 days. Patient expressed understanding. Will continue to monitor for any additional discharge plans.     Final next level of care: Home/Self Care Barriers to Discharge: No Barriers Identified   Patient Goals and CMS Choice        Discharge Placement                       Discharge Plan and Services                DME Arranged: N/A           HH Agency: NA        Social Determinants of Health (SDOH) Interventions Food Insecurity Interventions: Intervention Not Indicated Housing Interventions: Intervention Not Indicated Transportation Interventions: Intervention Not Indicated   Readmission Risk Interventions     No data to display

## 2021-07-17 NOTE — Discharge Summary (Cosign Needed Addendum)
Discharge Summary    Patient ID: Derrick Turner MRN: [id number]; DOB: Apr 13, 1970  Admit date: 07/16/2021 Discharge date: 07/17/2021  PCP:  Pcp, No   [facility] Providers Cardiologist:  Sanda Klein, MD   {  Discharge Diagnoses    Principal Problem:   Acute MI, inferior wall [facility]) Active Problems:   ACS (acute coronary syndrome) ([place])   Tobacco abuse   Dyslipidemia   CAD (coronary artery disease)   Diagnostic Studies/Procedures    Echo 07/17/21: Final read pending _____________  LHC 07/16/21:   Prox Cx to Mid Cx lesion is 25% stenosed.   Prox RCA lesion is 25% stenosed.   Mid RCA lesion is 30% stenosed.   Dist RCA lesion is 30% stenosed.   Mid LAD lesion is 95% stenosed.   A drug-eluting stent was successfully placed using a SYNERGY XD 4.0X24.   Post intervention, there is a 0% residual stenosis.   The left ventricular systolic function is normal.   LV end diastolic pressure is normal.   The left ventricular ejection fraction is 55-65% by visual estimate.   Severe thrombotic stenosis mid LAD Successful PTCA/DES x 1 mid LAD Occlusion of the small apical LAD, likely from embolization of thrombus prior to arrival in the cath lab.  Mild non-obstructive disease in the RCA and Circumflex.  Normal LV systolic function   Recommendations: Will admit to the ICU. Continue DAPT for one year with ASA/Brilinta. Will increase Lipitor to 80 mg daily. I will start a beta blocker. Continue Aggrastat for 2 hours. Echo tomorrow. May be able to fast track and discharge home tomorrow if stable.     History of Present Illness     Derrick Turner is a 51 y.o. 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  Derrick Turner is a 51 yo male with PMH noted above. He has been followed by [name] in the past. Underwent cardiac cath 03/2016 that showed moderate CAD with recommendations  for medical therapy. He was lost to follow up until 07/2019 when he was seen back in the office by Almyra Deforest. He denied any exertional chest pain or dyspnea. Was working as an Clinical biochemist. He had not been on ASA or lipitor. Still smoking. He was resumed on ASA and Lipitor with plans for follow up. He has not been seen in our office since 2021.    He began having chest pain around 1pm today with associated dyspnea and diaphoresis. He went to the [facility] ED and his EKG showed inferior ST elevation. Code STEMI called by ED staff.   Hospital Course     Consultants: none  Inferior STEMI CAD Patient was taken emergently to the Cath Lab for definitive angiography.  Heart cath showed severe mid LAD stenosis with evidence of thrombus in the lesion as well as distal embolization to the small apical LAD.  This was treated successfully with PTCA/DES x1 to the mid LAD.  He was placed on DAPT with aspirin and Brilinta for 1 year.  He has walked with cardiac rehab and reports no further chest pain.  Blood pressure controlled.  Will need to switch to plavix after 30 days of brilinta, no insurance.   Hyperlipidemia with LDL goal less than 70 Lipitor was increased from 40 mg to 80 mg daily. No lipid profile on file, I added this on to this AM labs. LPA pending.  Tobacco abuse Strongly encouraged cessation given LAD disease.   ADDENDUM: Initially discharged on metoprolol. Pt has bradycardic in the 50s, BB held at discharge, called pharmacy to place on hold. Please evaluate in clinic follow up to see if he can tolerate a low dose BB.   Did the patient have an acute coronary syndrome (MI, NSTEMI, STEMI, etc) this admission?:  Yes                               AHA/ACC Clinical Performance & Quality Measures: Aspirin prescribed? - Yes ADP Receptor Inhibitor (Plavix/Clopidogrel, Brilinta/Ticagrelor or Effient/Prasugrel) prescribed (includes medically managed patients)? - Yes Beta Blocker prescribed?  - Yes High Intensity Statin (Lipitor 40-'80mg'$  or Crestor 20-'40mg'$ ) prescribed? - Yes EF assessed during THIS hospitalization? - Yes For EF <40%, was ACEI/ARB prescribed? - Not Applicable (EF >/= 46%) For EF <40%, Aldosterone Antagonist (Spironolactone or Eplerenone) prescribed? - Not Applicable (EF >/= 65%) Cardiac Rehab Phase II ordered (including medically managed patients)? - Yes       The patient will be scheduled for a TOC follow up appointment in 7-14 days.  A message has been sent to the [facility] and Scheduling Pool at the office where the patient should be seen for follow up.  _____________  Discharge Vitals Blood pressure 127/77, pulse 79, temperature 98.2 F (36.8 C), temperature source Oral, resp. rate (!) 24, height '5\' 10"'$  (1.778 m), weight 94 kg, SpO2 99 %.  Filed Weights   07/16/21 1616 07/17/21 0600  Weight: 95.3 kg 94 kg    Labs & Radiologic Studies    CBC Recent Labs    07/17/21 0148  WBC 10.9*  HGB 14.7  HCT 43.5  MCV 88.2  PLT 993   Basic Metabolic Panel Recent Labs    07/17/21 0148  NA 137  K 4.0  CL 107  CO2 24  GLUCOSE 110*  BUN 13  CREATININE 0.61  CALCIUM 8.1*   Liver Function Tests No results for input(s): "AST", "ALT", "ALKPHOS", "BILITOT", "PROT", "ALBUMIN" in the last 72 hours. No results for input(s): "LIPASE", "AMYLASE" in the last 72 hours. High Sensitivity Troponin:   Recent Labs  Lab 07/16/21 1831 07/16/21 2144  TROPONINIHS 1,603* 5,487*    BNP Invalid input(s): "POCBNP" D-Dimer No results for input(s): "DDIMER" in the last 72 hours. Hemoglobin A1C No results for input(s): "HGBA1C" in the last 72 hours. Fasting Lipid Panel No results for input(s): "CHOL", "HDL", "LDLCALC", "TRIG", "CHOLHDL", "LDLDIRECT" in the last 72 hours. Thyroid Function Tests No results for input(s): "TSH", "T4TOTAL", "T3FREE", "THYROIDAB" in the last 72 hours.  Invalid input(s): "FREET3" _____________  ECHOCARDIOGRAM COMPLETE  Result Date:  07/17/2021    ECHOCARDIOGRAM REPORT   Patient Name:   Derrick Turner Date of Exam: 07/17/2021 Medical Rec #:  [id number]         Height:       70.0 in Accession #:    [id number]        Weight:       207.2 lb Date of Birth:  [birth year]         BSA:          2.119 m Patient Age:    1 years          BP:           126/81 mmHg Patient Gender: M  HR:           49 bpm. Exam Location:  Inpatient Procedure: 2D Echo, Cardiac Doppler and Color Doppler Indications:    CAD-native vessel  History:        Patient has prior history of Echocardiogram examinations, most                 recent 04/01/2016. CAD; Risk Factors:Current Smoker and                 Dyslipidemia.  Sonographer:    Clayton Lefort RDCS (AE) Referring Phys: [place]  1. Left ventricular ejection fraction, by estimation, is 55 to 60%. The left ventricle has normal function. The left ventricle has no regional wall motion abnormalities. Left ventricular diastolic parameters were normal.  2. Right ventricular systolic function is normal. The right ventricular size is normal. Tricuspid regurgitation signal is inadequate for assessing PA pressure.  3. The mitral valve is normal in structure. Trivial mitral valve regurgitation. No evidence of mitral stenosis.  4. The aortic valve was not well visualized. There is mild calcification of the aortic valve. Aortic valve regurgitation is trivial. No aortic stenosis is present.  5. The inferior vena cava is dilated in size with <50% respiratory variability, suggesting right atrial pressure of 15 mmHg. FINDINGS  Left Ventricle: Left ventricular ejection fraction, by estimation, is 55 to 60%. The left ventricle has normal function. The left ventricle has no regional wall motion abnormalities. 3D left ventricular ejection fraction analysis performed but not reported based on interpreter judgement due to suboptimal tracking. The left ventricular internal cavity size was normal in size.  There is no left ventricular hypertrophy. Left ventricular diastolic parameters were normal. Right Ventricle: The right ventricular size is normal. No increase in right ventricular wall thickness. Right ventricular systolic function is normal. Tricuspid regurgitation signal is inadequate for assessing PA pressure. Left Atrium: Left atrial size was normal in size. Right Atrium: Right atrial size was normal in size. Pericardium: Trivial pericardial effusion is present. Presence of epicardial fat layer. Mitral Valve: The mitral valve is normal in structure. Trivial mitral valve regurgitation. No evidence of mitral valve stenosis. Tricuspid Valve: The tricuspid valve is normal in structure. Tricuspid valve regurgitation is not demonstrated. No evidence of tricuspid stenosis. Aortic Valve: The aortic valve was not well visualized. There is mild calcification of the aortic valve. Aortic valve regurgitation is trivial. No aortic stenosis is present. Aortic valve mean gradient measures 3.0 mmHg. Aortic valve peak gradient measures 6.1 mmHg. Aortic valve area, by VTI measures 2.86 cm. Pulmonic Valve: The pulmonic valve was normal in structure. Pulmonic valve regurgitation is not visualized. No evidence of pulmonic stenosis. Aorta: The aortic root is normal in size and structure. Venous: The inferior vena cava is dilated in size with less than 50% respiratory variability, suggesting right atrial pressure of 15 mmHg. IAS/Shunts: No atrial level shunt detected by color flow Doppler.  LEFT VENTRICLE PLAX 2D LVIDd:         5.70 cm      Diastology LVIDs:         4.00 cm      LV e' medial:    9.90 cm/s LV PW:         1.30 cm      LV E/e' medial:  7.4 LV IVS:        1.10 cm      LV e' lateral:   10.40 cm/s LVOT diam:  2.10 cm      LV E/e' lateral: 7.1 LV SV:         71 LV SV Index:   33 LVOT Area:     3.46 cm                              3D Volume EF: LV Volumes (MOD)            LV EDV:       165 ml LV vol d, MOD A2C: 131.0 ml  LV ESV:       64 ml LV vol d, MOD A4C: 156.0 ml LV SV:        100 ml LV vol s, MOD A2C: 55.9 ml LV vol s, MOD A4C: 71.3 ml LV SV MOD A2C:     75.1 ml LV SV MOD A4C:     156.0 ml LV SV MOD BP:      84.3 ml RIGHT VENTRICLE             IVC RV Basal diam:  3.50 cm     IVC diam: 2.80 cm RV Mid diam:    3.20 cm RV S prime:     14.40 cm/s TAPSE (M-mode): 2.7 cm LEFT ATRIUM             Index        RIGHT ATRIUM           Index LA diam:        3.50 cm 1.65 cm/m   RA Area:     21.00 cm LA Vol (A2C):   41.6 ml 19.63 ml/m  RA Volume:   69.30 ml  32.70 ml/m LA Vol (A4C):   40.2 ml 18.97 ml/m LA Biplane Vol: 41.6 ml 19.63 ml/m  AORTIC VALVE AV Area (Vmax):    2.69 cm AV Area (Vmean):   2.55 cm AV Area (VTI):     2.86 cm AV Vmax:           123.00 cm/s AV Vmean:          80.800 cm/s AV VTI:            0.247 m AV Peak Grad:      6.1 mmHg AV Mean Grad:      3.0 mmHg LVOT Vmax:         95.70 cm/s LVOT Vmean:        59.500 cm/s LVOT VTI:          0.204 m LVOT/AV VTI ratio: 0.83  AORTA Ao Root diam: 3.30 cm MITRAL VALVE MV Area (PHT): 3.30 cm    SHUNTS MV Decel Time: 230 msec    Systemic VTI:  0.20 m MV E velocity: 73.40 cm/s  Systemic Diam: 2.10 cm MV A velocity: 42.30 cm/s MV E/A ratio:  1.74 Cherlynn Kaiser MD Electronically signed by Cherlynn Kaiser MD Signature Date/Time: 07/17/2021/12:31:16 PM    Final    CARDIAC CATHETERIZATION  Result Date: 07/16/2021   Prox Cx to Mid Cx lesion is 25% stenosed.   Prox RCA lesion is 25% stenosed.   Mid RCA lesion is 30% stenosed.   Dist RCA lesion is 30% stenosed.   Mid LAD lesion is 95% stenosed.   A drug-eluting stent was successfully placed using a SYNERGY XD 4.0X24.   Post intervention, there is a 0% residual stenosis.   The left ventricular systolic function is normal.   LV  end diastolic pressure is normal.   The left ventricular ejection fraction is 55-65% by visual estimate. Severe thrombotic stenosis mid LAD Successful PTCA/DES x 1 mid LAD Occlusion of the small apical LAD,  likely from embolization of thrombus prior to arrival in the cath lab. Mild non-obstructive disease in the RCA and Circumflex. Normal LV systolic function Recommendations: Will admit to the ICU. Continue DAPT for one year with ASA/Brilinta. Will increase Lipitor to 80 mg daily. I will start a beta blocker. Continue Aggrastat for 2 hours. Echo tomorrow. May be able to fast track and discharge home tomorrow if stable.    Disposition   Pt is being discharged home today in good condition.  Follow-up Plans & Appointments     Follow-up Information     Almyra Deforest, [place] Follow up on 07/29/2021.   Specialties: Cardiology, Radiology Why: 3:10 pm Contact information: [address] [place] [place] [address] [phone]                Discharge Instructions     Amb Referral to Cardiac Rehabilitation   Complete by: As directed    Diagnosis:  Coronary Stents PTCA STEMI     After initial evaluation and assessments completed: Virtual Based Care may be provided alone or in conjunction with Phase 2 Cardiac Rehab based on patient barriers.: Yes   Diet - low sodium heart healthy   Complete by: As directed    Discharge instructions   Complete by: As directed    No driving for 7 days. No lifting over 5 lbs for 1 week. No sexual activity for 1 week. You may return to work after follow up with cardiology. Keep procedure site clean & dry. If you notice increased pain, swelling, bleeding or pus, call/return!  You may shower, but no soaking baths/hot tubs/pools for 1 week.   Increase activity slowly   Complete by: As directed        Discharge Medications   Allergies as of 07/17/2021   No Known Allergies      Medication List     STOP taking these medications    ibuprofen 200 MG tablet Commonly known as: ADVIL   oxyCODONE-acetaminophen 5-325 MG tablet Commonly known as: Percocet       TAKE these medications    aspirin EC 81 MG tablet Take 1 tablet (81 mg total) by  mouth daily. Swallow whole.   atorvastatin 80 MG tablet Commonly known as: LIPITOR Take 1 tablet (80 mg total) by mouth daily. Start taking on: July 18, 2021 What changed:  medication strength how much to take   cetirizine 10 MG tablet Commonly known as: ZYRTEC Take 10 mg by mouth daily.   docusate sodium 100 MG capsule Commonly known as: COLACE Take 100 mg by mouth at bedtime.   Melatonin 10 MG Tabs Take 20 mg by mouth at bedtime.   nitroGLYCERIN 0.4 MG SL tablet Commonly known as: Nitrostat Place 1 tablet (0.4 mg total) under the tongue every 5 (five) minutes as needed for chest pain.   ondansetron 4 MG tablet Commonly known as: Zofran Take 1 tablet (4 mg total) by mouth every 6 (six) hours as needed for nausea or vomiting.   PROBIOTIC PO Take 1 capsule by mouth daily.   ticagrelor 90 MG Tabs tablet Commonly known as: BRILINTA Take 1 tablet (90 mg total) by mouth 2 (two) times daily.           Outstanding Labs/Studies   LPA - ?[id number]  Duration of Discharge Encounter   Greater than 30 minutes including physician time.  Signed, Tami Lin Alliyah Roesler, PA 07/17/2021, 1:40 PM  Patient seen and examined with Doreene Adas PA.  Agree as above, with the following exceptions and changes as noted below. No chest pain, feels great and wants to go home. Gen: NAD, CV: RRR, no murmurs, Lungs: clear, Abd: soft, Extrem: Warm, well perfused, no edema, Neuro/Psych: alert and oriented x 3, normal mood and affect. All available labs, radiology testing, previous records reviewed. Ok to switch to plavix after 30 days given cost of Brilinta. Echo grossly normal. OK to dc home.   Tami Lin Amadu Schlageter, [place] 07/17/21 1:40 PM

## 2021-07-17 NOTE — Plan of Care (Signed)
  Problem: Education: Goal: Knowledge of General Education information will improve Description: Including pain rating scale, medication(s)/side effects and non-pharmacologic comfort measures Outcome: Adequate for Discharge   Problem: Clinical Measurements: Goal: Ability to maintain clinical measurements within normal limits will improve Outcome: Adequate for Discharge Goal: Will remain free from infection Outcome: Adequate for Discharge Goal: Diagnostic test results will improve Outcome: Adequate for Discharge Goal: Respiratory complications will improve Outcome: Adequate for Discharge Goal: Cardiovascular complication will be avoided Outcome: Adequate for Discharge   Problem: Health Behavior/Discharge Planning: Goal: Ability to manage health-related needs will improve Outcome: Adequate for Discharge   Problem: Activity: Goal: Risk for activity intolerance will decrease Outcome: Adequate for Discharge   Problem: Nutrition: Goal: Adequate nutrition will be maintained Outcome: Adequate for Discharge   Problem: Coping: Goal: Level of anxiety will decrease Outcome: Adequate for Discharge   Problem: Pain Managment: Goal: General experience of comfort will improve Outcome: Adequate for Discharge

## 2021-07-17 NOTE — Progress Notes (Signed)
VSS. Pt. Discharged to home via private vehicle driven by spouse. DC packet and education completed by this RN with pt. Pt has no further questions at this time. PIVs removed by this RN. NT assisted pt to front entrance for pick up by pt spouse.

## 2021-07-18 LAB — LIPOPROTEIN A (LPA): Lipoprotein (a): <8.4 nmol/L (ref <75.0)

## 2021-07-19 ENCOUNTER — Encounter (HOSPITAL_COMMUNITY): Payer: Self-pay | Admitting: Cardiovascular Disease

## 2021-07-20 ENCOUNTER — Telehealth: Payer: Self-pay

## 2021-07-20 NOTE — Telephone Encounter (Signed)
-----   Message from Ledora Bottcher, [place] sent at 07/17/2021 12:30 PM EDT ----- Pt will need a TOC phone call on Monday.  Thanks Angie

## 2021-07-20 NOTE — Telephone Encounter (Signed)
Left message to call back  

## 2021-07-20 NOTE — Telephone Encounter (Signed)
Patient contacted regarding discharge from 07/16/2021 - 07/17/2021 (23 hours); [place].  What is your wound status? Any signs/ symptoms of infection (Temp, redness/ red streaks, swelling, purulent drainage, foul odor or smell)? No, "just a little bruising".  Patient understands to follow up with provider Almyra Deforest, PA-C on 07-29-21 at 3:10 pm at [facility] office.  Patient understands discharge instructions? YES Patient understands medications and regiment? YES Patient understands to bring all medications to this visit? YES  Ask patient:  Are you enrolled in My Chart YES, ENROLLED

## 2021-07-20 NOTE — Telephone Encounter (Signed)
Pt is returning call. Req call back.

## 2021-07-27 ENCOUNTER — Emergency Department (HOSPITAL_COMMUNITY)
Admission: EM | Admit: 2021-07-27 | Discharge: 2021-07-27 | Disposition: A | Discharge status: Home / Self Care | Payer: BC Managed Care – PPO | Attending: Emergency Medicine | Admitting: Emergency Medicine

## 2021-07-27 ENCOUNTER — Other Ambulatory Visit: Payer: Self-pay

## 2021-07-27 ENCOUNTER — Encounter (HOSPITAL_COMMUNITY): Payer: Self-pay | Admitting: Emergency Medicine

## 2021-07-27 ENCOUNTER — Emergency Department (HOSPITAL_COMMUNITY): Payer: BC Managed Care – PPO

## 2021-07-27 DIAGNOSIS — Z720 Tobacco use: Secondary | ICD-10-CM

## 2021-07-27 DIAGNOSIS — R0789 Other chest pain: Secondary | ICD-10-CM | POA: Insufficient documentation

## 2021-07-27 DIAGNOSIS — I2510 Atherosclerotic heart disease of native coronary artery without angina pectoris: Secondary | ICD-10-CM | POA: Diagnosis not present

## 2021-07-27 DIAGNOSIS — R079 Chest pain, unspecified: Secondary | ICD-10-CM | POA: Insufficient documentation

## 2021-07-27 LAB — BASIC METABOLIC PANEL
Anion gap: 14 (ref 5–15)
BUN: 15 mg/dL (ref 6–20)
CO2: 23 mmol/L (ref 22–32)
Calcium: 9.7 mg/dL (ref 8.9–10.3)
Chloride: 102 mmol/L (ref 98–111)
Creatinine, Ser: 0.59 mg/dL — ABNORMAL LOW (ref 0.61–1.24)
GFR, Estimated: >60 mL/min (ref >60)
Glucose, Bld: 113 mg/dL — ABNORMAL HIGH (ref 70–99)
Potassium: 4.2 mmol/L (ref 3.5–5.1)
Sodium: 139 mmol/L (ref 135–145)

## 2021-07-27 LAB — CBC
HCT: 47.5 % (ref 39.0–52.0)
Hemoglobin: 16.7 g/dL (ref 13.0–17.0)
MCH: 30.1 pg (ref 26.0–34.0)
MCHC: 35.2 g/dL (ref 30.0–36.0)
MCV: 85.7 fL (ref 80.0–100.0)
Platelets: 196 10*3/uL (ref 150–400)
RBC: 5.54 MIL/uL (ref 4.22–5.81)
RDW: 12.9 % (ref 11.5–15.5)
WBC: 9.4 10*3/uL (ref 4.0–10.5)
nRBC: 0 % (ref 0.0–0.2)

## 2021-07-27 LAB — TROPONIN I (HIGH SENSITIVITY)
Troponin I (High Sensitivity): 25 ng/L — ABNORMAL HIGH (ref <18)
Troponin I (High Sensitivity): 26 ng/L — ABNORMAL HIGH (ref <18)

## 2021-07-27 MED ORDER — NITROGLYCERIN 0.4 MG SL SUBL
0.4000 mg | SUBLINGUAL_TABLET | Freq: Once | SUBLINGUAL | Status: AC
  Administered 2021-07-27: 0.4 mg via SUBLINGUAL
  Filled 2021-07-27: qty 1

## 2021-07-27 MED ORDER — ASPIRIN 81 MG PO CHEW
324.0000 mg | CHEWABLE_TABLET | Freq: Once | ORAL | Status: AC
  Administered 2021-07-27: 324 mg via ORAL
  Filled 2021-07-27: qty 4

## 2021-07-27 NOTE — ED Provider Triage Note (Signed)
Emergency Medicine Provider Triage Evaluation Note  Naftali Carchi Ertle , a 51 y.o. male  was evaluated in triage.  Pt complains of chest pain x this morning woke up with this pain.  Pressure from the entire chest with radiation onto the back.  Similar symptoms when he had a stent in place approximately 10 days ago.  Does report this pain feels less severe.  He is on a blood thinner currently, Brillinta twice a day.   Review of Systems  Positive: Chest pain Negative: Leg swelling, sob  Physical Exam  BP 126/80 (BP Location: Left Arm)   Pulse 71   Temp 98.7 F (37.1 C) (Oral)   Resp 16   SpO2 100%  Gen:   Awake, no distress   Resp:  Normal effort  MSK:   Moves extremities without difficulty  Other:    Medical Decision Making  Medically screening exam initiated at 10:46 AM.  Appropriate orders placed.  Dylyn [facility] was informed that the remainder of the evaluation will be completed by another provider, this initial triage assessment does not replace that evaluation, and the importance of remaining in the ED until their evaluation is complete.  Recent LEFT HEART CATH by Dr. Angelena Form on 07/13/2021 here with chest pain pain. Less severity than before.    Janeece Fitting, PA-C 07/27/21 1051

## 2021-07-27 NOTE — ED Provider Notes (Signed)
[facility] EMERGENCY DEPARTMENT Provider Note   CSN: [id number] Arrival date & time: 07/27/21  1037     History  Chief Complaint  Patient presents with   Chest Pain    Derrick Turner is a 51 y.o. male.  HPI 51 year old male presents with chest pain.  Originally started on 6/17.  On 6/9 he had an emergent cardiac catheterization due to a STEMI.  Had a stent placed.  Was doing okay until 6/17 when he noticed some chest pressure that feels similar but much milder 20 had the heart attack.  Has been coming and going.  He took a nitroglycerin on the first day but has not since.  Seems to be random as far as what causes it to come.  Will last a couple hours.  No significant shortness of breath.  This morning when he woke up the chest pain was there again but it has not gone away like the other times.  He took 3 nitroglycerin and was given 1 in triage as well as aspirin with no relief.  He rates is about a 4 or 5 out of 10.  Some discomfort in his left shoulder which is now also in his right shoulder.  No leg swelling or pleuritic symptoms.  No abdominal pain.  Home Medications Prior to Admission medications   Medication Sig Start Date End Date Taking? Authorizing Provider  aspirin EC 81 MG tablet Take 1 tablet (81 mg total) by mouth daily. Swallow whole. 07/17/21 07/17/22  Ledora Bottcher, PA  atorvastatin (LIPITOR) 80 MG tablet Take 1 tablet (80 mg total) by mouth daily. 07/18/21   Duke, Tami Lin, PA  cetirizine (ZYRTEC) 10 MG tablet Take 10 mg by mouth daily.    [provider]  docusate sodium (COLACE) 100 MG capsule Take 100 mg by mouth at bedtime.    [provider]  Melatonin 10 MG TABS Take 20 mg by mouth at bedtime.    [provider]  nitroGLYCERIN (NITROSTAT) 0.4 MG SL tablet Place 1 tablet (0.4 mg total) under the tongue every 5 (five) minutes as needed for chest pain. 07/17/21 07/17/22  Duke, Tami Lin, PA  ondansetron (ZOFRAN)  4 MG tablet Take 1 tablet (4 mg total) by mouth every 6 (six) hours as needed for nausea or vomiting. 06/23/20   Mahala Menghini, PA-C  Probiotic Product (PROBIOTIC PO) Take 1 capsule by mouth daily.    [provider]  ticagrelor (BRILINTA) 90 MG TABS tablet Take 1 tablet (90 mg total) by mouth 2 (two) times daily. 07/17/21   Ledora Bottcher, PA      Allergies    Patient has no known allergies.    Review of Systems   Review of Systems  Constitutional:  Negative for diaphoresis.  Respiratory:  Negative for shortness of breath.   Cardiovascular:  Positive for chest pain. Negative for leg swelling.  Gastrointestinal:  Negative for abdominal pain and vomiting.    Physical Exam Updated Vital Signs BP 109/73 (BP Location: Right Arm)   Pulse 60   Temp 97.7 F (36.5 C) (Oral)   Resp 11   SpO2 98%  Physical Exam Vitals and nursing note reviewed.  Constitutional:      General: He is not in acute distress.    Appearance: He is well-developed. He is not ill-appearing or diaphoretic.  HENT:     Head: Normocephalic and atraumatic.  Cardiovascular:     Rate and Rhythm: Normal rate  and regular rhythm.     Pulses:          Radial pulses are 2+ on the right side and 2+ on the left side.     Heart sounds: Normal heart sounds.  Pulmonary:     Effort: Pulmonary effort is normal.     Breath sounds: Normal breath sounds.  Abdominal:     Palpations: Abdomen is soft.     Tenderness: There is no abdominal tenderness.  Skin:    General: Skin is warm and dry.  Neurological:     Mental Status: He is alert.     ED Results / Procedures / Treatments   Labs (all labs ordered are listed, but only abnormal results are displayed) Labs Reviewed  BASIC METABOLIC PANEL - Abnormal; Notable for the following components:      Result Value   Glucose, Bld 113 (*)    Creatinine, Ser 0.59 (*)    All other components within normal limits  TROPONIN I (HIGH SENSITIVITY) - Abnormal; Notable for  the following components:   Troponin I (High Sensitivity) 25 (*)    All other components within normal limits  TROPONIN I (HIGH SENSITIVITY) - Abnormal; Notable for the following components:   Troponin I (High Sensitivity) 26 (*)    All other components within normal limits  CBC    EKG EKG Interpretation  Date/Time:  Tuesday July 27 2021 10:57:03 EDT Ventricular Rate:  74 PR Interval:  162 QRS Duration: 104 QT Interval:  378 QTC Calculation: 419 R Axis:   -53 Text Interpretation: Normal sinus rhythm Indeterminate axis Borderline ECG no acute STEMI confirmed with Maryelizabeth Rowan by Madalyn Rob [phone]) on 07/27/2021 11:28:48 AM  Radiology DG Chest 1 View  Result Date: 07/27/2021 CLINICAL DATA:  Chest pain EXAM: CHEST  1 VIEW COMPARISON:  07/17/2018 FINDINGS: The heart size and mediastinal contours are within normal limits. Both lungs are clear. The visualized skeletal structures are unremarkable. IMPRESSION: No active disease. Electronically Signed   By: Elmer Picker M.D.   On: 07/27/2021 11:13    Procedures Procedures    Medications Ordered in ED Medications  aspirin chewable tablet 324 mg (324 mg Oral Given 07/27/21 1236)  nitroGLYCERIN (NITROSTAT) SL tablet 0.4 mg (0.4 mg Sublingual Given 07/27/21 1238)    ED Course/ Medical Decision Making/ A&P Clinical Course as of 07/27/21 1454  Tue Jul 27, 2021  1255 I discussed with Trish from cardiology. Will send NP down to come eval [SG]    Clinical Course User Index [SG] Sherwood Gambler, MD                           Medical Decision Making Amount and/or Complexity of Data Reviewed External Data Reviewed: notes.    Details: Heart catheterization with LAD stent placed on 6/9 Labs: ordered.    Details: Troponins mildly elevated at 25 and 26, much improved from the STEMI Radiology: ordered and independent interpretation performed.    Details: No pneumothorax seen on my interpretation ECG/medicine tests: ordered  and independent interpretation performed.    Details: Prior infarct evolution seen, no acute STEMI   Patient's pain is unchanged from nitros.  ECG without acute STEMI.  As above he has flat but mildly elevated troponins, probably residual from his previous STEMI about a week and a half ago.  Otherwise, I discussed with cardiology and they have consulted.  Harlan Stains is seen patient and cardiology recommends discharge as they  feel comfortable with him being discharged and following up in 2 days, unlikely to be an acute cardiac emergency.  Doubt dissection or PE.  Will discharge home with return precautions.        Final Clinical Impression(s) / ED Diagnoses Final diagnoses:  Nonspecific chest pain    Rx / DC Orders ED Discharge Orders     None         Sherwood Gambler, MD 07/27/21 1456

## 2021-07-27 NOTE — ED Notes (Signed)
Pt verbalized understanding of d/c instructions, follow up and medications.  Pt ambulatory with family to [place], NAD.

## 2021-07-27 NOTE — Discharge Instructions (Signed)
Keep your appointment as scheduled on 6/22 with cardiology.  If you develop recurrent, continued, or worsening chest pain, shortness of breath, fever, vomiting, abdominal or back pain, or any other new/concerning symptoms then return to the ER for evaluation.

## 2021-07-27 NOTE — ED Provider Notes (Signed)
Brief update note  51 year old gentleman with recent MI, LHC on 6/09 and stent placed in LAD. Now with CP. Currently 5/10. Well appearing, vitals stable. EKG with subtle st changes noted in inferior leads. I discussed case with Ellyn Hack on STEMI call. He reviewed today's ekgs and prior ekgs. He feels the subtle changes are expected evolution of his prior MI and do not represent acute STEMI right now. Will continue workup, have ordered ASA and nitro for pt.     Lucrezia Starch, MD 07/27/21 1128
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
## 2021-07-27 NOTE — ED Triage Notes (Signed)
Pt states he woke up with chest pain this morning. Denies n/v/sob. Pain radiates towards his shoulder blades.

## 2021-07-29 ENCOUNTER — Ambulatory Visit: Payer: BC Managed Care – PPO | Admitting: Physician Assistant

## 2021-07-30 ENCOUNTER — Encounter: Payer: Self-pay | Admitting: Adult Health

## 2021-07-30 ENCOUNTER — Ambulatory Visit: Payer: BC Managed Care – PPO | Admitting: Adult Health

## 2021-07-30 VITALS — BP 132/70 | HR 70 | Ht 70.0 in | Wt 201.0 lb

## 2021-07-30 DIAGNOSIS — Z720 Tobacco use: Secondary | ICD-10-CM | POA: Diagnosis not present

## 2021-07-30 DIAGNOSIS — I2510 Atherosclerotic heart disease of native coronary artery without angina pectoris: Secondary | ICD-10-CM | POA: Diagnosis not present

## 2021-07-30 DIAGNOSIS — E7800 Pure hypercholesterolemia, unspecified: Secondary | ICD-10-CM | POA: Diagnosis not present

## 2021-07-30 MED ORDER — TICAGRELOR 90 MG PO TABS: 90.0000 mg | ORAL_TABLET | Freq: Two times a day (BID) | ORAL | 0 refills | Status: DC

## 2021-08-03 ENCOUNTER — Encounter: Payer: Self-pay | Admitting: Internal Medicine

## 2021-08-04 ENCOUNTER — Ambulatory Visit: Payer: BC Managed Care – PPO | Admitting: Family Medicine

## 2021-08-04 ENCOUNTER — Encounter: Payer: Self-pay | Admitting: Family Medicine

## 2021-08-04 VITALS — BP 110/66 | HR 68 | Ht 70.0 in | Wt 202.6 lb

## 2021-08-04 DIAGNOSIS — H9313 Tinnitus, bilateral: Secondary | ICD-10-CM | POA: Diagnosis not present

## 2021-08-04 DIAGNOSIS — Z0001 Encounter for general adult medical examination with abnormal findings: Secondary | ICD-10-CM | POA: Diagnosis not present

## 2021-08-04 DIAGNOSIS — E559 Vitamin D deficiency, unspecified: Secondary | ICD-10-CM | POA: Diagnosis not present

## 2021-08-04 DIAGNOSIS — R7301 Impaired fasting glucose: Secondary | ICD-10-CM

## 2021-08-04 DIAGNOSIS — Z23 Encounter for immunization: Secondary | ICD-10-CM

## 2021-08-04 DIAGNOSIS — Z1159 Encounter for screening for other viral diseases: Secondary | ICD-10-CM | POA: Diagnosis not present

## 2021-08-04 DIAGNOSIS — Z720 Tobacco use: Secondary | ICD-10-CM

## 2021-08-04 NOTE — Patient Instructions (Addendum)
I appreciate the opportunity to provide care to you today!    Follow up:  3 months  Labs: please stop by the lab during the week to get your blood drawn (CBC, CMP, TSH, Lipid profile, HgA1c, Vit D)  Screening: Hep C   Thank you for getting your Tdap and Shingles vaccine  Referrals today-  ENT   Please continue to a heart-healthy diet and increase your physical activities. Try to exercise for 31mns at least three times a week.      It was a pleasure to see you and I look forward to continuing to work together on your health and well-being. Please do not hesitate to call the office if you need care or have questions about your care.   Have a wonderful day and week. With Gratitude, GAlvira MondayMSN, FNP-BC

## 2021-08-04 NOTE — Progress Notes (Signed)
o  New Patient Office Visit  Subjective:  Patient ID: Derrick Turner, male    DOB: [birth year]  Age: 51 y.o. MRN: [id number]  CC:  Chief Complaint  Patient presents with   New Patient (Initial Visit)    Pt will be establishing care, c/o ringing in ears for the past years, states he had a heart attack around 07/16/2021.     HPI Derrick Turner is a 51 y.o. male with past medical history of CAD, ACS presents for establishing care with c/o of ringing in the ear bilaterally. He has not had a PCP for 6 years. -a chronic symptom of tinnitus for 3-4 years - describes symptom as a ringing and buzzing -symptoms are constant; nothing improves his symptom, but he notes that shooting his gun worsens the ringing in his ears -Ringing is loudest when shooting his gun -symptoms occurrences were in both ears simultaneously -Listened to loud music as a teenager -Reports wearing headphones most times when shooting his gun, and sometimes  he doesn't - Chronic hx of smoking but notes to be cutting back - He used to smoke 1 1/2 ppd but is cutting back to 8 cigarettes daily  -He denies hearing loss, loss of balance, and headaches -no ototoxic drugs reported -No ear infections were reported -He reports quitting his job in May 2023 due to excessive stress and had a MI on 07/27/21 - No symptom of MI reported since being seen in the ED on 07/27/21     Past Medical History:  Diagnosis Date   Coronary artery disease cardiologist--- dr croitoru   ETT 04-01-2016 in epic, reproduced chest pain but no EKG changes;  cardiac cath 04-06-2016 moderate diffuse nonobstructive disease, aggressive medical management   History of 2019 novel coronavirus disease (COVID-19) 03/02/2020   per pt tested positive covid, copy of result in epic, stated mild symptoms that resolved   History of kidney stones    Incomplete right bundle branch block    Right ureteral calculus    Wears glasses     Past Surgical History:   Procedure Laterality Date   APPENDECTOMY  teen   BACK SURGERY     two   BIOPSY  08/11/2020   Procedure: BIOPSY;  Surgeon: Eloise Harman, DO;  Location: [facility];  Service: Endoscopy;;   COLONOSCOPY WITH PROPOFOL N/A 08/11/2020   Procedure: COLONOSCOPY WITH PROPOFOL;  Surgeon: Eloise Harman, DO;  Location: [facility];  Service: Endoscopy;  Laterality: N/A;  7:30am   CORONARY/GRAFT ACUTE MI REVASCULARIZATION N/A 07/16/2021   Procedure: Coronary/Graft Acute MI Revascularization;  Surgeon: Burnell Blanks, MD;  Location: [place] [facility];  Service: Cardiovascular;  Laterality: N/A;   CYSTOSCOPY WITH RETROGRADE PYELOGRAM, URETEROSCOPY AND STENT PLACEMENT Right 04/14/2020   Procedure: CYSTOSCOPY WITH RETROGRADE PYELOGRAM, URETEROSCOPY AND STENT PLACEMENT;  Surgeon: Remi Haggard, MD;  Location[facility];  Service: Urology;  Laterality: Right;   EXTRACORPOREAL SHOCK WAVE LITHOTRIPSY Right 03/23/2020   Procedure: EXTRACORPOREAL SHOCK WAVE LITHOTRIPSY (ESWL);  Surgeon: Lucas Mallow, MD;  Location[facility];  Service: Urology;  Laterality: Right;   HOLMIUM LASER APPLICATION Right 09/73/5329   Procedure: HOLMIUM LASER APPLICATION;  Surgeon: Remi Haggard, MD;  Location: [facility];  Service: Urology;  Laterality: Right;   LAPAROSCOPIC CHOLECYSTECTOMY  2012 approx   LEFT HEART CATH AND CORONARY ANGIOGRAPHY N/A 04/06/2016   Procedure: Left Heart Cath and Coronary Angiography;  Surgeon: Sherren Mocha,  MD;  Location: [place] [facility];  Service: Cardiovascular;  Laterality: N/A;   LEFT HEART CATH AND CORONARY ANGIOGRAPHY N/A 07/16/2021   Procedure: LEFT HEART CATH AND CORONARY ANGIOGRAPHY;  Surgeon: Burnell Blanks, MD;  Location: [place] [facility];  Service: Cardiovascular;  Laterality: N/A;   ORCHIECTOMY Right 1990s   per pt benign cyst   PARTIAL COLECTOMY Right 09/02/2020   Procedure: HEMICOLECTOMY;  Surgeon:  Aviva Signs, MD;  Location: [facility];  Service: General;  Laterality: Right;   POLYPECTOMY  08/11/2020   Procedure: POLYPECTOMY;  Surgeon: Eloise Harman, DO;  Location: [facility];  Service: Endoscopy;;   SHOULDER SURGERY Bilateral left 2019;  right 2016   SHOULDER SURGERY Left    SHOULDER SURGERY Right     Family History  Problem Relation Age of Onset   Heart attack Father    Heart attack Maternal Grandmother    Heart attack Maternal Grandfather    Colon cancer Neg Hx    Colon polyps Neg Hx     Social History   Socioeconomic History   Marital status: Married    Spouse name: Not on file   Number of children: Not on file   Years of education: Not on file   Highest education level: Not on file  Occupational History   Not on file  Tobacco Use   Smoking status: Every Day    Packs/day: 1.50    Years: 35.00    Total pack years: 52.50    Types: Cigarettes   Smokeless tobacco: Never  Vaping Use   Vaping Use: Never used  Substance and Sexual Activity   Alcohol use: Yes    Comment: occasional   Drug use: Never   Sexual activity: Not on file  Other Topics Concern   Not on file  Social History Narrative   Not on file   Social Determinants of Health   Financial Resource Strain: Not on file  Food Insecurity: No Food Insecurity (07/17/2021)   Hunger Vital Sign    Worried About Running Out of Food in the Last Year: Never true    Ran Out of Food in the Last Year: Never true  Transportation Needs: No Transportation Needs (07/17/2021)   PRAPARE - Hydrologist (Medical): No    Lack of Transportation (Non-Medical): No  Physical Activity: Not on file  Stress: Not on file  Social Connections: Not on file  Intimate Partner Violence: Not on file    ROS Review of Systems  Constitutional:  Negative for chills, fatigue and fever.  HENT:  Positive for tinnitus. Negative for congestion, postnasal drip, rhinorrhea, sinus pressure, sinus pain and sore  throat.   Eyes:  Negative for redness.  Respiratory:  Negative for cough, choking, chest tightness, shortness of breath and wheezing.   Cardiovascular:  Negative for chest pain and palpitations.  Gastrointestinal:  Negative for constipation, diarrhea, nausea and vomiting.  Endocrine: Negative for polydipsia, polyphagia and polyuria.  Genitourinary:  Negative for dysuria, frequency and urgency.  Musculoskeletal:  Negative for neck stiffness.  Skin:  Negative for rash.  Neurological:  Negative for dizziness, tremors, syncope, facial asymmetry, speech difficulty, weakness, numbness and headaches.  Psychiatric/Behavioral:  Negative for confusion, self-injury and suicidal ideas.     Objective:   Today's Vitals: BP 110/66   Pulse 68   Ht $R'5\' 10"'Rh$  (1.778 m)   Wt 202 lb 9.6 oz (91.9 kg)   SpO2 95%   BMI 29.07 kg/m  Physical Exam HENT:     Head: Normocephalic.     Right Ear: External ear normal.     Left Ear: External ear normal.     Nose: No congestion.     Mouth/Throat:     Mouth: Mucous membranes are moist.  Eyes:     Extraocular Movements: Extraocular movements intact.     Pupils: Pupils are equal, round, and reactive to light.  Cardiovascular:     Rate and Rhythm: Normal rate and regular rhythm.     Pulses: Normal pulses.     Heart sounds: Normal heart sounds.  Pulmonary:     Breath sounds: Wheezing present.  Abdominal:     Palpations: Abdomen is soft.  Musculoskeletal:     Cervical back: No rigidity.     Right lower leg: No edema.     Left lower leg: No edema.  Skin:    General: Skin is warm.     Findings: No lesion or rash.  Neurological:     Mental Status: He is alert and oriented to person, place, and time.     Assessment & Plan:   Problem List Items Addressed This Visit       Other   Tobacco abuse    He used to smoke 1 1/2 ppd but is cutting back to 8 cigarettes daily  Smokes about 8 cigarettes daily  Asked about quitting: confirms that he currently  smokes cigarettes Advise to quit smoking: Educated about QUITTING to reduce the risk of cancer, cardio and cerebrovascular disease. Assess willingness: willing to quit at this time and is working on cutting back. Assist with counseling and pharmacotherapy: Counseled for 5 minutes and literature provided. Arrange for follow up: follow up in 3 months and continue to offer help.      Tinnitus, bilateral - Primary    -Chronic symptom of tinnitus for 3-4 years - Describes symptom as a ringing and buzzing -Symptoms are constant; nothing improves his symptom, but he notes that shooting his gun worsens the ringing in his ears -Ringing is loudest when shooting his gun - Given patient's reported symptoms, will refer him to ENT for further evaluation       Relevant Orders   Ambulatory referral to ENT   Other Visit Diagnoses     Need for shingles vaccine       Relevant Orders   Varicella-zoster vaccine IM (Shingrix) (Completed)   Need for diphtheria-tetanus-pertussis (Tdap) vaccine       Relevant Orders   Tdap vaccine greater than or equal to 7yo IM (Completed)   Need for hepatitis C screening test       Relevant Orders   Hepatitis C Antibody   Vitamin D deficiency       Relevant Orders   Vitamin D (25 hydroxy)   IFG (impaired fasting glucose)       Relevant Orders   Hemoglobin A1C   Encounter for general adult medical examination with abnormal findings       Relevant Orders   CBC with Differential/Platelet   CMP14+EGFR   TSH + free T4   Lipid Profile       Outpatient Encounter Medications as of 08/04/2021  Medication Sig   aspirin EC 81 MG tablet Take 1 tablet (81 mg total) by mouth daily. Swallow whole.   atorvastatin (LIPITOR) 80 MG tablet Take 1 tablet (80 mg total) by mouth daily.   cetirizine (ZYRTEC) 10 MG tablet Take 10 mg by mouth daily.   docusate  sodium (COLACE) 100 MG capsule Take 100 mg by mouth at bedtime.   Melatonin 10 MG TABS Take 20 mg by mouth at bedtime.    nitroGLYCERIN (NITROSTAT) 0.4 MG SL tablet Place 1 tablet (0.4 mg total) under the tongue every 5 (five) minutes as needed for chest pain.   Probiotic Product (PROBIOTIC PO) Take 1 capsule by mouth daily.   ticagrelor (BRILINTA) 90 MG TABS tablet Take 1 tablet (90 mg total) by mouth 2 (two) times daily.   ondansetron (ZOFRAN) 4 MG tablet Take 1 tablet (4 mg total) by mouth every 6 (six) hours as needed for nausea or vomiting. (Patient not taking: Reported on 08/04/2021)   No facility-administered encounter medications on file as of 08/04/2021.    Follow-up: Return in about 3 months (around 11/04/2021).   Alvira Monday, FNP

## 2021-08-04 NOTE — Assessment & Plan Note (Signed)
-  Chronic symptom of tinnitus for 3-4 years - Describes symptom as a ringing and buzzing -Symptoms are constant; nothing improves his symptom, but he notes that shooting his gun worsens the ringing in his ears -Ringing is loudest when shooting his gun - Given patient's reported symptoms, will refer him to ENT for further evaluation

## 2021-08-04 NOTE — Assessment & Plan Note (Signed)
He used to smoke 1 1/2 ppd but is cutting back to 8 cigarettes daily  Smokes about 8 cigarettes daily  Asked about quitting: confirms that he currently smokes cigarettes Advise to quit smoking: Educated about QUITTING to reduce the risk of cancer, cardio and cerebrovascular disease. Assess willingness: willing to quit at this time and is working on cutting back. Assist with counseling and pharmacotherapy: Counseled for 5 minutes and literature provided. Arrange for follow up: follow up in 3 months and continue to offer help.

## 2021-08-06 ENCOUNTER — Encounter (HOSPITAL_COMMUNITY): Payer: Self-pay

## 2021-08-06 ENCOUNTER — Encounter (HOSPITAL_COMMUNITY)
Admission: RE | Admit: 2021-08-06 | Discharge: 2021-08-06 | Disposition: A | Discharge status: Home / Self Care | Payer: BC Managed Care – PPO | Source: Ambulatory Visit | Attending: Cardiovascular Disease | Admitting: Cardiovascular Disease

## 2021-08-06 VITALS — BP 108/64 | HR 80 | Ht 70.0 in | Wt 205.5 lb

## 2021-08-06 DIAGNOSIS — Z955 Presence of coronary angioplasty implant and graft: Secondary | ICD-10-CM | POA: Insufficient documentation

## 2021-08-06 DIAGNOSIS — Z9861 Coronary angioplasty status: Secondary | ICD-10-CM | POA: Diagnosis present

## 2021-08-06 DIAGNOSIS — I2102 ST elevation (STEMI) myocardial infarction involving left anterior descending coronary artery: Secondary | ICD-10-CM | POA: Diagnosis present

## 2021-08-06 NOTE — Progress Notes (Signed)
Cardiac Individual Treatment Plan  Patient Details  Name: Derrick Turner MRN: [id number] Date of Birth: [birth year] Referring Provider:   Flowsheet Row CARDIAC REHAB PHASE II ORIENTATION from 08/06/2021 in [facility] [facility]  Referring Provider Dr. Angelena Form       Initial Encounter Date:  Flowsheet Row CARDIAC REHAB PHASE II ORIENTATION from 08/06/2021 in [facility]  Date 08/06/21       Visit Diagnosis: ST elevation myocardial infarction involving left anterior descending (LAD) coronary artery (HCC)  S/P PTCA (percutaneous transluminal coronary angioplasty)  Status post coronary artery stent placement  Patient's Home Medications on Admission:  Current Outpatient Medications:    acetaminophen (TYLENOL) 500 MG tablet, Take 1,000-1,500 mg by mouth daily as needed (headache/pain.)., Disp: , Rfl:    aspirin EC 81 MG tablet, Take 1 tablet (81 mg total) by mouth daily. Swallow whole., Disp: 150 tablet, Rfl: 2   atorvastatin (LIPITOR) 40 MG tablet, Take 80 mg by mouth in the morning., Disp: , Rfl:    cetirizine (ZYRTEC) 10 MG tablet, Take 10 mg by mouth daily., Disp: , Rfl:    docusate sodium (COLACE) 100 MG capsule, Take 100 mg by mouth at bedtime., Disp: , Rfl:    Melatonin 10 MG TABS, Take 20 mg by mouth at bedtime., Disp: , Rfl:    nitroGLYCERIN (NITROSTAT) 0.4 MG SL tablet, Place 1 tablet (0.4 mg total) under the tongue every 5 (five) minutes as needed for chest pain., Disp: 25 tablet, Rfl: 3   Probiotic Product (PROBIOTIC PO), Take 1 capsule by mouth daily., Disp: , Rfl:    ticagrelor (BRILINTA) 90 MG TABS tablet, Take 1 tablet (90 mg total) by mouth 2 (two) times daily., Disp: 32 tablet, Rfl: 0   atorvastatin (LIPITOR) 80 MG tablet, Take 1 tablet (80 mg total) by mouth daily., Disp: 90 tablet, Rfl: 3   ondansetron (ZOFRAN) 4 MG tablet, Take 1 tablet (4 mg total) by mouth every 6 (six) hours as needed for nausea or vomiting. (Patient not taking:  Reported on 08/04/2021), Disp: 20 tablet, Rfl: 0  Past Medical History: Past Medical History:  Diagnosis Date   Coronary artery disease cardiologist--- dr croitoru   ETT 04-01-2016 in epic, reproduced chest pain but no EKG changes;  cardiac cath 04-06-2016 moderate diffuse nonobstructive disease, aggressive medical management   History of 2019 novel coronavirus disease (COVID-19) 03/02/2020   per pt tested positive covid, copy of result in epic, stated mild symptoms that resolved   History of kidney stones    Incomplete right bundle branch block    Right ureteral calculus    Wears glasses     Tobacco Use: Social History   Tobacco Use  Smoking Status Every Day   Packs/day: 1.50   Years: 35.00   Total pack years: 52.50   Types: Cigarettes  Smokeless Tobacco Never    Labs: Review Flowsheet       Latest Ref Rng & Units 03/31/2016 04/01/2016 07/26/2019 07/17/2021  Labs for ITP Cardiac and Pulmonary Rehab  Cholestrol 0 - 200 mg/dL - 147  166  78   LDL (calc) 0 - 99 mg/dL - 105  121  26   HDL-C >40 mg/dL - 30  30  35   Trlycerides <150 mg/dL - 61  79  84   Hemoglobin A1c 4.8 - 5.6 % 5.7  - - -    Capillary Blood Glucose: Lab Results  Component Value Date   GLUCAP 152 (H) 09/03/2020  Exercise Target Goals: Exercise Program Goal: Individual exercise prescription set using results from initial 6 min walk test and THRR while considering  patient's activity barriers and safety.   Exercise Prescription Goal: Starting with aerobic activity 30 plus minutes a day, 3 days per week for initial exercise prescription. Provide home exercise prescription and guidelines that participant acknowledges understanding prior to discharge.  Activity Barriers & Risk Stratification:  Activity Barriers & Cardiac Risk Stratification - 08/06/21 1313       Activity Barriers & Cardiac Risk Stratification   Activity Barriers Arthritis;Back Problems;Joint Problems    Cardiac Risk Stratification  High             6 Minute Walk:  6 Minute Walk     Row Name 08/06/21 1323         6 Minute Walk   Phase Initial     Distance 1200 feet     Walk Time 6 minutes     # of Rest Breaks 0     MPH 2.27     METS 2.61     RPE 11     VO2 Peak 9.14     Symptoms No     Resting HR 80 bpm     Resting BP 108/64     Resting Oxygen Saturation  97 %     Exercise Oxygen Saturation  during 6 min walk 98 %     Max Ex. HR 89 bpm     Max Ex. BP 122/60     2 Minute Post BP 108/60              Oxygen Initial Assessment:   Oxygen Re-Evaluation:   Oxygen Discharge (Final Oxygen Re-Evaluation):   Initial Exercise Prescription:  Initial Exercise Prescription - 08/06/21 1300       Date of Initial Exercise RX and Referring Provider   Date 08/06/21    Referring Provider Dr. Angelena Form    Expected Discharge Date 11/03/21      Treadmill   MPH 1.8    Grade 0    Minutes 17      NuStep   Level 1    SPM 60    Minutes 22      Prescription Details   Frequency (times per week) 3    Duration Progress to 10 minutes continuous walking  at current work load and total walking time to 30-45 min      Intensity   THRR 40-80% of Max Heartrate 67-134    Ratings of Perceived Exertion 11-13      Resistance Training   Training Prescription Yes    Weight 4    Reps 10-15             Perform Capillary Blood Glucose checks as needed.  Exercise Prescription Changes:   Exercise Comments:   Exercise Goals and Review:   Exercise Goals     Row Name 08/06/21 1322             Exercise Goals   Increase Physical Activity Yes       Intervention Provide advice, education, support and counseling about physical activity/exercise needs.;Develop an individualized exercise prescription for aerobic and resistive training based on initial evaluation findings, risk stratification, comorbidities and participant's personal goals.       Expected Outcomes Short Term: Attend rehab on a regular  basis to increase amount of physical activity.;Long Term: Add in home exercise to make exercise part of routine and to increase amount  of physical activity.;Long Term: Exercising regularly at least 3-5 days a week.       Increase Strength and Stamina Yes       Intervention Provide advice, education, support and counseling about physical activity/exercise needs.;Develop an individualized exercise prescription for aerobic and resistive training based on initial evaluation findings, risk stratification, comorbidities and participant's personal goals.       Expected Outcomes Short Term: Increase workloads from initial exercise prescription for resistance, speed, and METs.;Short Term: Perform resistance training exercises routinely during rehab and add in resistance training at home;Long Term: Improve cardiorespiratory fitness, muscular endurance and strength as measured by increased METs and functional capacity (6MWT)       Able to understand and use rate of perceived exertion (RPE) scale Yes       Intervention Provide education and explanation on how to use RPE scale       Expected Outcomes Short Term: Able to use RPE daily in rehab to express subjective intensity level;Long Term:  Able to use RPE to guide intensity level when exercising independently       Knowledge and understanding of Target Heart Rate Range (THRR) Yes       Intervention Provide education and explanation of THRR including how the numbers were predicted and where they are located for reference       Expected Outcomes Short Term: Able to state/look up THRR;Long Term: Able to use THRR to govern intensity when exercising independently;Short Term: Able to use daily as guideline for intensity in rehab       Able to check pulse independently Yes       Intervention Provide education and demonstration on how to check pulse in carotid and radial arteries.;Review the importance of being able to check your own pulse for safety during independent  exercise       Expected Outcomes Short Term: Able to explain why pulse checking is important during independent exercise;Long Term: Able to check pulse independently and accurately       Understanding of Exercise Prescription Yes       Intervention Provide education, explanation, and written materials on patient's individual exercise prescription       Expected Outcomes Short Term: Able to explain program exercise prescription;Long Term: Able to explain home exercise prescription to exercise independently                Exercise Goals Re-Evaluation :    Discharge Exercise Prescription (Final Exercise Prescription Changes):   Nutrition:  Target Goals: Understanding of nutrition guidelines, daily intake of sodium '1500mg'$ , cholesterol '200mg'$ , calories 30% from fat and 7% or less from saturated fats, daily to have 5 or more servings of fruits and vegetables.  Biometrics:  Pre Biometrics - 08/06/21 1312       Pre Biometrics   Height '5\' 10"'$  (1.778 m)    Weight 205 lb 7.5 oz (93.2 kg)    Waist Circumference 43 inches    Hip Circumference 41 inches    Waist to Hip Ratio 1.05 %    BMI (Calculated) 29.48    Triceps Skinfold 20 mm    % Body Fat 30.1 %    Grip Strength 38.3 kg    Flexibility 0 in    Single Leg Stand 27.13 seconds              Nutrition Therapy Plan and Nutrition Goals:  Nutrition Therapy & Goals - 08/06/21 1315       Intervention Plan   Intervention  Nutrition handout(s) given to patient.    Expected Outcomes Short Term Goal: Understand basic principles of dietary content, such as calories, fat, sodium, cholesterol and nutrients.             Nutrition Assessments:  MEDIFICTS Score Key: ?70 Need to make dietary changes  40-70 Heart Healthy Diet ? 40 Therapeutic Level Cholesterol Diet   Picture Your Plate Scores: <82 Unhealthy dietary pattern with much room for improvement. 41-50 Dietary pattern unlikely to meet recommendations for good health  and room for improvement. 51-60 More healthful dietary pattern, with some room for improvement.  >60 Healthy dietary pattern, although there may be some specific behaviors that could be improved.    Nutrition Goals Re-Evaluation:   Nutrition Goals Discharge (Final Nutrition Goals Re-Evaluation):   Psychosocial: Target Goals: Acknowledge presence or absence of significant depression and/or stress, maximize coping skills, provide positive support system. Participant is able to verbalize types and ability to use techniques and skills needed for reducing stress and depression.  Initial Review & Psychosocial Screening:  Initial Psych Review & Screening - 08/06/21 1313       Initial Review   Current issues with Current Sleep Concerns;Current Stress Concerns    Source of Stress Concerns Financial    Comments He is currently out of work, and he has a lot of bills occuring.      Family Dynamics   Good Support System? Yes    Comments His support system is his wife.      Barriers   Psychosocial barriers to participate in program The patient should benefit from training in stress management and relaxation.      Screening Interventions   Interventions Encouraged to exercise;Provide feedback about the scores to participant    Expected Outcomes Long Term Goal: Stressors or current issues are controlled or eliminated.;Short Term goal: Identification and review with participant of any Quality of Life or Depression concerns found by scoring the questionnaire.;Long Term goal: The participant improves quality of Life and PHQ9 Scores as seen by post scores and/or verbalization of changes             Quality of Life Scores:  Quality of Life - 08/06/21 1324       Quality of Life   Select Quality of Life      Quality of Life Scores   Health/Function Pre 20.5 %    Socioeconomic Pre 19.79 %    Psych/Spiritual Pre 20.5 %    Family Pre 22.8 %    GLOBAL Pre 21.72 %            Scores of  19 and below usually indicate a poorer quality of life in these areas.  A difference of  2-3 points is a clinically meaningful difference.  A difference of 2-3 points in the total score of the Quality of Life Index has been associated with significant improvement in overall quality of life, self-image, physical symptoms, and general health in studies assessing change in quality of life.  PHQ-9: Review Flowsheet       08/06/2021 08/04/2021  Depression screen PHQ 2/9  Decreased Interest 0 0  Down, Depressed, Hopeless 1 0  PHQ - 2 Score 1 0  Altered sleeping 2 -  Tired, decreased energy 1 -  Change in appetite 0 -  Feeling bad or failure about yourself  0 -  Trouble concentrating 0 -  Moving slowly or fidgety/restless 0 -  Suicidal thoughts 0 -  PHQ-9 Score 4 -  Difficult doing work/chores Not difficult at all -   Interpretation of Total Score  Total Score Depression Severity:  1-4 = Minimal depression, 5-9 = Mild depression, 10-14 = Moderate depression, 15-19 = Moderately severe depression, 20-27 = Severe depression   Psychosocial Evaluation and Intervention:  Psychosocial Evaluation - 08/06/21 1347       Psychosocial Evaluation & Interventions   Interventions Encouraged to exercise with the program and follow exercise prescription    Comments Pt has no barriers to participating in CR. He does have current sleep and stress concerns. He reports that he has difficulty falling asleep and staying asleep. He takes melatonin for this and reports that it does help some. He is also currently stressed about his finances. He had just quit his job before his heart attack, and now he has a lot of medical bills coming in, and he is still without a job. 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 He has requested a referral to vocational rehab so that they can explore other lines of work. He has no other identifiable  psychosocial issues. He scored a 4 on his PHQ-9. This relates to his sleep, his lack of energy since his heart attack, and feeling down several days per week. He reports that the last year and a half has been rough for him due to medical issues and the death of both of his parents. Both of his parents died, and reports that their decline was slow and that they both suffered over their final months. Last year he had issues with kidney stones and he had part of his small intestine removed. He reports that he has a good support system with his wife. He has cut back his cigarette smoking from 1.5 packs per day to 8 cigarettes per day. He is using nicotine gum to help him and is considering adding patches. He does not want to try medications like Chantix at this time due to the side effects. His cessation plan is to continue to wean himself down until he is no longer smoking at all. He is hopeful that he will be able to do this in a month. He states that he does not need to speak with a cessation councilor. His goals while in the program are to quit smoking, improve his heart functioning, get stronger, and to learn about his condition and nutrtion. He is eager to start the program and is interested in continuing to improve his health.    Expected Outcomes Pt's sleep issues will continue to be managed with melatonin, his stressors will be managed, and he will continue to have no other identifiable psychosocial issues.    Continue Psychosocial Services  No Follow up required             Psychosocial Re-Evaluation:   Psychosocial Discharge (Final Psychosocial Re-Evaluation):   Vocational Rehabilitation: Provide vocational rehab assistance to qualifying candidates.   Vocational Rehab Evaluation & Intervention:  Vocational Rehab - 08/06/21 1318       Initial Vocational Rehab Evaluation & Intervention   Assessment shows need for Vocational Rehabilitation Yes             Education: Education  Goals: Education classes will be provided on a weekly basis, covering required topics. Participant will state understanding/return demonstration of topics presented.  Learning Barriers/Preferences:  Learning Barriers/Preferences - 08/06/21 1318       Learning Barriers/Preferences  Learning Barriers None    Learning Preferences Written Material             Education Topics: Hypertension, Hypertension Reduction -Define heart disease and high blood pressure. Discus how high blood pressure affects the body and ways to reduce high blood pressure.   Exercise and Your Heart -Discuss why it is important to exercise, the FITT principles of exercise, normal and abnormal responses to exercise, and how to exercise safely.   Angina -Discuss definition of angina, causes of angina, treatment of angina, and how to decrease risk of having angina.   Cardiac Medications -Review what the following cardiac medications are used for, how they affect the body, and side effects that may occur when taking the medications.  Medications include Aspirin, Beta blockers, calcium channel blockers, ACE Inhibitors, angiotensin receptor blockers, diuretics, digoxin, and antihyperlipidemics.   Congestive Heart Failure -Discuss the definition of CHF, how to live with CHF, the signs and symptoms of CHF, and how keep track of weight and sodium intake.   Heart Disease and Intimacy -Discus the effect sexual activity has on the heart, how changes occur during intimacy as we age, and safety during sexual activity.   Smoking Cessation / COPD -Discuss different methods to quit smoking, the health benefits of quitting smoking, and the definition of COPD.   Nutrition I: Fats -Discuss the types of cholesterol, what cholesterol does to the heart, and how cholesterol levels can be controlled.   Nutrition II: Labels -Discuss the different components of food labels and how to read food label   Heart Parts/Heart  Disease and PAD -Discuss the anatomy of the heart, the pathway of blood circulation through the heart, and these are affected by heart disease.   Stress I: Signs and Symptoms -Discuss the causes of stress, how stress may lead to anxiety and depression, and ways to limit stress.   Stress II: Relaxation -Discuss different types of relaxation techniques to limit stress.   Warning Signs of Stroke / TIA -Discuss definition of a stroke, what the signs and symptoms are of a stroke, and how to identify when someone is having stroke.   Knowledge Questionnaire Score:  Knowledge Questionnaire Score - 08/06/21 1249       Knowledge Questionnaire Score   Pre Score 28/28             Core Components/Risk Factors/Patient Goals at Admission:  Personal Goals and Risk Factors at Admission - 08/06/21 1319       Core Components/Risk Factors/Patient Goals on Admission   Tobacco Cessation Yes    Number of packs per day 1/3    Intervention Assist the participant in steps to quit. Provide individualized education and counseling about committing to Tobacco Cessation, relapse prevention, and pharmacological support that can be provided by physician.;Advice worker, assist with locating and accessing local/national Quit Smoking programs, and support quit date choice.    Expected Outcomes Short Term: Will demonstrate readiness to quit, by selecting a quit date.;Short Term: Will quit all tobacco product use, adhering to prevention of relapse plan.;Long Term: Complete abstinence from all tobacco products for at least 12 months from quit date.    Lipids Yes    Intervention Provide education and support for participant on nutrition & aerobic/resistive exercise along with prescribed medications to achieve LDL '70mg'$ , HDL >'40mg'$ .    Expected Outcomes Short Term: Participant states understanding of desired cholesterol values and is compliant with medications prescribed. Participant is following  exercise prescription and nutrition guidelines.;Long Term:  Cholesterol controlled with medications as prescribed, with individualized exercise RX and with personalized nutrition plan. Value goals: LDL < '70mg'$ , HDL > 40 mg.    Personal Goal Other Yes    Personal Goal Improve heart function, improve stamina, learn about his condition and nutrition.    Intervention Attend CR three days per week, attend education sessions, and exercise at home 2-4 days per week.    Expected Outcomes Pt will meet his stated goals.             Core Components/Risk Factors/Patient Goals Review:    Core Components/Risk Factors/Patient Goals at Discharge (Final Review):    ITP Comments:   Comments: Patient arrived for 1st visit/orientation/education at 1230. Patient was referred to CR by Dr. Angelena Form due to STEMI involving LAD (I21.02); S/P PTCA (Z98.61); status post coronary artery stent placemen (Z95.1). During orientation advised patient on arrival and appointment times what to wear, what to do before, during and after exercise. Reviewed attendance and class policy.  Pt is scheduled to return Cardiac Rehab on 08/13/2021 at 1445. Pt was advised to come to class 15 minutes before class starts.  Discussed RPE/Dpysnea scales. Patient participated in warm up stretches. Patient was able to complete 6 minute walk test.  Telemetry: NSR. Patient was measured for the equipment. Discussed equipment safety with patient. Took patient pre-anthropometric measurements. Patient finished visit at 1330.

## 2021-08-13 ENCOUNTER — Encounter (HOSPITAL_COMMUNITY): Payer: BC Managed Care – PPO

## 2021-08-16 ENCOUNTER — Encounter (HOSPITAL_COMMUNITY)
Admission: RE | Admit: 2021-08-16 | Discharge: 2021-08-16 | Disposition: A | Discharge status: Home / Self Care | Payer: BC Managed Care – PPO | Source: Ambulatory Visit | Attending: Cardiovascular Disease | Admitting: Cardiovascular Disease

## 2021-08-16 DIAGNOSIS — Z955 Presence of coronary angioplasty implant and graft: Secondary | ICD-10-CM | POA: Diagnosis present

## 2021-08-16 DIAGNOSIS — Z9861 Coronary angioplasty status: Secondary | ICD-10-CM | POA: Diagnosis present

## 2021-08-16 DIAGNOSIS — I2102 ST elevation (STEMI) myocardial infarction involving left anterior descending coronary artery: Secondary | ICD-10-CM | POA: Insufficient documentation

## 2021-08-16 NOTE — Progress Notes (Signed)
Daily Session Note  Patient Details  Name: Derrick Turner MRN: [id number] Date of Birth: 12-May-1970 Referring Provider:   Flowsheet Row CARDIAC REHAB PHASE II ORIENTATION from 08/06/2021 in [facility]  Referring Provider Dr. Angelena Form       Encounter Date: 08/16/2021  Check In:  Session Check In - 08/16/21 1444       Check-In   Supervising physician immediately available to respond to emergencies [facility] MD immediately available    Physician(s) Dr. Harrington Challenger    Location [facility]    Staff Present Geanie Cooley, RN;Heather Otho Ket, BS, Exercise Physiologist;Dalton Kris Mouton, MS, ACSM-CEP, Exercise Physiologist    Virtual Visit No    Medication changes reported     No    Fall or balance concerns reported    No    Tobacco Cessation No Change    Warm-up and Cool-down Performed as group-led instruction    Resistance Training Performed Yes    VAD Patient? No    PAD/SET Patient? No      Pain Assessment   Currently in Pain? No/denies    Pain Score 0-No pain    Multiple Pain Sites No             Capillary Blood Glucose: No results found for this or any previous visit (from the past 24 hour(s)).    Social History   Tobacco Use  Smoking Status Every Day   Packs/day: 1.50   Years: 35.00   Total pack years: 52.50   Types: Cigarettes  Smokeless Tobacco Never    Goals Met:  Exercise tolerated well No report of concerns or symptoms today Strength training completed today  Goals Unmet:  Not Applicable  Comments: check out @ 3:45pm   Dr. Carlyle Dolly is Medical Director for [facility]

## 2021-08-18 ENCOUNTER — Encounter (HOSPITAL_COMMUNITY)
Admission: RE | Admit: 2021-08-18 | Discharge: 2021-08-18 | Disposition: A | Discharge status: Home / Self Care | Payer: BC Managed Care – PPO | Source: Ambulatory Visit | Attending: Cardiovascular Disease | Admitting: Cardiovascular Disease

## 2021-08-18 DIAGNOSIS — I2102 ST elevation (STEMI) myocardial infarction involving left anterior descending coronary artery: Secondary | ICD-10-CM

## 2021-08-18 DIAGNOSIS — Z9861 Coronary angioplasty status: Secondary | ICD-10-CM

## 2021-08-18 DIAGNOSIS — Z955 Presence of coronary angioplasty implant and graft: Secondary | ICD-10-CM

## 2021-08-18 NOTE — Progress Notes (Signed)
Daily Session Note  Patient Details  Name: Derrick Turner MRN: [id number] Date of Birth: 01-Aug-1970 Referring Provider:   Flowsheet Row CARDIAC REHAB PHASE II ORIENTATION from 08/06/2021 in [facility]  Referring Provider Dr. Angelena Form       Encounter Date: 08/18/2021  Check In:  Session Check In - 08/18/21 1445       Check-In   Supervising physician immediately available to respond to emergencies [facility] MD immediately available    Physician(s) Dr Audie Box    Location [facility]    Staff Present Redge Gainer, BS, Exercise Physiologist;Dalton Kris Mouton, MS, ACSM-CEP, Exercise Physiologist;Debra Wynetta Emery, RN, BSN    Virtual Visit No    Medication changes reported     No    Fall or balance concerns reported    No    Tobacco Cessation No Change    Warm-up and Cool-down Performed as group-led instruction    Resistance Training Performed Yes    VAD Patient? No    PAD/SET Patient? No      Pain Assessment   Currently in Pain? No/denies    Pain Score 0-No pain    Multiple Pain Sites No             Capillary Blood Glucose: No results found for this or any previous visit (from the past 24 hour(s)).    Social History   Tobacco Use  Smoking Status Every Day   Packs/day: 1.50   Years: 35.00   Total pack years: 52.50   Types: Cigarettes  Smokeless Tobacco Never    Goals Met:  Independence with exercise equipment Exercise tolerated well No report of concerns or symptoms today Strength training completed today  Goals Unmet:  Not Applicable  Comments: Checkout at 1545.   Dr. Carlyle Dolly is Medical Director for [facility]

## 2021-08-20 ENCOUNTER — Encounter (HOSPITAL_COMMUNITY)
Admission: RE | Admit: 2021-08-20 | Discharge: 2021-08-20 | Disposition: A | Discharge status: Home / Self Care | Payer: BC Managed Care – PPO | Source: Ambulatory Visit | Attending: Cardiovascular Disease | Admitting: Cardiovascular Disease

## 2021-08-20 DIAGNOSIS — I2102 ST elevation (STEMI) myocardial infarction involving left anterior descending coronary artery: Secondary | ICD-10-CM

## 2021-08-20 DIAGNOSIS — Z955 Presence of coronary angioplasty implant and graft: Secondary | ICD-10-CM

## 2021-08-20 DIAGNOSIS — Z9861 Coronary angioplasty status: Secondary | ICD-10-CM

## 2021-08-20 NOTE — Progress Notes (Signed)
Daily Session Note  Patient Details  Name: Derrick Turner MRN: [id number] Date of Birth: December 19, 1970 Referring Provider:   Flowsheet Row CARDIAC REHAB PHASE II ORIENTATION from 08/06/2021 in [place]  Referring Provider Dr. Angelena Form       Encounter Date: 08/20/2021  Check In:  Session Check In - 08/20/21 1437       Check-In   Supervising physician immediately available to respond to emergencies [facility] MD immediately available    Physician(s) Dr. Radford Pax    Location [facility]    Staff Present Aundra Dubin, RN, Bjorn Loser, MS, ACSM-CEP, Exercise Physiologist;Heather Zigmund Daniel, Exercise Physiologist    Virtual Visit No    Medication changes reported     No    Fall or balance concerns reported    No    Tobacco Cessation No Change    Warm-up and Cool-down Performed as group-led instruction    Resistance Training Performed Yes    VAD Patient? No    PAD/SET Patient? No      Pain Assessment   Currently in Pain? No/denies    Pain Score 0-No pain    Multiple Pain Sites No             Capillary Blood Glucose: No results found for this or any previous visit (from the past 24 hour(s)).    Social History   Tobacco Use  Smoking Status Every Day   Packs/day: 1.50   Years: 35.00   Total pack years: 52.50   Types: Cigarettes  Smokeless Tobacco Never    Goals Met:  Independence with exercise equipment Exercise tolerated well No report of concerns or symptoms today Strength training completed today  Goals Unmet:  Not Applicable  Comments: Check out 1545.   Dr. Carlyle Dolly is Medical Director for [facility]

## 2021-08-20 NOTE — Progress Notes (Signed)
I have reviewed a Home Exercise Prescription with [name] . Derrick Turner is currently exercising at home.  The patient was advised to walk 2 days a week for 30-45 minutes.  [name] and I discussed how to progress their exercise prescription.  The patient stated that their goals were lose weight and build back his endurance.  The patient stated that they understand the exercise prescription.  We reviewed exercise guidelines, target heart rate during exercise, RPE Scale, weather conditions, NTG use, endpoints for exercise, warmup and cool down.  Patient is encouraged to come to me with any questions. I will continue to follow up with the patient to assist them with progression and safety.

## 2021-08-23 ENCOUNTER — Encounter (HOSPITAL_COMMUNITY)
Admission: RE | Admit: 2021-08-23 | Discharge: 2021-08-23 | Disposition: A | Discharge status: Home / Self Care | Payer: BC Managed Care – PPO | Source: Ambulatory Visit | Attending: Cardiovascular Disease | Admitting: Cardiovascular Disease

## 2021-08-23 VITALS — Wt 203.9 lb

## 2021-08-23 DIAGNOSIS — Z955 Presence of coronary angioplasty implant and graft: Secondary | ICD-10-CM

## 2021-08-23 DIAGNOSIS — I2102 ST elevation (STEMI) myocardial infarction involving left anterior descending coronary artery: Secondary | ICD-10-CM

## 2021-08-23 DIAGNOSIS — Z9861 Coronary angioplasty status: Secondary | ICD-10-CM

## 2021-08-23 NOTE — Progress Notes (Signed)
Daily Session Note  Patient Details  Name: Derrick Turner MRN: [id number] Date of Birth: April 27, 1970 Referring Provider:   Flowsheet Row CARDIAC REHAB PHASE II ORIENTATION from 08/06/2021 in [place]  Referring Provider Dr. Angelena Form       Encounter Date: 08/23/2021  Check In:  Session Check In - 08/23/21 1452       Check-In   Supervising physician immediately available to respond to emergencies [facility] MD immediately available    Physician(s) Dr Phineas Inches    Location [facility]    Staff Present Redge Gainer, BS, Exercise Physiologist;Dona Walby Wynetta Emery, RN, BSN    Virtual Visit No    Medication changes reported     No    Fall or balance concerns reported    No    Tobacco Cessation No Change    Warm-up and Cool-down Performed as group-led instruction    Resistance Training Performed Yes    VAD Patient? No    PAD/SET Patient? No      Pain Assessment   Currently in Pain? No/denies    Pain Score 0-No pain    Multiple Pain Sites No             Capillary Blood Glucose: No results found for this or any previous visit (from the past 24 hour(s)).    Social History   Tobacco Use  Smoking Status Every Day   Packs/day: 1.50   Years: 35.00   Total pack years: 52.50   Types: Cigarettes  Smokeless Tobacco Never    Goals Met:  Independence with exercise equipment Exercise tolerated well No report of concerns or symptoms today Strength training completed today  Goals Unmet:  Not Applicable  Comments: Check out 1545.   Dr. Carlyle Dolly is Medical Director for [facility]

## 2021-08-25 ENCOUNTER — Encounter (HOSPITAL_COMMUNITY)
Admission: RE | Admit: 2021-08-25 | Discharge: 2021-08-25 | Disposition: A | Discharge status: Home / Self Care | Payer: BC Managed Care – PPO | Source: Ambulatory Visit | Attending: Cardiovascular Disease | Admitting: Cardiovascular Disease

## 2021-08-25 DIAGNOSIS — Z955 Presence of coronary angioplasty implant and graft: Secondary | ICD-10-CM

## 2021-08-25 DIAGNOSIS — I2102 ST elevation (STEMI) myocardial infarction involving left anterior descending coronary artery: Secondary | ICD-10-CM | POA: Diagnosis not present

## 2021-08-25 DIAGNOSIS — Z9861 Coronary angioplasty status: Secondary | ICD-10-CM

## 2021-08-25 NOTE — Progress Notes (Signed)
Daily Session Note  Patient Details  Name: Derrick Turner MRN: [id number] Date of Birth: 04-Dec-1970 Referring Provider:   Flowsheet Row CARDIAC REHAB PHASE II ORIENTATION from 08/06/2021 in [place]  Referring Provider Dr. Angelena Form       Encounter Date: 08/25/2021  Check In:  Session Check In - 08/25/21 1438       Check-In   Supervising physician immediately available to respond to emergencies [facility] MD immediately available    Physician(s) Dr Zandra Abts    Location [facility]    Staff Present Redge Gainer, BS, Exercise Physiologist;Henri Baumler Hassell Done, RN, Bjorn Loser, MS, ACSM-CEP, Exercise Physiologist    Virtual Visit No    Medication changes reported     No    Fall or balance concerns reported    No    Tobacco Cessation No Change    Warm-up and Cool-down Performed as group-led instruction    Resistance Training Performed Yes    VAD Patient? No    PAD/SET Patient? No      Pain Assessment   Currently in Pain? No/denies    Pain Score 0-No pain    Multiple Pain Sites No             Capillary Blood Glucose: No results found for this or any previous visit (from the past 24 hour(s)).    Social History   Tobacco Use  Smoking Status Every Day   Packs/day: 1.50   Years: 35.00   Total pack years: 52.50   Types: Cigarettes  Smokeless Tobacco Never    Goals Met:  Independence with exercise equipment Exercise tolerated well No report of concerns or symptoms today Strength training completed today  Goals Unmet:  Not Applicable  Comments: Check out at 1545.   Dr. Carlyle Dolly is Medical Director for [facility]

## 2021-08-25 NOTE — Progress Notes (Signed)
Cardiac Individual Treatment Plan  Patient Details  Name: Derrick Turner MRN: [id number] Date of Birth: Apr 11, 1970 Referring Provider:   Flowsheet Row CARDIAC REHAB PHASE II ORIENTATION from 08/06/2021 in [place]  Referring Provider Dr. Angelena Form       Initial Encounter Date:  Flowsheet Row CARDIAC REHAB PHASE II ORIENTATION from 08/06/2021 in [place]  Date 08/06/21       Visit Diagnosis: ST elevation myocardial infarction involving left anterior descending (LAD) coronary artery (HCC)  S/P PTCA (percutaneous transluminal coronary angioplasty)  Status post coronary artery stent placement  Patient's Home Medications on Admission:  Current Outpatient Medications:    acetaminophen (TYLENOL) 500 MG tablet, Take 1,000-1,500 mg by mouth daily as needed (headache/pain.)., Disp: , Rfl:    aspirin EC 81 MG tablet, Take 1 tablet (81 mg total) by mouth daily. Swallow whole., Disp: 150 tablet, Rfl: 2   atorvastatin (LIPITOR) 40 MG tablet, Take 80 mg by mouth in the morning., Disp: , Rfl:    atorvastatin (LIPITOR) 80 MG tablet, Take 1 tablet (80 mg total) by mouth daily., Disp: 90 tablet, Rfl: 3   cetirizine (ZYRTEC) 10 MG tablet, Take 10 mg by mouth daily., Disp: , Rfl:    docusate sodium (COLACE) 100 MG capsule, Take 100 mg by mouth at bedtime., Disp: , Rfl:    Melatonin 10 MG TABS, Take 20 mg by mouth at bedtime., Disp: , Rfl:    nitroGLYCERIN (NITROSTAT) 0.4 MG SL tablet, Place 1 tablet (0.4 mg total) under the tongue every 5 (five) minutes as needed for chest pain., Disp: 25 tablet, Rfl: 3   ondansetron (ZOFRAN) 4 MG tablet, Take 1 tablet (4 mg total) by mouth every 6 (six) hours as needed for nausea or vomiting. (Patient not taking: Reported on 08/04/2021), Disp: 20 tablet, Rfl: 0   Probiotic Product (PROBIOTIC PO), Take 1 capsule by mouth daily., Disp: , Rfl:    ticagrelor (BRILINTA) 90 MG TABS tablet, Take 1 tablet (90 mg total) by  mouth 2 (two) times daily., Disp: 32 tablet, Rfl: 0  Past Medical History: Past Medical History:  Diagnosis Date   Coronary artery disease cardiologist--- dr croitoru   ETT 04-01-2016 in epic, reproduced chest pain but no EKG changes;  cardiac cath 04-06-2016 moderate diffuse nonobstructive disease, aggressive medical management   History of 2019 novel coronavirus disease (COVID-19) 03/02/2020   per pt tested positive covid, copy of result in epic, stated mild symptoms that resolved   History of kidney stones    Incomplete right bundle branch block    Right ureteral calculus    Wears glasses     Tobacco Use: Social History   Tobacco Use  Smoking Status Every Day   Packs/day: 1.50   Years: 35.00   Total pack years: 52.50   Types: Cigarettes  Smokeless Tobacco Never    Labs: Review Flowsheet       Latest Ref Rng & Units 03/31/2016 04/01/2016 07/26/2019 07/17/2021  Labs for ITP Cardiac and Pulmonary Rehab  Cholestrol 0 - 200 mg/dL - 147  166  78   LDL (calc) 0 - 99 mg/dL - 105  121  26   HDL-C >40 mg/dL - 30  30  35   Trlycerides <150 mg/dL - 61  79  84   Hemoglobin A1c 4.8 - 5.6 % 5.7  - - -    Capillary Blood Glucose: Lab Results  Component Value Date   GLUCAP 152 (H) 09/03/2020  Exercise Target Goals: Exercise Program Goal: Individual exercise prescription set using results from initial 6 min walk test and THRR while considering  patient's activity barriers and safety.   Exercise Prescription Goal: Starting with aerobic activity 30 plus minutes a day, 3 days per week for initial exercise prescription. Provide home exercise prescription and guidelines that participant acknowledges understanding prior to discharge.  Activity Barriers & Risk Stratification:  Activity Barriers & Cardiac Risk Stratification - 08/06/21 1313       Activity Barriers & Cardiac Risk Stratification   Activity Barriers Arthritis;Back Problems;Joint Problems    Cardiac Risk  Stratification High             6 Minute Walk:  6 Minute Walk     Row Name 08/06/21 1323         6 Minute Walk   Phase Initial     Distance 1200 feet     Walk Time 6 minutes     # of Rest Breaks 0     MPH 2.27     METS 2.61     RPE 11     VO2 Peak 9.14     Symptoms No     Resting HR 80 bpm     Resting BP 108/64     Resting Oxygen Saturation  97 %     Exercise Oxygen Saturation  during 6 min walk 98 %     Max Ex. HR 89 bpm     Max Ex. BP 122/60     2 Minute Post BP 108/60              Oxygen Initial Assessment:   Oxygen Re-Evaluation:   Oxygen Discharge (Final Oxygen Re-Evaluation):   Initial Exercise Prescription:  Initial Exercise Prescription - 08/06/21 1300       Date of Initial Exercise RX and Referring Provider   Date 08/06/21    Referring Provider Dr. Angelena Form    Expected Discharge Date 11/03/21      Treadmill   MPH 1.8    Grade 0    Minutes 17      NuStep   Level 1    SPM 60    Minutes 22      Prescription Details   Frequency (times per week) 3    Duration Progress to 10 minutes continuous walking  at current work load and total walking time to 30-45 min      Intensity   THRR 40-80% of Max Heartrate 67-134    Ratings of Perceived Exertion 11-13      Resistance Training   Training Prescription Yes    Weight 4    Reps 10-15             Perform Capillary Blood Glucose checks as needed.  Exercise Prescription Changes:   Exercise Prescription Changes     Row Name 08/20/21 1500 08/23/21 1500           Response to Exercise   Blood Pressure (Admit) -- 120/60      Blood Pressure (Exercise) -- 124/64      Blood Pressure (Exit) -- 110/60      Heart Rate (Admit) -- 76 bpm      Heart Rate (Exercise) -- 91 bpm      Heart Rate (Exit) -- 85 bpm      Rating of Perceived Exertion (Exercise) -- 11      Duration -- Continue with 30 min of aerobic exercise without signs/symptoms of physical  distress.      Intensity -- THRR  unchanged        Progression   Progression -- Continue to progress workloads to maintain intensity without signs/symptoms of physical distress.        Resistance Training   Training Prescription -- Yes      Weight -- 4      Reps -- 10-15      Time -- 10 Minutes        Treadmill   MPH -- 2      Grade -- 1      Minutes -- 17      METs -- 2.53        NuStep   Level -- 3      SPM -- 90      Minutes -- 22      METs -- 2.37        Home Exercise Plan   Plans to continue exercise at Home (comment) --      Frequency Add 2 additional days to program exercise sessions. --      Initial Home Exercises Provided 08/20/21 --               Exercise Comments:   Exercise Comments     Row Name 08/20/21 1507           Exercise Comments home exercise reviewed                Exercise Goals and Review:   Exercise Goals     Row Name 08/06/21 1322 08/23/21 1540           Exercise Goals   Increase Physical Activity Yes Yes      Intervention Provide advice, education, support and counseling about physical activity/exercise needs.;Develop an individualized exercise prescription for aerobic and resistive training based on initial evaluation findings, risk stratification, comorbidities and participant's personal goals. Provide advice, education, support and counseling about physical activity/exercise needs.;Develop an individualized exercise prescription for aerobic and resistive training based on initial evaluation findings, risk stratification, comorbidities and participant's personal goals.      Expected Outcomes Short Term: Attend rehab on a regular basis to increase amount of physical activity.;Long Term: Add in home exercise to make exercise part of routine and to increase amount of physical activity.;Long Term: Exercising regularly at least 3-5 days a week. Short Term: Attend rehab on a regular basis to increase amount of physical activity.;Long Term: Add in home exercise to  make exercise part of routine and to increase amount of physical activity.;Long Term: Exercising regularly at least 3-5 days a week.      Increase Strength and Stamina Yes Yes      Intervention Provide advice, education, support and counseling about physical activity/exercise needs.;Develop an individualized exercise prescription for aerobic and resistive training based on initial evaluation findings, risk stratification, comorbidities and participant's personal goals. Provide advice, education, support and counseling about physical activity/exercise needs.;Develop an individualized exercise prescription for aerobic and resistive training based on initial evaluation findings, risk stratification, comorbidities and participant's personal goals.      Expected Outcomes Short Term: Increase workloads from initial exercise prescription for resistance, speed, and METs.;Short Term: Perform resistance training exercises routinely during rehab and add in resistance training at home;Long Term: Improve cardiorespiratory fitness, muscular endurance and strength as measured by increased METs and functional capacity (6MWT) Short Term: Increase workloads from initial exercise prescription for resistance, speed, and METs.;Short Term: Perform resistance training exercises routinely during rehab  and add in resistance training at home;Long Term: Improve cardiorespiratory fitness, muscular endurance and strength as measured by increased METs and functional capacity (6MWT)      Able to understand and use rate of perceived exertion (RPE) scale Yes Yes      Intervention Provide education and explanation on how to use RPE scale Provide education and explanation on how to use RPE scale      Expected Outcomes Short Term: Able to use RPE daily in rehab to express subjective intensity level;Long Term:  Able to use RPE to guide intensity level when exercising independently Short Term: Able to use RPE daily in rehab to express subjective  intensity level;Long Term:  Able to use RPE to guide intensity level when exercising independently      Knowledge and understanding of Target Heart Rate Range (THRR) Yes Yes      Intervention Provide education and explanation of THRR including how the numbers were predicted and where they are located for reference Provide education and explanation of THRR including how the numbers were predicted and where they are located for reference      Expected Outcomes Short Term: Able to state/look up THRR;Long Term: Able to use THRR to govern intensity when exercising independently;Short Term: Able to use daily as guideline for intensity in rehab Short Term: Able to state/look up THRR;Long Term: Able to use THRR to govern intensity when exercising independently;Short Term: Able to use daily as guideline for intensity in rehab      Able to check pulse independently Yes Yes      Intervention Provide education and demonstration on how to check pulse in carotid and radial arteries.;Review the importance of being able to check your own pulse for safety during independent exercise Provide education and demonstration on how to check pulse in carotid and radial arteries.;Review the importance of being able to check your own pulse for safety during independent exercise      Expected Outcomes Short Term: Able to explain why pulse checking is important during independent exercise;Long Term: Able to check pulse independently and accurately Short Term: Able to explain why pulse checking is important during independent exercise;Long Term: Able to check pulse independently and accurately      Understanding of Exercise Prescription Yes Yes      Intervention Provide education, explanation, and written materials on patient's individual exercise prescription Provide education, explanation, and written materials on patient's individual exercise prescription      Expected Outcomes Short Term: Able to explain program exercise  prescription;Long Term: Able to explain home exercise prescription to exercise independently Short Term: Able to explain program exercise prescription;Long Term: Able to explain home exercise prescription to exercise independently               Exercise Goals Re-Evaluation :  Exercise Goals Re-Evaluation     Row Name 08/23/21 1540             Exercise Goal Re-Evaluation   Exercise Goals Review Increase Physical Activity;Increase Strength and Stamina;Able to understand and use rate of perceived exertion (RPE) scale;Knowledge and understanding of Target Heart Rate Range (THRR);Able to check pulse independently;Understanding of Exercise Prescription       Comments Pt has completed 5 sessions of cardiac rehab. He is motivated to come to class and pushes himself during class to increase his workload. He is currently exercising a t2.53 METs on the treadmill. Will contiune to monitor and progress as able.       Expected  Outcomes Through exercise at rehab and at home, the patient will meet their stated goals.                 Discharge Exercise Prescription (Final Exercise Prescription Changes):  Exercise Prescription Changes - 08/23/21 1500       Response to Exercise   Blood Pressure (Admit) 120/60    Blood Pressure (Exercise) 124/64    Blood Pressure (Exit) 110/60    Heart Rate (Admit) 76 bpm    Heart Rate (Exercise) 91 bpm    Heart Rate (Exit) 85 bpm    Rating of Perceived Exertion (Exercise) 11    Duration Continue with 30 min of aerobic exercise without signs/symptoms of physical distress.    Intensity THRR unchanged      Progression   Progression Continue to progress workloads to maintain intensity without signs/symptoms of physical distress.      Resistance Training   Training Prescription Yes    Weight 4    Reps 10-15    Time 10 Minutes      Treadmill   MPH 2    Grade 1    Minutes 17    METs 2.53      NuStep   Level 3    SPM 90    Minutes 22    METs 2.37              Nutrition:  Target Goals: Understanding of nutrition guidelines, daily intake of sodium '1500mg'$ , cholesterol '200mg'$ , calories 30% from fat and 7% or less from saturated fats, daily to have 5 or more servings of fruits and vegetables.  Biometrics:  Pre Biometrics - 08/06/21 1312       Pre Biometrics   Height '5\' 10"'$  (1.778 m)    Weight 93.2 kg    Waist Circumference 43 inches    Hip Circumference 41 inches    Waist to Hip Ratio 1.05 %    BMI (Calculated) 29.48    Triceps Skinfold 20 mm    % Body Fat 30.1 %    Grip Strength 38.3 kg    Flexibility 0 in    Single Leg Stand 27.13 seconds              Nutrition Therapy Plan and Nutrition Goals:  Nutrition Therapy & Goals - 08/16/21 0908       Personal Nutrition Goals   Comments We offer 2 educational sessions on heart healthy nutrition with handouts.      Intervention Plan   Intervention Nutrition handout(s) given to patient.    Expected Outcomes Short Term Goal: Understand basic principles of dietary content, such as calories, fat, sodium, cholesterol and nutrients.             Nutrition Assessments:  MEDIFICTS Score Key: ?70 Need to make dietary changes  40-70 Heart Healthy Diet ? 40 Therapeutic Level Cholesterol Diet   Picture Your Plate Scores: <42 Unhealthy dietary pattern with much room for improvement. 41-50 Dietary pattern unlikely to meet recommendations for good health and room for improvement. 51-60 More healthful dietary pattern, with some room for improvement.  >60 Healthy dietary pattern, although there may be some specific behaviors that could be improved.    Nutrition Goals Re-Evaluation:   Nutrition Goals Discharge (Final Nutrition Goals Re-Evaluation):   Psychosocial: Target Goals: Acknowledge presence or absence of significant depression and/or stress, maximize coping skills, provide positive support system. Participant is able to verbalize types and ability to use  techniques and skills needed  for reducing stress and depression.  Initial Review & Psychosocial Screening:  Initial Psych Review & Screening - 08/06/21 1313       Initial Review   Current issues with Current Sleep Concerns;Current Stress Concerns    Source of Stress Concerns Financial    Comments He is currently out of work, and he has a lot of bills occuring.      Family Dynamics   Good Support System? Yes    Comments His support system is his wife.      Barriers   Psychosocial barriers to participate in program The patient should benefit from training in stress management and relaxation.      Screening Interventions   Interventions Encouraged to exercise;Provide feedback about the scores to participant    Expected Outcomes Long Term Goal: Stressors or current issues are controlled or eliminated.;Short Term goal: Identification and review with participant of any Quality of Life or Depression concerns found by scoring the questionnaire.;Long Term goal: The participant improves quality of Life and PHQ9 Scores as seen by post scores and/or verbalization of changes             Quality of Life Scores:  Quality of Life - 08/06/21 1324       Quality of Life   Select Quality of Life      Quality of Life Scores   Health/Function Pre 20.5 %    Socioeconomic Pre 19.79 %    Psych/Spiritual Pre 20.5 %    Family Pre 22.8 %    GLOBAL Pre 21.72 %            Scores of 19 and below usually indicate a poorer quality of life in these areas.  A difference of  2-3 points is a clinically meaningful difference.  A difference of 2-3 points in the total score of the Quality of Life Index has been associated with significant improvement in overall quality of life, self-image, physical symptoms, and general health in studies assessing change in quality of life.  PHQ-9: Review Flowsheet       08/06/2021 08/04/2021  Depression screen PHQ 2/9  Decreased Interest 0 0  Down, Depressed, Hopeless  1 0  PHQ - 2 Score 1 0  Altered sleeping 2 -  Tired, decreased energy 1 -  Change in appetite 0 -  Feeling bad or failure about yourself  0 -  Trouble concentrating 0 -  Moving slowly or fidgety/restless 0 -  Suicidal thoughts 0 -  PHQ-9 Score 4 -  Difficult doing work/chores Not difficult at all -   Interpretation of Total Score  Total Score Depression Severity:  1-4 = Minimal depression, 5-9 = Mild depression, 10-14 = Moderate depression, 15-19 = Moderately severe depression, 20-27 = Severe depression   Psychosocial Evaluation and Intervention:  Psychosocial Evaluation - 08/06/21 1347       Psychosocial Evaluation & Interventions   Interventions Encouraged to exercise with the program and follow exercise prescription    Comments Pt has no barriers to participating in CR. He does have current sleep and stress concerns. He reports that he has difficulty falling asleep and staying asleep. He takes melatonin for this and reports that it does help some. He is also currently stressed about his finances. He had just quit his job before his heart attack, and now he has a lot of medical bills coming in, and he is still without a job. 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 He has requested a referral to vocational rehab so that they can explore other lines of work. He has no other identifiable psychosocial issues. He scored a 4 on his PHQ-9. This relates to his sleep, his lack of energy since his heart attack, and feeling down several days per week. He reports that the last year and a half has been rough for him due to medical issues and the death of both of his parents. Both of his parents died, and reports that their decline was slow and that they both suffered over their final months. Last year he had issues with kidney stones and he had part of his small intestine removed. He reports that he has a good support  system with his wife. He has cut back his cigarette smoking from 1.5 packs per day to 8 cigarettes per day. He is using nicotine gum to help him and is considering adding patches. He does not want to try medications like Chantix at this time due to the side effects. His cessation plan is to continue to wean himself down until he is no longer smoking at all. He is hopeful that he will be able to do this in a month. He states that he does not need to speak with a cessation councilor. His goals while in the program are to quit smoking, improve his heart functioning, get stronger, and to learn about his condition and nutrtion. He is eager to start the program and is interested in continuing to improve his health.    Expected Outcomes Pt's sleep issues will continue to be managed with melatonin, his stressors will be managed, and he will continue to have no other identifiable psychosocial issues.    Continue Psychosocial Services  No Follow up required             Psychosocial Re-Evaluation:  Psychosocial Re-Evaluation     [place] Name 08/16/21 [phone]             Psychosocial Re-Evaluation   Current issues with Current Sleep Concerns;Current Stress Concerns       Comments Patient is new to the program. He plans to start today 6/10. We will continue to monitor his progress.       Expected Outcomes Patient will continue to have no psychosocial barriers identified.       Interventions Encouraged to attend Cardiac Rehabilitation for the exercise;Encouraged to attend Pulmonary Rehabilitation for the exercise;Stress management education       Continue Psychosocial Services  No Follow up required         Initial Review   Source of Stress Concerns Financial       Comments He is currently out of work, and he has a lot of bills occuring.                Psychosocial Discharge (Final Psychosocial Re-Evaluation):  Psychosocial Re-Evaluation - 08/16/21 0846       Psychosocial Re-Evaluation   Current  issues with Current Sleep Concerns;Current Stress Concerns    Comments Patient is new to the program. He plans to start today 6/10. We will continue to monitor his progress.    Expected Outcomes Patient will continue to have no psychosocial barriers identified.    Interventions Encouraged to attend Cardiac Rehabilitation for the exercise;Encouraged to attend Pulmonary Rehabilitation for the exercise;Stress management education    Continue Psychosocial Services  No Follow up required      Initial  Review   Source of Stress Concerns Financial    Comments He is currently out of work, and he has a lot of bills occuring.             Vocational Rehabilitation: Provide vocational rehab assistance to qualifying candidates.   Vocational Rehab Evaluation & Intervention:  Vocational Rehab - 08/06/21 1318       Initial Vocational Rehab Evaluation & Intervention   Assessment shows need for Vocational Rehabilitation Yes             Education: Education Goals: Education classes will be provided on a weekly basis, covering required topics. Participant will state understanding/return demonstration of topics presented.  Learning Barriers/Preferences:  Learning Barriers/Preferences - 08/06/21 1318       Learning Barriers/Preferences   Learning Barriers None    Learning Preferences Written Material             Education Topics: Hypertension, Hypertension Reduction -Define heart disease and high blood pressure. Discus how high blood pressure affects the body and ways to reduce high blood pressure.   Exercise and Your Heart -Discuss why it is important to exercise, the FITT principles of exercise, normal and abnormal responses to exercise, and how to exercise safely. Flowsheet Row CARDIAC REHAB PHASE II EXERCISE from 08/18/2021 in [place]  Date 08/18/21  Educator [place]  Instruction Review Code 1- Verbalizes Understanding       Angina -Discuss definition  of angina, causes of angina, treatment of angina, and how to decrease risk of having angina.   Cardiac Medications -Review what the following cardiac medications are used for, how they affect the body, and side effects that may occur when taking the medications.  Medications include Aspirin, Beta blockers, calcium channel blockers, ACE Inhibitors, angiotensin receptor blockers, diuretics, digoxin, and antihyperlipidemics.   Congestive Heart Failure -Discuss the definition of CHF, how to live with CHF, the signs and symptoms of CHF, and how keep track of weight and sodium intake.   Heart Disease and Intimacy -Discus the effect sexual activity has on the heart, how changes occur during intimacy as we age, and safety during sexual activity.   Smoking Cessation / COPD -Discuss different methods to quit smoking, the health benefits of quitting smoking, and the definition of COPD.   Nutrition I: Fats -Discuss the types of cholesterol, what cholesterol does to the heart, and how cholesterol levels can be controlled.   Nutrition II: Labels -Discuss the different components of food labels and how to read food label   Heart Parts/Heart Disease and PAD -Discuss the anatomy of the heart, the pathway of blood circulation through the heart, and these are affected by heart disease.   Stress I: Signs and Symptoms -Discuss the causes of stress, how stress may lead to anxiety and depression, and ways to limit stress.   Stress II: Relaxation -Discuss different types of relaxation techniques to limit stress.   Warning Signs of Stroke / TIA -Discuss definition of a stroke, what the signs and symptoms are of a stroke, and how to identify when someone is having stroke.   Knowledge Questionnaire Score:  Knowledge Questionnaire Score - 08/06/21 1249       Knowledge Questionnaire Score   Pre Score 28/28             Core Components/Risk Factors/Patient Goals at Admission:  Personal  Goals and Risk Factors at Admission - 08/06/21 1319       Core Components/Risk Factors/Patient Goals on Admission  Tobacco Cessation Yes    Number of packs per day 1/3    Intervention Assist the participant in steps to quit. Provide individualized education and counseling about committing to Tobacco Cessation, relapse prevention, and pharmacological support that can be provided by physician.;Advice worker, assist with locating and accessing local/national Quit Smoking programs, and support quit date choice.    Expected Outcomes Short Term: Will demonstrate readiness to quit, by selecting a quit date.;Short Term: Will quit all tobacco product use, adhering to prevention of relapse plan.;Long Term: Complete abstinence from all tobacco products for at least 12 months from quit date.    Lipids Yes    Intervention Provide education and support for participant on nutrition & aerobic/resistive exercise along with prescribed medications to achieve LDL '70mg'$ , HDL >'40mg'$ .    Expected Outcomes Short Term: Participant states understanding of desired cholesterol values and is compliant with medications prescribed. Participant is following exercise prescription and nutrition guidelines.;Long Term: Cholesterol controlled with medications as prescribed, with individualized exercise RX and with personalized nutrition plan. Value goals: LDL < '70mg'$ , HDL > 40 mg.    Personal Goal Other Yes    Personal Goal Improve heart function, improve stamina, learn about his condition and nutrition.    Intervention Attend CR three days per week, attend education sessions, and exercise at home 2-4 days per week.    Expected Outcomes Pt will meet his stated goals.             Core Components/Risk Factors/Patient Goals Review:   Goals and Risk Factor Review     Row Name 08/16/21 0850             Core Components/Risk Factors/Patient Goals Review   Personal Goals Review Lipids;Tobacco Cessation;Other        Review Patient was referred to CR with STEMI/PTCA. He has multiple risk factors for CAD and is participating in the program for risk modification. He plans to start the program today 6/10. he is currently smoking 8 cigerattes/day and hopes to wean himself. He is using nicotine gum. His personal goals for the program are to quit smoking; improve his heart functioning; get stronger and learn about his condition and nutrition. We will continue to monitor his progress as he works towards meeting these goals.       Expected Outcomes Patient will complete the program meeting both personal and program goals.                Core Components/Risk Factors/Patient Goals at Discharge (Final Review):   Goals and Risk Factor Review - 08/16/21 0850       Core Components/Risk Factors/Patient Goals Review   Personal Goals Review Lipids;Tobacco Cessation;Other    Review Patient was referred to CR with STEMI/PTCA. He has multiple risk factors for CAD and is participating in the program for risk modification. He plans to start the program today 6/10. he is currently smoking 8 cigerattes/day and hopes to wean himself. He is using nicotine gum. His personal goals for the program are to quit smoking; improve his heart functioning; get stronger and learn about his condition and nutrition. We will continue to monitor his progress as he works towards meeting these goals.    Expected Outcomes Patient will complete the program meeting both personal and program goals.             ITP Comments:   Comments: ITP REVIEW Pt is making expected progress toward Cardiac Rehab goals after completing 5 sessions. Recommend continued exercise,  life style modification, education, and increased stamina and strength.

## 2021-08-27 ENCOUNTER — Encounter (HOSPITAL_COMMUNITY)
Admission: RE | Admit: 2021-08-27 | Discharge: 2021-08-27 | Disposition: A | Discharge status: Home / Self Care | Payer: BC Managed Care – PPO | Source: Ambulatory Visit | Attending: Cardiovascular Disease | Admitting: Cardiovascular Disease

## 2021-08-27 DIAGNOSIS — Z955 Presence of coronary angioplasty implant and graft: Secondary | ICD-10-CM

## 2021-08-27 DIAGNOSIS — I2102 ST elevation (STEMI) myocardial infarction involving left anterior descending coronary artery: Secondary | ICD-10-CM | POA: Diagnosis not present

## 2021-08-27 DIAGNOSIS — Z9861 Coronary angioplasty status: Secondary | ICD-10-CM

## 2021-08-27 NOTE — Progress Notes (Signed)
Daily Session Note  Patient Details  Name: Derrick Turner MRN: [id number] Date of Birth: Oct 02, 1970 Referring Provider:   Flowsheet Row CARDIAC REHAB PHASE II ORIENTATION from 08/06/2021 in [place]  Referring Provider Dr. Angelena Form       Encounter Date: 08/27/2021  Check In:  Session Check In - 08/27/21 1437       Check-In   Supervising physician immediately available to respond to emergencies [facility] MD immediately available    Physician(s) Dr Zandra Abts    Location [facility]    Staff Present Redge Gainer, BS, Exercise Physiologist;Rise Traeger Hassell Done, RN, Jennye Moccasin, RN, BSN    Virtual Visit No    Medication changes reported     No    Fall or balance concerns reported    No    Tobacco Cessation No Change    Warm-up and Cool-down Performed as group-led instruction    Resistance Training Performed Yes    VAD Patient? No    PAD/SET Patient? No      Pain Assessment   Currently in Pain? No/denies    Pain Score 0-No pain    Multiple Pain Sites No             Capillary Blood Glucose: No results found for this or any previous visit (from the past 24 hour(s)).    Social History   Tobacco Use  Smoking Status Every Day   Packs/day: 1.50   Years: 35.00   Total pack years: 52.50   Types: Cigarettes  Smokeless Tobacco Never    Goals Met:  Independence with exercise equipment Exercise tolerated well No report of concerns or symptoms today Strength training completed today  Goals Unmet:  Not Applicable  Comments: checkout at 1545.   Dr. Carlyle Dolly is Medical Director for [facility]

## 2021-08-30 ENCOUNTER — Encounter (HOSPITAL_COMMUNITY)
Admission: RE | Admit: 2021-08-30 | Discharge: 2021-08-30 | Disposition: A | Discharge status: Home / Self Care | Payer: BC Managed Care – PPO | Source: Ambulatory Visit | Attending: Cardiovascular Disease | Admitting: Cardiovascular Disease

## 2021-08-30 DIAGNOSIS — Z955 Presence of coronary angioplasty implant and graft: Secondary | ICD-10-CM

## 2021-08-30 DIAGNOSIS — I2102 ST elevation (STEMI) myocardial infarction involving left anterior descending coronary artery: Secondary | ICD-10-CM | POA: Diagnosis not present

## 2021-08-30 DIAGNOSIS — Z9861 Coronary angioplasty status: Secondary | ICD-10-CM

## 2021-08-30 NOTE — Progress Notes (Signed)
Daily Session Note  Patient Details  Name: Derrick Turner MRN: [id number] Date of Birth: May 04, 1970 Referring Provider:   Flowsheet Row CARDIAC REHAB PHASE II ORIENTATION from 08/06/2021 in [place]  Referring Provider Dr. Angelena Form       Encounter Date: 08/30/2021  Check In:  Session Check In - 08/30/21 1445       Check-In   Supervising physician immediately available to respond to emergencies [facility] MD immediately available    Physician(s) Dr. Domenic Polite    Location [facility]    Staff Present Geanie Cooley, RN;Dalton Kris Mouton, MS, ACSM-CEP, Exercise Physiologist;Daphyne Hassell Done, RN, BSN    Virtual Visit No    Medication changes reported     No    Fall or balance concerns reported    No    Tobacco Cessation No Change    Warm-up and Cool-down Performed as group-led instruction    Resistance Training Performed Yes    VAD Patient? No    PAD/SET Patient? No      Pain Assessment   Currently in Pain? No/denies    Pain Score 0-No pain    Multiple Pain Sites No             Capillary Blood Glucose: No results found for this or any previous visit (from the past 24 hour(s)).    Social History   Tobacco Use  Smoking Status Every Day   Packs/day: 1.50   Years: 35.00   Total pack years: 52.50   Types: Cigarettes  Smokeless Tobacco Never    Goals Met:  Independence with exercise equipment Exercise tolerated well No report of concerns or symptoms today Strength training completed today  Goals Unmet:  Not Applicable  Comments: check out @ 3:45pm   Dr. Carlyle Dolly is Medical Director for [place]

## 2021-09-01 ENCOUNTER — Telehealth: Payer: Self-pay | Admitting: Cardiovascular Disease

## 2021-09-01 ENCOUNTER — Encounter (HOSPITAL_COMMUNITY)
Admission: RE | Admit: 2021-09-01 | Discharge: 2021-09-01 | Disposition: A | Discharge status: Home / Self Care | Payer: BC Managed Care – PPO | Source: Ambulatory Visit | Attending: Cardiovascular Disease | Admitting: Cardiovascular Disease

## 2021-09-01 DIAGNOSIS — Z9861 Coronary angioplasty status: Secondary | ICD-10-CM

## 2021-09-01 DIAGNOSIS — I2102 ST elevation (STEMI) myocardial infarction involving left anterior descending coronary artery: Secondary | ICD-10-CM

## 2021-09-01 DIAGNOSIS — Z955 Presence of coronary angioplasty implant and graft: Secondary | ICD-10-CM

## 2021-09-01 NOTE — Progress Notes (Signed)
Daily Session Note  Patient Details  Name: Derrick Turner MRN: [id number] Date of Birth: Jan 16, 1971 Referring Provider:   Flowsheet Row CARDIAC REHAB PHASE II ORIENTATION from 08/06/2021 in [place]  Referring Provider Dr. Angelena Form       Encounter Date: 09/01/2021  Check In:  Session Check In - 09/01/21 1445       Check-In   Supervising physician immediately available to respond to emergencies [facility] immediately available    Physician(s) Dr. Johnsie Cancel    Location [facility]    Staff Present Geanie Cooley, RN;Daphyne Hassell Done, RN, Bjorn Loser, MS, ACSM-CEP, Exercise Physiologist    Virtual Visit No    Medication changes reported     No    Fall or balance concerns reported    No    Tobacco Cessation No Change    Warm-up and Cool-down Performed as group-led instruction    Resistance Training Performed Yes    VAD Patient? No    PAD/SET Patient? No      Pain Assessment   Currently in Pain? No/denies    Pain Score 0-No pain    Multiple Pain Sites No             Capillary Blood Glucose: No results found for this or any previous visit (from the past 24 hour(s)).    Social History   Tobacco Use  Smoking Status Every Day   Packs/day: 1.50   Years: 35.00   Total pack years: 52.50   Types: Cigarettes  Smokeless Tobacco Never    Goals Met:  Independence with exercise equipment Exercise tolerated well No report of concerns or symptoms today Strength training completed today  Goals Unmet:  Not Applicable  Comments: check out @ 3.45pm   Dr. Carlyle Dolly is Medical Director for [place]

## 2021-09-01 NOTE — Telephone Encounter (Signed)
Spoke to patient he stated he needs a letter to return to work.Advised I will send message to Dr.Croitoru.

## 2021-09-01 NOTE — Telephone Encounter (Signed)
Patient called wanting to get a doctor's note stating he is OK to return to work after having a heart attack in June.

## 2021-09-03 ENCOUNTER — Encounter: Payer: Self-pay | Admitting: *Deleted

## 2021-09-03 ENCOUNTER — Encounter (HOSPITAL_COMMUNITY)
Admission: RE | Admit: 2021-09-03 | Discharge: 2021-09-03 | Disposition: A | Discharge status: Home / Self Care | Payer: BC Managed Care – PPO | Source: Ambulatory Visit | Attending: Cardiovascular Disease | Admitting: Cardiovascular Disease

## 2021-09-03 DIAGNOSIS — I2102 ST elevation (STEMI) myocardial infarction involving left anterior descending coronary artery: Secondary | ICD-10-CM | POA: Diagnosis not present

## 2021-09-03 DIAGNOSIS — Z9861 Coronary angioplasty status: Secondary | ICD-10-CM

## 2021-09-03 DIAGNOSIS — Z955 Presence of coronary angioplasty implant and graft: Secondary | ICD-10-CM

## 2021-09-03 NOTE — Telephone Encounter (Signed)
The patient has been made aware that the letter has been released to [place].  Lendon Colonel, NP  You 54 minutes ago (7:28 AM)    May return to work without restrictions.  Please provide letter.

## 2021-09-03 NOTE — Progress Notes (Signed)
Daily Session Note  Patient Details  Name: Derrick Turner MRN: [id number] Date of Birth: 1970-02-20 Referring Provider:   Flowsheet Row CARDIAC REHAB PHASE II ORIENTATION from 08/06/2021 in [place]  Referring Provider Dr. Angelena Form       Encounter Date: 09/03/2021  Check In:  Session Check In - 09/03/21 1430       Check-In   Supervising physician immediately available to respond to emergencies [facility] immediately available    Physician(s) Dr Julieanne Manson    Location [facility]    Staff Present Geanie Cooley, RN;Somtochukwu Woollard Hassell Done, RN, Bjorn Loser, MS, ACSM-CEP, Exercise Physiologist    Virtual Visit No    Medication changes reported     No    Fall or balance concerns reported    No    Tobacco Cessation No Change    Warm-up and Cool-down Performed as group-led instruction    Resistance Training Performed Yes    VAD Patient? No    PAD/SET Patient? No      Pain Assessment   Currently in Pain? No/denies    Pain Score 0-No pain    Multiple Pain Sites No             Capillary Blood Glucose: No results found for this or any previous visit (from the past 24 hour(s)).    Social History   Tobacco Use  Smoking Status Every Day   Packs/day: 1.50   Years: 35.00   Total pack years: 52.50   Types: Cigarettes  Smokeless Tobacco Never    Goals Met:  Independence with exercise equipment Exercise tolerated well No report of concerns or symptoms today Strength training completed today  Goals Unmet:  Not Applicable  Comments: checkout at 1545.   Dr. Carlyle Dolly is Medical Director for [facility]

## 2021-09-06 ENCOUNTER — Encounter (HOSPITAL_COMMUNITY)
Admission: RE | Admit: 2021-09-06 | Discharge: 2021-09-06 | Disposition: A | Discharge status: Home / Self Care | Payer: BC Managed Care – PPO | Source: Ambulatory Visit | Attending: Cardiovascular Disease | Admitting: Cardiovascular Disease

## 2021-09-06 VITALS — Wt 202.6 lb

## 2021-09-06 DIAGNOSIS — I2102 ST elevation (STEMI) myocardial infarction involving left anterior descending coronary artery: Secondary | ICD-10-CM | POA: Diagnosis not present

## 2021-09-06 DIAGNOSIS — Z9861 Coronary angioplasty status: Secondary | ICD-10-CM

## 2021-09-06 DIAGNOSIS — Z955 Presence of coronary angioplasty implant and graft: Secondary | ICD-10-CM

## 2021-09-06 NOTE — Progress Notes (Signed)
Daily Session Note  Patient Details  Name: Derrick Turner MRN: [id number] Date of Birth: Oct 30, 1970 Referring Provider:   Flowsheet Row CARDIAC REHAB PHASE II ORIENTATION from 08/06/2021 in [place]  Referring Provider Dr. Angelena Form       Encounter Date: 09/06/2021  Check In:  Session Check In - 09/06/21 1445       Check-In   Supervising physician immediately available to respond to emergencies [facility] immediately available    Physician(s) Dr.  Harrington Challenger    Location [facility]    Staff Present Geanie Cooley, RN;Heather Otho Ket, BS, Exercise Physiologist;Debra Wynetta Emery, RN, Bjorn Loser, MS, ACSM-CEP, Exercise Physiologist    Virtual Visit No    Medication changes reported     No    Fall or balance concerns reported    No    Tobacco Cessation No Change    Warm-up and Cool-down Performed as group-led instruction    Resistance Training Performed Yes    VAD Patient? No    PAD/SET Patient? No      Pain Assessment   Currently in Pain? No/denies    Pain Score 0-No pain    Multiple Pain Sites No             Capillary Blood Glucose: No results found for this or any previous visit (from the past 24 hour(s)).    Social History   Tobacco Use  Smoking Status Every Day   Packs/day: 1.50   Years: 35.00   Total pack years: 52.50   Types: Cigarettes  Smokeless Tobacco Never    Goals Met:  Independence with exercise equipment Exercise tolerated well No report of concerns or symptoms today Strength training completed today  Goals Unmet:  Not Applicable  Comments: check out @ 3:45pm   Dr. Carlyle Dolly is Medical Director for [place]

## 2021-09-08 ENCOUNTER — Encounter (HOSPITAL_COMMUNITY)
Admission: RE | Admit: 2021-09-08 | Discharge: 2021-09-08 | Disposition: A | Discharge status: Home / Self Care | Payer: BC Managed Care – PPO | Source: Ambulatory Visit | Attending: Cardiovascular Disease | Admitting: Cardiovascular Disease

## 2021-09-08 DIAGNOSIS — Z9861 Coronary angioplasty status: Secondary | ICD-10-CM | POA: Insufficient documentation

## 2021-09-08 DIAGNOSIS — Z955 Presence of coronary angioplasty implant and graft: Secondary | ICD-10-CM | POA: Diagnosis present

## 2021-09-08 DIAGNOSIS — I2102 ST elevation (STEMI) myocardial infarction involving left anterior descending coronary artery: Secondary | ICD-10-CM | POA: Insufficient documentation

## 2021-09-08 NOTE — Progress Notes (Signed)
Daily Session Note  Patient Details  Name: Derrick Turner MRN: [id number] Date of Birth: [birth year] Referring Provider:   Flowsheet Row CARDIAC REHAB PHASE II ORIENTATION from 08/06/2021 in [place]  Referring Provider Dr. Angelena Form       Encounter Date: 09/08/2021  Check In:  Session Check In - 09/08/21 1445       Check-In   Supervising physician immediately available to respond to emergencies [facility] immediately available    Physician(s) Dr. Johney Frame    Location [facility]    Staff Present Geanie Cooley, RN;Heather Otho Ket, BS, Exercise Physiologist;Dalton Kris Mouton, MS, ACSM-CEP, Exercise Physiologist;Daphyne Hassell Done, RN, BSN    Virtual Visit No    Medication changes reported     No    Fall or balance concerns reported    No    Tobacco Cessation No Change    Warm-up and Cool-down Performed as group-led instruction    Resistance Training Performed Yes    VAD Patient? No    PAD/SET Patient? No      Pain Assessment   Currently in Pain? No/denies    Pain Score 0-No pain    Multiple Pain Sites No             Capillary Blood Glucose: No results found for this or any previous visit (from the past 24 hour(s)).    Social History   Tobacco Use  Smoking Status Every Day   Packs/day: 1.50   Years: 35.00   Total pack years: 52.50   Types: Cigarettes  Smokeless Tobacco Never    Goals Met:  Independence with exercise equipment Exercise tolerated well No report of concerns or symptoms today Strength training completed today  Goals Unmet:  Not Applicable  Comments: check out @ 3:45pm   Dr. Carlyle Dolly is Medical Director for [place]

## 2021-09-10 ENCOUNTER — Encounter (HOSPITAL_COMMUNITY)
Admission: RE | Admit: 2021-09-10 | Discharge: 2021-09-10 | Disposition: A | Discharge status: Home / Self Care | Payer: BC Managed Care – PPO | Source: Ambulatory Visit | Attending: Cardiovascular Disease | Admitting: Cardiovascular Disease

## 2021-09-10 DIAGNOSIS — I2102 ST elevation (STEMI) myocardial infarction involving left anterior descending coronary artery: Secondary | ICD-10-CM

## 2021-09-10 DIAGNOSIS — Z9861 Coronary angioplasty status: Secondary | ICD-10-CM

## 2021-09-10 DIAGNOSIS — Z955 Presence of coronary angioplasty implant and graft: Secondary | ICD-10-CM

## 2021-09-10 NOTE — Progress Notes (Signed)
Daily Session Note  Patient Details  Name: Derrick Turner MRN: [id number] Date of Birth: 1970-11-28 Referring Provider:   Flowsheet Row CARDIAC REHAB PHASE II ORIENTATION from 08/06/2021 in [place]  Referring Provider Dr. Angelena Form       Encounter Date: 09/10/2021  Check In:  Session Check In - 09/10/21 1442       Check-In   Supervising physician immediately available to respond to emergencies [facility] MD immediately available    Physician(s) Dr. Audie Box    Location [facility]    Staff Present Geanie Cooley, RN;Heather Otho Ket, BS, Exercise Physiologist;Dalton Kris Mouton, MS, ACSM-CEP, Exercise Physiologist;Daphyne Hassell Done, RN, BSN    Virtual Visit No    Medication changes reported     No    Fall or balance concerns reported    No    Tobacco Cessation No Change    Warm-up and Cool-down Performed as group-led instruction    Resistance Training Performed Yes    VAD Patient? No    PAD/SET Patient? No      Pain Assessment   Currently in Pain? No/denies    Pain Score 0-No pain    Multiple Pain Sites No             Capillary Blood Glucose: No results found for this or any previous visit (from the past 24 hour(s)).    Social History   Tobacco Use  Smoking Status Every Day   Packs/day: 1.50   Years: 35.00   Total pack years: 52.50   Types: Cigarettes  Smokeless Tobacco Never    Goals Met:  Independence with exercise equipment Exercise tolerated well No report of concerns or symptoms today Strength training completed today  Goals Unmet:  Not Applicable  Comments: check out @ 3:45pm   Dr. Carlyle Dolly is Medical Director for [place]

## 2021-09-13 ENCOUNTER — Encounter (HOSPITAL_COMMUNITY)
Admission: RE | Admit: 2021-09-13 | Discharge: 2021-09-13 | Disposition: A | Discharge status: Home / Self Care | Payer: BC Managed Care – PPO | Source: Ambulatory Visit | Attending: Cardiovascular Disease | Admitting: Cardiovascular Disease

## 2021-09-13 DIAGNOSIS — I2102 ST elevation (STEMI) myocardial infarction involving left anterior descending coronary artery: Secondary | ICD-10-CM | POA: Diagnosis not present

## 2021-09-13 DIAGNOSIS — Z955 Presence of coronary angioplasty implant and graft: Secondary | ICD-10-CM

## 2021-09-13 DIAGNOSIS — Z9861 Coronary angioplasty status: Secondary | ICD-10-CM

## 2021-09-13 NOTE — Progress Notes (Signed)
Daily Session Note  Patient Details  Name: Derrick Turner MRN: [id number] Date of Birth: 11-09-1970 Referring Provider:   Flowsheet Row CARDIAC REHAB PHASE II ORIENTATION from [facility]06/[facility] in [facility]  Referring Provider Dr. Angelena Form       Encounter Date: 09/13/2021  Check In:  Session Check In - 09/13/21 1445       Check-In   Supervising physician immediately available to respond to emergencies [facility] MD immediately available    Physician(s) Dr. Debara Pickett    Location [facility]    Staff Present Redge Gainer, BS, Exercise Physiologist;Daphyne Hassell Done, RN, BSN    Virtual Visit No    Medication changes reported     No    Fall or balance concerns reported    No    Tobacco Cessation No Change    Warm-up and Cool-down Performed as group-led instruction    Resistance Training Performed Yes    VAD Patient? No    PAD/SET Patient? No      Pain Assessment   Currently in Pain? No/denies    Pain Score 0-No pain    Multiple Pain Sites No             Capillary Blood Glucose: No results found for this or any previous visit (from the past 24 hour(s)).    Social History   Tobacco Use  Smoking Status Every Day   Packs/day: 1.50   Years: 35.00   Total pack years: 52.50   Types: Cigarettes  Smokeless Tobacco Never    Goals Met:  Independence with exercise equipment Exercise tolerated well No report of concerns or symptoms today Strength training completed today  Goals Unmet:  Not Applicable  Comments: check out 1545   Dr. Carlyle Dolly is Medical Director for [place]

## 2021-09-15 ENCOUNTER — Other Ambulatory Visit: Payer: Self-pay | Admitting: Physician Assistant

## 2021-09-15 ENCOUNTER — Encounter (HOSPITAL_COMMUNITY): Payer: BC Managed Care – PPO

## 2021-09-15 DIAGNOSIS — E785 Hyperlipidemia, unspecified: Secondary | ICD-10-CM

## 2021-09-17 ENCOUNTER — Encounter (HOSPITAL_COMMUNITY): Payer: BC Managed Care – PPO

## 2021-09-20 ENCOUNTER — Encounter (HOSPITAL_COMMUNITY): Payer: BC Managed Care – PPO

## 2021-09-21 NOTE — Progress Notes (Signed)
Discharge Progress Report  Patient Details  Name: Derrick Turner MRN: [id number] Date of Birth: 04/05/1970 Referring Provider:   Flowsheet Row CARDIAC REHAB PHASE II ORIENTATION from 08/06/2021 in [facility] [place]  Referring Provider Dr. Angelena Form        Number of Visits: 14  Reason for Discharge:  Early Exit:  Back to work  Smoking History:  Social History   Tobacco Use  Smoking Status Every Day   Packs/day: 1.50   Years: 35.00   Total pack years: 52.50   Types: Cigarettes  Smokeless Tobacco Never    Diagnosis:  S/P PTCA (percutaneous transluminal coronary angioplasty)  Status post coronary artery stent placement  ST elevation myocardial infarction involving left anterior descending (LAD) coronary artery ([place])  ADL [facility]:   Initial Exercise Prescription:  Initial Exercise Prescription - 08/06/21 1300       Date of Initial Exercise RX and Referring Provider   Date 08/06/21    Referring Provider Dr. Angelena Form    Expected Discharge Date 11/03/21      Treadmill   MPH 1.8    Grade 0    Minutes 17      NuStep   Level 1    SPM 60    Minutes 22      Prescription Details   Frequency (times per week) 3    Duration Progress to 10 minutes continuous walking  at current work load and total walking time to 30-45 min      Intensity   THRR 40-80% of Max Heartrate 67-134    Ratings of Perceived Exertion 11-13      Resistance Training   Training Prescription Yes    Weight 4    Reps 10-15             Discharge Exercise Prescription (Final Exercise Prescription Changes):  Exercise Prescription Changes - 09/06/21 1500       Response to Exercise   Blood Pressure (Admit) 106/60    Blood Pressure (Exercise) 120/60    Blood Pressure (Exit) 110/60    Heart Rate (Admit) 85 bpm    Heart Rate (Exercise) 104 bpm    Heart Rate (Exit) 81 bpm    Rating of Perceived Exertion (Exercise) 12    Duration Continue with 30 min of aerobic exercise  without signs/symptoms of physical distress.    Intensity THRR unchanged      Progression   Progression Continue to progress workloads to maintain intensity without signs/symptoms of physical distress.      Resistance Training   Training Prescription Yes    Weight 4    Reps 10-15    Time 10 Minutes      Treadmill   MPH 2.3    Grade 2.5    Minutes 17    METs 3.55      NuStep   Level 3    SPM 90    Minutes 22    METs 2.58             Functional Capacity:  6 Minute Walk     Row Name 08/06/21 1323         6 Minute Walk   Phase Initial     Distance 1200 feet     Walk Time 6 minutes     # of Rest Breaks 0     MPH 2.27     METS 2.61     RPE 11     VO2 Peak 9.14  Symptoms No     Resting HR 80 bpm     Resting BP 108/64     Resting Oxygen Saturation  97 %     Exercise Oxygen Saturation  during 6 min walk 98 %     Max Ex. HR 89 bpm     Max Ex. BP 122/60     2 Minute Post BP 108/60              Psychological, QOL, Others - Outcomes: PHQ 2/9:    08/06/2021    1:10 PM 08/04/2021    8:39 AM  Depression screen PHQ 2/9  Decreased Interest 0 0  Down, Depressed, Hopeless 1 0  PHQ - 2 Score 1 0  Altered sleeping 2   Tired, decreased energy 1   Change in appetite 0   Feeling bad or failure about yourself  0   Trouble concentrating 0   Moving slowly or fidgety/restless 0   Suicidal thoughts 0   PHQ-9 Score 4   Difficult doing work/chores Not difficult at all     Quality of Life:  Quality of Life - 08/06/21 1324       Quality of Life   Select Quality of Life      Quality of Life Scores   Health/Function Pre 20.5 %    Socioeconomic Pre 19.79 %    Psych/Spiritual Pre 20.5 %    Family Pre 22.8 %    GLOBAL Pre 21.72 %             Personal Goals: Goals established at orientation with interventions provided to work toward goal.  Personal Goals and Risk Factors at Admission - 08/06/21 1319       Core Components/Risk Factors/Patient Goals  on Admission   Tobacco Cessation Yes    Number of packs per day 1/3    Intervention Assist the participant in steps to quit. Provide individualized education and counseling about committing to Tobacco Cessation, relapse prevention, and pharmacological support that can be provided by physician.;Advice worker, assist with locating and accessing local/national Quit Smoking programs, and support quit date choice.    Expected Outcomes Short Term: Will demonstrate readiness to quit, by selecting a quit date.;Short Term: Will quit all tobacco product use, adhering to prevention of relapse plan.;Long Term: Complete abstinence from all tobacco products for at least 12 months from quit date.    Lipids Yes    Intervention Provide education and support for participant on nutrition & aerobic/resistive exercise along with prescribed medications to achieve LDL '70mg'$ , HDL >'40mg'$ .    Expected Outcomes Short Term: Participant states understanding of desired cholesterol values and is compliant with medications prescribed. Participant is following exercise prescription and nutrition guidelines.;Long Term: Cholesterol controlled with medications as prescribed, with individualized exercise RX and with personalized nutrition plan. Value goals: LDL < '70mg'$ , HDL > 40 mg.    Personal Goal Other Yes    Personal Goal Improve heart function, improve stamina, learn about his condition and nutrition.    Intervention Attend CR three days per week, attend education sessions, and exercise at home 2-4 days per week.    Expected Outcomes Pt will meet his stated goals.              Personal Goals Discharge:  Goals and Risk Factor Review     Row Name 08/16/21 0850 09/13/21 0910           Core Components/Risk Factors/Patient Goals Review   Personal Goals Review  Lipids;Tobacco Cessation;Other Lipids;Tobacco Cessation;Other      Review Patient was referred to CR with STEMI/PTCA. He has multiple risk factors for  CAD and is participating in the program for risk modification. He plans to start the program today 6/10. he is currently smoking 8 cigerattes/day and hopes to wean himself. He is using nicotine gum. His personal goals for the program are to quit smoking; improve his heart functioning; get stronger and learn about his condition and nutrition. We will continue to monitor his progress as he works towards meeting these goals. Patient has completed 13 sessions. His current weight is 202.8 down 3 lbs from his initial weight. He is doing well in the program with consistent attendance and progressions. His blood pressure is at goals. He has been cleared to return to work 7/26. His personal goals for the program continue to be to quit smoking; improve his heart functioning; get stronger and learn about his condition and nutrition. We will continue to monitor his progress as he works towards [employer] these goals.      Expected Outcomes Patient will complete the program meeting both personal and program goals. Patient will complete the program meeting both personal and program goals.               Exercise Goals and Review:  Exercise Goals     Row Name 08/06/21 1322 08/23/21 1540           Exercise Goals   Increase Physical Activity Yes Yes      Intervention Provide advice, education, support and counseling about physical activity/exercise needs.;Develop an individualized exercise prescription for aerobic and resistive training based on initial evaluation findings, risk stratification, comorbidities and participant's personal goals. Provide advice, education, support and counseling about physical activity/exercise needs.;Develop an individualized exercise prescription for aerobic and resistive training based on initial evaluation findings, risk stratification, comorbidities and participant's personal goals.      Expected Outcomes Short Term: Attend rehab on a regular basis to increase amount of physical  activity.;Long Term: Add in home exercise to make exercise part of routine and to increase amount of physical activity.;Long Term: Exercising regularly at least 3-5 days a week. Short Term: Attend rehab on a regular basis to increase amount of physical activity.;Long Term: Add in home exercise to make exercise part of routine and to increase amount of physical activity.;Long Term: Exercising regularly at least 3-5 days a week.      Increase Strength and Stamina Yes Yes      Intervention Provide advice, education, support and counseling about physical activity/exercise needs.;Develop an individualized exercise prescription for aerobic and resistive training based on initial evaluation findings, risk stratification, comorbidities and participant's personal goals. Provide advice, education, support and counseling about physical activity/exercise needs.;Develop an individualized exercise prescription for aerobic and resistive training based on initial evaluation findings, risk stratification, comorbidities and participant's personal goals.      Expected Outcomes Short Term: Increase workloads from initial exercise prescription for resistance, speed, and METs.;Short Term: Perform resistance training exercises routinely during rehab and add in resistance training at home;Long Term: Improve cardiorespiratory fitness, muscular endurance and strength as measured by increased METs and functional capacity (6MWT) Short Term: Increase workloads from initial exercise prescription for resistance, speed, and METs.;Short Term: Perform resistance training exercises routinely during rehab and add in resistance training at home;Long Term: Improve cardiorespiratory fitness, muscular endurance and strength as measured by increased METs and functional capacity (6MWT)      Able to understand and  use rate of perceived exertion (RPE) scale Yes Yes      Intervention Provide education and explanation on how to use RPE scale Provide  education and explanation on how to use RPE scale      Expected Outcomes Short Term: Able to use RPE daily in rehab to express subjective intensity level;Long Term:  Able to use RPE to guide intensity level when exercising independently Short Term: Able to use RPE daily in rehab to express subjective intensity level;Long Term:  Able to use RPE to guide intensity level when exercising independently      Knowledge and understanding of Target Heart Rate Range (THRR) Yes Yes      Intervention Provide education and explanation of THRR including how the numbers were predicted and where they are located for reference Provide education and explanation of THRR including how the numbers were predicted and where they are located for reference      Expected Outcomes Short Term: Able to state/look up THRR;Long Term: Able to use THRR to govern intensity when exercising independently;Short Term: Able to use daily as guideline for intensity in rehab Short Term: Able to state/look up THRR;Long Term: Able to use THRR to govern intensity when exercising independently;Short Term: Able to use daily as guideline for intensity in rehab      Able to check pulse independently Yes Yes      Intervention Provide education and demonstration on how to check pulse in carotid and radial arteries.;Review the importance of being able to check your own pulse for safety during independent exercise Provide education and demonstration on how to check pulse in carotid and radial arteries.;Review the importance of being able to check your own pulse for safety during independent exercise      Expected Outcomes Short Term: Able to explain why pulse checking is important during independent exercise;Long Term: Able to check pulse independently and accurately Short Term: Able to explain why pulse checking is important during independent exercise;Long Term: Able to check pulse independently and accurately      Understanding of Exercise Prescription Yes  Yes      Intervention Provide education, explanation, and written materials on patient's individual exercise prescription Provide education, explanation, and written materials on patient's individual exercise prescription      Expected Outcomes Short Term: Able to explain program exercise prescription;Long Term: Able to explain home exercise prescription to exercise independently Short Term: Able to explain program exercise prescription;Long Term: Able to explain home exercise prescription to exercise independently               Exercise Goals Re-Evaluation:  Exercise Goals Re-Evaluation     Row Name 08/23/21 1540             Exercise Goal Re-Evaluation   Exercise Goals Review Increase Physical Activity;Increase Strength and Stamina;Able to understand and use rate of perceived exertion (RPE) scale;Knowledge and understanding of Target Heart Rate Range (THRR);Able to check pulse independently;Understanding of Exercise Prescription       Comments Pt has completed 5 sessions of cardiac rehab. He is motivated to come to class and pushes himself during class to increase his workload. He is currently exercising a t2.53 METs on the treadmill. Will contiune to monitor and progress as able.       Expected Outcomes Through exercise at rehab and at home, the patient will meet their stated goals.                Nutrition & Weight -  Outcomes:  Pre Biometrics - 08/06/21 1312       Pre Biometrics   Height '5\' 10"'$  (1.778 m)    Weight 205 lb 7.5 oz (93.2 kg)    Waist Circumference 43 inches    Hip Circumference 41 inches    Waist to Hip Ratio 1.05 %    BMI (Calculated) 29.48    Triceps Skinfold 20 mm    % Body Fat 30.1 %    Grip Strength 38.3 kg    Flexibility 0 in    Single Leg Stand 27.13 seconds              Nutrition:  Nutrition Therapy & Goals - 08/16/21 0908       Personal Nutrition Goals   Comments We offer 2 educational sessions on heart healthy nutrition with  handouts.      Intervention Plan   Intervention Nutrition handout(s) given to patient.    Expected Outcomes Short Term Goal: Understand basic principles of dietary content, such as calories, fat, sodium, cholesterol and nutrients.             Nutrition Discharge:   Education Questionnaire Score:  Knowledge Questionnaire Score - 08/06/21 1249       Knowledge Questionnaire Score   Pre Score 28/28             Pt discharged from CR after 14 sessions. He got a job, and he is now unable to complete rehab.

## 2021-09-22 ENCOUNTER — Encounter (HOSPITAL_COMMUNITY): Payer: BC Managed Care – PPO

## 2021-09-24 ENCOUNTER — Encounter (HOSPITAL_COMMUNITY): Payer: BC Managed Care – PPO

## 2021-09-27 ENCOUNTER — Encounter (HOSPITAL_COMMUNITY): Payer: BC Managed Care – PPO

## 2021-09-29 ENCOUNTER — Encounter (HOSPITAL_COMMUNITY): Payer: BC Managed Care – PPO

## 2021-10-01 ENCOUNTER — Encounter (HOSPITAL_COMMUNITY): Payer: BC Managed Care – PPO

## 2021-10-04 ENCOUNTER — Encounter (HOSPITAL_COMMUNITY): Payer: BC Managed Care – PPO

## 2021-10-06 ENCOUNTER — Encounter (HOSPITAL_COMMUNITY): Payer: BC Managed Care – PPO

## 2021-10-08 ENCOUNTER — Encounter (HOSPITAL_COMMUNITY): Payer: BC Managed Care – PPO

## 2021-10-11 ENCOUNTER — Encounter (HOSPITAL_COMMUNITY): Payer: BC Managed Care – PPO

## 2021-10-13 ENCOUNTER — Encounter (HOSPITAL_COMMUNITY): Payer: BC Managed Care – PPO

## 2021-10-15 ENCOUNTER — Encounter (HOSPITAL_COMMUNITY): Payer: BC Managed Care – PPO

## 2021-10-17 ENCOUNTER — Encounter: Payer: Self-pay | Admitting: Emergency Medicine

## 2021-10-17 ENCOUNTER — Ambulatory Visit
Admission: EM | Admit: 2021-10-17 | Discharge: 2021-10-17 | Disposition: A | Discharge status: Home / Self Care | Payer: BC Managed Care – PPO | Attending: Physician Assistant | Admitting: Physician Assistant

## 2021-10-17 DIAGNOSIS — L03011 Cellulitis of right finger: Secondary | ICD-10-CM

## 2021-10-17 MED ORDER — AMOXICILLIN-POT CLAVULANATE 875-125 MG PO TABS: 1.0000 | ORAL_TABLET | Freq: Two times a day (BID) | ORAL | 0 refills | Status: DC

## 2021-10-17 MED ORDER — MUPIROCIN 2 % EX OINT
1.0000 | TOPICAL_OINTMENT | Freq: Two times a day (BID) | CUTANEOUS | 0 refills | Status: DC
Start: 2021-10-17 — End: 2021-11-05

## 2021-10-17 NOTE — ED Triage Notes (Signed)
Patient c/o finger infection in his right middle finger x 3 days.  The area is red, swollen and painful.  Denies any OTC pain meds.

## 2021-10-17 NOTE — Discharge Instructions (Signed)
We drained your paronychia during your visit today.  Soak this in warm salty water a few times per day.  Keep clean with soap and water and apply Bactroban ointment with dressing changes.  Start Augmentin twice daily.  Use Tylenol for pain relief.  If you have any recurrent symptoms including reaccumulation of fluid, fever, increased pain, numbness in your finger, trouble moving it you need to be seen immediately.

## 2021-10-17 NOTE — ED Notes (Signed)
Bacitracin applied to site, telfa wrapped around site and secured with coban. Pt tolerated well. Site management and infection prevention education provided and pt verbalized understanding.

## 2021-10-17 NOTE — ED Provider Notes (Signed)
[facility]    CSN: [id number] Arrival date & time: 10/17/21  3646      History   Chief Complaint Chief Complaint  Patient presents with   Finger infection    HPI Derrick Turner is a 51 y.o. male.   Patient presents today with 3-day history of swelling and pain around his medial right middle finger nailbed.  He does not bite his fingers but does report that he often will pull off his piece of nail if it is broken.  He denies any recent known injuries.  He has not been trying anything over-the-counter for symptom management.  He denies any recent antibiotic use.  He is left-handed.  Denies any numbness, paresthesias, decreased range of motion.  Denies any fever, nausea, vomiting.  He denies history of diabetes or immunosuppression.  Reports pain is rated 3 at rest but increases significantly with palpation, localized to affected area, described as throbbing, no aggravating relieving factors identified.    Past Medical History:  Diagnosis Date   Coronary artery disease cardiologist--- dr croitoru   ETT 04-01-2016 in epic, reproduced chest pain but no EKG changes;  cardiac cath 04-06-2016 moderate diffuse nonobstructive disease, aggressive medical management   History of 2019 novel coronavirus disease (COVID-19) 03/02/2020   per pt tested positive covid, copy of result in epic, stated mild symptoms that resolved   History of kidney stones    Incomplete right bundle branch block    Right ureteral calculus    Wears glasses     Patient Active Problem List   Diagnosis Date Noted   Tinnitus, bilateral 08/04/2021   Atypical chest pain    Acute MI, 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[place])    S/P partial colectomy 09/02/2020   Dysplastic colon polyp    Diverticulitis of colon without hemorrhage 06/23/2020   Abnormal CT scan, colon 06/23/2020   Dyslipidemia 05/03/2016   CAD (coronary artery disease) 05/03/2016   Tobacco abuse    Prediabetes    Gastroesophageal reflux disease     Exertional chest pain 03/31/2016   ACS (acute coronary syndrome) ([place]) 03/31/2016    Past Surgical History:  Procedure Laterality Date   APPENDECTOMY  teen   BACK SURGERY     two   BIOPSY  08/11/2020   Procedure: BIOPSY;  Surgeon: Eloise Harman, DO;  [facility];  Service: Endoscopy;;   COLONOSCOPY WITH PROPOFOL N/A 08/11/2020   Procedure: COLONOSCOPY WITH PROPOFOL;  Surgeon: Eloise Harman, DO;  [facility];  Service: Endoscopy;  Laterality: N/A;  7:30am   CORONARY/GRAFT ACUTE MI REVASCULARIZATION N/A 07/16/2021   Procedure: Coronary/Graft Acute MI Revascularization;  Surgeon: Burnell Blanks, MD;  Location: [facility];  Service: Cardiovascular;  Laterality: N/A;   CYSTOSCOPY WITH RETROGRADE PYELOGRAM, URETEROSCOPY AND STENT PLACEMENT Right 04/14/2020   Procedure: CYSTOSCOPY WITH RETROGRADE PYELOGRAM, URETEROSCOPY AND STENT PLACEMENT;  Surgeon: Remi Haggard, MD;  Location: [facility];  Service: Urology;  Laterality: Right;   EXTRACORPOREAL SHOCK WAVE LITHOTRIPSY Right 03/23/2020   Procedure: EXTRACORPOREAL SHOCK WAVE LITHOTRIPSY (ESWL);  Surgeon: Lucas Mallow, MD;  Location: [facility];  Service: Urology;  Laterality: Right;   HOLMIUM LASER APPLICATION Right 80/32/1224   Procedure: HOLMIUM LASER APPLICATION;  Surgeon: Remi Haggard, MD;  Location: [facility];  Service: Urology;  Laterality: Right;   LAPAROSCOPIC CHOLECYSTECTOMY  2012 approx   LEFT HEART CATH AND CORONARY ANGIOGRAPHY N/A 04/06/2016   Procedure: Left  Heart Cath and Coronary Angiography;  Surgeon: Sherren Mocha, MD;  Location: [facility];  Service: Cardiovascular;  Laterality: N/A;   LEFT HEART CATH AND CORONARY ANGIOGRAPHY N/A 07/16/2021   Procedure: LEFT HEART CATH AND CORONARY ANGIOGRAPHY;  Surgeon: Burnell Blanks, MD;  Location: [facility];  Service: Cardiovascular;  Laterality: N/A;    ORCHIECTOMY Right 1990s   per pt benign cyst   PARTIAL COLECTOMY Right 09/02/2020   Procedure: HEMICOLECTOMY;  Surgeon: Aviva Signs, MD;  Location: [facility];  Service: General;  Laterality: Right;   POLYPECTOMY  08/11/2020   Procedure: POLYPECTOMY;  Surgeon: Eloise Harman, DO;  [facility];  Service: Endoscopy;;   SHOULDER SURGERY Bilateral left 2019;  right 2016   SHOULDER SURGERY Left    SHOULDER SURGERY Right        Home Medications    Prior to Admission medications   Medication Sig Start Date End Date Taking? Authorizing Provider  acetaminophen (TYLENOL) 500 MG tablet Take 1,000-1,500 mg by mouth daily as needed (headache/pain.).   Yes [provider]  amoxicillin-clavulanate (AUGMENTIN) 875-125 MG tablet Take 1 tablet by mouth every 12 (twelve) hours. 10/17/21  Yes Bayani Renteria, Derry Skill, PA-C  aspirin EC 81 MG tablet Take 1 tablet (81 mg total) by mouth daily. Swallow whole. 07/17/21 07/17/22 Yes Duke, Tami Lin, PA  atorvastatin (LIPITOR) 40 MG tablet TAKE 1 TABLET BY MOUTH EVERY DAY 09/15/21  Yes Croitoru, Mihai, MD  cetirizine (ZYRTEC) 10 MG tablet Take 10 mg by mouth daily.   Yes [provider]  docusate sodium (COLACE) 100 MG capsule Take 100 mg by mouth at bedtime.   Yes [provider]  Melatonin 10 MG TABS Take 20 mg by mouth at bedtime.   Yes [provider]  mupirocin ointment (BACTROBAN) 2 % Apply 1 Application topically 2 (two) times daily. 10/17/21  Yes Camera Krienke K, PA-C  nitroGLYCERIN (NITROSTAT) 0.4 MG SL tablet Place 1 tablet (0.4 mg total) under the tongue every 5 (five) minutes as needed for chest pain. 07/17/21 07/17/22 Yes Duke, Tami Lin, PA  Probiotic Product (PROBIOTIC PO) Take 1 capsule by mouth daily.   Yes [provider]  ticagrelor (BRILINTA) 90 MG TABS tablet Take 1 tablet (90 mg total) by mouth 2 (two) times daily. 07/30/21  Yes Lendon Colonel, NP  atorvastatin (LIPITOR) 80 MG tablet Take 1 tablet  (80 mg total) by mouth daily. 07/18/21   Duke, Tami Lin, PA  ondansetron (ZOFRAN) 4 MG tablet Take 1 tablet (4 mg total) by mouth every 6 (six) hours as needed for nausea or vomiting. Patient not taking: Reported on 08/04/2021 06/23/20   Mahala Menghini, PA-C    Family History Family History  Problem Relation Age of Onset   Heart attack Father    Heart attack Maternal Grandmother    Heart attack Maternal Grandfather    Colon cancer Neg Hx    Colon polyps Neg Hx     Social History Social History   Tobacco Use   Smoking status: Every Day    Packs/day: 1.50    Years: 35.00    Total pack years: 52.50    Types: Cigarettes   Smokeless tobacco: Never  Vaping Use   Vaping Use: Never used  Substance Use Topics   Alcohol use: Yes    Comment: occasional   Drug use: Never     Allergies   Patient has no known allergies.   Review of  Systems Review of Systems  Constitutional:  Positive for activity change. Negative for appetite change, fatigue and fever.  Gastrointestinal:  Negative for abdominal pain, diarrhea, nausea and vomiting.  Skin:  Positive for color change. Negative for wound.  Neurological:  Negative for dizziness, weakness, light-headedness, numbness and headaches.     Physical Exam Triage Vital Signs ED Triage Vitals [10/17/21 0905]  Enc Vitals Group     BP 114/72     Pulse Rate (!) 58     Resp 18     Temp 98.2 F (36.8 C)     Temp Source Oral     SpO2 98 %     Weight 210 lb (95.3 kg)     Height '5\' 10"'$  (1.778 m)     Head Circumference      Peak Flow      Pain Score 3     Pain Loc      Pain Edu?      Excl. in [place]?    No data found.  Updated Vital Signs BP 114/72 (BP Location: Right Arm)   Pulse (!) 58   Temp 98.2 F (36.8 C) (Oral)   Resp 18   Ht '5\' 10"'$  (1.778 m)   Wt 210 lb (95.3 kg)   SpO2 98%   BMI 30.13 kg/m   Visual Acuity Right Eye Distance:   Left Eye Distance:   Bilateral Distance:    Right Eye Near:   Left Eye Near:     Bilateral Near:     Physical Exam Vitals reviewed.  Constitutional:      General: He is awake.     Appearance: Normal appearance. He is well-developed. He is not ill-appearing.     Comments: Very pleasant male presented age in no acute distress sitting comfortably in exam room  HENT:     Head: Normocephalic and atraumatic.  Cardiovascular:     Rate and Rhythm: Normal rate and regular rhythm.     Heart sounds: Normal heart sounds, S1 normal and S2 normal. No murmur heard. Pulmonary:     Effort: Pulmonary effort is normal.     Breath sounds: Normal breath sounds. No stridor. No wheezing, rhonchi or rales.     Comments: Clear to auscultation bilaterally Skin:    Comments: Accumulation of purulence noted at medial right middle finger nail fold with associated erythema and swelling.  Area is tender to palpation.  Neurological:     Mental Status: He is alert.  Psychiatric:        Behavior: Behavior is cooperative.      UC Treatments / Results  Labs (all labs ordered are listed, but only abnormal results are displayed) Labs Reviewed - No data to display  EKG   Radiology No results found.  Procedures Incision and Drainage  Date/Time: 10/17/2021 9:26 AM  Performed by: Terrilee Croak, PA-C Authorized by: Terrilee Croak, PA-C   Consent:    Consent obtained:  Verbal   Consent given by:  Patient   Risks discussed:  Bleeding, incomplete drainage and infection   Alternatives discussed:  Observation and alternative treatment Universal protocol:    Patient identity confirmed:  Verbally with patient Location:    Type:  Fluid collection   Size:  1 cm x 0.5 cm   Location:  Upper extremity   Upper extremity location:  Finger   Finger location:  R long finger Pre-procedure details:    Skin preparation:  Chlorhexidine with alcohol Sedation:  Sedation type:  None Anesthesia:    Anesthesia method:  None Procedure type:    Complexity:  Simple Procedure details:     Ultrasound guidance: no     Needle aspiration: no     Incision types:  Stab incision   Drainage:  Purulent   Drainage amount:  Moderate   Wound treatment:  Wound left open Post-procedure details:    Procedure completion:  Tolerated  (including critical care time)  Medications Ordered in UC Medications - No data to display  Initial Impression / Assessment and Plan / UC Course  I have reviewed the triage vital signs and the nursing notes.  Pertinent labs & imaging results that were available during my care of the patient were reviewed by me and considered in my medical decision making (see chart for details).     Paronychia was drained in clinic.  Patient tolerated this well.  He was encouraged to use warm compresses and soaks to encourage ongoing drainage.  He is to keep area clean with soap and water and apply Bactroban ointment with dressing changes twice daily.  We will start Augmentin twice daily.  He was instructed to use Tylenol for pain relief.  Discussed that if he has any worsening symptoms including swelling of finger, reaccumulation of fluid, numbness, tingling he needs to be seen immediately.  Strict return precautions given to which he expressed understanding.  Patient declined work excuse note.  Final Clinical Impressions(s) / UC Diagnoses   Final diagnoses:  Paronychia of right middle finger     Discharge Instructions      We drained your paronychia during your visit today.  Soak this in warm salty water a few times per day.  Keep clean with soap and water and apply Bactroban ointment with dressing changes.  Start Augmentin twice daily.  Use Tylenol for pain relief.  If you have any recurrent symptoms including reaccumulation of fluid, fever, increased pain, numbness in your finger, trouble moving it you need to be seen immediately.     ED Prescriptions     Medication Sig Dispense Auth. Provider   mupirocin ointment (BACTROBAN) 2 % Apply 1 Application topically 2  (two) times daily. 22 g Keinan Brouillet K, PA-C   amoxicillin-clavulanate (AUGMENTIN) 875-125 MG tablet Take 1 tablet by mouth every 12 (twelve) hours. 14 tablet Rickardo Brinegar, Derry Skill, PA-C      PDMP not reviewed this encounter.   Terrilee Croak, PA-C 10/17/21 0601

## 2021-10-18 ENCOUNTER — Encounter (HOSPITAL_COMMUNITY): Payer: BC Managed Care – PPO

## 2021-10-20 ENCOUNTER — Encounter (HOSPITAL_COMMUNITY): Payer: BC Managed Care – PPO

## 2021-10-22 ENCOUNTER — Encounter (HOSPITAL_COMMUNITY): Payer: BC Managed Care – PPO

## 2021-10-25 ENCOUNTER — Encounter (HOSPITAL_COMMUNITY): Payer: BC Managed Care – PPO

## 2021-10-27 ENCOUNTER — Encounter (HOSPITAL_COMMUNITY): Payer: BC Managed Care – PPO

## 2021-10-29 ENCOUNTER — Encounter (HOSPITAL_COMMUNITY): Payer: BC Managed Care – PPO

## 2021-11-01 ENCOUNTER — Encounter (HOSPITAL_COMMUNITY): Payer: BC Managed Care – PPO

## 2021-11-03 ENCOUNTER — Encounter (HOSPITAL_COMMUNITY): Payer: BC Managed Care – PPO

## 2021-11-05 ENCOUNTER — Encounter: Payer: Self-pay | Admitting: Cardiovascular Disease

## 2021-11-05 ENCOUNTER — Ambulatory Visit: Payer: BC Managed Care – PPO | Attending: Cardiovascular Disease | Admitting: Cardiovascular Disease

## 2021-11-05 ENCOUNTER — Ambulatory Visit: Payer: BC Managed Care – PPO | Admitting: Family Medicine

## 2021-11-05 ENCOUNTER — Encounter: Payer: Self-pay | Admitting: Family Medicine

## 2021-11-05 VITALS — BP 120/64 | HR 70 | Ht 70.0 in | Wt 202.0 lb

## 2021-11-05 VITALS — BP 115/74 | HR 79 | Ht 70.0 in | Wt 211.1 lb

## 2021-11-05 DIAGNOSIS — Z23 Encounter for immunization: Secondary | ICD-10-CM | POA: Diagnosis not present

## 2021-11-05 DIAGNOSIS — E559 Vitamin D deficiency, unspecified: Secondary | ICD-10-CM | POA: Diagnosis not present

## 2021-11-05 DIAGNOSIS — R7301 Impaired fasting glucose: Secondary | ICD-10-CM | POA: Diagnosis not present

## 2021-11-05 DIAGNOSIS — I2510 Atherosclerotic heart disease of native coronary artery without angina pectoris: Secondary | ICD-10-CM

## 2021-11-05 DIAGNOSIS — H9392 Unspecified disorder of left ear: Secondary | ICD-10-CM | POA: Diagnosis not present

## 2021-11-05 DIAGNOSIS — H9313 Tinnitus, bilateral: Secondary | ICD-10-CM

## 2021-11-05 DIAGNOSIS — F17200 Nicotine dependence, unspecified, uncomplicated: Secondary | ICD-10-CM | POA: Diagnosis not present

## 2021-11-05 DIAGNOSIS — E785 Hyperlipidemia, unspecified: Secondary | ICD-10-CM

## 2021-11-05 DIAGNOSIS — J302 Other seasonal allergic rhinitis: Secondary | ICD-10-CM

## 2021-11-05 DIAGNOSIS — J069 Acute upper respiratory infection, unspecified: Secondary | ICD-10-CM | POA: Diagnosis not present

## 2021-11-05 DIAGNOSIS — E663 Overweight: Secondary | ICD-10-CM

## 2021-11-05 DIAGNOSIS — J309 Allergic rhinitis, unspecified: Secondary | ICD-10-CM | POA: Insufficient documentation

## 2021-11-05 DIAGNOSIS — J301 Allergic rhinitis due to pollen: Secondary | ICD-10-CM

## 2021-11-05 MED ORDER — AZELASTINE HCL 0.1 % NA SOLN: 2.0000 | Freq: Two times a day (BID) | NASAL | 0 refills | Status: DC

## 2021-11-05 MED ORDER — DEXTROMETHORPHAN HBR 15 MG/5ML PO SYRP: 10.0000 mL | ORAL_SOLUTION | Freq: Four times a day (QID) | ORAL | 0 refills | Status: DC | PRN

## 2021-11-05 NOTE — Assessment & Plan Note (Signed)
Reports seasonal allergies  He complains of nasal congestion, cough, sneezing, and sinus pressure Onset of symptoms on 11/04/2018 He denies fever, chills, and sore throat Will be treated with Robitussin for cough Azelastine nasal spray to improve nasal passage patency Encouraged to take oral levocetirizine 5 mg for allergy symptoms

## 2021-11-05 NOTE — Progress Notes (Signed)
Established Patient Office Visit  Subjective:  Patient ID: Derrick Turner, male    DOB: 1970/11/30  Age: 51 y.o. MRN: [id number]  CC:  Chief Complaint  Patient presents with   Follow-up    3 month f/u, c/o cold, sx of nasal congestion, and facial pressure, sx started on 09/27, did a covid test at home was Neg.    Skin Problem    Pt c/o bump on left ear that he would like to have looked at. Has been there for several years.     HPI Derrick Turner Thoma is a 51 y.o. male with past medical history of Dyslipidemia and Tinnitus, presents for f/u of  chronic medical conditions.  Bilateral Tinnitus: A referral was placed to ENT on 08/04/2021 for bilateral tinnitus.  He reports having issues with his phone, noting that he probably missed when he was contacted.  Dyslipidemia: He takes atorvastatin 80 mg tablet daily.  He denies chest pain, palpitations, shortness of breath, and chest tightness.  Left ear lesion: He reports a lesion on his left ear that has been present for three years.  He denies changes in the lesion size, noting only having pain with his hat on.    Allergic rhinitis: Reports seasonal allergies.  He complains of nasal congestion, cough, sneezing, and sinus pressure. Onset of symptoms on 11/04/2018.  He denies fever, chills, and sore throat.     Past Medical History:  Diagnosis Date   Coronary artery disease cardiologist--- dr croitoru   ETT 04-01-2016 in epic, reproduced chest pain but no EKG changes;  cardiac cath 04-06-2016 moderate diffuse nonobstructive disease, aggressive medical management   History of 2019 novel coronavirus disease (COVID-19) 03/02/2020   per pt tested positive covid, copy of result in epic, stated mild symptoms that resolved   History of kidney stones    Incomplete right bundle branch block    Right ureteral calculus    Wears glasses     Past Surgical History:  Procedure Laterality Date   APPENDECTOMY  teen   BACK SURGERY     two   BIOPSY   08/11/2020   Procedure: BIOPSY;  Surgeon: Eloise Harman, DO;  [facility];  Service: Endoscopy;;   COLONOSCOPY WITH PROPOFOL N/A 08/11/2020   Procedure: COLONOSCOPY WITH PROPOFOL;  Surgeon: Eloise Harman, DO;  [facility];  Service: Endoscopy;  Laterality: N/A;  7:30am   CORONARY/GRAFT ACUTE MI REVASCULARIZATION N/A 07/16/2021   Procedure: Coronary/Graft Acute MI Revascularization;  Surgeon: Burnell Blanks, MD;  Location: [facility];  Service: Cardiovascular;  Laterality: N/A;   CYSTOSCOPY WITH RETROGRADE PYELOGRAM, URETEROSCOPY AND STENT PLACEMENT Right 04/14/2020   Procedure: CYSTOSCOPY WITH RETROGRADE PYELOGRAM, URETEROSCOPY AND STENT PLACEMENT;  Surgeon: Remi Haggard, MD;  Location: [facility];  Service: Urology;  Laterality: Right;   EXTRACORPOREAL SHOCK WAVE LITHOTRIPSY Right 03/23/2020   Procedure: EXTRACORPOREAL SHOCK WAVE LITHOTRIPSY (ESWL);  Surgeon: Lucas Mallow, MD;  Location: [facility];  Service: Urology;  Laterality: Right;   HOLMIUM LASER APPLICATION Right 44/62/8638   Procedure: HOLMIUM LASER APPLICATION;  Surgeon: Remi Haggard, MD;  Location: [facility];  Service: Urology;  Laterality: Right;   LAPAROSCOPIC CHOLECYSTECTOMY  2012 approx   LEFT HEART CATH AND CORONARY ANGIOGRAPHY N/A 04/06/2016   Procedure: Left Heart Cath and Coronary Angiography;  Surgeon: Sherren Mocha, MD;  Location: [facility];  Service: Cardiovascular;  Laterality: N/A;  LEFT HEART CATH AND CORONARY ANGIOGRAPHY N/A 07/16/2021   Procedure: LEFT HEART CATH AND CORONARY ANGIOGRAPHY;  Surgeon: Burnell Blanks, MD;  Location: [facility];  Service: Cardiovascular;  Laterality: N/A;   ORCHIECTOMY Right 1990s   per pt benign cyst   PARTIAL COLECTOMY Right 09/02/2020   Procedure: HEMICOLECTOMY;  Surgeon: Aviva Signs, MD;  [facility];  Service: General;  Laterality: Right;    POLYPECTOMY  08/11/2020   Procedure: POLYPECTOMY;  Surgeon: Eloise Harman, DO;  [facility];  Service: Endoscopy;;   SHOULDER SURGERY Bilateral left 2019;  right 2016   SHOULDER SURGERY Left    SHOULDER SURGERY Right     Family History  Problem Relation Age of Onset   Heart attack Father    Heart attack Maternal Grandmother    Heart attack Maternal Grandfather    Colon cancer Neg Hx    Colon polyps Neg Hx     Social History   Socioeconomic History   Marital status: Married    Spouse name: Not on file   Number of children: Not on file   Years of education: Not on file   Highest education level: Not on file  Occupational History   Not on file  Tobacco Use   Smoking status: Every Day    Packs/day: 1.50    Years: 35.00    Total pack years: 52.50    Types: Cigarettes   Smokeless tobacco: Never  Vaping Use   Vaping Use: Never used  Substance and Sexual Activity   Alcohol use: Yes    Comment: occasional   Drug use: Never   Sexual activity: Not on file  Other Topics Concern   Not on file  Social History Narrative   Not on file   Social Determinants of Health   Financial Resource Strain: Not on file  Food Insecurity: No Food Insecurity (07/17/2021)   Hunger Vital Sign    Worried About Running Out of Food in the Last Year: Never true    Ran Out of Food in the Last Year: Never true  Transportation Needs: No Transportation Needs (07/17/2021)   PRAPARE - Hydrologist (Medical): No    Lack of Transportation (Non-Medical): No  Physical Activity: Not on file  Stress: Not on file  Social Connections: Not on file  Intimate Partner Violence: Not on file    Outpatient Medications Prior to Visit  Medication Sig Dispense Refill   acetaminophen (TYLENOL) 500 MG tablet Take 1,000-1,500 mg by mouth daily as needed (headache/pain.).     aspirin EC 81 MG tablet Take 1 tablet (81 mg total) by mouth daily. Swallow whole. 150 tablet 2    atorvastatin (LIPITOR) 80 MG tablet Take 1 tablet (80 mg total) by mouth daily. 90 tablet 3   cetirizine (ZYRTEC) 10 MG tablet Take 10 mg by mouth daily.     docusate sodium (COLACE) 100 MG capsule Take 100 mg by mouth at bedtime.     Melatonin 10 MG TABS Take 20 mg by mouth at bedtime.     nitroGLYCERIN (NITROSTAT) 0.4 MG SL tablet Place 1 tablet (0.4 mg total) under the tongue every 5 (five) minutes as needed for chest pain. 25 tablet 3   ondansetron (ZOFRAN) 4 MG tablet Take 1 tablet (4 mg total) by mouth every 6 (six) hours as needed for nausea or vomiting. 20 tablet 0   Probiotic Product (PROBIOTIC PO) Take 1 capsule by mouth daily.  ticagrelor (BRILINTA) 90 MG TABS tablet Take 1 tablet (90 mg total) by mouth 2 (two) times daily. 32 tablet 0   No facility-administered medications prior to visit.    No Known Allergies  ROS Review of Systems  Constitutional:  Negative for chills and fever.  HENT:  Positive for congestion, sinus pressure and sneezing. Negative for sore throat.   Eyes:  Negative for visual disturbance.  Respiratory:  Positive for cough.   Neurological:  Negative for dizziness and headaches.      Objective:    Physical Exam HENT:     Head: Normocephalic.  Cardiovascular:     Rate and Rhythm: Normal rate and regular rhythm.     Pulses: Normal pulses.     Heart sounds: Normal heart sounds.  Pulmonary:     Effort: Pulmonary effort is normal.     Breath sounds: Normal breath sounds.  Neurological:     Mental Status: He is alert.     BP 115/74   Pulse 79   Ht 5' 10"  (1.778 m)   Wt 211 lb 1.9 oz (95.8 kg)   SpO2 96%   BMI 30.29 kg/m  Wt Readings from Last 3 Encounters:  11/05/21 211 lb 1.9 oz (95.8 kg)  11/05/21 202 lb (91.6 kg)  10/17/21 210 lb (95.3 kg)    Lab Results  Component Value Date   TSH 1.333 03/31/2016   Lab Results  Component Value Date   WBC 9.4 07/27/2021   HGB 16.7 07/27/2021   HCT 47.5 07/27/2021   MCV 85.7 07/27/2021    PLT 196 07/27/2021   Lab Results  Component Value Date   NA 139 07/27/2021   K 4.2 07/27/2021   CO2 23 07/27/2021   GLUCOSE 113 (H) 07/27/2021   BUN 15 07/27/2021   CREATININE 0.59 (L) 07/27/2021   BILITOT 0.7 08/31/2020   ALKPHOS 70 08/31/2020   AST 17 08/31/2020   ALT 18 08/31/2020   PROT 7.3 08/31/2020   ALBUMIN 4.3 08/31/2020   CALCIUM 9.7 07/27/2021   ANIONGAP 14 07/27/2021   Lab Results  Component Value Date   CHOL 78 07/17/2021   Lab Results  Component Value Date   HDL 35 (L) 07/17/2021   Lab Results  Component Value Date   LDLCALC 26 07/17/2021   Lab Results  Component Value Date   TRIG 84 07/17/2021   Lab Results  Component Value Date   CHOLHDL 2.2 07/17/2021   Lab Results  Component Value Date   HGBA1C 5.7 (H) 03/31/2016      Assessment & Plan:   Problem List Items Addressed This Visit       Respiratory   Allergic rhinitis    Reports seasonal allergies  He complains of nasal congestion, cough, sneezing, and sinus pressure Onset of symptoms on 11/04/2018 He denies fever, chills, and sore throat Will be treated with Robitussin for cough Azelastine nasal spray to improve nasal passage patency Encouraged to take oral levocetirizine 5 mg for allergy symptoms       Relevant Medications   azelastine (ASTELIN) 0.1 % nasal spray   dextromethorphan 15 MG/5ML syrup     Nervous and Auditory   Lesion of left ear - Primary    He reports a lesion on his left ear that has been present for three years He denies changes in the lesion size, noting only having pain with his hat on Informed the patient that the lesion is benign, but he would like a referral to  dermatology for a second opinion Referral placed to dermatology      Relevant Orders   Ambulatory referral to Dermatology     Other   Dyslipidemia    He takes atorvastatin 80 mg tablet daily He denies chest pain, palpitations, shortness of breath, and chest tightness Encouraged to continue  treatment regimen Pending labs      Tinnitus, bilateral    A referral was placed to ENT on 08/04/2021 for bilateral tinnitus He reports having issues with his phone, noting that he probably missed when he was contacted Information provided to the patient to contact ENT and schedule an appointment       Other Visit Diagnoses     Immunization due       Relevant Orders   Varicella-zoster vaccine IM (Completed)   Vitamin D deficiency       Relevant Orders   Vitamin D (25 hydroxy)   IFG (impaired fasting glucose)       Relevant Orders   CBC with Differential/Platelet   CMP14+EGFR   TSH + free T4   Lipid Profile   Hemoglobin A1C   URI, acute           Meds ordered this encounter  Medications   azelastine (ASTELIN) 0.1 % nasal spray    Sig: Place 2 sprays into both nostrils 2 (two) times daily. Use in each nostril as directed    Dispense:  30 mL    Refill:  0   dextromethorphan 15 MG/5ML syrup    Sig: Take 10 mLs (30 mg total) by mouth 4 (four) times daily as needed for cough.    Dispense:  120 mL    Refill:  0    Follow-up: Return in about 4 months (around 03/07/2022).    Alvira Monday, FNP

## 2021-11-05 NOTE — Assessment & Plan Note (Signed)
He takes atorvastatin 80 mg tablet daily He denies chest pain, palpitations, shortness of breath, and chest tightness Encouraged to continue treatment regimen Pending labs

## 2021-11-05 NOTE — Progress Notes (Signed)
Cardiology Office Note:    Date:  11/05/2021   ID:  [name], DOB 1971/02/07, MRN [id number]  PCP:  Alvira Monday, FNP  Cardiologist:  Sanda Klein, MD    Referring MD: Alvira Monday, [place]   Chief Complaint  Patient presents with   Follow-up    3 months.    History of Present Illness:    Derrick Turner is a 51 y.o. male smoker with a hx of moderate CAD by cath in February 2018, presented with acute STEMI due to occlusion of the mid LAD artery 07/16/2021 treated with emergency PCI (SYNERGY XD 4.0X24.).  He did not have severe blockages in any other of the coronary arteries.  Post MI echocardiogram showed normal left ventricular regional wall motion and overall systolic function.    He is trying to quit smoking.  He is down to 5 cigarettes a day, but finds it very hard to kick the habit altogether.  Usually smokes after a meal.  His wife smokes as well.  He is taking statins and at the time of his heart attack his LDL cholesterol was very low at 26.  He also has a low HDL of 35.  He does not have hypertension or diabetes mellitus.  His father had early onset CAD as well.    The patient specifically denies any chest pain at rest or with exertion, dyspnea at rest or with exertion, orthopnea, paroxysmal nocturnal dyspnea, syncope, palpitations, focal neurological deficits, intermittent claudication, lower extremity edema, unexplained weight gain.he caught a cold while fishing at [employer] and has a mild cough productive of scanty amounts of sputum, no hemoptysis.  Does have some wheezing.  Denies fever or chills.  Has not had any serious bleeding problems on Brilinta.   Past Medical History:  Diagnosis Date   Coronary artery disease cardiologist--- dr Emma Birchler   ETT 04-01-2016 in epic, reproduced chest pain but no EKG changes;  cardiac cath 04-06-2016 moderate diffuse nonobstructive disease, aggressive medical management   History of 2019 novel coronavirus disease (COVID-19)  03/02/2020   per pt tested positive covid, copy of result in epic, stated mild symptoms that resolved   History of kidney stones    Incomplete right bundle branch block    Right ureteral calculus    Wears glasses     Past Surgical History:  Procedure Laterality Date   APPENDECTOMY  teen   BACK SURGERY     two   BIOPSY  08/11/2020   Procedure: BIOPSY;  Surgeon: Eloise Harman, DO;  [facility];  Service: Endoscopy;;   COLONOSCOPY WITH PROPOFOL N/A 08/11/2020   Procedure: COLONOSCOPY WITH PROPOFOL;  Surgeon: Eloise Harman, DO;  [facility];  Service: Endoscopy;  Laterality: N/A;  7:30am   CORONARY/GRAFT ACUTE MI REVASCULARIZATION N/A 07/16/2021   Procedure: Coronary/Graft Acute MI Revascularization;  Surgeon: Burnell Blanks, MD;  Location: [place] [facility];  Service: Cardiovascular;  Laterality: N/A;   CYSTOSCOPY WITH RETROGRADE PYELOGRAM, URETEROSCOPY AND STENT PLACEMENT Right 04/14/2020   Procedure: CYSTOSCOPY WITH RETROGRADE PYELOGRAM, URETEROSCOPY AND STENT PLACEMENT;  Surgeon: Remi Haggard, MD;  Location: [facility];  Service: Urology;  Laterality: Right;   EXTRACORPOREAL SHOCK WAVE LITHOTRIPSY Right 03/23/2020   Procedure: EXTRACORPOREAL SHOCK WAVE LITHOTRIPSY (ESWL);  Surgeon: Lucas Mallow, MD;  Location: [facility];  Service: Urology;  Laterality: Right;   HOLMIUM LASER APPLICATION Right 69/48/5462   Procedure: HOLMIUM LASER APPLICATION;  Surgeon: Milford Cage,  Jamal Collin, MD;  Location: [facility];  Service: Urology;  Laterality: Right;   LAPAROSCOPIC CHOLECYSTECTOMY  2012 approx   LEFT HEART CATH AND CORONARY ANGIOGRAPHY N/A 04/06/2016   Procedure: Left Heart Cath and Coronary Angiography;  Surgeon: Sherren Mocha, MD;  Location: [place] [facility];  Service: Cardiovascular;  Laterality: N/A;   LEFT HEART CATH AND CORONARY ANGIOGRAPHY N/A 07/16/2021   Procedure: LEFT HEART CATH AND CORONARY  ANGIOGRAPHY;  Surgeon: Burnell Blanks, MD;  Location: [place] [facility];  Service: Cardiovascular;  Laterality: N/A;   ORCHIECTOMY Right 1990s   per pt benign cyst   PARTIAL COLECTOMY Right 09/02/2020   Procedure: HEMICOLECTOMY;  Surgeon: Aviva Signs, MD;  Location: [facility];  Service: General;  Laterality: Right;   POLYPECTOMY  08/11/2020   Procedure: POLYPECTOMY;  Surgeon: Eloise Harman, DO;  [facility];  Service: Endoscopy;;   SHOULDER SURGERY Bilateral left 2019;  right 2016   SHOULDER SURGERY Left    SHOULDER SURGERY Right     Current Medications: Current Meds  Medication Sig   acetaminophen (TYLENOL) 500 MG tablet Take 1,000-1,500 mg by mouth daily as needed (headache/pain.).   aspirin EC 81 MG tablet Take 1 tablet (81 mg total) by mouth daily. Swallow whole.   atorvastatin (LIPITOR) 80 MG tablet Take 1 tablet (80 mg total) by mouth daily.   cetirizine (ZYRTEC) 10 MG tablet Take 10 mg by mouth daily.   docusate sodium (COLACE) 100 MG capsule Take 100 mg by mouth at bedtime.   Melatonin 10 MG TABS Take 20 mg by mouth at bedtime.   nitroGLYCERIN (NITROSTAT) 0.4 MG SL tablet Place 1 tablet (0.4 mg total) under the tongue every 5 (five) minutes as needed for chest pain.   ondansetron (ZOFRAN) 4 MG tablet Take 1 tablet (4 mg total) by mouth every 6 (six) hours as needed for nausea or vomiting.   Probiotic Product (PROBIOTIC PO) Take 1 capsule by mouth daily.   ticagrelor (BRILINTA) 90 MG TABS tablet Take 1 tablet (90 mg total) by mouth 2 (two) times daily.    He reports that he is not taking metoprolol  Allergies:   Patient has no known allergies.   Social History   Socioeconomic History   Marital status: Married    Spouse name: Not on file   Number of children: Not on file   Years of education: Not on file   Highest education level: Not on file  Occupational History   Not on file  Tobacco Use   Smoking status: Every Day    Packs/day: 1.50    Years:  35.00    Total pack years: 52.50    Types: Cigarettes   Smokeless tobacco: Never  Vaping Use   Vaping Use: Never used  Substance and Sexual Activity   Alcohol use: Yes    Comment: occasional   Drug use: Never   Sexual activity: Not on file  Other Topics Concern   Not on file  Social History Narrative   Not on file   Social Determinants of Health   Financial Resource Strain: Not on file  Food Insecurity: No Food Insecurity (07/17/2021)   Hunger Vital Sign    Worried About Running Out of Food in the Last Year: Never true    Ran Out of Food in the Last Year: Never true  Transportation Needs: No Transportation Needs (07/17/2021)   PRAPARE - Hydrologist (Medical): No  Lack of Transportation (Non-Medical): No  Physical Activity: Not on file  Stress: Not on file  Social Connections: Not on file     Family History: The patient'sfamily history includes Heart attack in his father, maternal grandfather, and maternal grandmother. There is no history of Colon cancer or Colon polyps. ROS:   Please see the history of present illness.     All other systems reviewed and are negative.  EKGs/Labs/Other Studies Reviewed:    Cath: 07/16/21     Prox Cx to Mid Cx lesion is 25% stenosed.   Prox RCA lesion is 25% stenosed.   Mid RCA lesion is 30% stenosed.   Dist RCA lesion is 30% stenosed.   Mid LAD lesion is 95% stenosed.   A drug-eluting stent was successfully placed using a SYNERGY XD 4.0X24.   Post intervention, there is a 0% residual stenosis.   The left ventricular systolic function is normal.   LV end diastolic pressure is normal.   The left ventricular ejection fraction is 55-65% by visual estimate.   Severe thrombotic stenosis mid LAD Successful PTCA/DES x 1 mid LAD Occlusion of the small apical LAD, likely from embolization of thrombus prior to arrival in the cath lab.  Mild non-obstructive disease in the RCA and Circumflex.  Normal LV systolic  function Diagnostic Dominance: Right  Intervention      Echo: 07/17/21   IMPRESSIONS     1. Left ventricular ejection fraction, by estimation, is 55 to 60%. The  left ventricle has normal function. The left ventricle has no regional  wall motion abnormalities. Left ventricular diastolic parameters were  normal.   2. Right ventricular systolic function is normal. The right ventricular  size is normal. Tricuspid regurgitation signal is inadequate for assessing  PA pressure.   3. The mitral valve is normal in structure. Trivial mitral valve  regurgitation. No evidence of mitral stenosis.   4. The aortic valve was not well visualized. There is mild calcification  of the aortic valve. Aortic valve regurgitation is trivial. No aortic  stenosis is present.   5. The inferior vena cava is dilated in size with <50% respiratory  variability, suggesting right atrial pressure of 15 mmHg.   EKG:  EKG is ordered today.  The ekg ordered today demonstrates Sinus rhythm, incomplete right bundle branch block (QRS 110 ms), QTC 421 ms, normal tracing  Recent Labs: 07/27/2021: BUN 15; Creatinine, Ser 0.59; Hemoglobin 16.7; Platelets 196; Potassium 4.2; Sodium 139   Recent Lipid Panel    Component Value Date/Time   CHOL 78 07/17/2021 1330   CHOL 166 07/26/2019 0908   TRIG 84 07/17/2021 1330   HDL 35 (L) 07/17/2021 1330   HDL 30 (L) 07/26/2019 0908   CHOLHDL 2.2 07/17/2021 1330   VLDL 17 07/17/2021 1330   LDLCALC 26 07/17/2021 1330   LDLCALC 121 (H) 07/26/2019 0908    Physical Exam:    VS:  BP 120/64 (BP Location: Left Arm, Patient Position: Sitting, Cuff Size: Normal)   Pulse 70   Ht '5\' 10"'$  (1.778 m)   Wt 202 lb (91.6 kg)   BMI 28.98 kg/m     Wt Readings from Last 3 Encounters:  11/05/21 202 lb (91.6 kg)  10/17/21 210 lb (95.3 kg)  09/06/21 202 lb 9.6 oz (91.9 kg)     GEN:  Well nourished, well developed in no acute distress.  Moderately overweight HEENT: Normal NECK: No JVD;  No carotid bruits LYMPHATICS: No lymphadenopathy CARDIAC: RRR, no murmurs,  rubs, gallops RESPIRATORY: A few rhonchi and wheezes bilaterally ABDOMEN: Soft, non-tender, non-distended MUSCULOSKELETAL:  No edema; No deformity  SKIN: Warm and dry NEUROLOGIC:  Alert and oriented x 3 PSYCHIATRIC:  Normal affect   ASSESSMENT:    1. Coronary artery disease involving native coronary artery of native heart without angina pectoris   2. Dyslipidemia (high LDL; low HDL)   3. Smoking   4. Overweight     PLAN:    In order of problems listed above:  1. CAD: Anterior STEMI due to 95% stenosis in the mid LAD artery treated with an emergency stent, but without any evidence of lasting myocardial injury, normal LVEF.  Currently asymptomatic.  We will continue dual antiplatelet therapy through June 2024.  He was started on beta-blockers after his MI, but these were stopped because he developed significant bradycardia. 2. HLP: Excellent LDL level, but has a chronically low HDL.  Encourage some weight loss and increase physical activity.  Continue statin at a high dose at least through next June. 3. Smoking cessation: I strongly encourage smoking cessation as the most important intervention.  His wife also needs to quit smoking to allow him to succeed.  It does not sound like he has nicotine addiction is much as a behavioral pattern (he associates smoking cigarettes with the end of a meal).  Discussed ways to break the habit, behavioral lifestyle changes that can assist him to stay quit permanently.  He is currently using nicotine replacement (gum). 4.  Overweight: Limit saturated fat, sweets, starches with high glycemic index.  Increase intake of lean protein and unsaturated fat.  Crease physical activity.    Medication Adjustments/Labs and Tests Ordered: Current medicines are reviewed at length with the patient today.  Concerns regarding medicines are outlined above. Labs and tests ordered and medication  changes are outlined in the patient instructions below:  Patient Instructions  Medication Instructions:  No changes *If you need a refill on your cardiac medications before your next appointment, please call your pharmacy*   Lab Work: None ordered If you have labs (blood work) drawn today and your tests are completely normal, you will receive your results only by: [place] (if you have MyChart) OR A paper copy in the mail If you have any lab test that is abnormal or we need to change your treatment, we will call you to review the results.   Testing/Procedures: None ordered   Follow-Up: At [facility], you and your health needs are our priority.  As part of our continuing mission to provide you with exceptional heart care, we have created designated Provider Care Teams.  These Care Teams include your primary Cardiologist (physician) and Advanced Practice Providers (APPs -  Physician Assistants and Nurse Practitioners) who all work together to provide you with the care you need, when you need it.  We recommend signing up for the patient portal called "MyChart".  Sign up information is provided on this After Visit Summary.  MyChart is used to connect with patients for Virtual Visits (Telemedicine).  Patients are able to view lab/test results, encounter notes, upcoming appointments, etc.  Non-urgent messages can be sent to your provider as well.   To learn more about what you can do with MyChart, go to [email or web address].    Your next appointment:   9 month(s)  The format for your next appointment:   In Person  Provider:   Sanda Klein, MD     Other Instructions Managing the Challenge of Quitting  Smoking Quitting smoking is a physical and mental challenge. You may have cravings, withdrawal symptoms, and temptation to smoke. Before quitting, work with your health care provider to make a plan that can help you manage quitting. Making a plan before you quit may  keep you from smoking when you have the urge to smoke while trying to quit. How to manage lifestyle changes Managing stress Stress can make you want to smoke, and wanting to smoke may cause stress. It is important to find ways to manage your stress. You could try some of the following: Practice relaxation techniques. Breathe slowly and deeply, in through your nose and out through your mouth. Listen to music. Soak in a bath or take a shower. Imagine a peaceful place or vacation. Get some support. Talk with family or friends about your stress. Join a support group. Talk with a counselor or therapist. Get some physical activity. Go for a walk, run, or bike ride. Play a favorite sport. Practice yoga.  Medicines Talk with your health care provider about medicines that might help you deal with cravings and make quitting easier for you. Relationships Social situations can be difficult when you are quitting smoking. To manage this, you can: Avoid parties and other social situations where people might be smoking. Avoid alcohol. Leave right away if you have the urge to smoke. Explain to your family and friends that you are quitting smoking. Ask for support and let them know you might be a bit grumpy. Plan activities where smoking is not an option. General instructions Be aware that many people gain weight after they quit smoking. However, not everyone does. To keep from gaining weight, have a plan in place before you quit, and stick to the plan after you quit. Your plan should include: Eating healthy snacks. When you have a craving, it may help to: Eat popcorn, or try carrots, celery, or other cut vegetables. Chew sugar-free gum. Changing how you eat. Eat small portion sizes at meals. Eat 4-6 small meals throughout the day instead of 1-2 large meals a day. Be mindful when you eat. You should avoid watching television or doing other things that might distract you as you eat. Exercising  regularly. Make time to exercise each day. If you do not have time for a long workout, do short bouts of exercise for 5-10 minutes several times a day. Do some form of strengthening exercise, such as weight lifting. Do some exercise that gets your heart beating and causes you to breathe deeply, such as walking fast, running, swimming, or biking. This is very important. Drinking plenty of water or other low-calorie or no-calorie drinks. Drink enough fluid to keep your urine pale yellow.  How to recognize withdrawal symptoms Your body and mind may experience discomfort as you try to get used to not having nicotine in your system. These effects are called withdrawal symptoms. They may include: Feeling hungrier than normal. Having trouble concentrating. Feeling irritable or restless. Having trouble sleeping. Feeling depressed. Craving a cigarette. These symptoms may surprise you, but they are normal to have when quitting smoking. To manage withdrawal symptoms: Avoid places, people, and activities that trigger your cravings. Remember why you want to quit. Get plenty of sleep. Avoid coffee and other drinks that contain caffeine. These may worsen some of your symptoms. How to manage cravings Come up with a plan for how to deal with your cravings. The plan should include the following: A definition of the specific situation you want to deal  with. An activity or action you will take to replace smoking. A clear idea for how this action will help. The name of someone who could help you with this. Cravings usually last for 5-10 minutes. Consider taking the following actions to help you with your plan to deal with cravings: Keep your mouth busy. Chew sugar-free gum. Suck on hard candies or a straw. Brush your teeth. Keep your hands and body busy. Change to a different activity right away. Squeeze or play with a ball. Do an activity or a hobby, such as making bead jewelry, practicing needlepoint,  or working with wood. Mix up your normal routine. Take a short exercise break. Go for a quick walk, or run up and down stairs. Focus on doing something kind or helpful for someone else. Call a friend or family member to talk during a craving. Join a support group. Contact a quitline. Where to find support To get help or find a support group: Call the [employer]'s Smoking Quitline: 1-800-QUIT-NOW 913-109-5759) Text QUIT to SmokefreeTXT: 341937 Where to find more information Visit these websites to find more information on quitting smoking: U.S. Department of Health and Human Services: www.smokefree.gov American Lung Association: www.freedomfromsmoking.org Centers for Disease Control and Prevention (CDC): http://www.wolf.info/ American Heart Association: www.heart.org Contact a health care provider if: You want to change your plan for quitting. The medicines you are taking are not helping. Your eating feels out of control or you cannot sleep. You feel depressed or become very anxious. Summary Quitting smoking is a physical and mental challenge. You will face cravings, withdrawal symptoms, and temptation to smoke again. Preparation can help you as you go through these challenges. Try different techniques to manage stress, handle social situations, and prevent weight gain. You can deal with cravings by keeping your mouth busy (such as by chewing gum), keeping your hands and body busy, calling family or friends, or contacting a quitline for people who want to quit smoking. You can deal with withdrawal symptoms by avoiding places where people smoke, getting plenty of rest, and avoiding drinks that contain caffeine. This information is not intended to replace advice given to you by your health care provider. Make sure you discuss any questions you have with your health care provider. Document Revised: 01/15/2021 Document Reviewed: 01/15/2021 Elsevier Patient Education  [place]cal[place], Sanda Klein, MD  11/05/2021 8:55 AM    [place]

## 2021-11-05 NOTE — Patient Instructions (Signed)
I appreciate the opportunity to provide care to you today!    Follow up:  4 months  Fasting Labs: please stop by the lab during the week to get your blood drawn (CBC, CMP, TSH, Lipid profile, HgA1c, Vit D)   Referrals today-  dermatology   Please continue to a heart-healthy diet and increase your physical activities. Try to exercise for 43mns at least three times a week.      It was a pleasure to see you and I look forward to continuing to work together on your health and well-being. Please do not hesitate to call the office if you need care or have questions about your care.   Have a wonderful day and week. With Gratitude, GAlvira MondayMSN, FNP-BC

## 2021-11-05 NOTE — Assessment & Plan Note (Signed)
A referral was placed to ENT on 08/04/2021 for bilateral tinnitus He reports having issues with his phone, noting that he probably missed when he was contacted Information provided to the patient to contact ENT and schedule an appointment

## 2021-11-05 NOTE — Patient Instructions (Signed)
Medication Instructions:  No changes *If you need a refill on your cardiac medications before your next appointment, please call your pharmacy*   Lab Work: None ordered If you have labs (blood work) drawn today and your tests are completely normal, you will receive your results only by: [place] (if you have MyChart) OR A paper copy in the mail If you have any lab test that is abnormal or we need to change your treatment, we will call you to review the results.   Testing/Procedures: None ordered   Follow-Up: At [facility], you and your health needs are our priority.  As part of our continuing mission to provide you with exceptional heart care, we have created designated Provider Care Teams.  These Care Teams include your primary Cardiologist (physician) and Advanced Practice Providers (APPs -  Physician Assistants and Nurse Practitioners) who all work together to provide you with the care you need, when you need it.  We recommend signing up for the patient portal called "MyChart".  Sign up information is provided on this After Visit Summary.  MyChart is used to connect with patients for Virtual Visits (Telemedicine).  Patients are able to view lab/test results, encounter notes, upcoming appointments, etc.  Non-urgent messages can be sent to your provider as well.   To learn more about what you can do with MyChart, go to [email or web address].    Your next appointment:   9 month(s)  The format for your next appointment:   In Person  Provider:   Sanda Klein, MD     Other Instructions Managing the Challenge of Quitting Smoking Quitting smoking is a physical and mental challenge. You may have cravings, withdrawal symptoms, and temptation to smoke. Before quitting, work with your health care provider to make a plan that can help you manage quitting. Making a plan before you quit may keep you from smoking when you have the urge to smoke while trying to quit. How to  manage lifestyle changes Managing stress Stress can make you want to smoke, and wanting to smoke may cause stress. It is important to find ways to manage your stress. You could try some of the following: Practice relaxation techniques. Breathe slowly and deeply, in through your nose and out through your mouth. Listen to music. Soak in a bath or take a shower. Imagine a peaceful place or vacation. Get some support. Talk with family or friends about your stress. Join a support group. Talk with a counselor or therapist. Get some physical activity. Go for a walk, run, or bike ride. Play a favorite sport. Practice yoga.  Medicines Talk with your health care provider about medicines that might help you deal with cravings and make quitting easier for you. Relationships Social situations can be difficult when you are quitting smoking. To manage this, you can: Avoid parties and other social situations where people might be smoking. Avoid alcohol. Leave right away if you have the urge to smoke. Explain to your family and friends that you are quitting smoking. Ask for support and let them know you might be a bit grumpy. Plan activities where smoking is not an option. General instructions Be aware that many people gain weight after they quit smoking. However, not everyone does. To keep from gaining weight, have a plan in place before you quit, and stick to the plan after you quit. Your plan should include: Eating healthy snacks. When you have a craving, it may help to: Eat popcorn, or try carrots, celery,  or other cut vegetables. Chew sugar-free gum. Changing how you eat. Eat small portion sizes at meals. Eat 4-6 small meals throughout the day instead of 1-2 large meals a day. Be mindful when you eat. You should avoid watching television or doing other things that might distract you as you eat. Exercising regularly. Make time to exercise each day. If you do not have time for a long workout,  do short bouts of exercise for 5-10 minutes several times a day. Do some form of strengthening exercise, such as weight lifting. Do some exercise that gets your heart beating and causes you to breathe deeply, such as walking fast, running, swimming, or biking. This is very important. Drinking plenty of water or other low-calorie or no-calorie drinks. Drink enough fluid to keep your urine pale yellow.  How to recognize withdrawal symptoms Your body and mind may experience discomfort as you try to get used to not having nicotine in your system. These effects are called withdrawal symptoms. They may include: Feeling hungrier than normal. Having trouble concentrating. Feeling irritable or restless. Having trouble sleeping. Feeling depressed. Craving a cigarette. These symptoms may surprise you, but they are normal to have when quitting smoking. To manage withdrawal symptoms: Avoid places, people, and activities that trigger your cravings. Remember why you want to quit. Get plenty of sleep. Avoid coffee and other drinks that contain caffeine. These may worsen some of your symptoms. How to manage cravings Come up with a plan for how to deal with your cravings. The plan should include the following: A definition of the specific situation you want to deal with. An activity or action you will take to replace smoking. A clear idea for how this action will help. The name of someone who could help you with this. Cravings usually last for 5-10 minutes. Consider taking the following actions to help you with your plan to deal with cravings: Keep your mouth busy. Chew sugar-free gum. Suck on hard candies or a straw. Brush your teeth. Keep your hands and body busy. Change to a different activity right away. Squeeze or play with a ball. Do an activity or a hobby, such as making bead jewelry, practicing needlepoint, or working with wood. Mix up your normal routine. Take a short exercise break. Go for  a quick walk, or run up and down stairs. Focus on doing something kind or helpful for someone else. Call a friend or family member to talk during a craving. Join a support group. Contact a quitline. Where to find support To get help or find a support group: Call the [facility]'s Smoking Quitline: 1-800-QUIT-NOW 780-069-7077) Text QUIT to SmokefreeTXT: 390300 Where to find more information Visit these websites to find more information on quitting smoking: U.S. Department of Health and Human Services: www.smokefree.gov American Lung Association: www.freedomfromsmoking.org Centers for Disease Control and Prevention (CDC): http://www.wolf.info/ American Heart Association: www.heart.org Contact a health care provider if: You want to change your plan for quitting. The medicines you are taking are not helping. Your eating feels out of control or you cannot sleep. You feel depressed or become very anxious. Summary Quitting smoking is a physical and mental challenge. You will face cravings, withdrawal symptoms, and temptation to smoke again. Preparation can help you as you go through these challenges. Try different techniques to manage stress, handle social situations, and prevent weight gain. You can deal with cravings by keeping your mouth busy (such as by chewing gum), keeping your hands and body busy, calling family  or friends, or contacting a quitline for people who want to quit smoking. You can deal with withdrawal symptoms by avoiding places where people smoke, getting plenty of rest, and avoiding drinks that contain caffeine. This information is not intended to replace advice given to you by your health care provider. Make sure you discuss any questions you have with your health care provider. Document Revised: 01/15/2021 Document Reviewed: 01/15/2021 Elsevier Patient Education  [place].

## 2021-11-05 NOTE — Assessment & Plan Note (Signed)
He reports a lesion on his left ear that has been present for three years He denies changes in the lesion size, noting only having pain with his hat on Informed the patient that the lesion is benign, but he would like a referral to dermatology for a second opinion Referral placed to dermatology

## 2021-11-28 ENCOUNTER — Other Ambulatory Visit: Payer: Self-pay | Admitting: Family Medicine

## 2021-11-28 DIAGNOSIS — J301 Allergic rhinitis due to pollen: Secondary | ICD-10-CM

## 2021-11-29 ENCOUNTER — Other Ambulatory Visit: Payer: Self-pay | Admitting: Family Medicine

## 2021-11-29 DIAGNOSIS — J301 Allergic rhinitis due to pollen: Secondary | ICD-10-CM

## 2021-11-29 MED ORDER — AZELASTINE HCL 0.1 % NA SOLN: 2.0000 | Freq: Two times a day (BID) | NASAL | 2 refills | Status: AC

## 2021-11-29 NOTE — Telephone Encounter (Signed)
Refill sent.

## 2022-01-20 ENCOUNTER — Telehealth: Payer: Self-pay | Admitting: Cardiovascular Disease

## 2022-01-20 MED ORDER — ATORVASTATIN CALCIUM 80 MG PO TABS: 80.0000 mg | ORAL_TABLET | Freq: Every day | ORAL | 3 refills | Status: DC

## 2022-01-20 MED ORDER — TICAGRELOR 90 MG PO TABS: 90.0000 mg | ORAL_TABLET | Freq: Two times a day (BID) | ORAL | 3 refills | Status: DC

## 2022-01-20 NOTE — Telephone Encounter (Signed)
*  STAT* If patient is at the pharmacy, call can be transferred to refill team.   1. Which medications need to be refilled? (please list name of each medication and dose if known) ticagrelor (BRILINTA) 90 MG TABS tablet  atorvastatin (LIPITOR) 80 MG tablet   2. Which pharmacy/location (including street and city if local pharmacy) is medication to be sent to?   [facility]  Phone: [phone]   3. Do they need a 30 day or 90 day supply? 90  Pt states he has two more weeks left but he changes his pharmacy and now [facility]

## 2022-03-11 ENCOUNTER — Ambulatory Visit: Payer: Managed Care, Other (non HMO) | Admitting: Family Medicine

## 2022-03-11 ENCOUNTER — Encounter: Payer: Self-pay | Admitting: Family Medicine

## 2022-03-11 VITALS — BP 107/69 | HR 81 | Ht 70.0 in | Wt 217.1 lb

## 2022-03-11 DIAGNOSIS — E785 Hyperlipidemia, unspecified: Secondary | ICD-10-CM | POA: Diagnosis not present

## 2022-03-11 DIAGNOSIS — Z720 Tobacco use: Secondary | ICD-10-CM

## 2022-03-11 DIAGNOSIS — R7301 Impaired fasting glucose: Secondary | ICD-10-CM | POA: Diagnosis not present

## 2022-03-11 DIAGNOSIS — E559 Vitamin D deficiency, unspecified: Secondary | ICD-10-CM

## 2022-03-11 DIAGNOSIS — E7849 Other hyperlipidemia: Secondary | ICD-10-CM

## 2022-03-11 DIAGNOSIS — E038 Other specified hypothyroidism: Secondary | ICD-10-CM

## 2022-03-11 MED ORDER — VARENICLINE TARTRATE 0.5 MG PO TABS: ORAL_TABLET | ORAL | 0 refills | Status: DC

## 2022-03-11 NOTE — Progress Notes (Unsigned)
Established Patient Office Visit  Subjective:  Patient ID: Derrick Turner, male    DOB: March 01, 1970  Age: 52 y.o. MRN: [id number]  CC:  Chief Complaint  Patient presents with   Follow-up    4 month f/u, came this morning to get his labs done. Would like to discuss smoking cessation today.     HPI Derrick Turner is a 52 y.o. male with past medical history of coronary arterial disease, tobacco abuse with GERD presents for f/u of  chronic medical conditions. For the details of today's visit, please refer to the assessment and plan.     Past Medical History:  Diagnosis Date   Coronary artery disease cardiologist--- dr croitoru   ETT 04-01-2016 in epic, reproduced chest pain but no EKG changes;  cardiac cath 04-06-2016 moderate diffuse nonobstructive disease, aggressive medical management   History of 2019 novel coronavirus disease (COVID-19) 03/02/2020   per pt tested positive covid, copy of result in epic, stated mild symptoms that resolved   History of kidney stones    Incomplete right bundle branch block    Right ureteral calculus    Wears glasses     Past Surgical History:  Procedure Laterality Date   APPENDECTOMY  teen   BACK SURGERY     two   BIOPSY  08/11/2020   Procedure: BIOPSY;  Surgeon: Eloise Harman, DO;  [facility];  Service: Endoscopy;;   COLONOSCOPY WITH PROPOFOL N/A 08/11/2020   Procedure: COLONOSCOPY WITH PROPOFOL;  Surgeon: Eloise Harman, DO;  [facility];  Service: Endoscopy;  Laterality: N/A;  7:30am   CORONARY/GRAFT ACUTE MI REVASCULARIZATION N/A 07/16/2021   Procedure: Coronary/Graft Acute MI Revascularization;  Surgeon: Burnell Blanks, MD;  Location: [place] [facility];  Service: Cardiovascular;  Laterality: N/A;   CYSTOSCOPY WITH RETROGRADE PYELOGRAM, URETEROSCOPY AND STENT PLACEMENT Right 04/14/2020   Procedure: CYSTOSCOPY WITH RETROGRADE PYELOGRAM, URETEROSCOPY AND STENT PLACEMENT;  Surgeon: Remi Haggard,  MD;  Location: [facility];  Service: Urology;  Laterality: Right;   EXTRACORPOREAL SHOCK WAVE LITHOTRIPSY Right 03/23/2020   Procedure: EXTRACORPOREAL SHOCK WAVE LITHOTRIPSY (ESWL);  Surgeon: Lucas Mallow, MD;  Location: [facility];  Service: Urology;  Laterality: Right;   HOLMIUM LASER APPLICATION Right 19/37/9024   Procedure: HOLMIUM LASER APPLICATION;  Surgeon: Remi Haggard, MD;  [facility];  Service: Urology;  Laterality: Right;   LAPAROSCOPIC CHOLECYSTECTOMY  2012 approx   LEFT HEART CATH AND CORONARY ANGIOGRAPHY N/A 04/06/2016   Procedure: Left Heart Cath and Coronary Angiography;  Surgeon: Sherren Mocha, MD;  Location: [place] [facility];  Service: Cardiovascular;  Laterality: N/A;   LEFT HEART CATH AND CORONARY ANGIOGRAPHY N/A 07/16/2021   Procedure: LEFT HEART CATH AND CORONARY ANGIOGRAPHY;  Surgeon: Burnell Blanks, MD;  Location: [facility];  Service: Cardiovascular;  Laterality: N/A;   ORCHIECTOMY Right 1990s   per pt benign cyst   PARTIAL COLECTOMY Right 09/02/2020   Procedure: HEMICOLECTOMY;  Surgeon: Aviva Signs, MD;  Location: [facility];  Service: General;  Laterality: Right;   POLYPECTOMY  08/11/2020   Procedure: POLYPECTOMY;  Surgeon: Eloise Harman, DO;  [facility];  Service: Endoscopy;;   SHOULDER SURGERY Bilateral left 2019;  right 2016   SHOULDER SURGERY Left    SHOULDER SURGERY Right     Family History  Problem Relation Age of Onset   Heart attack Father    Heart  attack Maternal Grandmother    Heart attack Maternal Grandfather    Colon cancer Neg Hx    Colon polyps Neg Hx     Social History   Socioeconomic History   Marital status: Married    Spouse name: Not on file   Number of children: Not on file   Years of education: Not on file   Highest education level: Not on file  Occupational History   Not on file  Tobacco Use   Smoking status: Every Day     Packs/day: 1.50    Years: 35.00    Total pack years: 52.50    Types: Cigarettes   Smokeless tobacco: Never  Vaping Use   Vaping Use: Never used  Substance and Sexual Activity   Alcohol use: Yes    Comment: occasional   Drug use: Never   Sexual activity: Not on file  Other Topics Concern   Not on file  Social History Narrative   Not on file   Social Determinants of Health   Financial Resource Strain: Not on file  Food Insecurity: No Food Insecurity (07/17/2021)   Hunger Vital Sign    Worried About Running Out of Food in the Last Year: Never true    Ran Out of Food in the Last Year: Never true  Transportation Needs: No Transportation Needs (07/17/2021)   PRAPARE - Hydrologist (Medical): No    Lack of Transportation (Non-Medical): No  Physical Activity: Not on file  Stress: Not on file  Social Connections: Not on file  Intimate Partner Violence: Not on file    Outpatient Medications Prior to Visit  Medication Sig Dispense Refill   acetaminophen (TYLENOL) 500 MG tablet Take 1,000-1,500 mg by mouth daily as needed (headache/pain.).     aspirin EC 81 MG tablet Take 1 tablet (81 mg total) by mouth daily. Swallow whole. 150 tablet 2   atorvastatin (LIPITOR) 80 MG tablet Take 1 tablet (80 mg total) by mouth daily. 90 tablet 3   azelastine (ASTELIN) 0.1 % nasal spray Place 2 sprays into both nostrils 2 (two) times daily. 1 mL 2   cetirizine (ZYRTEC) 10 MG tablet Take 10 mg by mouth daily.     docusate sodium (COLACE) 100 MG capsule Take 100 mg by mouth at bedtime.     nitroGLYCERIN (NITROSTAT) 0.4 MG SL tablet Place 1 tablet (0.4 mg total) under the tongue every 5 (five) minutes as needed for chest pain. 25 tablet 3   ondansetron (ZOFRAN) 4 MG tablet Take 1 tablet (4 mg total) by mouth every 6 (six) hours as needed for nausea or vomiting. 20 tablet 0   Probiotic Product (PROBIOTIC PO) Take 1 capsule by mouth daily.     ticagrelor (BRILINTA) 90 MG TABS  tablet Take 1 tablet (90 mg total) by mouth 2 (two) times daily. 180 tablet 3   dextromethorphan 15 MG/5ML syrup Take 10 mLs (30 mg total) by mouth 4 (four) times daily as needed for cough. 120 mL 0   Melatonin 10 MG TABS Take 20 mg by mouth at bedtime.     No facility-administered medications prior to visit.    No Known Allergies  ROS Review of Systems  Constitutional:  Negative for fatigue and fever.  Eyes:  Negative for visual disturbance.  Respiratory:  Negative for chest tightness and shortness of breath.   Cardiovascular:  Negative for chest pain and palpitations.  Neurological:  Negative for dizziness and headaches.  Objective:    Physical Exam HENT:     Head: Normocephalic.     Right Ear: External ear normal.     Left Ear: External ear normal.     Nose: No congestion or rhinorrhea.     Mouth/Throat:     Mouth: Mucous membranes are moist.  Cardiovascular:     Rate and Rhythm: Regular rhythm.     Heart sounds: No murmur heard. Pulmonary:     Effort: No respiratory distress.     Breath sounds: Normal breath sounds.  Neurological:     Mental Status: He is alert.     BP 107/69   Pulse 81   Ht '5\' 10"'$  (1.778 m)   Wt 217 lb 1.9 oz (98.5 kg)   SpO2 95%   BMI 31.15 kg/m  Wt Readings from Last 3 Encounters:  03/11/22 217 lb 1.9 oz (98.5 kg)  11/05/21 211 lb 1.9 oz (95.8 kg)  11/05/21 202 lb (91.6 kg)    Lab Results  Component Value Date   TSH 1.220 03/11/2022   Lab Results  Component Value Date   WBC 7.5 03/11/2022   HGB 15.8 03/11/2022   HCT 48.7 03/11/2022   MCV 90 03/11/2022   PLT 166 03/11/2022   Lab Results  Component Value Date   NA 145 (H) 03/11/2022   K 5.2 03/11/2022   CO2 25 03/11/2022   GLUCOSE 107 (H) 03/11/2022   BUN 11 03/11/2022   CREATININE 0.77 03/11/2022   BILITOT 0.5 03/11/2022   ALKPHOS 89 03/11/2022   AST 16 03/11/2022   ALT 16 03/11/2022   PROT 6.8 03/11/2022   ALBUMIN 4.6 03/11/2022   CALCIUM 9.2 03/11/2022    ANIONGAP 14 07/27/2021   EGFR 107 03/11/2022   Lab Results  Component Value Date   CHOL 82 (L) 03/11/2022   Lab Results  Component Value Date   HDL 42 03/11/2022   Lab Results  Component Value Date   LDLCALC 27 03/11/2022   Lab Results  Component Value Date   TRIG 50 03/11/2022   Lab Results  Component Value Date   CHOLHDL 2.0 03/11/2022   Lab Results  Component Value Date   HGBA1C 6.1 (H) 03/11/2022      Assessment & Plan:  Tobacco abuse Assessment & Plan: Reports smoking 1-1/2 pack of cigarettes daily He shares his desire to quit smoking Congratulated patient on his desire to quit smoking We will start patient on Chantix today    Hyperlipidemia LDL goal <70 Assessment & Plan: He takes atorvastatin 80 mg tablet daily He denies chest pain, palpitations, shortness of breath, and chest tightness Encouraged to continue treatment regimen    Tobacco use -     Varenicline Tartrate; Take 1 tablet (0.5 mg total) by mouth daily at 2 PM for 3 days, THEN 1 tablet (0.5 mg total) 2 (two) times daily for 3 days, THEN 2 tablets (1 mg total) 2 (two) times daily.  Dispense: 317 tablet; Refill: 0  IFG (impaired fasting glucose)  Vitamin D deficiency  Other specified hypothyroidism  Other hyperlipidemia    Follow-up: Return in about 3 months (around 06/09/2022).   Alvira Monday, FNP

## 2022-03-11 NOTE — Patient Instructions (Addendum)
I appreciate the opportunity to provide care to you today!    Follow up:  3 months  I appreciate your desire to quit smoking :)  Your prescription for Chantix is sent to your pharmacy  Chantix  helps you quit smoking. It reduces cravings for nicotine, the addictive substance found in tobacco.    Smoking is harmful to your health and increases your risk for cancer, COPD, high blood pressure, cataracts, digestive problems, or health problems , such as gum disease, mouth sores, and tooth loss and loss of taste and smell. Smoking irritates your throat and causes coughing.    Please continue to a heart-healthy diet and increase your physical activities. Try to exercise for 46mns at least five times a week.      It was a pleasure to see you and I look forward to continuing to work together on your health and well-being. Please do not hesitate to call the office if you need care or have questions about your care.   Have a wonderful day and week. With Gratitude, GAlvira MondayMSN, FNP-BC

## 2022-03-12 LAB — TSH+FREE T4
Free T4: 1.19 ng/dL (ref 0.82–1.77)
TSH: 1.22 u[IU]/mL (ref 0.450–4.500)

## 2022-03-12 LAB — CMP14+EGFR
ALT: 16 IU/L (ref 0–44)
AST: 16 IU/L (ref 0–40)
Albumin/Globulin Ratio: 2.1 (ref 1.2–2.2)
Albumin: 4.6 g/dL (ref 3.8–4.9)
Alkaline Phosphatase: 89 IU/L (ref 44–121)
BUN/Creatinine Ratio: 14 (ref 9–20)
BUN: 11 mg/dL (ref 6–24)
Bilirubin Total: 0.5 mg/dL (ref 0.0–1.2)
CO2: 25 mmol/L (ref 20–29)
Calcium: 9.2 mg/dL (ref 8.7–10.2)
Chloride: 106 mmol/L (ref 96–106)
Creatinine, Ser: 0.77 mg/dL (ref 0.76–1.27)
Globulin, Total: 2.2 g/dL (ref 1.5–4.5)
Glucose: 107 mg/dL — ABNORMAL HIGH (ref 70–99)
Potassium: 5.2 mmol/L (ref 3.5–5.2)
Sodium: 145 mmol/L — ABNORMAL HIGH (ref 134–144)
Total Protein: 6.8 g/dL (ref 6.0–8.5)
eGFR: 107 mL/min/{1.73_m2} (ref >59)

## 2022-03-12 LAB — LIPID PANEL
Chol/HDL Ratio: 2 ratio (ref 0.0–5.0)
Cholesterol, Total: 82 mg/dL — ABNORMAL LOW (ref 100–199)
HDL: 42 mg/dL (ref >39)
LDL Chol Calc (NIH): 27 mg/dL (ref 0–99)
Triglycerides: 50 mg/dL (ref 0–149)
VLDL Cholesterol Cal: 13 mg/dL (ref 5–40)

## 2022-03-12 LAB — CBC WITH DIFFERENTIAL/PLATELET
Basophils Absolute: 0 10*3/uL (ref 0.0–0.2)
Basos: 0 %
EOS (ABSOLUTE): 0.2 10*3/uL (ref 0.0–0.4)
Eos: 2 %
Hematocrit: 48.7 % (ref 37.5–51.0)
Hemoglobin: 15.8 g/dL (ref 13.0–17.7)
Immature Grans (Abs): 0 10*3/uL (ref 0.0–0.1)
Immature Granulocytes: 0 %
Lymphocytes Absolute: 1.9 10*3/uL (ref 0.7–3.1)
Lymphs: 25 %
MCH: 29.1 pg (ref 26.6–33.0)
MCHC: 32.4 g/dL (ref 31.5–35.7)
MCV: 90 fL (ref 79–97)
Monocytes Absolute: 0.5 10*3/uL (ref 0.1–0.9)
Monocytes: 7 %
Neutrophils Absolute: 4.9 10*3/uL (ref 1.4–7.0)
Neutrophils: 66 %
Platelets: 166 10*3/uL (ref 150–450)
RBC: 5.43 x10E6/uL (ref 4.14–5.80)
RDW: 13 % (ref 11.6–15.4)
WBC: 7.5 10*3/uL (ref 3.4–10.8)

## 2022-03-12 LAB — VITAMIN D 25 HYDROXY (VIT D DEFICIENCY, FRACTURES): Vit D, 25-Hydroxy: 28.5 ng/mL — ABNORMAL LOW (ref 30.0–100.0)

## 2022-03-12 LAB — HEMOGLOBIN A1C
Est. average glucose Bld gHb Est-mCnc: 128 mg/dL
Hgb A1c MFr Bld: 6.1 % — ABNORMAL HIGH (ref 4.8–5.6)

## 2022-03-12 NOTE — Assessment & Plan Note (Signed)
He takes atorvastatin 80 mg tablet daily He denies chest pain, palpitations, shortness of breath, and chest tightness Encouraged to continue treatment regimen

## 2022-03-12 NOTE — Assessment & Plan Note (Signed)
Reports smoking 1-1/2 pack of cigarettes daily He shares his desire to quit smoking Congratulated patient on his desire to quit smoking We will start patient on Chantix today

## 2022-03-15 NOTE — Progress Notes (Signed)
I recommend increasing your intake of vitamin D rich foods and taking over-the-counter vitamin D at 1000 IU daily. Your hemoglobin A1c has increased from 5.7 to 6.1. you are prediabetic, I recommend decreasing your intake of foods high in sugar.I recommend avoiding simple carbohydrates including cakes, sweet desserts, ice cream, soda (diet or regular), sweet tea, candies, chips, cookies, store-bought juices, alcohol in excess of 1-2 drinks a day, lemonade, artificial sweeteners, donuts, coffee creamers, and sugar-free products.  I recommend avoiding greasy, fatty foods with increased physical activity. Your sodium level is slightly elevated, I recommend decreasing your sodium intake.Your thyroid, kidneys, and liver function are stable.

## 2022-03-18 ENCOUNTER — Encounter: Payer: Self-pay | Admitting: Family Medicine

## 2022-03-18 ENCOUNTER — Ambulatory Visit (INDEPENDENT_AMBULATORY_CARE_PROVIDER_SITE_OTHER): Payer: Managed Care, Other (non HMO) | Admitting: Family Medicine

## 2022-03-18 VITALS — BP 117/68 | HR 83 | Ht 70.0 in | Wt 220.0 lb

## 2022-03-18 DIAGNOSIS — Z122 Encounter for screening for malignant neoplasm of respiratory organs: Secondary | ICD-10-CM

## 2022-03-18 DIAGNOSIS — R109 Unspecified abdominal pain: Secondary | ICD-10-CM | POA: Diagnosis not present

## 2022-03-18 LAB — POCT URINALYSIS DIPSTICK
Bilirubin, UA: NEGATIVE
Glucose, UA: NEGATIVE
Ketones, UA: NEGATIVE
Leukocytes, UA: NEGATIVE
Nitrite, UA: NEGATIVE
Protein, UA: NEGATIVE
Spec Grav, UA: 1.015 (ref 1.010–1.025)
Urobilinogen, UA: 0.2 E.U./dL
pH, UA: 7 (ref 5.0–8.0)

## 2022-03-18 MED ORDER — HYDROCODONE-ACETAMINOPHEN 5-325 MG PO TABS: 1.0000 | ORAL_TABLET | Freq: Four times a day (QID) | ORAL | 0 refills | Status: AC | PRN

## 2022-03-18 NOTE — Progress Notes (Signed)
Patient Office Visit   Subjective   Patient ID: Derrick Turner, male    DOB: Dec 13, 1970  Age: 52 y.o. MRN: [id number]  CC:  Chief Complaint  Patient presents with   Back Pain    Patient complains of R lower back pain starting 2 days ago. Has hx of prostate enlargement and kidney stones.     HPI Derrick Turner presents to the clinic for right flank pain started 2 days ago. He  has a past medical history of Coronary artery disease (cardiologist--- dr Sallyanne Kuster), History of 2019 novel coronavirus disease (COVID-19) (03/02/2020), History of kidney stones, Incomplete right bundle branch block, Right ureteral calculus, and Wears glasses.  Flank Pain This is a new problem. The current episode started in the past 7 days. The problem occurs constantly. The problem has been gradually worsening since onset. The quality of the pain is described as aching and stabbing. Radiates to: lower back. The pain is at a severity of 8/10. The pain is moderate. The pain is The same all the time. The symptoms are aggravated by twisting and bending. Associated symptoms include dysuria. Pertinent negatives include no abdominal pain, fever, headaches, leg pain, numbness or tingling. Risk factors include obesity, sedentary lifestyle and lack of exercise. He has tried heat (Tylenol) for the symptoms. The treatment provided mild relief.     Outpatient Encounter Medications as of 03/18/2022  Medication Sig   aspirin EC 81 MG tablet Take 1 tablet (81 mg total) by mouth daily. Swallow whole.   atorvastatin (LIPITOR) 80 MG tablet Take 1 tablet (80 mg total) by mouth daily.   azelastine (ASTELIN) 0.1 % nasal spray Place 2 sprays into both nostrils 2 (two) times daily.   cetirizine (ZYRTEC) 10 MG tablet Take 10 mg by mouth daily.   docusate sodium (COLACE) 100 MG capsule Take 100 mg by mouth at bedtime.   HYDROcodone-acetaminophen (NORCO) 5-325 MG tablet Take 1 tablet by mouth every 6 (six) hours as needed for up to 5  days for moderate pain.   nitroGLYCERIN (NITROSTAT) 0.4 MG SL tablet Place 1 tablet (0.4 mg total) under the tongue every 5 (five) minutes as needed for chest pain.   ondansetron (ZOFRAN) 4 MG tablet Take 1 tablet (4 mg total) by mouth every 6 (six) hours as needed for nausea or vomiting.   Probiotic Product (PROBIOTIC PO) Take 1 capsule by mouth daily.   ticagrelor (BRILINTA) 90 MG TABS tablet Take 1 tablet (90 mg total) by mouth 2 (two) times daily.   varenicline (CHANTIX) 0.5 MG tablet Take 1 tablet (0.5 mg total) by mouth daily at 2 PM for 3 days, THEN 1 tablet (0.5 mg total) 2 (two) times daily for 3 days, THEN 2 tablets (1 mg total) 2 (two) times daily.   [DISCONTINUED] acetaminophen (TYLENOL) 500 MG tablet Take 1,000-1,500 mg by mouth daily as needed (headache/pain.).   dextromethorphan 15 MG/5ML syrup Take 10 mLs (30 mg total) by mouth 4 (four) times daily as needed for cough.   Melatonin 10 MG TABS Take 20 mg by mouth at bedtime.   No facility-administered encounter medications on file as of 03/18/2022.    Past Surgical History:  Procedure Laterality Date   APPENDECTOMY  teen   BACK SURGERY     two   BIOPSY  08/11/2020   Procedure: BIOPSY;  Surgeon: Eloise Harman, DO;  [facility];  Service: Endoscopy;;   COLONOSCOPY WITH PROPOFOL N/A 08/11/2020   Procedure: COLONOSCOPY WITH  PROPOFOL;  Surgeon: Eloise Harman, DO;  [facility];  Service: Endoscopy;  Laterality: N/A;  7:30am   CORONARY/GRAFT ACUTE MI REVASCULARIZATION N/A 07/16/2021   Procedure: Coronary/Graft Acute MI Revascularization;  Surgeon: Burnell Blanks, MD;  Location: [place] [facility];  Service: Cardiovascular;  Laterality: N/A;   CYSTOSCOPY WITH RETROGRADE PYELOGRAM, URETEROSCOPY AND STENT PLACEMENT Right 04/14/2020   Procedure: CYSTOSCOPY WITH RETROGRADE PYELOGRAM, URETEROSCOPY AND STENT PLACEMENT;  Surgeon: Remi Haggard, MD;  Location: [facility];  Service: Urology;   Laterality: Right;   EXTRACORPOREAL SHOCK WAVE LITHOTRIPSY Right 03/23/2020   Procedure: EXTRACORPOREAL SHOCK WAVE LITHOTRIPSY (ESWL);  Surgeon: Lucas Mallow, MD;  Location: [facility];  Service: Urology;  Laterality: Right;   HOLMIUM LASER APPLICATION Right [id number]   Procedure: HOLMIUM LASER APPLICATION;  Surgeon: Remi Haggard, MD;  Location: [facility];  Service: Urology;  Laterality: Right;   LAPAROSCOPIC CHOLECYSTECTOMY  2012 approx   LEFT HEART CATH AND CORONARY ANGIOGRAPHY N/A 04/06/2016   Procedure: Left Heart Cath and Coronary Angiography;  Surgeon: Sherren Mocha, MD;  Location: [place] [facility];  Service: Cardiovascular;  Laterality: N/A;   LEFT HEART CATH AND CORONARY ANGIOGRAPHY N/A 07/16/2021   Procedure: LEFT HEART CATH AND CORONARY ANGIOGRAPHY;  Surgeon: Burnell Blanks, MD;  Location: [place] [facility];  Service: Cardiovascular;  Laterality: N/A;   ORCHIECTOMY Right 1990s   per pt benign cyst   PARTIAL COLECTOMY Right 09/02/2020   Procedure: HEMICOLECTOMY;  Surgeon: Aviva Signs, MD;  [facility];  Service: General;  Laterality: Right;   POLYPECTOMY  08/11/2020   Procedure: POLYPECTOMY;  Surgeon: Eloise Harman, DO;  [facility];  Service: Endoscopy;;   SHOULDER SURGERY Bilateral left 2019;  right 2016   SHOULDER SURGERY Left    SHOULDER SURGERY Right     Review of Systems  Constitutional:  Negative for chills and fever.  Gastrointestinal:  Negative for abdominal pain.  Genitourinary:  Positive for dysuria, flank pain and hematuria. Negative for frequency and urgency.  Neurological:  Negative for tingling, numbness and headaches.  Psychiatric/Behavioral:  The patient is not nervous/anxious.       Objective    BP 117/68   Pulse 83   Ht 5' 10"$  (1.778 m)   Wt 220 lb (99.8 kg)   SpO2 94%   BMI 31.57 kg/m   Physical Exam Cardiovascular:     Rate and Rhythm: Normal rate.     Pulses: Normal  pulses.  Pulmonary:     Effort: Pulmonary effort is normal. No respiratory distress.     Breath sounds: Normal breath sounds.  Abdominal:     Tenderness: There is right CVA tenderness.  Skin:    General: Skin is warm and dry.     Capillary Refill: Capillary refill takes less than 2 seconds.  Neurological:     Coordination: Coordination normal.     Gait: Gait normal.  Psychiatric:        Mood and Affect: Mood normal.       Assessment & Plan:  Flank pain -     POCT urinalysis dipstick -     CT ABDOMEN PELVIS WO CONTRAST; Future -     HYDROcodone-Acetaminophen; Take 1 tablet by mouth every 6 (six) hours as needed for up to 5 days for moderate pain.  Dispense: 20 tablet; Refill: 0  Screening for lung cancer -     Ambulatory Referral for  Lung Cancer Scre  Right flank pain Assessment & Plan: Labs ordered, Urinalysis pending Right CVA Tenderness Concerning for kidney stone CT Abdomen pelvis w/o contrast ordered placed Prescribed Norco 5-35m for pain management Elaborated the importance on the increasing fluid intake Explained to patient if worsening symptoms occurs visit ED      No follow-ups on file.   IRenard HamperORia Comment FNP

## 2022-03-18 NOTE — Patient Instructions (Addendum)
It was a pleasure meeting with you Please medications as prescribed Please follow up with CT procedure  If symptoms worsen please contact your health care provider and visit the ED

## 2022-03-18 NOTE — Assessment & Plan Note (Signed)
Labs ordered, Urinalysis pending Right CVA Tenderness Concerning for kidney stone CT Abdomen pelvis w/o contrast ordered placed Prescribed Norco 5-338m for pain management Elaborated the importance on the increasing fluid intake Explained to patient if worsening symptoms occurs visit ED

## 2022-03-27 ENCOUNTER — Ambulatory Visit (HOSPITAL_BASED_OUTPATIENT_CLINIC_OR_DEPARTMENT_OTHER)
Admission: RE | Admit: 2022-03-27 | Discharge: 2022-03-27 | Disposition: A | Discharge status: Home / Self Care | Payer: Managed Care, Other (non HMO) | Source: Ambulatory Visit | Attending: Family Medicine | Admitting: Family Medicine

## 2022-03-27 DIAGNOSIS — R109 Unspecified abdominal pain: Secondary | ICD-10-CM | POA: Diagnosis present

## 2022-04-05 ENCOUNTER — Ambulatory Visit: Payer: Managed Care, Other (non HMO) | Admitting: Family Medicine

## 2022-04-05 ENCOUNTER — Encounter: Payer: Self-pay | Admitting: Family Medicine

## 2022-04-05 VITALS — BP 129/74 | HR 80 | Ht 70.0 in | Wt 222.0 lb

## 2022-04-05 DIAGNOSIS — R109 Unspecified abdominal pain: Secondary | ICD-10-CM

## 2022-04-05 DIAGNOSIS — N200 Calculus of kidney: Secondary | ICD-10-CM | POA: Insufficient documentation

## 2022-04-05 MED ORDER — IBUPROFEN 800 MG PO TABS: 800.0000 mg | ORAL_TABLET | Freq: Three times a day (TID) | ORAL | 0 refills | Status: DC | PRN

## 2022-04-05 MED ORDER — KETOROLAC TROMETHAMINE 60 MG/2ML IM SOLN
60.0000 mg | Freq: Once | INTRAMUSCULAR | Status: AC
  Administered 2022-04-05: 60 mg via INTRAMUSCULAR

## 2022-04-05 MED ORDER — OXYCODONE-ACETAMINOPHEN 5-325 MG PO TABS: 1.0000 | ORAL_TABLET | ORAL | 0 refills | Status: DC | PRN

## 2022-04-05 NOTE — Patient Instructions (Addendum)
I appreciate the opportunity to provide care to you today!    Follow up:  3 months  Labs: next visit  Please continue to drain at least 64 ounces of water daily to facilitate the passage of your kidney stones. I recommend taking percocet 5-325 mg for pain control     Please continue to a heart-healthy diet and increase your physical activities. Try to exercise for 31mns at least five times a week.      It was a pleasure to see you and I look forward to continuing to work together on your health and well-being. Please do not hesitate to call the office if you need care or have questions about your care.   Have a wonderful day and week. With Gratitude, GAlvira MondayMSN, FNP-BC

## 2022-04-05 NOTE — Progress Notes (Signed)
Established Patient Office Visit  Subjective:  Patient ID: Derrick Turner, male    DOB: 1971-01-05  Age: 52 y.o. MRN: [id number]  CC:  Chief Complaint  Patient presents with   Flank Pain    Pt reports right lower quadrant pain has been present since 03/11/22. Had a CT done, saw results on my chart, sates he has been in so much pain causing pain to radiate to his low back and pelvic area.     HPI Derrick Turner is a 52 y.o. male with past medical history of GERD, hyperlipidemia, and atypical chest pain presents for f/u of  chronic medical conditions. For the details of today's visit, please refer to the assessment and plan.    Past Medical History:  Diagnosis Date   Coronary artery disease cardiologist--- dr croitoru   ETT 04-01-2016 in epic, reproduced chest pain but no EKG changes;  cardiac cath 04-06-2016 moderate diffuse nonobstructive disease, aggressive medical management   History of 2019 novel coronavirus disease (COVID-19) 03/02/2020   per pt tested positive covid, copy of result in epic, stated mild symptoms that resolved   History of kidney stones    Incomplete right bundle branch block    Right ureteral calculus    Wears glasses     Past Surgical History:  Procedure Laterality Date   APPENDECTOMY  teen   BACK SURGERY     two   BIOPSY  08/11/2020   Procedure: BIOPSY;  Surgeon: Eloise Harman, DO;  [facility];  Service: Endoscopy;;   COLONOSCOPY WITH PROPOFOL N/A 08/11/2020   Procedure: COLONOSCOPY WITH PROPOFOL;  Surgeon: Eloise Harman, DO;  [facility];  Service: Endoscopy;  Laterality: N/A;  7:30am   CORONARY/GRAFT ACUTE MI REVASCULARIZATION N/A 07/16/2021   Procedure: Coronary/Graft Acute MI Revascularization;  Surgeon: Burnell Blanks, MD;  Location: [place] [facility];  Service: Cardiovascular;  Laterality: N/A;   CYSTOSCOPY WITH RETROGRADE PYELOGRAM, URETEROSCOPY AND STENT PLACEMENT Right 04/14/2020   Procedure:  CYSTOSCOPY WITH RETROGRADE PYELOGRAM, URETEROSCOPY AND STENT PLACEMENT;  Surgeon: Remi Haggard, MD;  Location: [facility];  Service: Urology;  Laterality: Right;   EXTRACORPOREAL SHOCK WAVE LITHOTRIPSY Right 03/23/2020   Procedure: EXTRACORPOREAL SHOCK WAVE LITHOTRIPSY (ESWL);  Surgeon: Lucas Mallow, MD;  Location: [facility];  Service: Urology;  Laterality: Right;   HOLMIUM LASER APPLICATION Right [id number]   Procedure: HOLMIUM LASER APPLICATION;  Surgeon: Remi Haggard, MD;  Location: [facility];  Service: Urology;  Laterality: Right;   LAPAROSCOPIC CHOLECYSTECTOMY  2012 approx   LEFT HEART CATH AND CORONARY ANGIOGRAPHY N/A 04/06/2016   Procedure: Left Heart Cath and Coronary Angiography;  Surgeon: Sherren Mocha, MD;  Location: [place] [facility];  Service: Cardiovascular;  Laterality: N/A;   LEFT HEART CATH AND CORONARY ANGIOGRAPHY N/A 07/16/2021   Procedure: LEFT HEART CATH AND CORONARY ANGIOGRAPHY;  Surgeon: Burnell Blanks, MD;  Location: [place] [facility];  Service: Cardiovascular;  Laterality: N/A;   ORCHIECTOMY Right 1990s   per pt benign cyst   PARTIAL COLECTOMY Right 09/02/2020   Procedure: HEMICOLECTOMY;  Surgeon: Aviva Signs, MD;  [facility];  Service: General;  Laterality: Right;   POLYPECTOMY  08/11/2020   Procedure: POLYPECTOMY;  Surgeon: Eloise Harman, DO;  [facility];  Service: Endoscopy;;   SHOULDER SURGERY Bilateral left 2019;  right 2016   SHOULDER SURGERY Left    SHOULDER SURGERY Right  Family History  Problem Relation Age of Onset   Heart attack Father    Heart attack Maternal Grandmother    Heart attack Maternal Grandfather    Colon cancer Neg Hx    Colon polyps Neg Hx     Social History   Socioeconomic History   Marital status: Married    Spouse name: Not on file   Number of children: Not on file   Years of education: Not on file   Highest education level: Not  on file  Occupational History   Not on file  Tobacco Use   Smoking status: Every Day    Packs/day: 1.50    Years: 35.00    Total pack years: 52.50    Types: Cigarettes   Smokeless tobacco: Never  Vaping Use   Vaping Use: Never used  Substance and Sexual Activity   Alcohol use: Yes    Comment: occasional   Drug use: Never   Sexual activity: Not on file  Other Topics Concern   Not on file  Social History Narrative   Not on file   Social Determinants of Health   Financial Resource Strain: Not on file  Food Insecurity: No Food Insecurity (07/17/2021)   Hunger Vital Sign    Worried About Running Out of Food in the Last Year: Never true    Ran Out of Food in the Last Year: Never true  Transportation Needs: No Transportation Needs (07/17/2021)   PRAPARE - Hydrologist (Medical): No    Lack of Transportation (Non-Medical): No  Physical Activity: Not on file  Stress: Not on file  Social Connections: Not on file  Intimate Partner Violence: Not on file    Outpatient Medications Prior to Visit  Medication Sig Dispense Refill   aspirin EC 81 MG tablet Take 1 tablet (81 mg total) by mouth daily. Swallow whole. 150 tablet 2   atorvastatin (LIPITOR) 80 MG tablet Take 1 tablet (80 mg total) by mouth daily. 90 tablet 3   azelastine (ASTELIN) 0.1 % nasal spray Place 2 sprays into both nostrils 2 (two) times daily. 1 mL 2   cetirizine (ZYRTEC) 10 MG tablet Take 10 mg by mouth daily.     docusate sodium (COLACE) 100 MG capsule Take 100 mg by mouth at bedtime.     nitroGLYCERIN (NITROSTAT) 0.4 MG SL tablet Place 1 tablet (0.4 mg total) under the tongue every 5 (five) minutes as needed for chest pain. 25 tablet 3   ondansetron (ZOFRAN) 4 MG tablet Take 1 tablet (4 mg total) by mouth every 6 (six) hours as needed for nausea or vomiting. 20 tablet 0   Probiotic Product (PROBIOTIC PO) Take 1 capsule by mouth daily.     ticagrelor (BRILINTA) 90 MG TABS tablet Take 1  tablet (90 mg total) by mouth 2 (two) times daily. 180 tablet 3   varenicline (CHANTIX) 0.5 MG tablet Take 1 tablet (0.5 mg total) by mouth daily at 2 PM for 3 days, THEN 1 tablet (0.5 mg total) 2 (two) times daily for 3 days, THEN 2 tablets (1 mg total) 2 (two) times daily. 317 tablet 0   dextromethorphan 15 MG/5ML syrup Take 10 mLs (30 mg total) by mouth 4 (four) times daily as needed for cough. 120 mL 0   Melatonin 10 MG TABS Take 20 mg by mouth at bedtime.     No facility-administered medications prior to visit.    No Known Allergies  ROS Review of  Systems  Constitutional:  Negative for fatigue and fever.  Eyes:  Negative for visual disturbance.  Respiratory:  Negative for chest tightness and shortness of breath.   Cardiovascular:  Negative for chest pain and palpitations.  Genitourinary:  Positive for flank pain.  Neurological:  Negative for dizziness and headaches.      Objective:    Physical Exam Constitutional:      General: He is in acute distress.  HENT:     Head: Normocephalic.     Right Ear: External ear normal.     Left Ear: External ear normal.     Nose: No congestion or rhinorrhea.     Mouth/Throat:     Mouth: Mucous membranes are moist.  Cardiovascular:     Rate and Rhythm: Regular rhythm.     Heart sounds: No murmur heard. Pulmonary:     Effort: No respiratory distress.     Breath sounds: Normal breath sounds.  Abdominal:     Tenderness: There is abdominal tenderness in the right lower quadrant.  Neurological:     Mental Status: He is alert.     BP 129/74   Pulse 80   Ht 5' 10"$  (1.778 m)   Wt 222 lb (100.7 kg)   SpO2 93%   BMI 31.85 kg/m  Wt Readings from Last 3 Encounters:  04/05/22 222 lb (100.7 kg)  03/18/22 220 lb (99.8 kg)  03/11/22 217 lb 1.9 oz (98.5 kg)    Lab Results  Component Value Date   TSH 1.220 03/11/2022   Lab Results  Component Value Date   WBC 7.5 03/11/2022   HGB 15.8 03/11/2022   HCT 48.7 03/11/2022   MCV 90  03/11/2022   PLT 166 03/11/2022   Lab Results  Component Value Date   NA 145 (H) 03/11/2022   K 5.2 03/11/2022   CO2 25 03/11/2022   GLUCOSE 107 (H) 03/11/2022   BUN 11 03/11/2022   CREATININE 0.77 03/11/2022   BILITOT 0.5 03/11/2022   ALKPHOS 89 03/11/2022   AST 16 03/11/2022   ALT 16 03/11/2022   PROT 6.8 03/11/2022   ALBUMIN 4.6 03/11/2022   CALCIUM 9.2 03/11/2022   ANIONGAP 14 07/27/2021   EGFR 107 03/11/2022   Lab Results  Component Value Date   CHOL 82 (L) 03/11/2022   Lab Results  Component Value Date   HDL 42 03/11/2022   Lab Results  Component Value Date   LDLCALC 27 03/11/2022   Lab Results  Component Value Date   TRIG 50 03/11/2022   Lab Results  Component Value Date   CHOLHDL 2.0 03/11/2022   Lab Results  Component Value Date   HGBA1C 6.1 (H) 03/11/2022      Assessment & Plan:  Right flank pain Assessment & Plan: Complains of severe right flank pain CT scan of the abdomen on 03/27/2022 shows multiple nonobstructing bilateral  renal calculi measure up to 3 mm Pain is rated 10 out of 10 Reports that he has been taking his Percocet 5-325 with minimal relief of his symptoms He reports increasing his fluid intake Will provide one-time Toradol injection in the clinic due to patient's severe pain Will reinstate Percocet 06/10/2023 to take at home    Orders: -     Ketorolac Tromethamine -     oxyCODONE-Acetaminophen; Take 1 tablet by mouth every 4 (four) hours as needed for severe pain.  Dispense: 10 tablet; Refill: 0    Follow-up: Return in about 3 months (around 07/04/2022).  Alvira Monday, FNP

## 2022-04-05 NOTE — Assessment & Plan Note (Addendum)
Complains of severe right flank pain CT scan of the abdomen on 03/27/2022 shows multiple nonobstructing bilateral  renal calculi measure up to 3 mm Pain is rated 10 out of 10 Reports that he has been taking his Percocet 5-325 with minimal relief of his symptoms He reports increasing his fluid intake Will provide one-time Toradol injection in the clinic due to patient's severe pain Will reinstate Percocet 06/10/2023 to take at home

## 2022-05-21 ENCOUNTER — Other Ambulatory Visit: Payer: Self-pay | Admitting: Family Medicine

## 2022-05-21 DIAGNOSIS — Z720 Tobacco use: Secondary | ICD-10-CM

## 2022-06-01 ENCOUNTER — Other Ambulatory Visit: Payer: Self-pay | Admitting: Family Medicine

## 2022-06-01 DIAGNOSIS — Z720 Tobacco use: Secondary | ICD-10-CM

## 2022-06-01 MED ORDER — VARENICLINE TARTRATE 0.5 MG PO TABS: ORAL_TABLET | ORAL | 0 refills | Status: AC

## 2022-06-08 ENCOUNTER — Telehealth: Payer: Self-pay

## 2022-06-08 NOTE — Transitions of Care (Post Inpatient/ED Visit) (Signed)
   06/08/2022  Name: Derrick Turner MRN: [id number] DOB: [birth year]  Today's TOC FU Call Status:    Attempted to reach the patient regarding the most recent Inpatient/ED visit.  Follow Up Plan: Additional outreach attempts will be made to reach the patient to complete the Transitions of Care (Post Inpatient/ED visit) call.   Signature  Fredirick Maudlin [facility]

## 2022-06-09 ENCOUNTER — Telehealth: Payer: Self-pay

## 2022-06-09 NOTE — Transitions of Care (Post Inpatient/ED Visit) (Signed)
06/09/2022  Name: Derrick Turner MRN: [id number] DOB: May 14, 1970  Today's TOC FU Call Status: Today's TOC FU Call Status:: Successful TOC FU Call Competed Unsuccessful Call (1st Attempt) Date: 06/08/22 [facility] FU Call Complete Date: 06/09/22  Transition Care Management Follow-up Telephone Call Date of Discharge: 06/07/22 Discharge Facility: Other Recruitment consultant Facility) [facility]) Name of Other (Non-[facility]) Discharge Facility: Chest pain Type of Discharge: Emergency Department Reason for ED Visit: Cardiac Conditions Cardiac Conditions Diagnosis: Chest Pain Persisting How have you been since you were released from the hospital?: Same Any questions or concerns?: No  Items Reviewed: Did you receive and understand the discharge instructions provided?: Yes Medications obtained,verified, and reconciled?: Yes (Medications Reviewed) Any new allergies since your discharge?: No Dietary orders reviewed?: No Do you have support at home?: Yes People in Home: spouse  Medications Reviewed Today: Medications Reviewed Today     Reviewed by Herbie Saxon, CMA (Certified Medical Assistant) on 04/05/22 at [phone]  Med List Status: <None>   Medication Order Taking? Sig Documenting Provider Last Dose Status Informant  aspirin EC 81 MG tablet [id number] Yes Take 1 tablet (81 mg total) by mouth daily. Swallow whole. Marcelino Duster, PA Taking Active Self  atorvastatin (LIPITOR) 80 MG tablet [id number] Yes Take 1 tablet (80 mg total) by mouth daily. Croitoru, Mihai, MD Taking Active   azelastine (ASTELIN) 0.1 % nasal spray [id number] Yes Place 2 sprays into both nostrils 2 (two) times daily. Gilmore Laroche, FNP Taking Active   cetirizine (ZYRTEC) 10 MG tablet [id number] Yes Take 10 mg by mouth daily. [provider] Taking Active Self  dextromethorphan 15 MG/5ML syrup [id number]  Take 10 mLs (30 mg total) by mouth 4 (four) times daily as needed for cough. Gilmore Laroche, FNP  Active   docusate  sodium (COLACE) 100 MG capsule [id number] Yes Take 100 mg by mouth at bedtime. [provider] Taking Active Self  Melatonin 10 MG TABS [id number]  Take 20 mg by mouth at bedtime. [provider]  Active Self  nitroGLYCERIN (NITROSTAT) 0.4 MG SL tablet [id number] Yes Place 1 tablet (0.4 mg total) under the tongue every 5 (five) minutes as needed for chest pain. Marcelino Duster, PA Taking Active Self  ondansetron (ZOFRAN) 4 MG tablet [id number] Yes Take 1 tablet (4 mg total) by mouth every 6 (six) hours as needed for nausea or vomiting. Tiffany Kocher, PA-C Taking Active Self  Probiotic Product (PROBIOTIC PO) [id number] Yes Take 1 capsule by mouth daily. [provider] Taking Active Self  ticagrelor (BRILINTA) 90 MG TABS tablet [id number] Yes Take 1 tablet (90 mg total) by mouth 2 (two) times daily. Croitoru, Mihai, MD Taking Active   varenicline (CHANTIX) 0.5 MG tablet [id number] Yes Take 1 tablet (0.5 mg total) by mouth daily at 2 PM for 3 days, THEN 1 tablet (0.5 mg total) 2 (two) times daily for 3 days, THEN 2 tablets (1 mg total) 2 (two) times daily. Gilmore Laroche, FNP Taking Active             Home Care and Equipment/Supplies: Were Home Health Services Ordered?: No Any new equipment or medical supplies ordered?: No  Functional Questionnaire: Do you need assistance with bathing/showering or dressing?: No Do you need assistance with meal preparation?: No Do you need assistance with eating?: No Do you have difficulty maintaining continence: No Do you need assistance with getting out of bed/getting out of a chair/moving?: No Do you have difficulty managing or taking your medications?: No  Follow up appointments reviewed: PCP Follow-up appointment confirmed?: Yes Date of PCP follow-up appointment?: 06/10/22 Follow-up Provider: [facility] Follow-up appointment confirmed?: NA Do you need transportation to your follow-up appointment?: No Do  you understand care options if your condition(s) worsen?: Yes-patient verbalized understanding    SIGNATURE Jaquavis Felmlee,CMA [facility]

## 2022-06-10 ENCOUNTER — Ambulatory Visit: Payer: Managed Care, Other (non HMO) | Admitting: Family Medicine

## 2022-06-10 VITALS — BP 140/88 | HR 78 | Ht 70.0 in | Wt 217.0 lb

## 2022-06-10 DIAGNOSIS — F419 Anxiety disorder, unspecified: Secondary | ICD-10-CM

## 2022-06-10 DIAGNOSIS — M791 Myalgia, unspecified site: Secondary | ICD-10-CM | POA: Diagnosis not present

## 2022-06-10 MED ORDER — CITALOPRAM HYDROBROMIDE 20 MG PO TABS: 20.0000 mg | ORAL_TABLET | Freq: Every day | ORAL | 3 refills | Status: DC

## 2022-06-10 MED ORDER — CYCLOBENZAPRINE HCL 5 MG PO TABS: 5.0000 mg | ORAL_TABLET | Freq: Three times a day (TID) | ORAL | 1 refills | Status: AC | PRN

## 2022-06-10 MED ORDER — HYDROXYZINE PAMOATE 25 MG PO CAPS: 25.0000 mg | ORAL_CAPSULE | Freq: Three times a day (TID) | ORAL | 0 refills | Status: DC | PRN

## 2022-06-10 NOTE — Patient Instructions (Signed)
        Great to see you today.   - Please take medications as prescribed. - Follow up with your primary health provider if any health concerns arises. - If symptoms worsen please contact your primary care provider and/or visit the emergency department.  

## 2022-06-10 NOTE — Assessment & Plan Note (Addendum)
    06/10/2022    8:47 AM 04/05/2022    9:24 AM 03/18/2022    2:00 PM 03/11/2022    1:08 PM  GAD 7 : Generalized Anxiety Score  Nervous, Anxious, on Edge 0 1 0 0  Control/stop worrying 3 0 0 0  Worry too much - different things 3 0 0 0  Trouble relaxing 1 2 1  0  Restless 3 1 0 0  Easily annoyed or irritable 3 1 1  0  Afraid - awful might happen 0 0 0 0  Total GAD 7 Score 13 5 2  0  Anxiety Difficulty Not difficult at all Somewhat difficult Not difficult at all Not difficult at all  Anxiety related chest pain. Started Celexa 20 mg daily and Hydroxyzine 25 mg PRN Discussed about cognitive behavioral therapy focusing on thoughts, belief, and attitudes that affects feelings and behavior, learning about coping skills to deal with certain problems. Maintaining a consistent routine and schedule, Practice stress management and self calming techniques, excersise regularly and spend time outdoors, Do not eat food that are high in fat, added sugar, or salt. Follow up in 6 weeks Referral to behavior health    Patient verbally consented to [facility] services about presenting concerns and psychiatric consultation as appropriate. The services will be billed as appropriate for the patient.

## 2022-06-10 NOTE — Progress Notes (Signed)
Patient Office Visit   Subjective   Patient ID: Derrick Turner, male    DOB: 04-21-1970  Age: 52 y.o. MRN: [id number]  CC:  Chief Complaint  Patient presents with   Anxiety    Patient states he was at [facility] ED on 4/30 with chest pain. They did EKG and ruled out heart issues. Complains of elevated stress and anxiety recently.     HPI Derrick Turner 52 year old male, presents to the clinic for anxiety.  He  has a past medical history of Coronary artery disease (cardiologist--- dr Royann Shivers), History of 2019 novel coronavirus disease (COVID-19) (03/02/2020), History of kidney stones, Incomplete right bundle branch block, Right ureteral calculus, and Wears glasses.  Anxiety Presents for initial visit. Symptoms include chest pain, decreased concentration, excessive worry, irritability, muscle tension, nervous/anxious behavior and restlessness. Patient reports no dizziness, insomnia, palpitations, shortness of breath or suicidal ideas. Symptoms occur constantly. The severity of symptoms is causing significant distress. The symptoms are aggravated by family issues. The patient sleeps 6 hours per night. The quality of sleep is fair. Nighttime awakenings: none. Risk factors include family history, marital problems and alcohol intake. His past medical history is significant for anxiety/panic attacks. There is no history of depression or suicide attempts. Past treatments include lifestyle changes.        Outpatient Encounter Medications as of 06/10/2022  Medication Sig   aspirin EC 81 MG tablet Take 1 tablet (81 mg total) by mouth daily. Swallow whole.   atorvastatin (LIPITOR) 80 MG tablet Take 1 tablet (80 mg total) by mouth daily.   azelastine (ASTELIN) 0.1 % nasal spray Place 2 sprays into both nostrils 2 (two) times daily.   cetirizine (ZYRTEC) 10 MG tablet Take 10 mg by mouth daily.   citalopram (CELEXA) 20 MG tablet Take 1 tablet (20 mg total) by mouth daily.   cyclobenzaprine  (FLEXERIL) 5 MG tablet Take 1 tablet (5 mg total) by mouth 3 (three) times daily as needed for muscle spasms.   docusate sodium (COLACE) 100 MG capsule Take 100 mg by mouth at bedtime.   hydrOXYzine (VISTARIL) 25 MG capsule Take 1 capsule (25 mg total) by mouth every 8 (eight) hours as needed.   nitroGLYCERIN (NITROSTAT) 0.4 MG SL tablet Place 1 tablet (0.4 mg total) under the tongue every 5 (five) minutes as needed for chest pain.   Probiotic Product (PROBIOTIC PO) Take 1 capsule by mouth daily.   ticagrelor (BRILINTA) 90 MG TABS tablet Take 1 tablet (90 mg total) by mouth 2 (two) times daily.   varenicline (CHANTIX) 0.5 MG tablet Take 1 tablet (0.5 mg total) by mouth daily at 2 PM for 3 days, THEN 1 tablet (0.5 mg total) 2 (two) times daily for 3 days, THEN 2 tablets (1 mg total) 2 (two) times daily.   dextromethorphan 15 MG/5ML syrup Take 10 mLs (30 mg total) by mouth 4 (four) times daily as needed for cough.   Melatonin 10 MG TABS Take 20 mg by mouth at bedtime.   ondansetron (ZOFRAN) 4 MG tablet Take 1 tablet (4 mg total) by mouth every 6 (six) hours as needed for nausea or vomiting.   oxyCODONE-acetaminophen (PERCOCET/ROXICET) 5-325 MG tablet Take 1 tablet by mouth every 4 (four) hours as needed for severe pain.   No facility-administered encounter medications on file as of 06/10/2022.    Past Surgical History:  Procedure Laterality Date   APPENDECTOMY  teen   BACK SURGERY  two   BIOPSY  08/11/2020   Procedure: BIOPSY;  Surgeon: Lanelle Bal, DO;  Location: [facility];  Service: Endoscopy;;   COLONOSCOPY WITH PROPOFOL N/A 08/11/2020   Procedure: COLONOSCOPY WITH PROPOFOL;  Surgeon: Lanelle Bal, DO;  Location: [facility];  Service: Endoscopy;  Laterality: N/A;  7:30am   CORONARY/GRAFT ACUTE MI REVASCULARIZATION N/A 07/16/2021   Procedure: Coronary/Graft Acute MI Revascularization;  Surgeon: Kathleene Hazel, MD;  Location: [facility];  Service: Cardiovascular;   Laterality: N/A;   CYSTOSCOPY WITH RETROGRADE PYELOGRAM, URETEROSCOPY AND STENT PLACEMENT Right 04/14/2020   Procedure: CYSTOSCOPY WITH RETROGRADE PYELOGRAM, URETEROSCOPY AND STENT PLACEMENT;  Surgeon: Belva Agee, MD;  Location: [facility];  Service: Urology;  Laterality: Right;   EXTRACORPOREAL SHOCK WAVE LITHOTRIPSY Right 03/23/2020   Procedure: EXTRACORPOREAL SHOCK WAVE LITHOTRIPSY (ESWL);  Surgeon: Crista Elliot, MD;  Location: [facility];  Service: Urology;  Laterality: Right;   HOLMIUM LASER APPLICATION Right 04/14/2020   Procedure: HOLMIUM LASER APPLICATION;  Surgeon: Belva Agee, MD;  Location: [facility];  Service: Urology;  Laterality: Right;   LAPAROSCOPIC CHOLECYSTECTOMY  2012 approx   LEFT HEART CATH AND CORONARY ANGIOGRAPHY N/A 04/06/2016   Procedure: Left Heart Cath and Coronary Angiography;  Surgeon: Tonny Bollman, MD;  Location: [facility]VASIVE CV LAB;  Service: Cardiovascular;  Laterality: N/A;   LEFT HEART CATH AND CORONARY ANGIOGRAPHY N/A 07/16/2021   Procedure: LEFT HEART CATH AND CORONARY ANGIOGRAPHY;  Surgeon: Kathleene Hazel, MD;  Location: [facility];  Service: Cardiovascular;  Laterality: N/A;   ORCHIECTOMY Right 1990s   per pt benign cyst   PARTIAL COLECTOMY Right 09/02/2020   Procedure: HEMICOLECTOMY;  Surgeon: Franky Macho, MD;  Location: [facility];  Service: General;  Laterality: Right;   POLYPECTOMY  08/11/2020   Procedure: POLYPECTOMY;  Surgeon: Lanelle Bal, DO;  Location: [facility];  Service: Endoscopy;;   SHOULDER SURGERY Bilateral left 2019;  right 2016   SHOULDER SURGERY Left    SHOULDER SURGERY Right     Review of Systems  Constitutional:  Negative for chills, fever and malaise/fatigue.  Eyes:  Negative for blurred vision.  Respiratory:  Negative for shortness of breath.   Cardiovascular:  Positive for chest pain.  Gastrointestinal:  Negative for abdominal pain.   Genitourinary:  Negative for dysuria.  Musculoskeletal:  Negative for myalgias.  Neurological:  Negative for dizziness and headaches.      Objective    BP (!) 140/88   Pulse 78   Ht 5\' 10"  (1.778 m)   Wt 217 lb (98.4 kg)   BMI 31.14 kg/m   Physical Exam Vitals reviewed.  Constitutional:      General: He is not in acute distress.    Appearance: Normal appearance. He is not ill-appearing, toxic-appearing or diaphoretic.  HENT:     Head: Normocephalic.  Eyes:     General:        Right eye: No discharge.        Left eye: No discharge.     Conjunctiva/sclera: Conjunctivae normal.  Cardiovascular:     Pulses: Normal pulses.     Heart sounds: Normal heart sounds.  Pulmonary:     Effort: Pulmonary effort is normal. No respiratory distress.     Breath sounds: Normal breath sounds.  Musculoskeletal:        General: Normal range of motion.     Cervical back: Normal range of  motion.  Skin:    General: Skin is warm and dry.     Capillary Refill: Capillary refill takes less than 2 seconds.  Neurological:     General: No focal deficit present.     Mental Status: He is alert and oriented to person, place, and time.     Coordination: Coordination normal.     Gait: Gait normal.  Psychiatric:        Mood and Affect: Mood normal.        Behavior: Behavior normal.       Assessment & Plan:  Anxiety Assessment & Plan:    06/10/2022    8:47 AM 04/05/2022    9:24 AM 03/18/2022    2:00 PM 03/11/2022    1:08 PM  GAD 7 : Generalized Anxiety Score  Nervous, Anxious, on Edge 0 1 0 0  Control/stop worrying 3 0 0 0  Worry too much - different things 3 0 0 0  Trouble relaxing 1 2 1  0  Restless 3 1 0 0  Easily annoyed or irritable 3 1 1  0  Afraid - awful might happen 0 0 0 0  Total GAD 7 Score 13 5 2  0  Anxiety Difficulty Not difficult at all Somewhat difficult Not difficult at all Not difficult at all  Anxiety related chest pain. Started Celexa 20 mg daily and Hydroxyzine 25 mg  PRN Discussed about cognitive behavioral therapy focusing on thoughts, belief, and attitudes that affects feelings and behavior, learning about coping skills to deal with certain problems. Maintaining a consistent routine and schedule, Practice stress management and self calming techniques, excersise regularly and spend time outdoors, Do not eat food that are high in fat, added sugar, or salt. Follow up in 6 weeks Referral to behavior health    Patient verbally consented to [facility] services about presenting concerns and psychiatric consultation as appropriate. The services will be billed as appropriate for the patient.    Orders: -     Citalopram Hydrobromide; Take 1 tablet (20 mg total) by mouth daily.  Dispense: 30 tablet; Refill: 3 -     hydrOXYzine Pamoate; Take 1 capsule (25 mg total) by mouth every 8 (eight) hours as needed.  Dispense: 30 capsule; Refill: 0 -     Ambulatory referral to Behavioral Health  Generalized muscle ache Assessment & Plan: Flexeril 5 mg PRN Explained to patient Non pharmacological interventions include the use of ice or heat, rest, recommend range of motion exercises, gentle stretching. Follow up for worsening or persistent symptoms. Patient verbalizes understanding regarding plan of care and all questions answered.   Orders: -     Cyclobenzaprine HCl; Take 1 tablet (5 mg total) by mouth 3 (three) times daily as needed for muscle spasms.  Dispense: 30 tablet; Refill: 1    Return in about 6 weeks (around 07/22/2022), or if symptoms worsen or fail to improve, for Anxiety.   Cruzita Lederer Newman Nip, FNP

## 2022-06-10 NOTE — Assessment & Plan Note (Signed)
Flexeril 5 mg PRN Explained to patient Non pharmacological interventions include the use of ice or heat, rest, recommend range of motion exercises, gentle stretching. Follow up for worsening or persistent symptoms. Patient verbalizes understanding regarding plan of care and all questions answered.

## 2022-06-17 ENCOUNTER — Ambulatory Visit (INDEPENDENT_AMBULATORY_CARE_PROVIDER_SITE_OTHER): Payer: Self-pay | Admitting: Professional Counselor

## 2022-06-17 DIAGNOSIS — F411 Generalized anxiety disorder: Secondary | ICD-10-CM

## 2022-06-17 NOTE — BH Specialist Note (Addendum)
Collaborative Care Initial Assessment   Summary  Patient is a 52 y/o male being referred to collaborative care for anxiety by his primary care doctor. Chief complaint is chest pain that led him to going to the emergency room.  Patient is seeking coping skills to deal with anxiety and stress.  Diagnosis: GAD (generalized anxiety disorder)    Goals: Increase healthy adjustment to current life circumstances   Interventions: Motivational Interviewing and CBT Cognitive Behavioral Therapy   Follow-up Plan: Collaborative Care    Session Start time: 9:00    Session End time: 10:00 Total time in minutes: 60 min  Type of Contact:  Face to Face Patient consent obtained:  Yes  Types of Service: Collaborative care   Reason for referral in patient/family's own words:  "I thought I was having a heart attack last week but I think it was a panic attack"   Patient's goal for today's visit: "I want to better deal with life"  History of Present illness:   Patient presented to emergency room last week for chest pain which ended up being a "panic attack". Patient was seen by PCP and started Celexa and Hydroxine. Patient reports that last 4 years have been really rough. Parents passed away in 2020-07-18. Patient quit his job in July 18, 2021 then had heart attack June 11th. 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 Patient explains his anxiety started 10 years ago as he took a leadership role which brought about more pressure and stress in his daily life. Started not sleeping as well ie trouble falling asleep and losing interest in things he use to enjoy. Patients then explains stress from parents aging and became terminal. This pressure to care for parents and work stress took a Psychologist, clinical. Patient reports "I try to be everything to everybody and I just can't do it no more". 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 Says he feels distracted for work  but otherwise functions well. Patient has supportive wife.    Social History:  Household: Lives with wife. She's supportive. Marital status: Married Number of Children: 37 year old son from previous marriage.  Employment: Personnel officer Family: Brothers are not close. Friends: Needs more friends feels like he only has his wife and dogs.  Psychiatric Review of systems: Insomnia: Difficulty  Changes in appetite: "appetite is good" weight fluctuates.  Decreased need for sleep: No Family history of bipolar disorder: No Hallucinations: No   Paranoia: "fear of being disabled"  Traumatic Experiences: History or current traumatic events no  History or current physical trauma?  no History or current emotional trauma?  no History or current sexual trauma?  no History or current domestic or intimate partner violence?  no PTSD symptoms if any traumatic experiences no   Alcohol and/or Substance Use History   Tobacco Alcohol Other substances  Current use 1/2 pack daily Drinks on Saturday Occaisional THC use mainly to sleep  Past use 40 years of smoking Moderate drinker since age 50..1 or 2 drinks  Moderate use since age 66.   Past treatment  No No   Flowsheet Row Office Visit from 06/10/2022 in [facility]  AUDIT-C Score 4         Psychiatric History: Past psychiatry diagnosis: None Patient currently being seen by therapist/psychiatrist: No Prior Suicide Attempts: No  Past psychiatry Hospitalization(s): No Past history of violence: No  Psychotropic medications: Current medications: Patient prescribed Celexa and  hydroxyzine last Friday. Patient taking medications as prescribed: Patient explains that he thinks the Celexa is helping his chest tightness a little bit.  Side effects reported: Dry mouth  Current medications (medication list) Current Outpatient Medications on File Prior to Visit  Medication Sig Dispense Refill   aspirin EC 81 MG tablet Take 1  tablet (81 mg total) by mouth daily. Swallow whole. 150 tablet 2   atorvastatin (LIPITOR) 80 MG tablet Take 1 tablet (80 mg total) by mouth daily. 90 tablet 3   azelastine (ASTELIN) 0.1 % nasal spray Place 2 sprays into both nostrils 2 (two) times daily. 1 mL 2   cetirizine (ZYRTEC) 10 MG tablet Take 10 mg by mouth daily.     citalopram (CELEXA) 20 MG tablet Take 1 tablet (20 mg total) by mouth daily. 30 tablet 3   cyclobenzaprine (FLEXERIL) 5 MG tablet Take 1 tablet (5 mg total) by mouth 3 (three) times daily as needed for muscle spasms. 30 tablet 1   dextromethorphan 15 MG/5ML syrup Take 10 mLs (30 mg total) by mouth 4 (four) times daily as needed for cough. 120 mL 0   docusate sodium (COLACE) 100 MG capsule Take 100 mg by mouth at bedtime.     hydrOXYzine (VISTARIL) 25 MG capsule Take 1 capsule (25 mg total) by mouth every 8 (eight) hours as needed. 30 capsule 0   Melatonin 10 MG TABS Take 20 mg by mouth at bedtime.     nitroGLYCERIN (NITROSTAT) 0.4 MG SL tablet Place 1 tablet (0.4 mg total) under the tongue every 5 (five) minutes as needed for chest pain. 25 tablet 3   ondansetron (ZOFRAN) 4 MG tablet Take 1 tablet (4 mg total) by mouth every 6 (six) hours as needed for nausea or vomiting. 20 tablet 0   oxyCODONE-acetaminophen (PERCOCET/ROXICET) 5-325 MG tablet Take 1 tablet by mouth every 4 (four) hours as needed for severe pain. 10 tablet 0   Probiotic Product (PROBIOTIC PO) Take 1 capsule by mouth daily.     ticagrelor (BRILINTA) 90 MG TABS tablet Take 1 tablet (90 mg total) by mouth 2 (two) times daily. 180 tablet 3   varenicline (CHANTIX) 0.5 MG tablet Take 1 tablet (0.5 mg total) by mouth daily at 2 PM for 3 days, THEN 1 tablet (0.5 mg total) 2 (two) times daily for 3 days, THEN 2 tablets (1 mg total) 2 (two) times daily. 349 tablet 0   No current facility-administered medications on file prior to visit.     Mental status exam:   General Appearance Luretha Murphy:  Neat Eye Contact:   Good Motor Behavior:  Normal Speech:  Normal Level of Consciousness:  Alert Mood:  Anxious Affect:  Appropriate Anxiety Level:  Minimal Thought Process:  Coherent Thought Content:  WNL Perception:  Normal Judgment:  Good Insight:  Present   Clinical Assessment (Review screens that patient completed):  PHQ-9 Assessments:    06/17/2022    9:17 AM 06/10/2022    8:40 AM 04/05/2022    9:24 AM 03/18/2022    2:00 PM 03/11/2022    1:08 PM  Depression screen PHQ 2/9  Decreased Interest 1 0 2 1 0  Down, Depressed, Hopeless 1 1 1  0 0  PHQ - 2 Score 2 1 3 1  0  Altered sleeping 1 1 2 1  0  Tired, decreased energy 2 0 1 1 0  Change in appetite 0 0 0 0 0  Feeling bad or failure about yourself  0 0 0  0 0  Trouble concentrating 0 0 1 0 0  Moving slowly or fidgety/restless 0 0 0 0 0  Suicidal thoughts 0 0 0 0 0  PHQ-9 Score 5 2 7 3  0  Difficult doing work/chores Somewhat difficult Somewhat difficult Somewhat difficult Not difficult at all Not difficult at all    GAD-7 Assessments:    06/17/2022    9:17 AM 06/10/2022    8:47 AM 04/05/2022    9:24 AM 03/18/2022    2:00 PM  GAD 7 : Generalized Anxiety Score  Nervous, Anxious, on Edge 1 0 1 0  Control/stop worrying 1 3 0 0  Worry too much - different things 0 3 0 0  Trouble relaxing 2 1 2 1   Restless 1 3 1  0  Easily annoyed or irritable 2 3 1 1   Afraid - awful might happen 0 0 0 0  Total GAD 7 Score 7 13 5 2   Anxiety Difficulty Somewhat difficult Not difficult at all Somewhat difficult Not difficult at all     Self-harm Behaviors Risk Assessment Self-harm risk factors:  Health Issues Patient endorses recent thoughts of harming self:  No [place] Suicide Severity Rating Scale:   Guns in the home: Yes. Locked up safely.    Protective factors: Supportive Wife,  Danger to Others Risk Assessment Danger to others risk factors:   Patient endorses recent thoughts of harming others:   Dynamic Appraisal of Situational Aggression (DASA):     Please document that emergency resources are discussed with the patient.    Clinician Name & Credentials

## 2022-06-17 NOTE — Patient Instructions (Signed)
Patient to educate self on CBT model to learn how thoughts, emotions and behavior interact.  Pratice grounding techniques when anxiety is activated.  Practice ongoing mindfulness to be more connected to present.  Walk daily. Engage in activities that you have strayed away from like fishing.

## 2022-06-22 ENCOUNTER — Telehealth (INDEPENDENT_AMBULATORY_CARE_PROVIDER_SITE_OTHER): Payer: Managed Care, Other (non HMO) | Admitting: Professional Counselor

## 2022-06-22 DIAGNOSIS — F411 Generalized anxiety disorder: Secondary | ICD-10-CM

## 2022-06-22 DIAGNOSIS — F419 Anxiety disorder, unspecified: Secondary | ICD-10-CM

## 2022-06-22 NOTE — BH Specialist Note (Addendum)
Behavioral Health Treatment Plan Team Note  MRN: [id number] NAME: Derrick Turner  DATE: 06/29/22  Start time: 3:45  End time:  4:00 Total Session time:  15 min Documentation time: 30 min Total time spent on collaboration: 45 min  Total number of Virtual BH Treatment Team Plan encounters: 1/4  Treatment Team Attendees: Dr. Vanetta Shawl and Esmond Harps  Diagnoses:    ICD-10-CM   1. GAD (generalized anxiety disorder)  F41.1       Goals, Interventions and Follow-up Plan Goals: Increase healthy adjustment to current life circumstances Interventions: Motivational Interviewing CBT Cognitive Behavioral Therapy Medication Management Recommendations: Continue citalopram 20 mg daily  Follow-up Plan: Assess further for alcohol use disorder and determine clinet centered goals. Start biweekly counseling  Psychiatrist Recommendation Continue citalopram 20 mg daily  BH specialist to follow up every other week for CBT   History of the present illness Presenting Problem/Current Symptoms:  Patient presented to emergency room last week for chest pain which ended up being a "panic attack". Patient was seen by PCP and started Celexa and Hydroxide. Patient reports that last 4 years have been really rough. Parents passed away in 08/14/2020. Patient quit his job in Aug 14, 2021 then had heart attack June 11th. 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 Patient explains his anxiety started 10 years ago as he took a leadership role which brought about more pressure and stress in his daily life. Started not sleeping as well ie trouble falling asleep and losing interest in things he use to enjoy. Patients then explains stress from parents aging and became terminal. This pressure to care for parents and work stress took a Psychologist, clinical. Patient reports "I try to be everything to everybody and I just can't do it no more". 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  Says he feels distracted for work but otherwise functions well. Patient has supportive wife.    Screenings PHQ-9 Assessments:     06/17/2022    9:17 AM 06/10/2022    8:40 AM 04/05/2022    9:24 AM  Depression screen PHQ 2/9  Decreased Interest 1 0 2  Down, Depressed, Hopeless 1 1 1   PHQ - 2 Score 2 1 3   Altered sleeping 1 1 2   Tired, decreased energy 2 0 1  Change in appetite 0 0 0  Feeling bad or failure about yourself  0 0 0  Trouble concentrating 0 0 1  Moving slowly or fidgety/restless 0 0 0  Suicidal thoughts 0 0 0  PHQ-9 Score 5 2 7   Difficult doing work/chores Somewhat difficult Somewhat difficult Somewhat difficult   GAD-7 Assessments:     06/17/2022    9:17 AM 06/10/2022    8:47 AM 04/05/2022    9:24 AM 03/18/2022    2:00 PM  GAD 7 : Generalized Anxiety Score  Nervous, Anxious, on Edge 1 0 1 0  Control/stop worrying 1 3 0 0  Worry too much - different things 0 3 0 0  Trouble relaxing 2 1 2 1   Restless 1 3 1  0  Easily annoyed or irritable 2 3 1 1   Afraid - awful might happen 0 0 0 0  Total GAD 7 Score 7 13 5 2   Anxiety Difficulty Somewhat difficult Not difficult at all Somewhat difficult Not difficult at all    Past Medical History Past Medical History:  Diagnosis Date   Coronary artery disease cardiologist--- dr Royann Shivers  ETT 04-01-2016 in epic, reproduced chest pain but no EKG changes;  cardiac cath 04-06-2016 moderate diffuse nonobstructive disease, aggressive medical management   History of 2019 novel coronavirus disease (COVID-19) 03/02/2020   per pt tested positive covid, copy of result in epic, stated mild symptoms that resolved   History of kidney stones    Incomplete right bundle branch block    Right ureteral calculus    Wears glasses     Vital signs: There were no vitals filed for this visit.  Allergies:  Allergies as of 06/22/2022   (No Known Allergies)    Medication History Current medications:  Outpatient Encounter Medications as of  06/22/2022  Medication Sig   aspirin EC 81 MG tablet Take 1 tablet (81 mg total) by mouth daily. Swallow whole.   atorvastatin (LIPITOR) 80 MG tablet Take 1 tablet (80 mg total) by mouth daily.   azelastine (ASTELIN) 0.1 % nasal spray Place 2 sprays into both nostrils 2 (two) times daily.   cetirizine (ZYRTEC) 10 MG tablet Take 10 mg by mouth daily.   citalopram (CELEXA) 20 MG tablet Take 1 tablet (20 mg total) by mouth daily.   cyclobenzaprine (FLEXERIL) 5 MG tablet Take 1 tablet (5 mg total) by mouth 3 (three) times daily as needed for muscle spasms.   dextromethorphan 15 MG/5ML syrup Take 10 mLs (30 mg total) by mouth 4 (four) times daily as needed for cough.   docusate sodium (COLACE) 100 MG capsule Take 100 mg by mouth at bedtime.   hydrOXYzine (VISTARIL) 25 MG capsule Take 1 capsule (25 mg total) by mouth every 8 (eight) hours as needed.   Melatonin 10 MG TABS Take 20 mg by mouth at bedtime.   nitroGLYCERIN (NITROSTAT) 0.4 MG SL tablet Place 1 tablet (0.4 mg total) under the tongue every 5 (five) minutes as needed for chest pain.   ondansetron (ZOFRAN) 4 MG tablet Take 1 tablet (4 mg total) by mouth every 6 (six) hours as needed for nausea or vomiting.   oxyCODONE-acetaminophen (PERCOCET/ROXICET) 5-325 MG tablet Take 1 tablet by mouth every 4 (four) hours as needed for severe pain.   Probiotic Product (PROBIOTIC PO) Take 1 capsule by mouth daily.   ticagrelor (BRILINTA) 90 MG TABS tablet Take 1 tablet (90 mg total) by mouth 2 (two) times daily.   varenicline (CHANTIX) 0.5 MG tablet Take 1 tablet (0.5 mg total) by mouth daily at 2 PM for 3 days, THEN 1 tablet (0.5 mg total) 2 (two) times daily for 3 days, THEN 2 tablets (1 mg total) 2 (two) times daily.   No facility-administered encounter medications on file as of 06/22/2022.     Scribe for Treatment Team: Reuel Boom

## 2022-07-02 ENCOUNTER — Other Ambulatory Visit: Payer: Self-pay | Admitting: Family Medicine

## 2022-07-02 DIAGNOSIS — F419 Anxiety disorder, unspecified: Secondary | ICD-10-CM

## 2022-07-06 ENCOUNTER — Ambulatory Visit: Payer: Managed Care, Other (non HMO) | Admitting: Family Medicine

## 2022-07-08 ENCOUNTER — Ambulatory Visit: Payer: 59 | Admitting: Professional Counselor

## 2022-07-08 DIAGNOSIS — F411 Generalized anxiety disorder: Secondary | ICD-10-CM | POA: Diagnosis not present

## 2022-07-08 NOTE — BH Specialist Note (Signed)
Collaborative Care Follow-up  MRN: [id number] NAME: Derrick Turner Date: 07/08/22  Start time: Start Time: 0900 End time: Stop Time: 0930 Total time: Total Time in Minutes (Visit): 30 Call number: Visit Number: 3- Third Visit  Subjective:  The patient is a 52 year old male who presented for a collaborative care follow-up. He reports overall improvement, attributing it to increased mindfulness and awareness of his anxiety, which has helped him prevent panic attacks. The patient has abstained from alcohol since May 15th, expressing concerns about mixing alcohol with his medications and denying any withdrawal symptoms or cravings. He is experiencing shoulder pain that disrupts his sleep, causing difficulty falling asleep and frequent awakenings. His primary concern is work-related stress, as his job requires him to be out of town five days a week, leading to feelings of isolation and distance from his wife. The patient is continuing his current medication regimen and self-care plan. He has benefited from cognitive-behavioral therapy (CBT) education received last week, focusing on mindfulness and thought awareness. His goals include managing his emotions and reducing anger and frustration. The plan is to continue CBT to help him confront and challenge automatic thoughts, find alternative emotional responses, and incorporate anger management techniques for de-escalating tension. PHQ-9 Scores:     07/08/2022    9:06 AM 06/17/2022    9:17 AM 06/10/2022    8:40 AM 04/05/2022    9:24 AM 03/18/2022    2:00 PM  Depression screen PHQ 2/9  Decreased Interest 2 1 0 2 1  Down, Depressed, Hopeless 1 1 1 1  0  PHQ - 2 Score 3 2 1 3 1   Altered sleeping 2 1 1 2 1   Tired, decreased energy 1 2 0 1 1  Change in appetite 1 0 0 0 0  Feeling bad or failure about yourself  0 0 0 0 0  Trouble concentrating 1 0 0 1 0  Moving slowly or fidgety/restless 1 0 0 0 0  Suicidal thoughts 0 0 0 0 0  PHQ-9 Score 9 5 2 7 3    Difficult doing work/chores Somewhat difficult Somewhat difficult Somewhat difficult Somewhat difficult Not difficult at all   GAD-7 Scores:     07/08/2022    9:07 AM 06/17/2022    9:17 AM 06/10/2022    8:47 AM 04/05/2022    9:24 AM  GAD 7 : Generalized Anxiety Score  Nervous, Anxious, on Edge 2 1 0 1  Control/stop worrying 1 1 3  0  Worry too much - different things 1 0 3 0  Trouble relaxing 2 2 1 2   Restless 1 1 3 1   Easily annoyed or irritable 2 2 3 1   Afraid - awful might happen 1 0 0 0  Total GAD 7 Score 10 7 13 5   Anxiety Difficulty Somewhat difficult Somewhat difficult Not difficult at all Somewhat difficult    Stress Current stressors:  Work Sleep:  "still not great.Marland KitchenMarland Kitchen6hrs a night". Patient reports having difficulty falling asleep and waking up 2-3 times a night. Appetite:  Good Coping ability:  Improved Patient taking medications as prescribed:  Patient reports taking his celexa everyday and reports no noticeable changes in mood.   Current medications:  Outpatient Encounter Medications as of 07/08/2022  Medication Sig   aspirin EC 81 MG tablet Take 1 tablet (81 mg total) by mouth daily. Swallow whole.   atorvastatin (LIPITOR) 80 MG tablet Take 1 tablet (80 mg total) by mouth daily.   azelastine (ASTELIN) 0.1 % nasal spray Place 2  sprays into both nostrils 2 (two) times daily.   cetirizine (ZYRTEC) 10 MG tablet Take 10 mg by mouth daily.   citalopram (CELEXA) 20 MG tablet TAKE 1 TABLET BY MOUTH EVERY DAY   cyclobenzaprine (FLEXERIL) 5 MG tablet Take 1 tablet (5 mg total) by mouth 3 (three) times daily as needed for muscle spasms.   dextromethorphan 15 MG/5ML syrup Take 10 mLs (30 mg total) by mouth 4 (four) times daily as needed for cough.   docusate sodium (COLACE) 100 MG capsule Take 100 mg by mouth at bedtime.   hydrOXYzine (VISTARIL) 25 MG capsule Take 1 capsule (25 mg total) by mouth every 8 (eight) hours as needed.   Melatonin 10 MG TABS Take 20 mg by mouth at bedtime.    nitroGLYCERIN (NITROSTAT) 0.4 MG SL tablet Place 1 tablet (0.4 mg total) under the tongue every 5 (five) minutes as needed for chest pain.   ondansetron (ZOFRAN) 4 MG tablet Take 1 tablet (4 mg total) by mouth every 6 (six) hours as needed for nausea or vomiting.   oxyCODONE-acetaminophen (PERCOCET/ROXICET) 5-325 MG tablet Take 1 tablet by mouth every 4 (four) hours as needed for severe pain.   Probiotic Product (PROBIOTIC PO) Take 1 capsule by mouth daily.   ticagrelor (BRILINTA) 90 MG TABS tablet Take 1 tablet (90 mg total) by mouth 2 (two) times daily.   varenicline (CHANTIX) 0.5 MG tablet Take 1 tablet (0.5 mg total) by mouth daily at 2 PM for 3 days, THEN 1 tablet (0.5 mg total) 2 (two) times daily for 3 days, THEN 2 tablets (1 mg total) 2 (two) times daily.   No facility-administered encounter medications on file as of 07/08/2022.     Self-harm Behaviors Risk Assessment Self-harm risk factors:   Patient endorses recent thoughts of harming self:  Denies thoughts, plan or intent.  [place] Suicide Severity Rating Scale: Failed to redirect to the Timeline version of the REVFS SmartLink.   Danger to Others Risk Assessment Danger to others risk factors:  Denies Patient endorses recent thoughts of harming others:    Dynamic Appraisal of Situational Aggression (DASA):      No data to display           Substance Use Assessment Patient recently consumed alcohol:  Yes.  Alcohol Use Disorder Identification Test (AUDIT):     06/10/2022    6:20 AM  Alcohol Use Disorder Test (AUDIT)  1. How often do you have a drink containing alcohol? 3  2. How many drinks containing alcohol do you have on a typical day when you are drinking? 0  3. How often do you have six or more drinks on one occasion? 1  AUDIT-C Score 4  4. How often during the last year have you found that you were not able to stop drinking once you had started? 0  5. How often during the last year have you failed to do what  was normally expected from you because of drinking? 0  6. How often during the last year have you needed a first drink in the morning to get yourself going after a heavy drinking session? 0  7. How often during the last year have you had a feeling of guilt of remorse after drinking? 0  8. How often during the last year have you been unable to remember what happened the night before because you had been drinking? 0  9. Have you or someone else been injured as a result of your drinking?  0  10. Has a relative or friend or a doctor or another health worker been concerned about your drinking or suggested you cut down? 0  Alcohol Use Disorder Identification Test Final Score (AUDIT) 4   Patient recently used drugs:  THC. Patient uses THC to help wind down at night.      Goals, Interventions and Follow-up Plan Goals:  I want to be able to keep my emotions in check. I get angry too much.  Interventions: Behavioral Activation Recommendations: [facility] recommends patient try AA meetings   Reuel Boom

## 2022-07-08 NOTE — Patient Instructions (Signed)
Educate self on anger management  Try AA meetings  Try a period of 30 day abstinence from [facility] to determine the effectiveness of your medication.

## 2022-07-22 ENCOUNTER — Ambulatory Visit: Payer: Managed Care, Other (non HMO) | Admitting: Family Medicine

## 2022-08-05 ENCOUNTER — Encounter: Payer: Self-pay | Admitting: Family Medicine

## 2022-08-05 ENCOUNTER — Ambulatory Visit (INDEPENDENT_AMBULATORY_CARE_PROVIDER_SITE_OTHER): Payer: 59 | Admitting: Professional Counselor

## 2022-08-05 DIAGNOSIS — F411 Generalized anxiety disorder: Secondary | ICD-10-CM

## 2022-08-05 NOTE — BH Specialist Note (Signed)
[facility] Follow-up  MRN: [id number] NAME: Derrick Turner Date: 08/05/22  Start time: Start Time: 1015 End time: Stop Time: 1045 Total time: Total Time in Minutes (Visit): 30 Call number: Visit Number: 4- Fourth Visit  Reason for call today:  Patient is a 52 y.o. male presenting for collaborative care follow up. Patient reports an improvement in overall mood. Patient's chief complaint is work stress and feeling irritability towards co workers. Patient feels like he overreacts and feels tense a lot. [facility] introduced anger management techniques for patient to practice when agitated. [facility] also introduced mindfulness techniques as well. Patient demonstrated an understanding of the techniques and agreed to practice this week and report back the outcome. Patient expressed a desire to try a period of abstinence from alcohol and thc to see if it helps decrease his symptoms. Patient is in the ambivalent stage of change as he feels like he uses alcohol and thc to relax but also wants to know how it would feel to not uses substances. His readiness to change is moderate. Patient will repo  PHQ-9 Scores:     08/05/2022   10:16 AM 07/08/2022    9:06 AM 06/17/2022    9:17 AM 06/10/2022    8:40 AM 04/05/2022    9:24 AM  Depression screen PHQ 2/9  Decreased Interest 1 2 1  0 2  Down, Depressed, Hopeless 1 1 1 1 1   PHQ - 2 Score 2 3 2 1 3   Altered sleeping 2 2 1 1 2   Tired, decreased energy 2 1 2  0 1  Change in appetite 1 1 0 0 0  Feeling bad or failure about yourself  0 0 0 0 0  Trouble concentrating 1 1 0 0 1  Moving slowly or fidgety/restless 0 1 0 0 0  Suicidal thoughts 0 0 0 0 0  PHQ-9 Score 8 9 5 2 7   Difficult doing work/chores Somewhat difficult Somewhat difficult Somewhat difficult Somewhat difficult Somewhat difficult   GAD-7 Scores:     08/05/2022   10:16 AM 07/08/2022    9:07 AM 06/17/2022    9:17 AM 06/10/2022    8:47 AM  GAD 7 : Generalized Anxiety Score  Nervous, Anxious, on Edge 1 2 1  0   Control/stop worrying 0 1 1 3   Worry too much - different things 1 1 0 3  Trouble relaxing 1 2 2 1   Restless 1 1 1 3   Easily annoyed or irritable 2 2 2 3   Afraid - awful might happen 0 1 0 0  Total GAD 7 Score 6 10 7 13   Anxiety Difficulty Somewhat difficult Somewhat difficult Somewhat difficult Not difficult at all    Stress Current stressors:  Work Sleep:  Fair Appetite:  Fair Coping ability:  Fair Patient taking medications as prescribed:  Yes  Current medications:  Outpatient Encounter Medications as of 08/05/2022  Medication Sig   atorvastatin (LIPITOR) 80 MG tablet Take 1 tablet (80 mg total) by mouth daily.   azelastine (ASTELIN) 0.1 % nasal spray Place 2 sprays into both nostrils 2 (two) times daily.   cetirizine (ZYRTEC) 10 MG tablet Take 10 mg by mouth daily.   citalopram (CELEXA) 20 MG tablet TAKE 1 TABLET BY MOUTH EVERY DAY   cyclobenzaprine (FLEXERIL) 5 MG tablet Take 1 tablet (5 mg total) by mouth 3 (three) times daily as needed for muscle spasms.   dextromethorphan 15 MG/5ML syrup Take 10 mLs (30 mg total) by mouth 4 (four) times daily  as needed for cough.   docusate sodium (COLACE) 100 MG capsule Take 100 mg by mouth at bedtime.   hydrOXYzine (VISTARIL) 25 MG capsule Take 1 capsule (25 mg total) by mouth every 8 (eight) hours as needed.   Melatonin 10 MG TABS Take 20 mg by mouth at bedtime.   nitroGLYCERIN (NITROSTAT) 0.4 MG SL tablet Place 1 tablet (0.4 mg total) under the tongue every 5 (five) minutes as needed for chest pain.   ondansetron (ZOFRAN) 4 MG tablet Take 1 tablet (4 mg total) by mouth every 6 (six) hours as needed for nausea or vomiting.   oxyCODONE-acetaminophen (PERCOCET/ROXICET) 5-325 MG tablet Take 1 tablet by mouth every 4 (four) hours as needed for severe pain.   Probiotic Product (PROBIOTIC PO) Take 1 capsule by mouth daily.   ticagrelor (BRILINTA) 90 MG TABS tablet Take 1 tablet (90 mg total) by mouth 2 (two) times daily.   varenicline (CHANTIX)  0.5 MG tablet Take 1 tablet (0.5 mg total) by mouth daily at 2 PM for 3 days, THEN 1 tablet (0.5 mg total) 2 (two) times daily for 3 days, THEN 2 tablets (1 mg total) 2 (two) times daily.   No facility-administered encounter medications on file as of 08/05/2022.     Self-harm Behaviors Risk Assessment Self-harm risk factors:  Low Patient endorses recent thoughts of harming self:  Denies   Danger to Others Risk Assessment Danger to others risk factors:  No risk Patient endorses recent thoughts of harming others:  Denies    Substance Use Assessment Patient recently consumed alcohol:  Yes patient reports a decrease in his alcohol use.   Alcohol Use Disorder Identification Test (AUDIT):     06/10/2022    6:20 AM 08/05/2022   10:41 AM  Alcohol Use Disorder Test (AUDIT)  1. How often do you have a drink containing alcohol? 3 2  2. How many drinks containing alcohol do you have on a typical day when you are drinking? 0 0  3. How often do you have six or more drinks on one occasion? 1 1  AUDIT-C Score 4 3  4. How often during the last year have you found that you were not able to stop drinking once you had started? 0 0  5. How often during the last year have you failed to do what was normally expected from you because of drinking? 0 0  6. How often during the last year have you needed a first drink in the morning to get yourself going after a heavy drinking session? 0 0  7. How often during the last year have you had a feeling of guilt of remorse after drinking? 0 0  8. How often during the last year have you been unable to remember what happened the night before because you had been drinking? 0 0  9. Have you or someone else been injured as a result of your drinking? 0 0  10. Has a relative or friend or a doctor or another health worker been concerned about your drinking or suggested you cut down? 0 0  Alcohol Use Disorder Identification Test Final Score (AUDIT) 4 3   Goals, Interventions  and Follow-up Plan Goals: Increase healthy adjustment to current life circumstances Interventions: Mindfulness or Relaxation Training and Behavioral Activation Follow-up Plan:  Continue Bi-Weekly Counseling   Reuel Boom

## 2022-08-05 NOTE — Patient Instructions (Signed)
Practice anger management skills.  Continue mindfulness practice.

## 2022-08-06 ENCOUNTER — Ambulatory Visit
Admission: EM | Admit: 2022-08-06 | Discharge: 2022-08-06 | Disposition: A | Discharge status: Home / Self Care | Payer: Managed Care, Other (non HMO) | Attending: Family Medicine | Admitting: Family Medicine

## 2022-08-06 ENCOUNTER — Other Ambulatory Visit: Payer: Self-pay

## 2022-08-06 ENCOUNTER — Encounter: Payer: Self-pay | Admitting: Emergency Medicine

## 2022-08-06 DIAGNOSIS — M79641 Pain in right hand: Secondary | ICD-10-CM

## 2022-08-06 DIAGNOSIS — R21 Rash and other nonspecific skin eruption: Secondary | ICD-10-CM

## 2022-08-06 MED ORDER — TRIAMCINOLONE ACETONIDE 0.1 % EX CREA: 1.0000 | TOPICAL_CREAM | Freq: Two times a day (BID) | CUTANEOUS | 0 refills | Status: AC

## 2022-08-06 MED ORDER — PREDNISONE 20 MG PO TABS: 40.0000 mg | ORAL_TABLET | Freq: Every day | ORAL | 0 refills | Status: DC

## 2022-08-06 NOTE — ED Provider Notes (Signed)
[facility]    CSN: [id number] Arrival date & time: 08/06/22  1321      History   Chief Complaint Chief Complaint  Patient presents with   Insect Bite    HPI Derrick Turner is a 52 y.o. male.   Patient presenting today with 2-week history of what started as a small insect bite to the top of right ankle and has continued to grow in size.  Area is intensely itchy but not painful and no drainage, fevers, body aches, chills.  Also now having right hand pain and stiffness since waking up this morning.  Denies known injury, discoloration, history of similar issues.  So far not trying anything over-the-counter for hand pain but trying hydrocortisone with no relief to the rash.    Past Medical History:  Diagnosis Date   Coronary artery disease cardiologist--- dr croitoru   ETT 04-01-2016 in epic, reproduced chest pain but no EKG changes;  cardiac cath 04-06-2016 moderate diffuse nonobstructive disease, aggressive medical management   History of 2019 novel coronavirus disease (COVID-19) 03/02/2020   per pt tested positive covid, copy of result in epic, stated mild symptoms that resolved   History of kidney stones    Incomplete right bundle branch block    Right ureteral calculus    Wears glasses     Patient Active Problem List   Diagnosis Date Noted   Anxiety 06/10/2022   Generalized muscle ache 06/10/2022   Nephrolithiasis 04/05/2022   Right flank pain 03/18/2022   Allergic rhinitis 11/05/2021   Lesion of left ear 11/05/2021   Tinnitus, bilateral 08/04/2021   Atypical chest pain    Acute MI, 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HCC)    S/P partial colectomy 09/02/2020   Dysplastic colon polyp    Diverticulitis of colon without hemorrhage 06/23/2020   Abnormal CT scan, colon 06/23/2020   Hyperlipidemia LDL goal <70 05/03/2016   CAD (coronary artery disease) 05/03/2016   Tobacco abuse    Prediabetes    Gastroesophageal reflux disease    Exertional chest pain 03/31/2016   ACS  (acute coronary syndrome) (HCC) 03/31/2016    Past Surgical History:  Procedure Laterality Date   APPENDECTOMY  teen   BACK SURGERY     two   BIOPSY  08/11/2020   Procedure: BIOPSY;  Surgeon: Lanelle Bal, DO;  Location: [facility];  Service: Endoscopy;;   COLONOSCOPY WITH PROPOFOL N/A 08/11/2020   Procedure: COLONOSCOPY WITH PROPOFOL;  Surgeon: Lanelle Bal, DO;  Location: [facility];  Service: Endoscopy;  Laterality: N/A;  7:30am   CORONARY/GRAFT ACUTE MI REVASCULARIZATION N/A 07/16/2021   Procedure: Coronary/Graft Acute MI Revascularization;  Surgeon: Kathleene Hazel, MD;  Location: [facility];  Service: Cardiovascular;  Laterality: N/A;   CYSTOSCOPY WITH RETROGRADE PYELOGRAM, URETEROSCOPY AND STENT PLACEMENT Right 04/14/2020   Procedure: CYSTOSCOPY WITH RETROGRADE PYELOGRAM, URETEROSCOPY AND STENT PLACEMENT;  Surgeon: Belva Agee, MD;  Location: [facility];  Service: Urology;  Laterality: Right;   EXTRACORPOREAL SHOCK WAVE LITHOTRIPSY Right 03/23/2020   Procedure: EXTRACORPOREAL SHOCK WAVE LITHOTRIPSY (ESWL);  Surgeon: Crista Elliot, MD;  Location: [facility];  Service: Urology;  Laterality: Right;   HOLMIUM LASER APPLICATION Right 04/14/2020   Procedure: HOLMIUM LASER APPLICATION;  Surgeon: Belva Agee, MD;  Location: [facility];  Service: Urology;  Laterality: Right;   LAPAROSCOPIC CHOLECYSTECTOMY  2012 approx   LEFT HEART CATH AND CORONARY ANGIOGRAPHY N/A 04/06/2016   Procedure:  Left Heart Cath and Coronary Angiography;  Surgeon: Tonny Bollman, MD;  Location: [facility]AS[facility] LAB;  Service: Cardiovascular;  Laterality: N/A;   LEFT HEART CATH AND CORONARY ANGIOGRAPHY N/A 07/16/2021   Procedure: LEFT HEART CATH AND CORONARY ANGIOGRAPHY;  Surgeon: Kathleene Hazel, MD;  Location: [facility];  Service: Cardiovascular;  Laterality: N/A;   ORCHIECTOMY Right 1990s   per pt benign cyst    PARTIAL COLECTOMY Right 09/02/2020   Procedure: HEMICOLECTOMY;  Surgeon: Franky Macho, MD;  Location: [facility];  Service: General;  Laterality: Right;   POLYPECTOMY  08/11/2020   Procedure: POLYPECTOMY;  Surgeon: Lanelle Bal, DO;  [facility];  Service: Endoscopy;;   SHOULDER SURGERY Bilateral left 2019;  right 2016   SHOULDER SURGERY Left    SHOULDER SURGERY Right        Home Medications    Prior to Admission medications   Medication Sig Start Date End Date Taking? Authorizing Provider  predniSONE (DELTASONE) 20 MG tablet Take 2 tablets (40 mg total) by mouth daily with breakfast. 08/06/22  Yes Particia Nearing, PA-C  triamcinolone cream (KENALOG) 0.1 % Apply 1 Application topically 2 (two) times daily. 08/06/22  Yes Particia Nearing, PA-C  atorvastatin (LIPITOR) 80 MG tablet Take 1 tablet (80 mg total) by mouth daily. 01/20/22   Croitoru, Mihai, MD  azelastine (ASTELIN) 0.1 % nasal spray Place 2 sprays into both nostrils 2 (two) times daily. 11/29/21   Gilmore Laroche, FNP  cetirizine (ZYRTEC) 10 MG tablet Take 10 mg by mouth daily.    [provider]  citalopram (CELEXA) 20 MG tablet TAKE 1 TABLET BY MOUTH EVERY DAY 07/05/22   Del Newman Nip, Tenna Child, FNP  cyclobenzaprine (FLEXERIL) 5 MG tablet Take 1 tablet (5 mg total) by mouth 3 (three) times daily as needed for muscle spasms. 06/10/22   Del Nigel Berthold, FNP  dextromethorphan 15 MG/5ML syrup Take 10 mLs (30 mg total) by mouth 4 (four) times daily as needed for cough. 11/05/21   Gilmore Laroche, FNP  docusate sodium (COLACE) 100 MG capsule Take 100 mg by mouth at bedtime.    [provider]  hydrOXYzine (VISTARIL) 25 MG capsule Take 1 capsule (25 mg total) by mouth every 8 (eight) hours as needed. Patient not taking: Reported on 08/06/2022 06/10/22   Del Nigel Berthold, FNP  Melatonin 10 MG TABS Take 20 mg by mouth at bedtime.    [provider]  nitroGLYCERIN (NITROSTAT)  0.4 MG SL tablet Place 1 tablet (0.4 mg total) under the tongue every 5 (five) minutes as needed for chest pain. 07/17/21 07/17/22  Duke, Roe Rutherford, PA  ondansetron (ZOFRAN) 4 MG tablet Take 1 tablet (4 mg total) by mouth every 6 (six) hours as needed for nausea or vomiting. 06/23/20   Tiffany Kocher, PA-C  oxyCODONE-acetaminophen (PERCOCET/ROXICET) 5-325 MG tablet Take 1 tablet by mouth every 4 (four) hours as needed for severe pain. 04/05/22   Gilmore Laroche, FNP  Probiotic Product (PROBIOTIC PO) Take 1 capsule by mouth daily.    [provider]  ticagrelor (BRILINTA) 90 MG TABS tablet Take 1 tablet (90 mg total) by mouth 2 (two) times daily. 01/20/22   Croitoru, Mihai, MD  varenicline (CHANTIX) 0.5 MG tablet Take 1 tablet (0.5 mg total) by mouth daily at 2 PM for 3 days, THEN 1 tablet (0.5 mg total) 2 (two) times daily for 3 days, THEN 2 tablets (1 mg total) 2 (two)  times daily. 06/01/22 08/23/22  Gilmore Laroche, FNP    Family History Family History  Problem Relation Age of Onset   Heart attack Father    Heart attack Maternal Grandmother    Heart attack Maternal Grandfather    Colon cancer Neg Hx    Colon polyps Neg Hx     Social History Social History   Tobacco Use   Smoking status: Every Day    Packs/day: 1.50    Years: 35.00    Additional pack years: 0.00    Total pack years: 52.50    Types: Cigarettes   Smokeless tobacco: Never  Vaping Use   Vaping Use: Never used  Substance Use Topics   Alcohol use: Yes    Comment: occasional   Drug use: Never     Allergies   Patient has no known allergies.   Review of Systems Review of Systems PER HPI  Physical Exam Triage Vital Signs ED Triage Vitals  Enc Vitals Group     BP 08/06/22 1353 121/76     Pulse Rate 08/06/22 1353 90     Resp 08/06/22 1353 20     Temp 08/06/22 1353 98.2 F (36.8 C)     Temp Source 08/06/22 1353 Oral     SpO2 08/06/22 1353 93 %     Weight --      Height --      Head Circumference  --      Peak Flow --      Pain Score 08/06/22 1352 5     Pain Loc --      Pain Edu? --      Excl. in GC? --    No data found.  Updated Vital Signs BP 121/76 (BP Location: Right Arm)   Pulse 90   Temp 98.2 F (36.8 C) (Oral)   Resp 20   SpO2 93%   Visual Acuity Right Eye Distance:   Left Eye Distance:   Bilateral Distance:    Right Eye Near:   Left Eye Near:    Bilateral Near:     Physical Exam Vitals and nursing note reviewed.  Constitutional:      Appearance: Normal appearance.  HENT:     Head: Atraumatic.  Eyes:     Extraocular Movements: Extraocular movements intact.     Conjunctiva/sclera: Conjunctivae normal.  Cardiovascular:     Rate and Rhythm: Normal rate and regular rhythm.  Pulmonary:     Effort: Pulmonary effort is normal.     Breath sounds: Normal breath sounds.  Musculoskeletal:        General: Swelling and tenderness present. No deformity. Normal range of motion.     Cervical back: Normal range of motion and neck supple.     Comments: Trace edema to the right dorsal hand worse near the fourth and fifth MCP.  No bony deformity palpable  Skin:    General: Skin is warm and dry.     Findings: Erythema and rash present.     Comments: Squared off erythematous region to anterior right ankle with erythematous papular pattern within  Neurological:     General: No focal deficit present.     Mental Status: He is oriented to person, place, and time.     Comments: All 4 extremities neurovascularly intact  Psychiatric:        Mood and Affect: Mood normal.        Thought Content: Thought content normal.        Judgment:  Judgment normal.    UC Treatments / Results  Labs (all labs ordered are listed, but only abnormal results are displayed) Labs Reviewed - No data to display  EKG   Radiology No results found.  Procedures Procedures (including critical care time)  Medications Ordered in UC Medications - No data to display  Initial Impression /  Assessment and Plan / UC Course  I have reviewed the triage vital signs and the nursing notes.  Pertinent labs & imaging results that were available during my care of the patient were reviewed by me and considered in my medical decision making (see chart for details).     Area on ankle appears to be an allergic dermatitis.  Treat with triamcinolone cream, prednisone, antihistamines, ice, elevation.  Regarding the hand pain and stiffness, hoping the prednisone will also improve this.  Epsom salt soaks, elevation additionally.  Return for any worsening symptoms. Final Clinical Impressions(s) / UC Diagnoses   Final diagnoses:  Right hand pain  Rash and nonspecific skin eruption   Discharge Instructions   None    ED Prescriptions     Medication Sig Dispense Auth. Provider   predniSONE (DELTASONE) 20 MG tablet Take 2 tablets (40 mg total) by mouth daily with breakfast. 10 tablet Particia Nearing, PA-C   triamcinolone cream (KENALOG) 0.1 % Apply 1 Application topically 2 (two) times daily. 80 g Particia Nearing, [place]      PDMP not reviewed this encounter.   Particia Nearing, [place] 08/06/22 1458

## 2022-08-06 NOTE — ED Triage Notes (Signed)
Pt reports right insect bite to top of right ankle x2 weeks and reports "red square of redness" ever since.   Pt also reports right hand tightness since this am. Denies any known injury.

## 2022-08-19 ENCOUNTER — Ambulatory Visit (INDEPENDENT_AMBULATORY_CARE_PROVIDER_SITE_OTHER): Payer: 59 | Admitting: Professional Counselor

## 2022-08-19 DIAGNOSIS — F411 Generalized anxiety disorder: Secondary | ICD-10-CM

## 2022-08-19 NOTE — BH Specialist Note (Signed)
[place] BH Follow-up  MRN: [id number] NAME: Derrick Turner Date: 08/19/22  Start time: Start Time: 1015 End time: Stop Time: 1045 Total time: Total Time in Minutes (Visit): 30 Call number: Visit Number: 6-Sixth Visit  Reason for call today: The patient is a 52 year old male presenting for a collaborative care follow-up. He reports experiencing a panic attack this past week while in a crowded grocery store. He described feeling tense, boxed in, experiencing chest tightness, and having sensations like the walls were closing in. After exiting the store and taking hydroxyzine, he felt much better and calm. The patient expressed confusion about why the panic attack occurred as he reports having a good couple of weeks.  He continues to struggle with having to work out of town and expresses a desire to be home more, reporting a wish to find another job. The patient mentioned he has not used THC in three days and is attempting to quit entirely to find a new job and avoid depending on THC to help him fall asleep. Patient is open to a possible sleep medication.  The behavioral health counselor provided information on sleep hygiene and discussed mindfulness techniques, referring to the homework sheet provided at the last visit. The patient admitted to not engaging in mindfulness practices but stated he would attempt to practice this week to see if it helps.   The patient also mentioned the possibility of increasing his medication, feeling that he has gotten used to the current dose and it is helping. He is open to an increase if recommended. He will report back in two weeks.   PHQ-9 Scores:     08/05/2022   10:16 AM 07/08/2022    9:06 AM 06/17/2022    9:17 AM 06/10/2022    8:40 AM 04/05/2022    9:24 AM  Depression screen PHQ 2/9  Decreased Interest 1 2 1  0 2  Down, Depressed, Hopeless 1 1 1 1 1   PHQ - 2 Score 2 3 2 1 3   Altered sleeping 2 2 1 1 2   Tired, decreased energy 2 1 2  0 1  Change in  appetite 1 1 0 0 0  Feeling bad or failure about yourself  0 0 0 0 0  Trouble concentrating 1 1 0 0 1  Moving slowly or fidgety/restless 0 1 0 0 0  Suicidal thoughts 0 0 0 0 0  PHQ-9 Score 8 9 5 2 7   Difficult doing work/chores Somewhat difficult Somewhat difficult Somewhat difficult Somewhat difficult Somewhat difficult   GAD-7 Scores:     08/05/2022   10:16 AM 07/08/2022    9:07 AM 06/17/2022    9:17 AM 06/10/2022    8:47 AM  GAD 7 : Generalized Anxiety Score  Nervous, Anxious, on Edge 1 2 1  0  Control/stop worrying 0 1 1 3   Worry too much - different things 1 1 0 3  Trouble relaxing 1 2 2 1   Restless 1 1 1 3   Easily annoyed or irritable 2 2 2 3   Afraid - awful might happen 0 1 0 0  Total GAD 7 Score 6 10 7 13   Anxiety Difficulty Somewhat difficult Somewhat difficult Somewhat difficult Not difficult at all    Stress Current stressors:  Stopping THC use Sleep:  Disrupted Appetite:  Good Coping ability:  Good Patient taking medications as prescribed:  Yes  Current medications:  Outpatient Encounter Medications as of 08/19/2022  Medication Sig   atorvastatin (LIPITOR) 80 MG tablet Take 1  tablet (80 mg total) by mouth daily.   azelastine (ASTELIN) 0.1 % nasal spray Place 2 sprays into both nostrils 2 (two) times daily.   cetirizine (ZYRTEC) 10 MG tablet Take 10 mg by mouth daily.   citalopram (CELEXA) 20 MG tablet TAKE 1 TABLET BY MOUTH EVERY DAY   cyclobenzaprine (FLEXERIL) 5 MG tablet Take 1 tablet (5 mg total) by mouth 3 (three) times daily as needed for muscle spasms.   dextromethorphan 15 MG/5ML syrup Take 10 mLs (30 mg total) by mouth 4 (four) times daily as needed for cough.   docusate sodium (COLACE) 100 MG capsule Take 100 mg by mouth at bedtime.   hydrOXYzine (VISTARIL) 25 MG capsule Take 1 capsule (25 mg total) by mouth every 8 (eight) hours as needed. (Patient not taking: Reported on 08/06/2022)   Melatonin 10 MG TABS Take 20 mg by mouth at bedtime.   nitroGLYCERIN  (NITROSTAT) 0.4 MG SL tablet Place 1 tablet (0.4 mg total) under the tongue every 5 (five) minutes as needed for chest pain.   ondansetron (ZOFRAN) 4 MG tablet Take 1 tablet (4 mg total) by mouth every 6 (six) hours as needed for nausea or vomiting.   oxyCODONE-acetaminophen (PERCOCET/ROXICET) 5-325 MG tablet Take 1 tablet by mouth every 4 (four) hours as needed for severe pain.   predniSONE (DELTASONE) 20 MG tablet Take 2 tablets (40 mg total) by mouth daily with breakfast.   Probiotic Product (PROBIOTIC PO) Take 1 capsule by mouth daily.   ticagrelor (BRILINTA) 90 MG TABS tablet Take 1 tablet (90 mg total) by mouth 2 (two) times daily.   triamcinolone cream (KENALOG) 0.1 % Apply 1 Application topically 2 (two) times daily.   varenicline (CHANTIX) 0.5 MG tablet Take 1 tablet (0.5 mg total) by mouth daily at 2 PM for 3 days, THEN 1 tablet (0.5 mg total) 2 (two) times daily for 3 days, THEN 2 tablets (1 mg total) 2 (two) times daily.   No facility-administered encounter medications on file as of 08/19/2022.    Self-harm Behaviors Risk Assessment Self-harm risk factors:   Patient endorses recent thoughts of harming self:    [place] Suicide Severity Rating Scale:  Flowsheet Row ED from 08/06/2022 in [facility] ED from 10/17/2021 in [facility] at [facility] ED from 07/27/2021 in [facility] Emergency Department at [facility]  C-SSRS RISK CATEGORY No Risk No Risk No Risk        Danger to Others Risk Assessment Danger to others risk factors:  No risk Patient endorses recent thoughts of harming others:  Denies   Goals, Interventions and Follow-up Plan Goals: Increase healthy adjustment to current life circumstances Interventions: CBT Cognitive Behavioral Therapy Follow-up Plan:  Bi-Weekly Counseling  Reuel Boom

## 2022-08-19 NOTE — Patient Instructions (Signed)
Continue mindfulness plan.

## 2022-09-02 ENCOUNTER — Encounter: Payer: Self-pay | Admitting: Professional Counselor

## 2022-09-02 ENCOUNTER — Ambulatory Visit (INDEPENDENT_AMBULATORY_CARE_PROVIDER_SITE_OTHER): Payer: Managed Care, Other (non HMO) | Admitting: Professional Counselor

## 2022-09-02 DIAGNOSIS — F411 Generalized anxiety disorder: Secondary | ICD-10-CM

## 2022-09-02 NOTE — BH Specialist Note (Unsigned)
[facility] BH Telephone Follow-up  MRN: [id number] NAME: Derrick Turner Date: 09/05/22  Start time: Start Time: 1100 End time: Stop Time: 1130 Total time: Total Time in Minutes (Visit): 30 Call number: Visit Number: Additional Visit  Reason for call today:   The patient is a 52 year old male returning for a collaborative care follow-up. He arrives on time, engages well, and displays an appropriate affect. His primary concern is difficulty sleeping since ceasing THC use, as he aims to stop using THC to secure a new job. He is two weeks into his abstinence from [facility] and feels pleased with his progress. However, he notes negative consequences from quitting, as he previously used THC to help fall asleep and reduce pre-sleep anxiety. He is considering seeking a sleep aid to assist during this adjustment period.  The patient continues to experience back pain, which adds to his stress, and reports ongoing anxiety and irritability at work, resulting in [employer] interactions with staff. Patient reports his current job is his main stressor. Despite these challenges, he is actively working on managing his anger using tools discussed in previous sessions, noting some improvement but acknowledging he has not yet reached his desired state. The behavioral health counselor reinforced the importance of mindfulness techniques to help manage his anxiety. The patient will follow up in two weeks to report his progress.  PHQ-9 Scores:     09/02/2022   11:30 AM 08/19/2022   10:17 AM 08/05/2022   10:16 AM 07/08/2022    9:06 AM 06/17/2022    9:17 AM  Depression screen PHQ 2/9  Decreased Interest 1 1 1 2 1   Down, Depressed, Hopeless 0 1 1 1 1   PHQ - 2 Score 1 2 2 3 2   Altered sleeping 2 2 2 2 1   Tired, decreased energy 1 1 2 1 2   Change in appetite 0 1 1 1  0  Feeling bad or failure about yourself  0 0 0 0 0  Trouble concentrating 1 1 1 1  0  Moving slowly or fidgety/restless 0 1 0 1 0  Suicidal thoughts 0 0  0 0 0  PHQ-9 Score 5 8 8 9 5   Difficult doing work/chores Somewhat difficult  Somewhat difficult Somewhat difficult Somewhat difficult   GAD-7 Scores:     09/02/2022   11:31 AM 08/05/2022   10:16 AM 07/08/2022    9:07 AM 06/17/2022    9:17 AM  GAD 7 : Generalized Anxiety Score  Nervous, Anxious, on Edge 1 1 2 1   Control/stop worrying 1 0 1 1  Worry too much - different things 0 1 1 0  Trouble relaxing 2 1 2 2   Restless 1 1 1 1   Easily annoyed or irritable 1 2 2 2   Afraid - awful might happen 0 0 1 0  Total GAD 7 Score 6 6 10 7   Anxiety Difficulty Somewhat difficult Somewhat difficult Somewhat difficult Somewhat difficult    Stress Current stressors:  Pain, work and lack of sleep Sleep:  Trouble falling asleep Appetite:  Good Coping ability:  Fair Patient taking medications as prescribed:  Yes  Current medications:  Outpatient Encounter Medications as of 09/02/2022  Medication Sig   atorvastatin (LIPITOR) 80 MG tablet Take 1 tablet (80 mg total) by mouth daily.   azelastine (ASTELIN) 0.1 % nasal spray Place 2 sprays into both nostrils 2 (two) times daily.   cetirizine (ZYRTEC) 10 MG tablet Take 10 mg by mouth daily.   citalopram (CELEXA) 20 MG tablet  TAKE 1 TABLET BY MOUTH EVERY DAY   cyclobenzaprine (FLEXERIL) 5 MG tablet Take 1 tablet (5 mg total) by mouth 3 (three) times daily as needed for muscle spasms.   dextromethorphan 15 MG/5ML syrup Take 10 mLs (30 mg total) by mouth 4 (four) times daily as needed for cough.   docusate sodium (COLACE) 100 MG capsule Take 100 mg by mouth at bedtime.   hydrOXYzine (VISTARIL) 25 MG capsule Take 1 capsule (25 mg total) by mouth every 8 (eight) hours as needed. (Patient not taking: Reported on 08/06/2022)   Melatonin 10 MG TABS Take 20 mg by mouth at bedtime.   nitroGLYCERIN (NITROSTAT) 0.4 MG SL tablet Place 1 tablet (0.4 mg total) under the tongue every 5 (five) minutes as needed for chest pain.   ondansetron (ZOFRAN) 4 MG tablet Take 1  tablet (4 mg total) by mouth every 6 (six) hours as needed for nausea or vomiting.   oxyCODONE-acetaminophen (PERCOCET/ROXICET) 5-325 MG tablet Take 1 tablet by mouth every 4 (four) hours as needed for severe pain.   predniSONE (DELTASONE) 20 MG tablet Take 2 tablets (40 mg total) by mouth daily with breakfast.   Probiotic Product (PROBIOTIC PO) Take 1 capsule by mouth daily.   ticagrelor (BRILINTA) 90 MG TABS tablet Take 1 tablet (90 mg total) by mouth 2 (two) times daily.   triamcinolone cream (KENALOG) 0.1 % Apply 1 Application topically 2 (two) times daily.   No facility-administered encounter medications on file as of 09/02/2022.     Self-harm Behaviors Risk Assessment Self-harm risk factors:   Patient endorses recent thoughts of harming self:    [place] Suicide Severity Rating Scale: Failed to redirect to the Timeline version of the REVFS SmartLink.   Danger to Others Risk Assessment Danger to others risk factors:   Patient endorses recent thoughts of harming others:    Dynamic Appraisal of Situational Aggression (DASA):      No data to display           Substance Use Assessment Patient recently consumed alcohol:  Yes- same quantity.  Alcohol Use Disorder Identification Test (AUDIT):     06/10/2022    6:20 AM 08/05/2022   10:41 AM  Alcohol Use Disorder Test (AUDIT)  1. How often do you have a drink containing alcohol? 3 2  2. How many drinks containing alcohol do you have on a typical day when you are drinking? 0 0  3. How often do you have six or more drinks on one occasion? 1 1  AUDIT-C Score 4 3  4. How often during the last year have you found that you were not able to stop drinking once you had started? 0 0  5. How often during the last year have you failed to do what was normally expected from you because of drinking? 0 0  6. How often during the last year have you needed a first drink in the morning to get yourself going after a heavy drinking session? 0 0  7.  How often during the last year have you had a feeling of guilt of remorse after drinking? 0 0  8. How often during the last year have you been unable to remember what happened the night before because you had been drinking? 0 0  9. Have you or someone else been injured as a result of your drinking? 0 0  10. Has a relative or friend or a doctor or another health worker been concerned about your drinking or  suggested you cut down? 0 0  Alcohol Use Disorder Identification Test Final Score (AUDIT) 4 3    Goals, Interventions and Follow-up Plan Goals: Increase healthy adjustment to current life circumstances Interventions: Mindfulness or Relaxation Training and CBT Cognitive Behavioral Therapy Follow-up Plan:  2 weeks  Derrick Turner

## 2022-09-06 ENCOUNTER — Other Ambulatory Visit: Payer: Self-pay | Admitting: Family Medicine

## 2022-09-16 ENCOUNTER — Ambulatory Visit: Payer: 59 | Admitting: Professional Counselor

## 2022-09-16 ENCOUNTER — Ambulatory Visit: Payer: Self-pay | Admitting: Cardiovascular Disease

## 2022-09-16 ENCOUNTER — Ambulatory Visit (INDEPENDENT_AMBULATORY_CARE_PROVIDER_SITE_OTHER): Payer: 59 | Admitting: Professional Counselor

## 2022-09-16 DIAGNOSIS — F411 Generalized anxiety disorder: Secondary | ICD-10-CM

## 2022-09-16 NOTE — Progress Notes (Signed)
Error

## 2022-09-16 NOTE — BH Specialist Note (Signed)
[place] BH Follow-up  MRN: [id number] NAME: Derrick Turner Date: 09/16/22  Start time: Start Time: 0130 End time: Stop Time: 0200 Total time: Total Time in Minutes (Visit): 30 Call number: Visit Number: Additional Visit  Reason for visit today:  The patient, a 52 year old male, returned for a collaborative care follow-up in a positive mood, displaying good eye contact and a bright affect. He reported that he recently relapsed in his [facility] use after making it three weeks without it. The relapse was triggered by difficulties in falling asleep, which has been a challenge for him. The patient's current goal is to abstain from [facility] use so that he can pass a drug screen and secure a new job. However, his struggle with sleep led to his recent relapse.  In response, the behavioral counselor reached out to the patient's primary care provider to explore potential sleep aids that could help him avoid relying on THC. The primary care physician recommended hydroxazine to be taken at night, as it may help with relaxation, drowsiness, and anxiety, potentially making it easier for the patient to fall asleep. The patient was receptive to this recommendation and expressed a renewed commitment to achieving 30 days of abstinence.  Aside from this, the patient reported a decrease in anxiety and no panic attacks since his last visit. He noted that his anxiety and depression are currently well managed. His main source of stress is work-related; he feels that finding a new job with less travel and physical labor would reduce his stress and pain, contributing to his overall well-being. This is his current goal. A follow-up is scheduled in two weeks to discuss relapse prevention strategies and to support the patient in meeting his goals.    PHQ-9 Scores:     09/16/2022    1:57 PM 09/02/2022   11:30 AM 08/19/2022   10:17 AM 08/05/2022   10:16 AM 07/08/2022    9:06 AM  Depression screen PHQ 2/9  Decreased Interest 1 1 1  1 2   Down, Depressed, Hopeless 0 0 1 1 1   PHQ - 2 Score 1 1 2 2 3   Altered sleeping 2 2 2 2 2   Tired, decreased energy 1 1 1 2 1   Change in appetite 0 0 1 1 1   Feeling bad or failure about yourself  0 0 0 0 0  Trouble concentrating 0 1 1 1 1   Moving slowly or fidgety/restless 0 0 1 0 1  Suicidal thoughts 0 0 0 0 0  PHQ-9 Score 4 5 8 8 9   Difficult doing work/chores Somewhat difficult Somewhat difficult  Somewhat difficult Somewhat difficult   GAD-7 Scores:     09/16/2022    1:59 PM 09/02/2022   11:31 AM 08/05/2022   10:16 AM 07/08/2022    9:07 AM  GAD 7 : Generalized Anxiety Score  Nervous, Anxious, on Edge 1 1 1 2   Control/stop worrying 0 1 0 1  Worry too much - different things 1 0 1 1  Trouble relaxing 1 2 1 2   Restless 1 1 1 1   Easily annoyed or irritable 1 1 2 2   Afraid - awful might happen 0 0 0 1  Total GAD 7 Score 5 6 6 10   Anxiety Difficulty Somewhat difficult Somewhat difficult Somewhat difficult Somewhat difficult    Stress Current stressors:  Work, travel  Sleep:  Difficulty falling asleep Appetite:  Good Coping ability:  Good Patient taking medications as prescribed:  Yes  Current medications:  Outpatient  Encounter Medications as of 09/16/2022  Medication Sig   atorvastatin (LIPITOR) 80 MG tablet Take 1 tablet (80 mg total) by mouth daily.   azelastine (ASTELIN) 0.1 % nasal spray Place 2 sprays into both nostrils 2 (two) times daily.   cetirizine (ZYRTEC) 10 MG tablet Take 10 mg by mouth daily.   citalopram (CELEXA) 20 MG tablet TAKE 1 TABLET BY MOUTH EVERY DAY   cyclobenzaprine (FLEXERIL) 5 MG tablet Take 1 tablet (5 mg total) by mouth 3 (three) times daily as needed for muscle spasms.   dextromethorphan 15 MG/5ML syrup Take 10 mLs (30 mg total) by mouth 4 (four) times daily as needed for cough.   docusate sodium (COLACE) 100 MG capsule Take 100 mg by mouth at bedtime.   hydrOXYzine (VISTARIL) 25 MG capsule Take 1 capsule (25 mg total) by mouth every 8 (eight)  hours as needed. (Patient not taking: Reported on 08/06/2022)   Melatonin 10 MG TABS Take 20 mg by mouth at bedtime.   nitroGLYCERIN (NITROSTAT) 0.4 MG SL tablet Place 1 tablet (0.4 mg total) under the tongue every 5 (five) minutes as needed for chest pain.   ondansetron (ZOFRAN) 4 MG tablet Take 1 tablet (4 mg total) by mouth every 6 (six) hours as needed for nausea or vomiting.   oxyCODONE-acetaminophen (PERCOCET/ROXICET) 5-325 MG tablet Take 1 tablet by mouth every 4 (four) hours as needed for severe pain.   predniSONE (DELTASONE) 20 MG tablet Take 2 tablets (40 mg total) by mouth daily with breakfast.   Probiotic Product (PROBIOTIC PO) Take 1 capsule by mouth daily.   ticagrelor (BRILINTA) 90 MG TABS tablet Take 1 tablet (90 mg total) by mouth 2 (two) times daily.   triamcinolone cream (KENALOG) 0.1 % Apply 1 Application topically 2 (two) times daily.   No facility-administered encounter medications on file as of 09/16/2022.     Self-harm Behaviors Risk Assessment Self-harm risk factors:  Low Patient endorses recent thoughts of harming self:  Denies   Danger to Others Risk Assessment Danger to others risk factors:  None Patient endorses recent thoughts of harming others:  Denies    Substance Use Assessment Patient recently consumed alcohol:  Yes  Alcohol Use Disorder Identification Test (AUDIT):     06/10/2022    6:20 AM 08/05/2022   10:41 AM  Alcohol Use Disorder Test (AUDIT)  1. How often do you have a drink containing alcohol? 3 2  2. How many drinks containing alcohol do you have on a typical day when you are drinking? 0 0  3. How often do you have six or more drinks on one occasion? 1 1  AUDIT-C Score 4 3  4. How often during the last year have you found that you were not able to stop drinking once you had started? 0 0  5. How often during the last year have you failed to do what was normally expected from you because of drinking? 0 0  6. How often during the last year have  you needed a first drink in the morning to get yourself going after a heavy drinking session? 0 0  7. How often during the last year have you had a feeling of guilt of remorse after drinking? 0 0  8. How often during the last year have you been unable to remember what happened the night before because you had been drinking? 0 0  9. Have you or someone else been injured as a result of your drinking? 0 0  10. Has a relative or friend or a doctor or another health worker been concerned about your drinking or suggested you cut down? 0 0  Alcohol Use Disorder Identification Test Final Score (AUDIT) 4 3    Goals, Interventions and Follow-up Plan Goals: Increase healthy adjustment to current life circumstances Interventions: Motivational Interviewing and Behavioral Activation Follow-up Plan:  Follow up in two weeks    Reuel Boom

## 2022-09-21 ENCOUNTER — Other Ambulatory Visit: Payer: Self-pay | Admitting: Family Medicine

## 2022-09-21 DIAGNOSIS — F419 Anxiety disorder, unspecified: Secondary | ICD-10-CM

## 2022-09-22 MED ORDER — HYDROXYZINE PAMOATE 25 MG PO CAPS
25.0000 mg | ORAL_CAPSULE | Freq: Three times a day (TID) | ORAL | 0 refills | Status: DC | PRN
Start: 2022-09-22 — End: 2022-11-01

## 2022-09-23 ENCOUNTER — Ambulatory Visit (HOSPITAL_BASED_OUTPATIENT_CLINIC_OR_DEPARTMENT_OTHER): Payer: Managed Care, Other (non HMO)

## 2022-09-23 ENCOUNTER — Ambulatory Visit (HOSPITAL_BASED_OUTPATIENT_CLINIC_OR_DEPARTMENT_OTHER): Payer: Managed Care, Other (non HMO) | Admitting: Orthopaedic Surgery

## 2022-09-23 ENCOUNTER — Encounter (HOSPITAL_BASED_OUTPATIENT_CLINIC_OR_DEPARTMENT_OTHER): Payer: Self-pay | Admitting: Orthopaedic Surgery

## 2022-09-23 DIAGNOSIS — M79641 Pain in right hand: Secondary | ICD-10-CM

## 2022-09-23 DIAGNOSIS — M79642 Pain in left hand: Secondary | ICD-10-CM

## 2022-09-23 NOTE — Progress Notes (Signed)
Chief Complaint: Right ring finger, left index finger trigger finger     History of Present Illness:    Derrick Turner is a 52 y.o. male with trigger finger of the right ring finger as well as the left index finger.  This has been bothering him for the last several months.  The right ring finger is getting stuck at this point.  He needs to passively extend this.  He has been taking Tylenol for this.  He works as an Personnel officer.    Surgical History:   None  PMH/PSH/Family History/Social History/Meds/Allergies:    Past Medical History:  Diagnosis Date  . Coronary artery disease cardiologist--- dr croitoru   ETT 04-01-2016 in epic, reproduced chest pain but no EKG changes;  cardiac cath 04-06-2016 moderate diffuse nonobstructive disease, aggressive medical management  . History of 2019 novel coronavirus disease (COVID-19) 03/02/2020   per pt tested positive covid, copy of result in epic, stated mild symptoms that resolved  . History of kidney stones   . Incomplete right bundle branch block   . Right ureteral calculus   . Wears glasses    Past Surgical History:  Procedure Laterality Date  . APPENDECTOMY  teen  . BACK SURGERY     two  . BIOPSY  08/11/2020   Procedure: BIOPSY;  Surgeon: Lanelle Bal, DO;  [facility];  Service: Endoscopy;;  . COLONOSCOPY WITH PROPOFOL N/A 08/11/2020   Procedure: COLONOSCOPY WITH PROPOFOL;  Surgeon: Lanelle Bal, DO;  [facility];  Service: Endoscopy;  Laterality: N/A;  7:30am  . CORONARY/GRAFT ACUTE MI REVASCULARIZATION N/A 07/16/2021   Procedure: Coronary/Graft Acute MI Revascularization;  Surgeon: Kathleene Hazel, MD;  [facility]: [facility];  Service: Cardiovascular;  Laterality: N/A;  . CYSTOSCOPY WITH RETROGRADE PYELOGRAM, URETEROSCOPY AND STENT PLACEMENT Right 04/14/2020   Procedure: CYSTOSCOPY WITH RETROGRADE PYELOGRAM, URETEROSCOPY AND STENT PLACEMENT;  Surgeon:  Belva Agee, MD;  Location: [facility];  Service: Urology;  Laterality: Right;  . EXTRACORPOREAL SHOCK WAVE LITHOTRIPSY Right 03/23/2020   Procedure: EXTRACORPOREAL SHOCK WAVE LITHOTRIPSY (ESWL);  Surgeon: Crista Elliot, MD;  Location: [facility];  Service: Urology;  Laterality: Right;  . HOLMIUM LASER APPLICATION Right 04/14/2020   Procedure: HOLMIUM LASER APPLICATION;  Surgeon: Belva Agee, MD;  Location: [facility];  Service: Urology;  Laterality: Right;  . LAPAROSCOPIC CHOLECYSTECTOMY  2012 approx  . LEFT HEART CATH AND CORONARY ANGIOGRAPHY N/A 04/06/2016   Procedure: Left Heart Cath and Coronary Angiography;  Surgeon: Tonny Bollman, MD;  Location: [facility]AS[facility];  Service: Cardiovascular;  Laterality: N/A;  . LEFT HEART CATH AND CORONARY ANGIOGRAPHY N/A 07/16/2021   Procedure: LEFT HEART CATH AND CORONARY ANGIOGRAPHY;  Surgeon: Kathleene Hazel, MD;  [facility]: [facility];  Service: Cardiovascular;  Laterality: N/A;  . ORCHIECTOMY Right 1990s   per pt benign cyst  . PARTIAL COLECTOMY Right 09/02/2020   Procedure: HEMICOLECTOMY;  Surgeon: Franky Macho, MD;  Location: [facility];  Service: General;  Laterality: Right;  . POLYPECTOMY  08/11/2020   Procedure: POLYPECTOMY;  Surgeon: Lanelle Bal, DO;  [facility];  Service: Endoscopy;;  . SHOULDER SURGERY Bilateral left 2019;  right 2016  . SHOULDER SURGERY Left   .  SHOULDER SURGERY Right    Social History   Socioeconomic History  . Marital status: Married    Spouse name: Not on file  . Number of children: Not on file  . Years of education: Not on file  . Highest education level: GED or equivalent  Occupational History  . Not on file  Tobacco Use  . Smoking status: Every Day    Current packs/day: 1.50    Average packs/day: 1.5 packs/day for 35.0 years (52.5 ttl pk-yrs)    Types: Cigarettes  . Smokeless tobacco: Never  Vaping Use  . Vaping  status: Never Used  Substance and Sexual Activity  . Alcohol use: Yes    Comment: occasional  . Drug use: Never  . Sexual activity: Not on file  Other Topics Concern  . Not on file  Social History Narrative  . Not on file   Social Determinants of Health   Financial Resource Strain: Low Risk  (06/10/2022)   Overall Financial Resource Strain (CARDIA)   . Difficulty of Paying Living Expenses: Not hard at all  Food Insecurity: No Food Insecurity (06/10/2022)   Hunger Vital Sign   . Worried About Programme researcher, broadcasting/film/video in the Last Year: Never true   . Ran Out of Food in the Last Year: Never true  Transportation Needs: No Transportation Needs (06/10/2022)   PRAPARE - Transportation   . Lack of Transportation (Medical): No   . Lack of Transportation (Non-Medical): No  Physical Activity: Insufficiently Active (06/10/2022)   Exercise Vital Sign   . Days of Exercise per Week: 2 days   . Minutes of Exercise per Session: 20 min  Stress: Stress Concern Present (06/10/2022)   [employer] of Occupational Health - Occupational Stress Questionnaire   . Feeling of Stress : To some extent  Social Connections: Moderately Integrated (06/10/2022)   Social Connection and Isolation Panel [NHANES]   . Frequency of Communication with Friends and Family: More than three times a week   . Frequency of Social Gatherings with Friends and Family: Once a week   . Attends Religious Services: More than 4 times per year   . Active Member of Clubs or Organizations: No   . Attends Banker Meetings: Not on file   . Marital Status: Married   Family History  Problem Relation Age of Onset  . Heart attack Father   . Heart attack Maternal Grandmother   . Heart attack Maternal Grandfather   . Colon cancer Neg Hx   . Colon polyps Neg Hx    No Known Allergies Current Outpatient Medications  Medication Sig Dispense Refill  . atorvastatin (LIPITOR) 80 MG tablet Take 1 tablet (80 mg total) by mouth daily. 90  tablet 3  . azelastine (ASTELIN) 0.1 % nasal spray Place 2 sprays into both nostrils 2 (two) times daily. 1 mL 2  . cetirizine (ZYRTEC) 10 MG tablet Take 10 mg by mouth daily.    . citalopram (CELEXA) 20 MG tablet TAKE 1 TABLET BY MOUTH EVERY DAY 90 tablet 2  . cyclobenzaprine (FLEXERIL) 5 MG tablet Take 1 tablet (5 mg total) by mouth 3 (three) times daily as needed for muscle spasms. 30 tablet 1  . dextromethorphan 15 MG/5ML syrup Take 10 mLs (30 mg total) by mouth 4 (four) times daily as needed for cough. 120 mL 0  . docusate sodium (COLACE) 100 MG capsule Take 100 mg by mouth at bedtime.    . hydrOXYzine (VISTARIL) 25 MG capsule Take  1 capsule (25 mg total) by mouth every 8 (eight) hours as needed. 30 capsule 0  . Melatonin 10 MG TABS Take 20 mg by mouth at bedtime.    . nitroGLYCERIN (NITROSTAT) 0.4 MG SL tablet Place 1 tablet (0.4 mg total) under the tongue every 5 (five) minutes as needed for chest pain. 25 tablet 3  . ondansetron (ZOFRAN) 4 MG tablet Take 1 tablet (4 mg total) by mouth every 6 (six) hours as needed for nausea or vomiting. 20 tablet 0  . oxyCODONE-acetaminophen (PERCOCET/ROXICET) 5-325 MG tablet Take 1 tablet by mouth every 4 (four) hours as needed for severe pain. 10 tablet 0  . predniSONE (DELTASONE) 20 MG tablet Take 2 tablets (40 mg total) by mouth daily with breakfast. 10 tablet 0  . Probiotic Product (PROBIOTIC PO) Take 1 capsule by mouth daily.    . ticagrelor (BRILINTA) 90 MG TABS tablet Take 1 tablet (90 mg total) by mouth 2 (two) times daily. 180 tablet 3  . triamcinolone cream (KENALOG) 0.1 % Apply 1 Application topically 2 (two) times daily. 80 g 0   No current facility-administered medications for this visit.   No results found.  Review of Systems:   A ROS was performed including pertinent positives and negatives as documented in the HPI.  Physical Exam :   Constitutional: NAD and appears stated age Neurological: Alert and oriented Psych: Appropriate  affect and cooperative There were no vitals taken for this visit.   Comprehensive Musculoskeletal Exam:    Tenderness palpation over the A1 pulley of the right ring finger as well as the left index finger, frank triggering of the right index finger.  Otherwise full composite fist  Imaging:   Xray (3 views right hand, 3 views left hand): Normal   I personally reviewed and interpreted the radiographs.   Assessment:   52 y.o. male presents with right ring finger and left index finger trigger fingers.  At today's visit I did discuss that I would ultimately recommend an initial ultrasound-guided injection.  He would like to proceed with this.  Plan :    -Right ring finger as well as left index finger trigger finger injections provided after verbal consent obtained    Procedure Note  Patient: Derrick Turner             Date of Birth: 04-12-1970           MRN: [id number]             Visit Date: 09/23/2022  Procedures: Visit Diagnoses:  1. Pain in left hand   2. Pain in right hand     Hand/UE Inj: R ring A1 for trigger finger on 09/23/2022 3:20 PM   Hand/UE Inj: L index A1 for trigger finger on 09/23/2022 3:21 PM         I personally saw and evaluated the patient, and participated in the management and treatment plan.  Huel Cote, MD Attending Physician, Orthopedic Surgery  This document was dictated using Dragon voice recognition software. A reasonable attempt at proof reading has been made to minimize errors.

## 2022-09-30 ENCOUNTER — Ambulatory Visit: Payer: Managed Care, Other (non HMO) | Admitting: Family Medicine

## 2022-09-30 ENCOUNTER — Encounter: Payer: Self-pay | Admitting: Professional Counselor

## 2022-09-30 ENCOUNTER — Ambulatory Visit: Payer: Self-pay | Admitting: Professional Counselor

## 2022-10-13 ENCOUNTER — Other Ambulatory Visit (HOSPITAL_BASED_OUTPATIENT_CLINIC_OR_DEPARTMENT_OTHER): Payer: Self-pay | Admitting: Orthopaedic Surgery

## 2022-10-13 ENCOUNTER — Encounter (HOSPITAL_BASED_OUTPATIENT_CLINIC_OR_DEPARTMENT_OTHER): Payer: Self-pay | Admitting: Orthopaedic Surgery

## 2022-10-13 DIAGNOSIS — M79642 Pain in left hand: Secondary | ICD-10-CM

## 2022-10-26 ENCOUNTER — Ambulatory Visit: Payer: Managed Care, Other (non HMO) | Admitting: Family Medicine

## 2022-10-26 ENCOUNTER — Encounter: Payer: Self-pay | Admitting: Family Medicine

## 2022-10-26 VITALS — BP 139/83 | HR 84 | Ht 70.0 in | Wt 221.1 lb

## 2022-10-26 DIAGNOSIS — M2550 Pain in unspecified joint: Secondary | ICD-10-CM

## 2022-10-26 DIAGNOSIS — M7918 Myalgia, other site: Secondary | ICD-10-CM

## 2022-10-26 MED ORDER — METHYLPREDNISOLONE 4 MG PO TBPK
ORAL_TABLET | ORAL | 0 refills | Status: AC
Start: 2022-10-26 — End: ?

## 2022-10-26 NOTE — Progress Notes (Signed)
Established Patient Office Visit  Subjective:  Patient ID: Derrick Turner, male    DOB: [birth year]  Age: 52 y.o. MRN: [id number]  CC:  Chief Complaint  Patient presents with   Care Management    Pt reports hand, back and shoulder pain, also some hip pain. Shoulder pain ongoing for years, back and hip pain started last month, all together having trouble walking. Will be going to see a hand specialist next month.     HPI Derrick Turner is a 52 y.o. male presents with complaints of musculoskeletal pain.  Musculoskeletal Pain: The patient is currently under the care of orthopedic surgery and reports right lower lumbar pain radiating to his hips and lower extremities. The pain is described as dull and achy, becoming sharp when reaching toward his lower extremity. Pain is rated 7 out of 10 in the clinic, worsens with activity, and is relieved with rest. The patient also reports a history of right shoulder surgery in 2012 and left shoulder surgery in 2015. 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 He denies numbness, tingling, or weakness in the lower extremities, hands, shoulder and reports no recent injury or trauma. The patient also complains of right hand pain and will be following up with a hand specialist, as referred by his orthopedic surgeon.  Past Medical History:  Diagnosis Date   Coronary artery disease cardiologist--- dr croitoru   ETT 04-01-2016 in epic, reproduced chest pain but no EKG changes;  cardiac cath 04-06-2016 moderate diffuse nonobstructive disease, aggressive medical management   History of 2019 novel coronavirus disease (COVID-19) 03/02/2020   per pt tested positive covid, copy of result in epic, stated mild symptoms that resolved   History of kidney stones    Incomplete right bundle branch block    Right ureteral calculus     Wears glasses     Past Surgical History:  Procedure Laterality Date   APPENDECTOMY  teen   BACK SURGERY     two   BIOPSY  08/11/2020   Procedure: BIOPSY;  Surgeon: Lanelle Bal, DO;  [facility];  Service: Endoscopy;;   COLONOSCOPY WITH PROPOFOL N/A 08/11/2020   Procedure: COLONOSCOPY WITH PROPOFOL;  Surgeon: Lanelle Bal, DO;  [facility];  Service: Endoscopy;  Laterality: N/A;  7:30am   CORONARY/GRAFT ACUTE MI REVASCULARIZATION N/A 07/16/2021   Procedure: Coronary/Graft Acute MI Revascularization;  Surgeon: Kathleene Hazel, MD;  [facility];  Service: Cardiovascular;  Laterality: N/A;   CYSTOSCOPY WITH RETROGRADE PYELOGRAM, URETEROSCOPY AND STENT PLACEMENT Right 04/14/2020   Procedure: CYSTOSCOPY WITH RETROGRADE PYELOGRAM, URETEROSCOPY AND STENT PLACEMENT;  Surgeon: Belva Agee, MD;  Location: [facility];  Service: Urology;  Laterality: Right;   EXTRACORPOREAL SHOCK WAVE LITHOTRIPSY Right 03/23/2020   Procedure: EXTRACORPOREAL SHOCK WAVE LITHOTRIPSY (ESWL);  Surgeon: Crista Elliot, MD;  Location: [facility];  Service: Urology;  Laterality: Right;   HOLMIUM LASER APPLICATION Right 04/14/2020   Procedure: HOLMIUM LASER APPLICATION;  Surgeon: Belva Agee, MD;  Location: [facility];  Service: Urology;  Laterality: Right;   LAPAROSCOPIC CHOLECYSTECTOMY  2012 approx   LEFT HEART CATH AND CORONARY ANGIOGRAPHY N/A 04/06/2016   Procedure: Left Heart Cath and Coronary Angiography;  Surgeon: Tonny Bollman, MD;  Location: [facility] CV [facility];  Service: Cardiovascular;  Laterality:  N/A;   LEFT HEART CATH AND CORONARY ANGIOGRAPHY N/A 07/16/2021   Procedure: LEFT HEART CATH AND CORONARY ANGIOGRAPHY;  Surgeon: Kathleene Hazel, MD;  Location: [facility];  Service: Cardiovascular;  Laterality: N/A;   ORCHIECTOMY Right 1990s   per pt benign cyst   PARTIAL COLECTOMY Right  09/02/2020   Procedure: HEMICOLECTOMY;  Surgeon: Franky Macho, MD;  Location: [facility];  Service: General;  Laterality: Right;   POLYPECTOMY  08/11/2020   Procedure: POLYPECTOMY;  Surgeon: Lanelle Bal, DO;  [facility];  Service: Endoscopy;;   SHOULDER SURGERY Bilateral left 2019;  right 2016   SHOULDER SURGERY Left    SHOULDER SURGERY Right     Family History  Problem Relation Age of Onset   Heart attack Father    Heart attack Maternal Grandmother    Heart attack Maternal Grandfather    Colon cancer Neg Hx    Colon polyps Neg Hx     Social History   Socioeconomic History   Marital status: Married    Spouse name: Not on file   Number of children: Not on file   Years of education: Not on file   Highest education level: GED or equivalent  Occupational History   Not on file  Tobacco Use   Smoking status: Every Day    Current packs/day: 1.50    Average packs/day: 1.5 packs/day for 35.0 years (52.5 ttl pk-yrs)    Types: Cigarettes   Smokeless tobacco: Never  Vaping Use   Vaping status: Never Used  Substance and Sexual Activity   Alcohol use: Yes    Comment: occasional   Drug use: Never   Sexual activity: Not on file  Other Topics Concern   Not on file  Social History Narrative   Not on file   Social Determinants of Health   Financial Resource Strain: Low Risk  (06/10/2022)   Overall Financial Resource Strain (CARDIA)    Difficulty of Paying Living Expenses: Not hard at all  Food Insecurity: No Food Insecurity (06/10/2022)   Hunger Vital Sign    Worried About Running Out of Food in the Last Year: Never true    Ran Out of Food in the Last Year: Never true  Transportation Needs: No Transportation Needs (06/10/2022)   PRAPARE - Administrator, Civil Service (Medical): No    Lack of Transportation (Non-Medical): No  Physical Activity: Insufficiently Active (06/10/2022)   Exercise Vital Sign    Days of Exercise per Week: 2 days    Minutes of  Exercise per Session: 20 min  Stress: Stress Concern Present (06/10/2022)   [employer] of Occupational Health - Occupational Stress Questionnaire    Feeling of Stress : To some extent  Social Connections: Moderately Integrated (06/10/2022)   Social Connection and Isolation Panel [NHANES]    Frequency of Communication with Friends and Family: More than three times a week    Frequency of Social Gatherings with Friends and Family: Once a week    Attends Religious Services: More than 4 times per year    Active Member of [employer] or Organizations: No    Attends Banker Meetings: Not on file    Marital Status: Married  Intimate Partner Violence: Unknown (06/07/2022)   Received from [employer]   HITS    Physically Hurt: Not on file    Insult or Talk Down To: Not on file    Threaten Physical Harm: Not on file  Scream or Curse: Not on file    Outpatient Medications Prior to Visit  Medication Sig Dispense Refill   atorvastatin (LIPITOR) 80 MG tablet Take 1 tablet (80 mg total) by mouth daily. 90 tablet 3   azelastine (ASTELIN) 0.1 % nasal spray Place 2 sprays into both nostrils 2 (two) times daily. 1 mL 2   cetirizine (ZYRTEC) 10 MG tablet Take 10 mg by mouth daily.     citalopram (CELEXA) 20 MG tablet TAKE 1 TABLET BY MOUTH EVERY DAY 90 tablet 2   cyclobenzaprine (FLEXERIL) 5 MG tablet Take 1 tablet (5 mg total) by mouth 3 (three) times daily as needed for muscle spasms. 30 tablet 1   docusate sodium (COLACE) 100 MG capsule Take 100 mg by mouth at bedtime.     hydrOXYzine (VISTARIL) 25 MG capsule Take 1 capsule (25 mg total) by mouth every 8 (eight) hours as needed. 30 capsule 0   predniSONE (DELTASONE) 20 MG tablet Take 2 tablets (40 mg total) by mouth daily with breakfast. 10 tablet 0   Probiotic Product (PROBIOTIC PO) Take 1 capsule by mouth daily.     ticagrelor (BRILINTA) 90 MG TABS tablet Take 1 tablet (90 mg total) by mouth 2 (two) times daily. 180 tablet  3   triamcinolone cream (KENALOG) 0.1 % Apply 1 Application topically 2 (two) times daily. 80 g 0   dextromethorphan 15 MG/5ML syrup Take 10 mLs (30 mg total) by mouth 4 (four) times daily as needed for cough. 120 mL 0   Melatonin 10 MG TABS Take 20 mg by mouth at bedtime.     nitroGLYCERIN (NITROSTAT) 0.4 MG SL tablet Place 1 tablet (0.4 mg total) under the tongue every 5 (five) minutes as needed for chest pain. 25 tablet 3   ondansetron (ZOFRAN) 4 MG tablet Take 1 tablet (4 mg total) by mouth every 6 (six) hours as needed for nausea or vomiting. 20 tablet 0   oxyCODONE-acetaminophen (PERCOCET/ROXICET) 5-325 MG tablet Take 1 tablet by mouth every 4 (four) hours as needed for severe pain. 10 tablet 0   No facility-administered medications prior to visit.    No Known Allergies  ROS Review of Systems  Constitutional:  Negative for fatigue and fever.  Eyes:  Negative for visual disturbance.  Respiratory:  Negative for chest tightness and shortness of breath.   Cardiovascular:  Negative for chest pain and palpitations.  Musculoskeletal:  Positive for arthralgias and back pain.  Neurological:  Negative for dizziness and headaches.      Objective:    Physical Exam HENT:     Head: Normocephalic.     Right Ear: External ear normal.     Left Ear: External ear normal.     Nose: No congestion or rhinorrhea.     Mouth/Throat:     Mouth: Mucous membranes are moist.  Cardiovascular:     Rate and Rhythm: Regular rhythm.     Heart sounds: No murmur heard. Pulmonary:     Effort: No respiratory distress.     Breath sounds: Normal breath sounds.  Neurological:     Mental Status: He is alert.     BP 139/83   Pulse 84   Ht 5\' 10"  (1.778 m)   Wt 221 lb 1.3 oz (100.3 kg)   SpO2 96%   BMI 31.72 kg/m  Wt Readings from Last 3 Encounters:  10/26/22 221 lb 1.3 oz (100.3 kg)  06/10/22 217 lb (98.4 kg)  04/05/22 222 lb (100.7 kg)  Lab Results  Component Value Date   TSH 1.220  03/11/2022   Lab Results  Component Value Date   WBC 7.5 03/11/2022   HGB 15.8 03/11/2022   HCT 48.7 03/11/2022   MCV 90 03/11/2022   PLT 166 03/11/2022   Lab Results  Component Value Date   NA 145 (H) 03/11/2022   K 5.2 03/11/2022   CO2 25 03/11/2022   GLUCOSE 107 (H) 03/11/2022   BUN 11 03/11/2022   CREATININE 0.77 03/11/2022   BILITOT 0.5 03/11/2022   ALKPHOS 89 03/11/2022   AST 16 03/11/2022   ALT 16 03/11/2022   PROT 6.8 03/11/2022   ALBUMIN 4.6 03/11/2022   CALCIUM 9.2 03/11/2022   ANIONGAP 14 07/27/2021   EGFR 107 03/11/2022   Lab Results  Component Value Date   CHOL 82 (L) 03/11/2022   Lab Results  Component Value Date   HDL 42 03/11/2022   Lab Results  Component Value Date   LDLCALC 27 03/11/2022   Lab Results  Component Value Date   TRIG 50 03/11/2022   Lab Results  Component Value Date   CHOLHDL 2.0 03/11/2022   Lab Results  Component Value Date   HGBA1C 6.1 (H) 03/11/2022      Assessment & Plan:  Arthralgia, unspecified joint Assessment & Plan: -I recommend starting the Medrol Dosepak and following the instructions as prescribed. -Please avoid ibuprofen products while on this treatment regimen, as they can increase the risk of gastrointestinal ulcers and bleeding. -Take Tylenol as needed for pain relief. -Apply heat therapy to the affected area for 15 to 20 minutes, 3-4 times daily. -Perform stretching and strengthening exercises to aid in recovery. -Avoid repetitive motions and activities that aggravate your symptoms. -Please follow up with orthopedic surgery if your symptoms do not improve with the current treatment regimen.  Orders: -     methylPREDNISolone; Take as the package instructed  Dispense: 1 each; Refill: 0  Note: This chart has been completed using Engineer, civil (consulting) software, and while attempts have been made to ensure accuracy, certain words and phrases may not be transcribed as intended.    Follow-up: No  follow-ups on file.   Gilmore Laroche, FNP

## 2022-10-26 NOTE — Patient Instructions (Addendum)
I appreciate the opportunity to provide care to you today!    Follow up:  4 months   Shoulder Pain and Sciatica of the Lumbar Spine: -I recommend starting the Medrol Dosepak and following the instructions as prescribed. -Please avoid ibuprofen products while on this treatment regimen, as they can increase the risk of gastrointestinal ulcers and bleeding. -Take Tylenol as needed for pain relief. -Apply heat therapy to the affected area for 15 to 20 minutes, 3-4 times daily. -Perform stretching and strengthening exercises to aid in recovery. -Avoid repetitive motions and activities that aggravate your symptoms. -Please follow up with orthopedic surgery if your symptoms do not improve with the current treatment regimen.   Attached with your AVS, you will find valuable resources for self-education. I highly recommend dedicating some time to thoroughly examine them.   Please continue to a heart-healthy diet and increase your physical activities. Try to exercise for at least five days a week.    It was a pleasure to see you and I look forward to continuing to work together on your health and well-being. Please do not hesitate to call the office if you need care or have questions about your care.  In case of emergency, please visit the Emergency Department for urgent care, or contact our clinic at [phone] to schedule an appointment. We're here to help you!   Have a wonderful day and week. With Gratitude, Gilmore Laroche MSN, FNP-BC

## 2022-10-28 DIAGNOSIS — M2550 Pain in unspecified joint: Secondary | ICD-10-CM | POA: Insufficient documentation

## 2022-10-28 NOTE — Assessment & Plan Note (Signed)
-  I recommend starting the Medrol Dosepak and following the instructions as prescribed. -Please avoid ibuprofen products while on this treatment regimen, as they can increase the risk of gastrointestinal ulcers and bleeding. -Take Tylenol as needed for pain relief. -Apply heat therapy to the affected area for 15 to 20 minutes, 3-4 times daily. -Perform stretching and strengthening exercises to aid in recovery. -Avoid repetitive motions and activities that aggravate your symptoms. -Please follow up with orthopedic surgery if your symptoms do not improve with the current treatment regimen.

## 2022-10-31 ENCOUNTER — Encounter: Payer: Self-pay | Admitting: Family Medicine

## 2022-11-01 ENCOUNTER — Ambulatory Visit: Payer: Managed Care, Other (non HMO) | Admitting: Family Medicine

## 2022-11-01 ENCOUNTER — Encounter: Payer: Self-pay | Admitting: Family Medicine

## 2022-11-01 VITALS — BP 119/67 | HR 76 | Ht 70.0 in | Wt 222.0 lb

## 2022-11-01 DIAGNOSIS — F419 Anxiety disorder, unspecified: Secondary | ICD-10-CM | POA: Diagnosis not present

## 2022-11-01 DIAGNOSIS — R310 Gross hematuria: Secondary | ICD-10-CM

## 2022-11-01 MED ORDER — HYDROXYZINE PAMOATE 25 MG PO CAPS
25.0000 mg | ORAL_CAPSULE | Freq: Three times a day (TID) | ORAL | 0 refills | Status: AC | PRN
Start: 2022-11-01 — End: ?

## 2022-11-01 NOTE — Assessment & Plan Note (Signed)
A referral has been placed to urology for further evaluation of gross hematuria. Today, we will assess a complete blood count (CBC), prostate-specific antigen (PSA), basic metabolic panel (BMP), PT/INR, PTT, and urinalysis.  The patient is encouraged to report any symptoms of lightheadedness, dizziness, or fatigue. Additionally, she should follow up if she experiences recurrent hematuria accompanied by pain, weight loss, fever, or difficulty urinating.

## 2022-11-01 NOTE — Progress Notes (Signed)
Established Patient Office Visit  Subjective:  Patient ID: Derrick Turner, male    DOB: Mar 04, 1970  Age: 52 y.o. MRN: [id number]  CC:  Chief Complaint  Patient presents with   Hematuria    Pt reports blood cleared yesterday morning, urine is now clear.    HPI Derrick Turner is a 52 y.o. male  with complaints of painless visible hematuria that started 3 days ago. He reports that his symptoms were worse during the first 2 days but have since subsided. He denies experiencing urgency, frequency, or pain with urination. The patient also reports no fever, chills, or unintentional weight loss. Additionally, he has no history of benign prostatic hyperplasia (BPH).  Past Medical History:  Diagnosis Date   Coronary artery disease cardiologist--- dr croitoru   ETT 04-01-2016 in epic, reproduced chest pain but no EKG changes;  cardiac cath 04-06-2016 moderate diffuse nonobstructive disease, aggressive medical management   History of 2019 novel coronavirus disease (COVID-19) 03/02/2020   per pt tested positive covid, copy of result in epic, stated mild symptoms that resolved   History of kidney stones    Incomplete right bundle branch block    Right ureteral calculus    Wears glasses     Past Surgical History:  Procedure Laterality Date   APPENDECTOMY  teen   BACK SURGERY     two   BIOPSY  08/11/2020   Procedure: BIOPSY;  Surgeon: Lanelle Bal, DO;  [facility];  Service: Endoscopy;;   COLONOSCOPY WITH PROPOFOL N/A 08/11/2020   Procedure: COLONOSCOPY WITH PROPOFOL;  Surgeon: Lanelle Bal, DO;  [facility];  Service: Endoscopy;  Laterality: N/A;  7:30am   CORONARY/GRAFT ACUTE MI REVASCULARIZATION N/A 07/16/2021   Procedure: Coronary/Graft Acute MI Revascularization;  Surgeon: Kathleene Hazel, MD;  [facility];  Service: Cardiovascular;  Laterality: N/A;   CYSTOSCOPY WITH RETROGRADE PYELOGRAM, URETEROSCOPY AND STENT PLACEMENT Right  04/14/2020   Procedure: CYSTOSCOPY WITH RETROGRADE PYELOGRAM, URETEROSCOPY AND STENT PLACEMENT;  Surgeon: Belva Agee, MD;  Location: [facility];  Service: Urology;  Laterality: Right;   EXTRACORPOREAL SHOCK WAVE LITHOTRIPSY Right 03/23/2020   Procedure: EXTRACORPOREAL SHOCK WAVE LITHOTRIPSY (ESWL);  Surgeon: Crista Elliot, MD;  Location: [facility];  Service: Urology;  Laterality: Right;   HOLMIUM LASER APPLICATION Right 04/14/2020   Procedure: HOLMIUM LASER APPLICATION;  Surgeon: Belva Agee, MD;  Location: [facility];  Service: Urology;  Laterality: Right;   LAPAROSCOPIC CHOLECYSTECTOMY  2012 approx   LEFT HEART CATH AND CORONARY ANGIOGRAPHY N/A 04/06/2016   Procedure: Left Heart Cath and Coronary Angiography;  Surgeon: Tonny Bollman, MD;  Location: [facility];  Service: Cardiovascular;  Laterality: N/A;   LEFT HEART CATH AND CORONARY ANGIOGRAPHY N/A 07/16/2021   Procedure: LEFT HEART CATH AND CORONARY ANGIOGRAPHY;  Surgeon: Kathleene Hazel, MD;  [facility];  Service: Cardiovascular;  Laterality: N/A;   ORCHIECTOMY Right 1990s   per pt benign cyst   PARTIAL COLECTOMY Right 09/02/2020   Procedure: HEMICOLECTOMY;  Surgeon: Franky Macho, MD;  [facility]: [facility];  Service: General;  Laterality: Right;   POLYPECTOMY  08/11/2020   Procedure: POLYPECTOMY;  Surgeon: Lanelle Bal, DO;  [facility];  Service: Endoscopy;;   SHOULDER SURGERY Bilateral left 2019;  right 2016   SHOULDER SURGERY Left    SHOULDER SURGERY Right     Family History  Problem  Relation Age of Onset   Heart attack Father    Heart attack Maternal Grandmother    Heart attack Maternal Grandfather    Colon cancer Neg Hx    Colon polyps Neg Hx     Social History   Socioeconomic History   Marital status: Married    Spouse name: Not on file   Number of children: Not on file   Years of education: Not on file    Highest education level: GED or equivalent  Occupational History   Not on file  Tobacco Use   Smoking status: Every Day    Current packs/day: 1.50    Average packs/day: 1.5 packs/day for 35.0 years (52.5 ttl pk-yrs)    Types: Cigarettes   Smokeless tobacco: Never  Vaping Use   Vaping status: Never Used  Substance and Sexual Activity   Alcohol use: Yes    Comment: occasional   Drug use: Never   Sexual activity: Not on file  Other Topics Concern   Not on file  Social History Narrative   Not on file   Social Determinants of Health   Financial Resource Strain: Low Risk  (06/10/2022)   Overall Financial Resource Strain (CARDIA)    Difficulty of Paying Living Expenses: Not hard at all  Food Insecurity: No Food Insecurity (06/10/2022)   Hunger Vital Sign    Worried About Running Out of Food in the Last Year: Never true    Ran Out of Food in the Last Year: Never true  Transportation Needs: No Transportation Needs (06/10/2022)   PRAPARE - Administrator, Civil Service (Medical): No    Lack of Transportation (Non-Medical): No  Physical Activity: Insufficiently Active (06/10/2022)   Exercise Vital Sign    Days of Exercise per Week: 2 days    Minutes of Exercise per Session: 20 min  Stress: Stress Concern Present (06/10/2022)   [employer] of Occupational Health - Occupational Stress Questionnaire    Feeling of Stress : To some extent  Social Connections: Moderately Integrated (06/10/2022)   Social Connection and Isolation Panel [NHANES]    Frequency of Communication with Friends and Family: More than three times a week    Frequency of Social Gatherings with Friends and Family: Once a week    Attends Religious Services: More than 4 times per year    Active Member of [employer] or Organizations: No    Attends Engineer, structural: Not on file    Marital Status: Married  Intimate Partner Violence: Unknown (06/07/2022)   Received from [employer], [facility]   HITS     Physically Hurt: Not on file    Insult or Talk Down To: Not on file    Threaten Physical Harm: Not on file    Scream or Curse: Not on file    Outpatient Medications Prior to Visit  Medication Sig Dispense Refill   atorvastatin (LIPITOR) 80 MG tablet Take 1 tablet (80 mg total) by mouth daily. 90 tablet 3   azelastine (ASTELIN) 0.1 % nasal spray Place 2 sprays into both nostrils 2 (two) times daily. 1 mL 2   cetirizine (ZYRTEC) 10 MG tablet Take 10 mg by mouth daily.     citalopram (CELEXA) 20 MG tablet TAKE 1 TABLET BY MOUTH EVERY DAY 90 tablet 2   cyclobenzaprine (FLEXERIL) 5 MG tablet Take 1 tablet (5 mg total) by mouth 3 (three) times daily as needed for muscle spasms. 30 tablet 1   docusate sodium (COLACE)  100 MG capsule Take 100 mg by mouth at bedtime.     methylPREDNISolone (MEDROL DOSEPAK) 4 MG TBPK tablet Take as the package instructed 1 each 0   predniSONE (DELTASONE) 20 MG tablet Take 2 tablets (40 mg total) by mouth daily with breakfast. 10 tablet 0   Probiotic Product (PROBIOTIC PO) Take 1 capsule by mouth daily.     ticagrelor (BRILINTA) 90 MG TABS tablet Take 1 tablet (90 mg total) by mouth 2 (two) times daily. 180 tablet 3   triamcinolone cream (KENALOG) 0.1 % Apply 1 Application topically 2 (two) times daily. 80 g 0   hydrOXYzine (VISTARIL) 25 MG capsule Take 1 capsule (25 mg total) by mouth every 8 (eight) hours as needed. 30 capsule 0   dextromethorphan 15 MG/5ML syrup Take 10 mLs (30 mg total) by mouth 4 (four) times daily as needed for cough. 120 mL 0   Melatonin 10 MG TABS Take 20 mg by mouth at bedtime.     nitroGLYCERIN (NITROSTAT) 0.4 MG SL tablet Place 1 tablet (0.4 mg total) under the tongue every 5 (five) minutes as needed for chest pain. 25 tablet 3   ondansetron (ZOFRAN) 4 MG tablet Take 1 tablet (4 mg total) by mouth every 6 (six) hours as needed for nausea or vomiting. 20 tablet 0   oxyCODONE-acetaminophen (PERCOCET/ROXICET) 5-325 MG tablet Take 1 tablet by  mouth every 4 (four) hours as needed for severe pain. 10 tablet 0   No facility-administered medications prior to visit.    No Known Allergies  ROS Review of Systems  Constitutional:  Negative for fatigue and fever.  Eyes:  Negative for visual disturbance.  Respiratory:  Negative for chest tightness and shortness of breath.   Cardiovascular:  Negative for chest pain and palpitations.  Genitourinary:  Positive for hematuria.  Neurological:  Negative for dizziness and headaches.      Objective:    Physical Exam HENT:     Head: Normocephalic.     Right Ear: External ear normal.     Left Ear: External ear normal.     Nose: No congestion or rhinorrhea.     Mouth/Throat:     Mouth: Mucous membranes are moist.  Cardiovascular:     Rate and Rhythm: Regular rhythm.     Heart sounds: No murmur heard. Pulmonary:     Effort: No respiratory distress.     Breath sounds: Normal breath sounds.  Neurological:     Mental Status: He is alert.     BP 119/67   Pulse 76   Ht 5\' 10"  (1.778 m)   Wt 222 lb (100.7 kg)   SpO2 95%   BMI 31.85 kg/m  Wt Readings from Last 3 Encounters:  11/01/22 222 lb (100.7 kg)  10/26/22 221 lb 1.3 oz (100.3 kg)  06/10/22 217 lb (98.4 kg)    Lab Results  Component Value Date   TSH 1.220 03/11/2022   Lab Results  Component Value Date   WBC 7.5 03/11/2022   HGB 15.8 03/11/2022   HCT 48.7 03/11/2022   MCV 90 03/11/2022   PLT 166 03/11/2022   Lab Results  Component Value Date   NA 145 (H) 03/11/2022   K 5.2 03/11/2022   CO2 25 03/11/2022   GLUCOSE 107 (H) 03/11/2022   BUN 11 03/11/2022   CREATININE 0.77 03/11/2022   BILITOT 0.5 03/11/2022   ALKPHOS 89 03/11/2022   AST 16 03/11/2022   ALT 16 03/11/2022   PROT 6.8 03/11/2022  ALBUMIN 4.6 03/11/2022   CALCIUM 9.2 03/11/2022   ANIONGAP 14 07/27/2021   EGFR 107 03/11/2022   Lab Results  Component Value Date   CHOL 82 (L) 03/11/2022   Lab Results  Component Value Date   HDL 42  03/11/2022   Lab Results  Component Value Date   LDLCALC 27 03/11/2022   Lab Results  Component Value Date   TRIG 50 03/11/2022   Lab Results  Component Value Date   CHOLHDL 2.0 03/11/2022   Lab Results  Component Value Date   HGBA1C 6.1 (H) 03/11/2022      Assessment & Plan:  Hematuria, gross Assessment & Plan: A referral has been placed to urology for further evaluation of gross hematuria. Today, we will assess a complete blood count (CBC), prostate-specific antigen (PSA), basic metabolic panel (BMP), PT/INR, PTT, and urinalysis.  The patient is encouraged to report any symptoms of lightheadedness, dizziness, or fatigue. Additionally, she should follow up if she experiences recurrent hematuria accompanied by pain, weight loss, fever, or difficulty urinating.  Orders: -     Ambulatory referral to Urology -     PSA, total and free -     CBC -     Urinalysis -     PT and PTT -     BMP8+EGFR -     Protime-INR  Anxiety -     hydrOXYzine Pamoate; Take 1 capsule (25 mg total) by mouth every 8 (eight) hours as needed.  Dispense: 30 capsule; Refill: 0   Note: This chart has been completed using Engineer, civil (consulting) software, and while attempts have been made to ensure accuracy, certain words and phrases may not be transcribed as intended.   Follow-up: No follow-ups on file.   Gilmore Laroche, FNP

## 2022-11-01 NOTE — Patient Instructions (Addendum)
I appreciate the opportunity to provide care to you today!    Follow up: 02/28/2023  Labs: please stop by the lab today to get your blood drawn (CBC, BMP, PSA, INR/PT/PTT)  Referrals today- Urology  Attached with your AVS, you will find valuable resources for self-education. I highly recommend dedicating some time to thoroughly examine them.   Please continue to a heart-healthy diet and increase your physical activities. Try to exercise for at least five days a week.    It was a pleasure to see you and I look forward to continuing to work together on your health and well-being. Please do not hesitate to call the office if you need care or have questions about your care.  In case of emergency, please visit the Emergency Department for urgent care, or contact our clinic at [phone] to schedule an appointment. We're here to help you!   Have a wonderful day and week. With Gratitude, Gilmore Laroche MSN, FNP-BC

## 2022-11-01 NOTE — Addendum Note (Signed)
Addended by: Herbie Saxon on: 11/01/2022 03:31 PM   Modules accepted: Orders

## 2022-11-02 ENCOUNTER — Ambulatory Visit: Payer: Managed Care, Other (non HMO) | Admitting: Orthopedic Surgery

## 2022-11-02 DIAGNOSIS — M65341 Trigger finger, right ring finger: Secondary | ICD-10-CM | POA: Diagnosis not present

## 2022-11-02 DIAGNOSIS — M65322 Trigger finger, left index finger: Secondary | ICD-10-CM

## 2022-11-02 LAB — BMP8+EGFR
BUN/Creatinine Ratio: 21 — ABNORMAL HIGH (ref 9–20)
BUN: 17 mg/dL (ref 6–24)
CO2: 27 mmol/L (ref 20–29)
Calcium: 9.1 mg/dL (ref 8.7–10.2)
Chloride: 97 mmol/L (ref 96–106)
Creatinine, Ser: 0.8 mg/dL (ref 0.76–1.27)
Glucose: 104 mg/dL — ABNORMAL HIGH (ref 70–99)
Potassium: 4.8 mmol/L (ref 3.5–5.2)
Sodium: 137 mmol/L (ref 134–144)
eGFR: 105 mL/min/{1.73_m2} (ref >59)

## 2022-11-02 LAB — URINALYSIS
Bilirubin, UA: NEGATIVE
Glucose, UA: NEGATIVE
Ketones, UA: NEGATIVE
Leukocytes,UA: NEGATIVE
Nitrite, UA: NEGATIVE
Protein,UA: NEGATIVE
Specific Gravity, UA: 1.03 (ref 1.005–1.030)
Urobilinogen, Ur: 0.2 mg/dL (ref 0.2–1.0)
pH, UA: 5.5 (ref 5.0–7.5)

## 2022-11-02 LAB — PT AND PTT
INR: 1 (ref 0.9–1.2)
Prothrombin Time: 11.4 s (ref 9.1–12.0)
aPTT: 27 s (ref 24–33)

## 2022-11-02 LAB — CBC
Hematocrit: 47.3 % (ref 37.5–51.0)
Hemoglobin: 16.1 g/dL (ref 13.0–17.7)
MCH: 30.1 pg (ref 26.6–33.0)
MCHC: 34 g/dL (ref 31.5–35.7)
MCV: 89 fL (ref 79–97)
Platelets: 249 10*3/uL (ref 150–450)
RBC: 5.34 x10E6/uL (ref 4.14–5.80)
RDW: 13 % (ref 11.6–15.4)
WBC: 15.6 10*3/uL — ABNORMAL HIGH (ref 3.4–10.8)

## 2022-11-02 NOTE — Progress Notes (Signed)
Derrick Turner - 52 y.o. male MRN [id number]  Date of birth: 17-Dec-1970  Office Visit Note: Visit Date: 11/02/2022 PCP: Gilmore Laroche, FNP Referred by: Huel Cote, MD  Subjective: No chief complaint on file.  HPI: Derrick Turner is a pleasant 53 y.o. male who presents today for evaluation of bilateral trigger digits, left index and right ring finger.  He was seen by Dr. Steward Drone in August of this year, underwent injections to the left index and right ring A1 pulley for symptom relief.  States that the right ring is improved moderately, still has mild clicking without pain, the left index continues to be symptomatic.  Pertinent ROS were reviewed with the patient and found to be negative unless otherwise specified above in HPI.   Visit Reason: bilateral trigger digit, left index, right ring Duration of symptoms: 3+ months Hand dominance: left Occupation:Electrician Diabetic: No Smoking: Yes Heart/Lung History: heart attack Blood Thinners: baby aspirin  Prior Testing/EMG: xrays 09/28/22 Injections (Date): right ring, left index 09/23/22 Treatments: injections, prednisone Prior Surgery:none  Assessment & Plan: Visit Diagnoses: No diagnosis found.  Plan: Extensive discussion was had the patient today regarding his bilateral trigger digits.  We reviewed the etiology and pathophysiology of stenosing tenosynovitis.  On the left index finger, given that his symptoms are refractory to conservative care in the form of medication, activity modification and prior injection, he is an appropriate candidate for left index finger trigger digit release.  The right ring is responding somewhat to the cortisone injection, we can continue to give this observation to see if symptoms continue to improve.  Should his symptoms remain refractory to conservative care of the right ring finger, we could always explore the option for trigger digit release on that side as well in the future.  Risks  and benefits of the procedure were discussed, risks including but not limited to infection, bleeding, scarring, stiffness, nerve injury, tendon injury, vascular injury, recurrence of symptoms and need for subsequent operation.  Patient expressed understanding.  Will move forward with surgical scheduling of left index trigger digit release under local anesthesia.    Follow-up: No follow-ups on file.   Meds & Orders: No orders of the defined types were placed in this encounter.  No orders of the defined types were placed in this encounter.    Procedures: No procedures performed      Clinical History: No specialty comments available.  He reports that he has been smoking cigarettes. He has a 52.5 pack-year smoking history. He has never used smokeless tobacco.  Recent Labs    03/11/22 0836  HGBA1C 6.1*    Objective:   Vital Signs: There were no vitals taken for this visit.  Physical Exam  Gen: Well-appearing, in no acute distress; non-toxic CV: Regular Rate. Well-perfused. Warm.  Resp: Breathing unlabored on room air; no wheezing. Psych: Fluid speech in conversation; appropriate affect; normal thought process  Ortho Exam Left hand: - Palpable nodule A1 pulley index finger, tender to palpation - Notable clicking with deep flexion of the index finger - No other palpable nodules, no other triggering - Sensation intact distally, hand is warm well-perfused  Right hand: - Palpable nodule ring finger A1 pulley, mild clicking with deep flexion, no active locking - Sensation intact distally, and is warm well-perfused  Imaging: No results found.  Past Medical/Family/Surgical/Social History: Medications & Allergies reviewed per EMR, new medications updated. Patient Active Problem List   Diagnosis Date Noted   Hematuria, gross 11/01/2022  Arthralgia 10/28/2022   Anxiety 06/10/2022   Generalized muscle ache 06/10/2022   Nephrolithiasis 04/05/2022   Right flank pain 03/18/2022    Allergic rhinitis 11/05/2021   Lesion of left ear 11/05/2021   Tinnitus, bilateral 08/04/2021   Atypical chest pain    Acute MI, 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HCC)    S/P partial colectomy 09/02/2020   Dysplastic colon polyp    Diverticulitis of colon without hemorrhage 06/23/2020   Abnormal CT scan, colon 06/23/2020   Hyperlipidemia LDL goal <70 05/03/2016   CAD (coronary artery disease) 05/03/2016   Tobacco abuse    Prediabetes    Gastroesophageal reflux disease    Exertional chest pain 03/31/2016   ACS (acute coronary syndrome) (HCC) 03/31/2016   Past Medical History:  Diagnosis Date   Coronary artery disease cardiologist--- dr croitoru   ETT 04-01-2016 in epic, reproduced chest pain but no EKG changes;  cardiac cath 04-06-2016 moderate diffuse nonobstructive disease, aggressive medical management   History of 2019 novel coronavirus disease (COVID-19) 03/02/2020   per pt tested positive covid, copy of result in epic, stated mild symptoms that resolved   History of kidney stones    Incomplete right bundle branch block    Right ureteral calculus    Wears glasses    Family History  Problem Relation Age of Onset   Heart attack Father    Heart attack Maternal Grandmother    Heart attack Maternal Grandfather    Colon cancer Neg Hx    Colon polyps Neg Hx    Past Surgical History:  Procedure Laterality Date   APPENDECTOMY  teen   BACK SURGERY     two   BIOPSY  08/11/2020   Procedure: BIOPSY;  Surgeon: Lanelle Bal, DO;  [facility];  Service: Endoscopy;;   COLONOSCOPY WITH PROPOFOL N/A 08/11/2020   Procedure: COLONOSCOPY WITH PROPOFOL;  Surgeon: Lanelle Bal, DO;  [facility];  Service: Endoscopy;  Laterality: N/A;  7:30am   CORONARY/GRAFT ACUTE MI REVASCULARIZATION N/A 07/16/2021   Procedure: Coronary/Graft Acute MI Revascularization;  Surgeon: Kathleene Hazel, MD;  [facility];  Service: Cardiovascular;  Laterality: N/A;   CYSTOSCOPY  WITH RETROGRADE PYELOGRAM, URETEROSCOPY AND STENT PLACEMENT Right 04/14/2020   Procedure: CYSTOSCOPY WITH RETROGRADE PYELOGRAM, URETEROSCOPY AND STENT PLACEMENT;  Surgeon: Belva Agee, MD;  Location: [facility];  Service: Urology;  Laterality: Right;   EXTRACORPOREAL SHOCK WAVE LITHOTRIPSY Right 03/23/2020   Procedure: EXTRACORPOREAL SHOCK WAVE LITHOTRIPSY (ESWL);  Surgeon: Crista Elliot, MD;  Location[facility];  Service: Urology;  Laterality: Right;   HOLMIUM LASER APPLICATION Right 04/14/2020   Procedure: HOLMIUM LASER APPLICATION;  Surgeon: Belva Agee, MD;  Location: [facility];  Service: Urology;  Laterality: Right;   LAPAROSCOPIC CHOLECYSTECTOMY  2012 approx   LEFT HEART CATH AND CORONARY ANGIOGRAPHY N/A 04/06/2016   Procedure: Left Heart Cath and Coronary Angiography;  Surgeon: Tonny Bollman, MD;  Location: [facility];  Service: Cardiovascular;  Laterality: N/A;   LEFT HEART CATH AND CORONARY ANGIOGRAPHY N/A 07/16/2021   Procedure: LEFT HEART CATH AND CORONARY ANGIOGRAPHY;  Surgeon: Kathleene Hazel, MD;  [facility];  Service: Cardiovascular;  Laterality: N/A;   ORCHIECTOMY Right 1990s   per pt benign cyst   PARTIAL COLECTOMY Right 09/02/2020   Procedure: HEMICOLECTOMY;  Surgeon: Franky Macho, MD;  Location[facility];  Service: General;  Laterality: Right;   POLYPECTOMY  08/11/2020   Procedure: POLYPECTOMY;  Surgeon: Lanelle Bal, DO;  [facility];  Service: Endoscopy;;   SHOULDER SURGERY Bilateral left 2019;  right 2016   SHOULDER SURGERY Left    SHOULDER SURGERY Right    Social History   Occupational History   Not on file  Tobacco Use   Smoking status: Every Day    Current packs/day: 1.50    Average packs/day: 1.5 packs/day for 35.0 years (52.5 ttl pk-yrs)    Types: Cigarettes   Smokeless tobacco: Never  Vaping Use   Vaping status: Never Used  Substance and Sexual  Activity   Alcohol use: Yes    Comment: occasional   Drug use: Never   Sexual activity: Not on file    Jamesrobert Ohanesian Trevor Mace, M.D. [facility] 9:36 AM

## 2022-11-04 ENCOUNTER — Encounter: Payer: Self-pay | Admitting: Urology

## 2022-11-04 ENCOUNTER — Ambulatory Visit: Payer: Managed Care, Other (non HMO) | Admitting: Urology

## 2022-11-04 VITALS — BP 129/86 | HR 76 | Temp 98.0°F

## 2022-11-04 DIAGNOSIS — Z720 Tobacco use: Secondary | ICD-10-CM

## 2022-11-04 DIAGNOSIS — Z8042 Family history of malignant neoplasm of prostate: Secondary | ICD-10-CM | POA: Insufficient documentation

## 2022-11-04 DIAGNOSIS — R310 Gross hematuria: Secondary | ICD-10-CM

## 2022-11-04 DIAGNOSIS — N200 Calculus of kidney: Secondary | ICD-10-CM

## 2022-11-04 LAB — MICROSCOPIC EXAMINATION: Bacteria, UA: NONE SEEN

## 2022-11-04 LAB — URINALYSIS, ROUTINE W REFLEX MICROSCOPIC
Bilirubin, UA: NEGATIVE
Glucose, UA: NEGATIVE
Ketones, UA: NEGATIVE
Nitrite, UA: NEGATIVE
Protein,UA: NEGATIVE
Specific Gravity, UA: 1.02 (ref 1.005–1.030)
Urobilinogen, Ur: 0.2 mg/dL (ref 0.2–1.0)
pH, UA: 7 (ref 5.0–7.5)

## 2022-11-05 ENCOUNTER — Other Ambulatory Visit: Payer: Self-pay | Admitting: Family Medicine

## 2022-11-05 DIAGNOSIS — F419 Anxiety disorder, unspecified: Secondary | ICD-10-CM

## 2022-11-05 LAB — PSA: Prostate Specific Ag, Serum: 1 ng/mL (ref 0.0–4.0)

## 2022-11-08 ENCOUNTER — Encounter: Payer: Self-pay | Admitting: Cardiovascular Disease

## 2022-11-08 ENCOUNTER — Ambulatory Visit: Payer: Managed Care, Other (non HMO) | Attending: Cardiovascular Disease | Admitting: Cardiovascular Disease

## 2022-11-08 VITALS — BP 111/88 | HR 74 | Ht 70.0 in | Wt 227.0 lb

## 2022-11-08 DIAGNOSIS — I2510 Atherosclerotic heart disease of native coronary artery without angina pectoris: Secondary | ICD-10-CM | POA: Diagnosis not present

## 2022-11-08 DIAGNOSIS — F17200 Nicotine dependence, unspecified, uncomplicated: Secondary | ICD-10-CM | POA: Diagnosis not present

## 2022-11-08 DIAGNOSIS — E785 Hyperlipidemia, unspecified: Secondary | ICD-10-CM

## 2022-11-08 DIAGNOSIS — E669 Obesity, unspecified: Secondary | ICD-10-CM | POA: Diagnosis not present

## 2022-11-08 LAB — CYTOLOGY, URINE

## 2022-11-08 NOTE — Patient Instructions (Signed)

## 2022-11-08 NOTE — Progress Notes (Signed)
Cardiology Office Note:    Date:  11/08/2022   ID:  Derrick Turner, DOB 1970-07-27, MRN [id number]  PCP:  Derrick Laroche, FNP  Cardiologist:  Derrick Fair, MD    Referring MD: Derrick Laroche, FNP   No chief complaint on file.   History of Present Illness:    Derrick Turner is a 52 y.o. male smoker with a hx of moderate CAD by cath in February 2018, presented with acute STEMI due to occlusion of the mid LAD artery 07/16/2021 treated with emergency PCI (SYNERGY XD 4.0X24.).  He did not have severe blockages in any other of the coronary arteries.  Post MI echocardiogram showed normal left ventricular regional wall motion and overall systolic function.    Unfortunately he continues to smoke between a quarter and a half of a pack of cigarettes daily.  At most he is managed to quit for a month, while he was taking Chantix a few years ago.  His wife also smokes.  He is on maximum dose atorvastatin and tolerating this without side effects.  To point out that even at the time of his heart attack his LDL cholesterol was very low at 26.   His father had early onset CAD as well.  He just ran out of his metoprolol 48 hours ago.  Does not feel any different when taking it over off of it.  He is taking aspirin 81 mg daily.  He has had some shoulder issues bilaterally and has a trigger finger in his left hand.  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  The patient specifically denies any chest pain at rest or with exertion, dyspnea at rest or with exertion, orthopnea, paroxysmal nocturnal dyspnea, syncope, palpitations, focal neurological deficits, intermittent claudication, lower extremity edema, unexplained weight gain, cough, hemoptysis.  He does have occasional wheezing, he thinks he is allergic to something at his workplace.    Past Medical History:  Diagnosis Date   Coronary artery disease cardiologist--- dr Litisha Guagliardo   ETT 04-01-2016 in epic, reproduced chest pain but no EKG changes;   cardiac cath 04-06-2016 moderate diffuse nonobstructive disease, aggressive medical management   History of 2019 novel coronavirus disease (COVID-19) 03/02/2020   per pt tested positive covid, copy of result in epic, stated mild symptoms that resolved   History of kidney stones    Incomplete right bundle branch block    Right ureteral calculus    Wears glasses     Past Surgical History:  Procedure Laterality Date   APPENDECTOMY  teen   BACK SURGERY     two   BIOPSY  08/11/2020   Procedure: BIOPSY;  Surgeon: Lanelle Bal, DO;  [facility];  Service: Endoscopy;;   COLONOSCOPY WITH PROPOFOL N/A 08/11/2020   Procedure: COLONOSCOPY WITH PROPOFOL;  Surgeon: Lanelle Bal, DO;  [facility];  Service: Endoscopy;  Laterality: N/A;  7:30am   CORONARY/GRAFT ACUTE MI REVASCULARIZATION N/A 07/16/2021   Procedure: Coronary/Graft Acute MI Revascularization;  Surgeon: Kathleene Hazel, MD;  [facility];  Service: Cardiovascular;  Laterality: N/A;   CYSTOSCOPY WITH RETROGRADE PYELOGRAM, URETEROSCOPY AND STENT PLACEMENT Right 04/14/2020   Procedure: CYSTOSCOPY WITH RETROGRADE PYELOGRAM, URETEROSCOPY AND STENT PLACEMENT;  Surgeon: Belva Agee, MD;  Location: [facility];  Service: Urology;  Laterality: Right;   EXTRACORPOREAL SHOCK WAVE LITHOTRIPSY Right 03/23/2020   Procedure: EXTRACORPOREAL SHOCK WAVE LITHOTRIPSY (ESWL);  Surgeon: Crista Elliot, MD;  Location: [facility];  Service: Urology;  Laterality: Right;   HOLMIUM LASER APPLICATION Right 04/14/2020   Procedure: HOLMIUM LASER APPLICATION;  Surgeon: Belva Agee, MD;  Location: [facility];  Service: Urology;  Laterality: Right;   LAPAROSCOPIC CHOLECYSTECTOMY  2012 approx   LEFT HEART CATH AND CORONARY ANGIOGRAPHY N/A 04/06/2016   Procedure: Left Heart Cath and Coronary Angiography;  Surgeon: Tonny Bollman, MD;  Location: [facility];  Service: Cardiovascular;  Laterality: N/A;   LEFT HEART CATH AND CORONARY ANGIOGRAPHY N/A 07/16/2021   Procedure: LEFT HEART CATH AND CORONARY ANGIOGRAPHY;  Surgeon: Kathleene Hazel, MD;  [facility];  Service: Cardiovascular;  Laterality: N/A;   ORCHIECTOMY Right 1990s   per pt benign cyst   PARTIAL COLECTOMY Right 09/02/2020   Procedure: HEMICOLECTOMY;  Surgeon: Franky Macho, MD;  Location: [facility];  Service: General;  Laterality: Right;   POLYPECTOMY  08/11/2020   Procedure: POLYPECTOMY;  Surgeon: Lanelle Bal, DO;  [facility];  Service: Endoscopy;;   SHOULDER SURGERY Bilateral left 2019;  right 2016   SHOULDER SURGERY Left    SHOULDER SURGERY Right     Current Medications: Current Meds  Medication Sig   atorvastatin (LIPITOR) 80 MG tablet Take 1 tablet (80 mg total) by mouth daily.   azelastine (ASTELIN) 0.1 % nasal spray Place 2 sprays into both nostrils 2 (two) times daily.   cetirizine (ZYRTEC) 10 MG tablet Take 10 mg by mouth daily.   citalopram (CELEXA) 20 MG tablet TAKE 1 TABLET BY MOUTH EVERY DAY   docusate sodium (COLACE) 100 MG capsule Take 100 mg by mouth at bedtime.   Probiotic Product (PROBIOTIC PO) Take 1 capsule by mouth daily.   triamcinolone cream (KENALOG) 0.1 % Apply 1 Application topically 2 (two) times daily.    He reports that he is not taking metoprolol  Allergies:   Patient has no known allergies.   Social History   Socioeconomic History   Marital status: Married    Spouse name: Not on file   Number of children: Not on file   Years of education: Not on file   Highest education level: GED or equivalent  Occupational History   Not on file  Tobacco Use   Smoking status: Every Day    Current packs/day: 1.50    Average packs/day: 1.5 packs/day for 35.0 years (52.5 ttl pk-yrs)    Types: Cigarettes   Smokeless tobacco: Never   Tobacco comments:    11/08/2022 Patient smokes 1/2 pack daily  Vaping Use    Vaping status: Never Used  Substance and Sexual Activity   Alcohol use: Yes    Comment: occasional   Drug use: Never   Sexual activity: Yes  Other Topics Concern   Not on file  Social History Narrative   Not on file   Social Determinants of Health   Financial Resource Strain: Low Risk  (06/10/2022)   Overall Financial Resource Strain (CARDIA)    Difficulty of Paying Living Expenses: Not hard at all  Food Insecurity: No Food Insecurity (06/10/2022)   Hunger Vital Sign    Worried About Running Out of Food in the Last Year: Never true    Ran Out of Food in the Last Year: Never true  Transportation Needs: No Transportation Needs (06/10/2022)   PRAPARE - Administrator, Civil Service (Medical): No    Lack of Transportation (Non-Medical): No  Physical Activity: Insufficiently Active (06/10/2022)  Exercise Vital Sign    Days of Exercise per Week: 2 days    Minutes of Exercise per Session: 20 min  Stress: Stress Concern Present (06/10/2022)   [employer] of Occupational Health - Occupational Stress Questionnaire    Feeling of Stress : To some extent  Social Connections: Moderately Integrated (06/10/2022)   Social Connection and Isolation Panel [NHANES]    Frequency of Communication with Friends and Family: More than three times a week    Frequency of Social Gatherings with Friends and Family: Once a week    Attends Religious Services: More than 4 times per year    Active Member of [employer] or Organizations: No    Attends Engineer, structural: Not on file    Marital Status: Married     Family History: The patient'sfamily history includes Heart attack in his father, maternal grandfather, and maternal grandmother. There is no history of Colon cancer or Colon polyps. ROS:   Please see the history of present illness.     All other systems reviewed and are negative.  EKGs/Labs/Other Studies Reviewed:    Cath: 07/16/21     Prox Cx to Mid Cx lesion is 25% stenosed.    Prox RCA lesion is 25% stenosed.   Mid RCA lesion is 30% stenosed.   Dist RCA lesion is 30% stenosed.   Mid LAD lesion is 95% stenosed.   A drug-eluting stent was successfully placed using a SYNERGY XD 4.0X24.   Post intervention, there is a 0% residual stenosis.   The left ventricular systolic function is normal.   LV end diastolic pressure is normal.   The left ventricular ejection fraction is 55-65% by visual estimate.   Severe thrombotic stenosis mid LAD Successful PTCA/DES x 1 mid LAD Occlusion of the small apical LAD, likely from embolization of thrombus prior to arrival in the cath lab.  Mild non-obstructive disease in the RCA and Circumflex.  Normal LV systolic function Diagnostic Dominance: Right  Intervention      Echo: 07/17/21   IMPRESSIONS     1. Left ventricular ejection fraction, by estimation, is 55 to 60%. The  left ventricle has normal function. The left ventricle has no regional  wall motion abnormalities. Left ventricular diastolic parameters were  normal.   2. Right ventricular systolic function is normal. The right ventricular  size is normal. Tricuspid regurgitation signal is inadequate for assessing  PA pressure.   3. The mitral valve is normal in structure. Trivial mitral valve  regurgitation. No evidence of mitral stenosis.   4. The aortic valve was not well visualized. There is mild calcification  of the aortic valve. Aortic valve regurgitation is trivial. No aortic  stenosis is present.   5. The inferior vena cava is dilated in size with <50% respiratory  variability, suggesting right atrial pressure of 15 mmHg.    Studies Reviewed: Marland Kitchen   EKG Interpretation Date/Time:  Tuesday November 08 2022 16:28:15 EDT Ventricular Rate:  74 PR Interval:  158 QRS Duration:  100 QT Interval:  366 QTC Calculation: 406 R Axis:   29  Text Interpretation: Normal sinus rhythm Incomplete right bundle branch block When compared with ECG of 27-Jul-2021 10:57, No  significant change was found Confirmed by Aryav Wimberly [id number]) on 11/08/2022 4:39:36 PM      Recent Labs: 03/11/2022: ALT 16; TSH 1.220 11/01/2022: BUN 17; Creatinine, Ser 0.80; Hemoglobin 16.1; Platelets 249; Potassium 4.8; Sodium 137   Recent Lipid Panel    Component Value  Date/Time   CHOL 82 (L) 03/11/2022 0836   TRIG 50 03/11/2022 0836   HDL 42 03/11/2022 0836   CHOLHDL 2.0 03/11/2022 0836   CHOLHDL 2.2 07/17/2021 1330   VLDL 17 07/17/2021 1330   LDLCALC 27 03/11/2022 0836    Physical Exam:    VS:  BP 111/88 (BP Location: Left Arm, Patient Position: Sitting, Cuff Size: Normal)   Pulse 74   Ht 5\' 10"  (1.778 m)   Wt 227 lb (103 kg)   SpO2 92%   BMI 32.57 kg/m     Wt Readings from Last 3 Encounters:  11/08/22 227 lb (103 kg)  11/01/22 222 lb (100.7 kg)  10/26/22 221 lb 1.3 oz (100.3 kg)     GEN:  Well nourished, well developed in no acute distress.  Moderately overweight HEENT: Normal NECK: No JVD; No carotid bruits LYMPHATICS: No lymphadenopathy CARDIAC: RRR, no murmurs, rubs, gallops RESPIRATORY: A few rhonchi and wheezes bilaterally ABDOMEN: Soft, non-tender, non-distended MUSCULOSKELETAL:  No edema; No deformity  SKIN: Warm and dry NEUROLOGIC:  Alert and oriented x 3 PSYCHIATRIC:  Normal affect   ASSESSMENT:    1. Coronary artery disease involving native coronary artery of native heart without angina pectoris   2. Dyslipidemia (high LDL; low HDL)   3. Smoking   4. Mild obesity     PLAN:    In order of problems listed above:  1. CAD: Currently asymptomatic.  Anterior STEMI due to 95% stenosis in the mid LAD artery treated with an emergency stent, but without any evidence of lasting myocardial injury, normal LVEF.  On aspirin monotherapy.  Resume metoprolol succinate 25 mg daily. 2. HLP: Exceptionally low LDL, but also with a rather low HDL.  Baseline untreated LDL cholesterol level was 454.  I think it makes sense to keep him on the high-dose  atorvastatin since he had progression of disease though his LDL cholesterol was "at target". 3. Smoking cessation: Once again encouraged smoking cessation.  Both he and his wife to try to quit together.  Discussed behavioral/lifestyle changes which can help success.  I would be glad to prescribe Chantix again if he wants me to.  He needs to commit to smoking cessation plan first.. 4.  Obesity: He has actually gained more weight.  Reviewed the importance of fitness, healthy diet, to prevent progression of CAD.   Medication Adjustments/Labs and Tests Ordered: Current medicines are reviewed at length with the patient today.  Concerns regarding medicines are outlined above. Labs and tests ordered and medication changes are outlined in the patient instructions below:  Patient Instructions  Medication Instructions:  No changes *If you need a refill on your cardiac medications before your next appointment, please call your pharmacy*  Follow-Up: At [facility], you and your health needs are our priority.  As part of our continuing mission to provide you with exceptional heart care, we have created designated Provider Care Teams.  These Care Teams include your primary Cardiologist (physician) and Advanced Practice Providers (APPs -  Physician Assistants and Nurse Practitioners) who all work together to provide you with the care you need, when you need it.  We recommend signing up for the patient portal called "MyChart".  Sign up information is provided on this After Visit Summary.  MyChart is used to connect with patients for Virtual Visits (Telemedicine).  Patients are able to view lab/test results, encounter notes, upcoming appointments, etc.  Non-urgent messages can be sent to your provider as well.  To learn more about what you can do with MyChart, go to [email or web address].    Your next appointment:   1 year(s)  Provider:   Thurmon Fair, MD       Signed, Derrick Fair, MD   11/08/2022 6:09 PM    [facility]

## 2022-11-16 ENCOUNTER — Ambulatory Visit (HOSPITAL_BASED_OUTPATIENT_CLINIC_OR_DEPARTMENT_OTHER): Payer: Managed Care, Other (non HMO)

## 2022-11-18 ENCOUNTER — Ambulatory Visit: Payer: Managed Care, Other (non HMO) | Admitting: Orthopedic Surgery

## 2022-11-18 ENCOUNTER — Encounter: Payer: Self-pay | Admitting: Orthopedic Surgery

## 2022-11-18 ENCOUNTER — Other Ambulatory Visit (INDEPENDENT_AMBULATORY_CARE_PROVIDER_SITE_OTHER): Payer: Managed Care, Other (non HMO)

## 2022-11-18 ENCOUNTER — Other Ambulatory Visit: Payer: Self-pay

## 2022-11-18 VITALS — BP 137/89 | HR 84 | Ht 70.0 in | Wt 223.0 lb

## 2022-11-18 DIAGNOSIS — G8929 Other chronic pain: Secondary | ICD-10-CM

## 2022-11-18 DIAGNOSIS — M25511 Pain in right shoulder: Secondary | ICD-10-CM | POA: Diagnosis not present

## 2022-11-18 DIAGNOSIS — M25512 Pain in left shoulder: Secondary | ICD-10-CM | POA: Diagnosis not present

## 2022-11-18 NOTE — Patient Instructions (Signed)
Please provide a note for work - out of work until 10/21, can return without restrictions   Instructions Following Joint Injections  In clinic today, you received an injection in one of your joints (sometimes more than one).  Occasionally, you can have some pain at the injection site, this is normal.  You can place ice at the injection site, or take over-the-counter medications such as Tylenol (acetaminophen) or Advil (ibuprofen).  Please follow all directions listed on the bottle.  If your joint (knee or shoulder) becomes swollen, red or very painful, please contact the clinic for additional assistance.   Two medications were injected, including lidocaine and a steroid (often referred to as cortisone).  Lidocaine is effective almost immediately but wears off quickly.  However, the steroid can take a few days to improve your symptoms.  In some cases, it can make your pain worse for a couple of days.  Do not be concerned if this happens as it is common.  You can apply ice or take some over-the-counter medications as needed.   Injections in the same joint cannot be repeated for 3 months.  This helps to limit the risk of an infection in the joint.  If you were to develop an infection in your joint, the best treatment option would be surgery.

## 2022-11-18 NOTE — Progress Notes (Signed)
New Patient Visit  Assessment: Derrick Turner is a 52 y.o. male with the following: 1. Chronic pain of both shoulders  Plan: Ares Cardozo Laguardia has pain in both shoulders.  No recent injury.  He is an Personnel officer, a lot of overhead work.  Work is getting more difficult.  XR negative.  History of bilateral shoulder surgery.  He has good ROM with some pain.  Uncomfortable with strength testing.  Recommended injections and some time of work.  Out of work next week.  Return as needed.     Procedure note injection - Right shoulder    Verbal consent was obtained to inject the right shoulder, subacromial space Timeout was completed to confirm the site of injection.   The skin was prepped with alcohol and ethyl chloride was sprayed at the injection site.  A 21-gauge needle was used to inject 40 mg of Depo-Medrol and 1% lidocaine (4 cc) into the subacromial space of the right shoulder using a posterolateral approach.  There were no complications.  A sterile bandage was applied.    Procedure note injection Left shoulder    Verbal consent was obtained to inject the left shoulder, subacromial space Timeout was completed to confirm the site of injection.  The skin was prepped with alcohol and ethyl chloride was sprayed at the injection site.  A 21-gauge needle was used to inject 40 mg of Depo-Medrol and 1% lidocaine (4 cc) into the subacromial space of the left shoulder using a posterolateral approach.  There were no complications. A sterile bandage was applied.   Follow-up: Return if symptoms worsen or fail to improve.  Subjective:  Chief Complaint  Patient presents with   Shoulder Pain    Bilat shoulder pain for yrs. Has had repairs done on both shoulders.  NDC: [address]-1    History of Present Illness: Derrick Turner is a 52 y.o. male who presents for evaluation of bilateral shoulder pain.  He is RHD.  History of bilateral shoulder surgery, left more than 10 years ago, right was  in 2016.  Surgery helped.  He is an Personnel officer and works over head.  It is getting more and more difficult.  He descrobes pain everywhere.  No recent injections in his shoulders.    Review of Systems: No fevers or chills No numbness or tingling No chest pain No shortness of breath No bowel or bladder dysfunction No GI distress No headaches   Medical History:  Past Medical History:  Diagnosis Date   Coronary artery disease cardiologist--- dr croitoru   ETT 04-01-2016 in epic, reproduced chest pain but no EKG changes;  cardiac cath 04-06-2016 moderate diffuse nonobstructive disease, aggressive medical management   History of 2019 novel coronavirus disease (COVID-19) 03/02/2020   per pt tested positive covid, copy of result in epic, stated mild symptoms that resolved   History of kidney stones    Incomplete right bundle branch block    Right ureteral calculus    Wears glasses     Past Surgical History:  Procedure Laterality Date   APPENDECTOMY  teen   BACK SURGERY     two   BIOPSY  08/11/2020   Procedure: BIOPSY;  Surgeon: Lanelle Bal, DO;  [facility];  Service: Endoscopy;;   COLONOSCOPY WITH PROPOFOL N/A 08/11/2020   Procedure: COLONOSCOPY WITH PROPOFOL;  Surgeon: Lanelle Bal, DO;  [facility];  Service: Endoscopy;  Laterality: N/A;  7:30am   CORONARY/GRAFT ACUTE MI REVASCULARIZATION N/A 07/16/2021  Procedure: Coronary/Graft Acute MI Revascularization;  Surgeon: Kathleene Hazel, MD;  [facility];  Service: Cardiovascular;  Laterality: N/A;   CYSTOSCOPY WITH RETROGRADE PYELOGRAM, URETEROSCOPY AND STENT PLACEMENT Right 04/14/2020   Procedure: CYSTOSCOPY WITH RETROGRADE PYELOGRAM, URETEROSCOPY AND STENT PLACEMENT;  Surgeon: Belva Agee, MD;  Location: [facility];  Service: Urology;  Laterality: Right;   EXTRACORPOREAL SHOCK WAVE LITHOTRIPSY Right 03/23/2020   Procedure: EXTRACORPOREAL SHOCK WAVE  LITHOTRIPSY (ESWL);  Surgeon: Crista Elliot, MD;  Location: [facility];  Service: Urology;  Laterality: Right;   HOLMIUM LASER APPLICATION Right 04/14/2020   Procedure: HOLMIUM LASER APPLICATION;  Surgeon: Belva Agee, MD;  Location: [facility];  Service: Urology;  Laterality: Right;   LAPAROSCOPIC CHOLECYSTECTOMY  2012 approx   LEFT HEART CATH AND CORONARY ANGIOGRAPHY N/A 04/06/2016   Procedure: Left Heart Cath and Coronary Angiography;  Surgeon: Tonny Bollman, MD;  Location: [facility];  Service: Cardiovascular;  Laterality: N/A;   LEFT HEART CATH AND CORONARY ANGIOGRAPHY N/A 07/16/2021   Procedure: LEFT HEART CATH AND CORONARY ANGIOGRAPHY;  Surgeon: Kathleene Hazel, MD;  Location: [facility];  Service: Cardiovascular;  Laterality: N/A;   ORCHIECTOMY Right 1990s   per pt benign cyst   PARTIAL COLECTOMY Right 09/02/2020   Procedure: HEMICOLECTOMY;  Surgeon: Franky Macho, MD;  Location[facility];  Service: General;  Laterality: Right;   POLYPECTOMY  08/11/2020   Procedure: POLYPECTOMY;  Surgeon: Lanelle Bal, DO;  [facility];  Service: Endoscopy;;   SHOULDER SURGERY Bilateral left 2019;  right 2016   SHOULDER SURGERY Left    SHOULDER SURGERY Right     Family History  Problem Relation Age of Onset   Heart attack Father    Heart attack Maternal Grandmother    Heart attack Maternal Grandfather    Colon cancer Neg Hx    Colon polyps Neg Hx    Social History   Tobacco Use   Smoking status: Every Day    Current packs/day: 1.50    Average packs/day: 1.5 packs/day for 35.0 years (52.5 ttl pk-yrs)    Types: Cigarettes   Smokeless tobacco: Never   Tobacco comments:    11/08/2022 Patient smokes 1/2 pack daily  Vaping Use   Vaping status: Never Used  Substance Use Topics   Alcohol use: Yes    Comment: occasional   Drug use: Never    No Known Allergies  Current Meds  Medication Sig   atorvastatin  (LIPITOR) 80 MG tablet Take 1 tablet (80 mg total) by mouth daily.   azelastine (ASTELIN) 0.1 % nasal spray Place 2 sprays into both nostrils 2 (two) times daily.   cetirizine (ZYRTEC) 10 MG tablet Take 10 mg by mouth daily.   citalopram (CELEXA) 20 MG tablet TAKE 1 TABLET BY MOUTH EVERY DAY   cyclobenzaprine (FLEXERIL) 5 MG tablet Take 1 tablet (5 mg total) by mouth 3 (three) times daily as needed for muscle spasms.   docusate sodium (COLACE) 100 MG capsule Take 100 mg by mouth at bedtime.   hydrOXYzine (VISTARIL) 25 MG capsule TAKE 1 CAPSULE (25 MG TOTAL) BY MOUTH EVERY 8 (EIGHT) HOURS AS NEEDED.   Probiotic Product (PROBIOTIC PO) Take 1 capsule by mouth daily.   triamcinolone cream (KENALOG) 0.1 % Apply 1 Application topically 2 (two) times daily.    Objective: BP 137/89   Pulse 84   Ht 5\' 10"  (1.778 m)  Wt 223 lb (101.2 kg)   BMI 32.00 kg/m   Physical Exam:  General: Alert and oriented. and No acute distress. Gait: Normal gait.  Bilateral shoulders with well healed anterior based surgical incisions.  No surrounding erythema or drainage.  160 FF.  IR to Lumbar spine.  Exaggerated response with strength testing.  He had some weakness that was affected by effort and pain.  Left hand swollen  IMAGING: I personally ordered and reviewed the following images  XR of bilateral shoulders obtained in clinic today.  No acute injuries.  Glenohumeral joint reduced.  Right shoulder with metal anchor in place, no fracture.  Left shoulder with cystic appearing region anterior humeral head, likely from prior surgery.  No proximal humerus migration.  No bony lesions.   Impression: Bilateral shoulder XR without acute injury; evidence of prior surgery    New Medications:  No orders of the defined types were placed in this encounter.     Oliver Barre, MD  11/18/2022 10:55 PM

## 2022-11-21 ENCOUNTER — Other Ambulatory Visit: Payer: Self-pay | Admitting: Family Medicine

## 2022-11-21 DIAGNOSIS — F419 Anxiety disorder, unspecified: Secondary | ICD-10-CM

## 2022-11-23 ENCOUNTER — Encounter: Payer: Self-pay | Admitting: Orthopaedic Surgery

## 2022-11-23 ENCOUNTER — Other Ambulatory Visit (INDEPENDENT_AMBULATORY_CARE_PROVIDER_SITE_OTHER): Payer: Managed Care, Other (non HMO)

## 2022-11-23 ENCOUNTER — Ambulatory Visit: Payer: Managed Care, Other (non HMO) | Admitting: Orthopaedic Surgery

## 2022-11-23 DIAGNOSIS — M545 Low back pain, unspecified: Secondary | ICD-10-CM | POA: Diagnosis not present

## 2022-11-23 DIAGNOSIS — G8929 Other chronic pain: Secondary | ICD-10-CM | POA: Diagnosis not present

## 2022-11-23 DIAGNOSIS — M542 Cervicalgia: Secondary | ICD-10-CM

## 2022-11-23 MED ORDER — PREDNISONE 10 MG (21) PO TBPK: ORAL_TABLET | ORAL | 3 refills | Status: DC

## 2022-11-23 NOTE — Progress Notes (Signed)
Office Visit Note   Patient: Derrick Turner           Date of Birth: 04/17/1970           MRN: [id number] Visit Date: 11/23/2022              Requested by: Gilmore Laroche, FNP [address] [place],  [place] [address] PCP: Gilmore Laroche, FNP   Assessment & Plan: Visit Diagnoses:  1. Neck pain   2. Chronic bilateral low back pain without sciatica     Plan: Scout is a very pleasant 52 year old gentleman with chronic neck and low back pain.  Appears to be degenerative and does not sound to be radicular in nature.  Will place him on a Dosepak and send him to outpatient physical therapy.  Questions encouraged and answered.  Should his symptoms not improve I have recommended that he follow-up with Ellin Goodie for further evaluation and treatment.  Follow-Up Instructions: No follow-ups on file.   Orders:  Orders Placed This Encounter  Procedures   XR Cervical Spine 2 or 3 views   XR Lumbar Spine 2-3 Views   Ambulatory referral to Physical Therapy   Meds ordered this encounter  Medications   predniSONE (STERAPRED UNI-PAK 21 TAB) 10 MG (21) TBPK tablet    Sig: Take as directed    Dispense:  21 tablet    Refill:  3      Procedures: No procedures performed   Clinical Data: No additional findings.   Subjective: Chief Complaint  Patient presents with   Neck - Pain   Lower Back - Pain    HPI Derrick Turner is a very pleasant 52 year old gentleman here for evaluation of neck and low back pain.  Has pain in the buttock after prolonged sitting.  Sometimes it will travel to the right calf.  Denies any radicular symptoms.  Denies any red flag symptoms.  Denies any injuries.  Review of Systems  Constitutional: Negative.   HENT: Negative.    Eyes: Negative.   Respiratory: Negative.    Cardiovascular: Negative.   Gastrointestinal: Negative.   Endocrine: Negative.   Genitourinary: Negative.   Skin: Negative.   Allergic/Immunologic: Negative.   Neurological: Negative.    Hematological: Negative.   Psychiatric/Behavioral: Negative.    All other systems reviewed and are negative.    Objective: Vital Signs: There were no vitals taken for this visit.  Physical Exam Vitals and nursing note reviewed.  Constitutional:      Appearance: He is well-developed.  HENT:     Head: Normocephalic and atraumatic.  Eyes:     Pupils: Pupils are equal, round, and reactive to light.  Pulmonary:     Effort: Pulmonary effort is normal.  Abdominal:     Palpations: Abdomen is soft.  Musculoskeletal:        General: Normal range of motion.     Cervical back: Neck supple.  Skin:    General: Skin is warm.  Neurological:     Mental Status: He is alert and oriented to person, place, and time.  Psychiatric:        Behavior: Behavior normal.        Thought Content: Thought content normal.        Judgment: Judgment normal.     Ortho Exam Examination of the cervical and lumbar spine are nonfocal.  No motor or sensory deficits in the upper or lower extremities.  Normal reflexes. Specialty Comments:  No specialty comments available.  Imaging: XR Lumbar Spine 2-3 Views  Result Date: 11/23/2022 X-rays of the lumbar spine show mild degenerative changes of the facet joints and mild narrowing at the L5-S1 disc space.  XR Cervical Spine 2 or 3 views  Result Date: 11/23/2022 X-rays of the cervical spine show no acute or structural abnormalities    PMFS History: Patient Active Problem List   Diagnosis Date Noted   Family history of prostate cancer in father 11/04/2022   Hematuria, gross 11/01/2022   Arthralgia 10/28/2022   Anxiety 06/10/2022   Generalized muscle ache 06/10/2022   Nephrolithiasis 04/05/2022   Lesion of left ear 11/05/2021   Tinnitus, bilateral 08/04/2021   Acute MI, 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HCC)    S/P partial colectomy 09/02/2020   Dysplastic colon polyp    Cigarette nicotine dependence without complication 09/26/2017   Chronic right-sided low back  pain with right-sided sciatica 09/26/2017   Mixed hyperlipidemia 09/26/2017   CAD (coronary artery disease) 05/03/2016   Tobacco abuse    Prediabetes    Gastroesophageal reflux disease    ACS (acute coronary syndrome) (HCC) 03/31/2016   Past Medical History:  Diagnosis Date   Coronary artery disease cardiologist--- dr croitoru   ETT 04-01-2016 in epic, reproduced chest pain but no EKG changes;  cardiac cath 04-06-2016 moderate diffuse nonobstructive disease, aggressive medical management   History of 2019 novel coronavirus disease (COVID-19) 03/02/2020   per pt tested positive covid, copy of result in epic, stated mild symptoms that resolved   History of kidney stones    Incomplete right bundle branch block    Right ureteral calculus    Wears glasses     Family History  Problem Relation Age of Onset   Heart attack Father    Heart attack Maternal Grandmother    Heart attack Maternal Grandfather    Colon cancer Neg Hx    Colon polyps Neg Hx     Past Surgical History:  Procedure Laterality Date   APPENDECTOMY  teen   BACK SURGERY     two   BIOPSY  08/11/2020   Procedure: BIOPSY;  Surgeon: Lanelle Bal, DO;  [facility]: [facility];  Service: Endoscopy;;   COLONOSCOPY WITH PROPOFOL N/A 08/11/2020   Procedure: COLONOSCOPY WITH PROPOFOL;  Surgeon: Lanelle Bal, DO;  [facility]: [facility];  Service: Endoscopy;  Laterality: N/A;  7:30am   CORONARY/GRAFT ACUTE MI REVASCULARIZATION N/A 07/16/2021   Procedure: Coronary/Graft Acute MI Revascularization;  Surgeon: Kathleene Hazel, MD;  [facility]: [facility];  Service: Cardiovascular;  Laterality: N/A;   CYSTOSCOPY WITH RETROGRADE PYELOGRAM, URETEROSCOPY AND STENT PLACEMENT Right 04/14/2020   Procedure: CYSTOSCOPY WITH RETROGRADE PYELOGRAM, URETEROSCOPY AND STENT PLACEMENT;  Surgeon: Belva Agee, MD;  Location: [facility];  Service: Urology;  Laterality: Right;   EXTRACORPOREAL SHOCK WAVE  LITHOTRIPSY Right 03/23/2020   Procedure: EXTRACORPOREAL SHOCK WAVE LITHOTRIPSY (ESWL);  Surgeon: Crista Elliot, MD;  Location: [facility];  Service: Urology;  Laterality: Right;   HOLMIUM LASER APPLICATION Right 04/14/2020   Procedure: HOLMIUM LASER APPLICATION;  Surgeon: Belva Agee, MD;  Location: [facility];  Service: Urology;  Laterality: Right;   LAPAROSCOPIC CHOLECYSTECTOMY  2012 approx   LEFT HEART CATH AND CORONARY ANGIOGRAPHY N/A 04/06/2016   Procedure: Left Heart Cath and Coronary Angiography;  Surgeon: Tonny Bollman, MD;  Location: [facility];  Service: Cardiovascular;  Laterality: N/A;   LEFT HEART CATH AND CORONARY ANGIOGRAPHY N/A 07/16/2021  Procedure: LEFT HEART CATH AND CORONARY ANGIOGRAPHY;  Surgeon: Kathleene Hazel, MD;  [facility];  Service: Cardiovascular;  Laterality: N/A;   ORCHIECTOMY Right 1990s   per pt benign cyst   PARTIAL COLECTOMY Right 09/02/2020   Procedure: HEMICOLECTOMY;  Surgeon: Franky Macho, MD;  Location: [facility];  Service: General;  Laterality: Right;   POLYPECTOMY  08/11/2020   Procedure: POLYPECTOMY;  Surgeon: Lanelle Bal, DO;  [facility];  Service: Endoscopy;;   SHOULDER SURGERY Bilateral left 2019;  right 2016   SHOULDER SURGERY Left    SHOULDER SURGERY Right    Social History   Occupational History   Not on file  Tobacco Use   Smoking status: Every Day    Current packs/day: 1.50    Average packs/day: 1.5 packs/day for 35.0 years (52.5 ttl pk-yrs)    Types: Cigarettes   Smokeless tobacco: Never   Tobacco comments:    11/08/2022 Patient smokes 1/2 pack daily  Vaping Use   Vaping status: Never Used  Substance and Sexual Activity   Alcohol use: Yes    Comment: occasional   Drug use: Never   Sexual activity: Yes

## 2022-11-28 ENCOUNTER — Other Ambulatory Visit: Payer: Managed Care, Other (non HMO) | Admitting: Urology

## 2022-12-09 ENCOUNTER — Ambulatory Visit: Payer: Managed Care, Other (non HMO) | Attending: Orthopaedic Surgery | Admitting: Physical Therapy

## 2022-12-09 ENCOUNTER — Other Ambulatory Visit: Payer: Self-pay

## 2022-12-09 DIAGNOSIS — M545 Low back pain, unspecified: Secondary | ICD-10-CM | POA: Insufficient documentation

## 2022-12-09 DIAGNOSIS — M542 Cervicalgia: Secondary | ICD-10-CM | POA: Diagnosis present

## 2022-12-09 DIAGNOSIS — G8929 Other chronic pain: Secondary | ICD-10-CM | POA: Diagnosis not present

## 2022-12-09 DIAGNOSIS — M5459 Other low back pain: Secondary | ICD-10-CM | POA: Diagnosis present

## 2022-12-09 NOTE — Therapy (Signed)
OUTPATIENT PHYSICAL THERAPY CERVICAL/LUMBAR EVALUATION   Patient Name: Derrick Turner MRN: [id number] DOB:[birth year], 52 y.o., male Today's Date: 12/09/2022  END OF SESSION:  PT End of Session - 12/09/22 1222     Visit Number 1    Number of Visits 4    Date for PT Re-Evaluation 01/06/23    PT Start Time 0930    PT Stop Time 1007    PT Time Calculation (min) 37 min    Activity Tolerance Patient tolerated treatment well    Behavior During Therapy [facility] for tasks assessed/performed             Past Medical History:  Diagnosis Date   Coronary artery disease cardiologist--- dr croitoru   ETT 04-01-2016 in epic, reproduced chest pain but no EKG changes;  cardiac cath 04-06-2016 moderate diffuse nonobstructive disease, aggressive medical management   History of 2019 novel coronavirus disease (COVID-19) 03/02/2020   per pt tested positive covid, copy of result in epic, stated mild symptoms that resolved   History of kidney stones    Incomplete right bundle branch block    Right ureteral calculus    Wears glasses    Past Surgical History:  Procedure Laterality Date   APPENDECTOMY  teen   BACK SURGERY     two   BIOPSY  08/11/2020   Procedure: BIOPSY;  Surgeon: Lanelle Bal, DO;  [facility];  Service: Endoscopy;;   COLONOSCOPY WITH PROPOFOL N/A 08/11/2020   Procedure: COLONOSCOPY WITH PROPOFOL;  Surgeon: Lanelle Bal, DO;  [facility];  Service: Endoscopy;  Laterality: N/A;  7:30am   CORONARY/GRAFT ACUTE MI REVASCULARIZATION N/A 07/16/2021   Procedure: Coronary/Graft Acute MI Revascularization;  Surgeon: Kathleene Hazel, MD;  [facility];  Service: Cardiovascular;  Laterality: N/A;   CYSTOSCOPY WITH RETROGRADE PYELOGRAM, URETEROSCOPY AND STENT PLACEMENT Right 04/14/2020   Procedure: CYSTOSCOPY WITH RETROGRADE PYELOGRAM, URETEROSCOPY AND STENT PLACEMENT;  Surgeon: Belva Agee, MD;  Location: [facility];   Service: Urology;  Laterality: Right;   EXTRACORPOREAL SHOCK WAVE LITHOTRIPSY Right 03/23/2020   Procedure: EXTRACORPOREAL SHOCK WAVE LITHOTRIPSY (ESWL);  Surgeon: Crista Elliot, MD;  Location: [facility];  Service: Urology;  Laterality: Right;   HOLMIUM LASER APPLICATION Right 04/14/2020   Procedure: HOLMIUM LASER APPLICATION;  Surgeon: Belva Agee, MD;  Location: [facility];  Service: Urology;  Laterality: Right;   LAPAROSCOPIC CHOLECYSTECTOMY  2012 approx   LEFT HEART CATH AND CORONARY ANGIOGRAPHY N/A 04/06/2016   Procedure: Left Heart Cath and Coronary Angiography;  Surgeon: Tonny Bollman, MD;  Location: [facility]VASIVE CV LAB;  Service: Cardiovascular;  Laterality: N/A;   LEFT HEART CATH AND CORONARY ANGIOGRAPHY N/A 07/16/2021   Procedure: LEFT HEART CATH AND CORONARY ANGIOGRAPHY;  Surgeon: Kathleene Hazel, MD;  [facility];  Service: Cardiovascular;  Laterality: N/A;   ORCHIECTOMY Right 1990s   per pt benign cyst   PARTIAL COLECTOMY Right 09/02/2020   Procedure: HEMICOLECTOMY;  Surgeon: Franky Macho, MD;  Location: [facility];  Service: General;  Laterality: Right;   POLYPECTOMY  08/11/2020   Procedure: POLYPECTOMY;  Surgeon: Lanelle Bal, DO;  [facility];  Service: Endoscopy;;   SHOULDER SURGERY Bilateral left 2019;  right 2016   SHOULDER SURGERY Left    SHOULDER SURGERY Right    Patient Active Problem List   Diagnosis Date Noted   Family history of prostate cancer in father  11/04/2022   Hematuria, gross 11/01/2022   Arthralgia 10/28/2022   Anxiety 06/10/2022   Generalized muscle ache 06/10/2022   Nephrolithiasis 04/05/2022   Lesion of left ear 11/05/2021   Tinnitus, bilateral 08/04/2021   Acute MI, 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HCC)    S/P partial colectomy 09/02/2020   Dysplastic colon polyp    Cigarette nicotine dependence without complication 09/26/2017   Chronic right-sided low back pain with right-sided  sciatica 09/26/2017   Mixed hyperlipidemia 09/26/2017   CAD (coronary artery disease) 05/03/2016   Tobacco abuse    Prediabetes    Gastroesophageal reflux disease    ACS (acute coronary syndrome) (HCC) 03/31/2016    REFERRING PROVIDER: Gershon Mussel MD  REFERRING DIAG: Neck and chronic bilateral low back pain without sciatica.  Rationale for Evaluation and Treatment: Rehabilitation  THERAPY DIAG:  Cervicalgia  Other low back pain  ONSET DATE: Ongoing.  SUBJECTIVE:                                                                                                                                                                                           SUBJECTIVE STATEMENT: The patient presents to the clinic with c/o chronic neck and low back pain.  His neck pain, today, is rated at a 4/10 and higher with movement and " a lot" more pain in his low back.  He recently had a long car ride and had to take a break due to a very high pain-level. He will experience pain into his buttock and right calf.  He has not found anything really decreases his pain.  Sitting and bending decrease pain.     PERTINENT HISTORY:  Bilateral shoulder surgeries and two lumbar surgeries.  PAIN:  Are you having pain? Yes: NPRS scale: 4(neck).."A lot more (lumbar)/10 Pain location: Neck and low back. Pain description: Ache and sharp. Aggravating factors: As above. Relieving factors: As above.  PRECAUTIONS: None  RED FLAGS: None   WEIGHT BEARING RESTRICTIONS: No  FALLS:  Has patient fallen in last 6 months? No  LIVING ENVIRONMENT: Lives with: lives with their spouse Lives in: House/apartment Has following equipment at home: None PLOF: Independent  PATIENT GOALS: Decrease pain and move better with less pain.   OBJECTIVE:  Note: Objective measures were completed at Evaluation unless otherwise noted.  DIAGNOSTIC FINDINGS:  11/23/22:  X-rays of the lumbar spine show mild degenerative changes of the  facet  joints and mild narrowing at the L5-S1 disc space.   11/23/22:  X-rays of the cervical spine show no acute or structural abnormalities   POSTURE: rounded shoulders and forward head  PALPATION: C/o pain in region  of C7-T1 and right SIJ region.  LUMBAR/CERVICAL ROM:  Active bilateral cervical rotation is 70 degrees and active lumbar flexion and extension is WNL.  LOWER EXTREMITY MMT:    Normal bilateral elbow strength and normal bilateral LE strength.  LUMBAR SPECIAL TESTS:  Normal U/LE DTR's.  Right LE slightly longer than left but was equalized with a right SKTC stretch.  GAIT: WNL.   PATIENT EDUCATION:  Education details: See below.  Discussed correct posture and sleeping positions and use of pillows for comfort. Person educated: Patient Education method: Explanation, Demonstration, Tactile cues, and Handouts Education comprehension: verbalized understanding and returned demonstration  HOME EXERCISE PROGRAM: HOME EXERCISE PROGRAM Created by [place] Malayja Freund Nov 1st, 2024 View at [email or web address]-exercise-code.com using code: [email or web address]  Page 1 of 2 5 Exercises RETRACTION / CHIN TUCK Slowly draw your head back so that your ears line up with your shoulders. Repeat 10 Times Hold 5 Seconds Complete 1 Set Perform 6 Times a Day CERVICAL EXTENSION WITH TOWEL - CURVE OF NECK Start with a small hand towel wrapped around the curve of your neck and holding the ends of the towel forward as shown. Next, extend your neck back over the towel as to look up at the ceiling. Then, return to starting position.  Your hands should remain still and holding the ends of the towel the entire time. Repeat 5 Times Hold 2 Seconds Complete 1 Set Perform 3 Times a Day SINGLE KNEE TO CHEST STRETCH - SKTC While Lying on your back, hold your knee and gently pull it up towards your chest. Repeat 3 Times Hold 1 Minute Complete 1 Set Perform 3 Times a Day Double Knee to chest stretch Pull your knees up  to your chest hold for 30 seconds then relax. Repeat Continue to breathe throughout stretch. Repeat 2 Times Hold 30 Seconds Complete 3 Sets Perform 2 Times a Day Bridges While laying on your back, contract the low abdominals and lift from the hips. Repeat 15 Times Hold 2 Seconds Complete 2 Sets Perform 2 Times a Day Created by [place] Collene Massimino Page 2   ASSESSMENT:  CLINICAL IMPRESSION: The patient presents to [facility] with c/o chronic neck and low back pain.  His low back pain in particular can become very painful and affects his quality of life.  He experiences pain into his right buttock and calf.  Prolonged sitting is very painful.  He demonstrates normal U/LE DTR's and he exhibits no strength deficits.  He c/o pain around the C7-T1 region and right SIJ.  Patient was instructed in an HEP.  He plans to limit his visits and may come back in a couple of weeks for a follow-up.  He is motivated to improve.  Patient will benefit from skilled physical therapy intervention to address pain and deficits.   OBJECTIVE IMPAIRMENTS: decreased activity tolerance, increased muscle spasms, postural dysfunction, and pain.   ACTIVITY LIMITATIONS: bending and sitting  PARTICIPATION LIMITATIONS: occupation  PERSONAL FACTORS: Time since onset of injury/illness/exacerbation and 1 comorbidity: 2 prior lumbar surgeries.  are also affecting patient's functional outcome.   REHAB POTENTIAL: Good  CLINICAL DECISION MAKING: Evolving/moderate complexity  EVALUATION COMPLEXITY: Low   GOALS:   SHORT TERM GOALS: Target date: 01/06/23  Ind with a HEP. Goal status: INITIAL  2.  Sit 30 minutes with pain not > 4/10.  Goal status: INITIAL  3.  Perform ADL's with pain not > 4/10.  Goal status: INITIAL  PLAN:  PT FREQUENCY:  4 visits.  PT DURATION:  4 weeks  PLANNED INTERVENTIONS: 97110-Therapeutic exercises, 97530- Therapeutic activity, [id number]- Neuromuscular re-education, 97535- Self Care, 82956- Manual  therapy, 97014- Electrical stimulation (unattended), 97035- Ultrasound, Patient/Family education, Cryotherapy, and Moist heat.  PLAN FOR NEXT SESSION: Please HEP, adductor squeezes, draw-in education, core exercise progression, spinal protection techniques and body mechanics training.   Niah Heinle, [place], PT 12/09/2022, 1:22 PM

## 2022-12-13 ENCOUNTER — Encounter: Payer: Self-pay | Admitting: Orthopedic Surgery

## 2022-12-30 ENCOUNTER — Encounter: Payer: Managed Care, Other (non HMO) | Admitting: *Deleted

## 2022-12-30 ENCOUNTER — Ambulatory Visit (INDEPENDENT_AMBULATORY_CARE_PROVIDER_SITE_OTHER): Payer: Managed Care, Other (non HMO) | Admitting: Orthopedic Surgery

## 2022-12-30 DIAGNOSIS — M65322 Trigger finger, left index finger: Secondary | ICD-10-CM

## 2022-12-30 NOTE — Progress Notes (Signed)
   Derrick Turner - 52 y.o. male MRN [id number]  Date of birth: [birth year]  Office Visit Note: Visit Date: 12/30/2022 PCP: Gilmore Laroche, FNP Referred by: Gilmore Laroche, FNP  Subjective:  HPI: Derrick Turner is a 52 y.o. male who presents today for follow up 2 weeks status post left index finger trigger release.  He is doing well overall, pain is controlled.  Did have a skin reaction to the prep with ongoing itching which is improving.  Pertinent ROS were reviewed with the patient and found to be negative unless otherwise specified above in HPI.   Assessment & Plan: Visit Diagnoses: No diagnosis found.  Plan: Sutures removed today, we will begin range of motion exercise with progression to strengthening.  Will have him see occupational therapy given his ongoing stiffness and the severity of his stenosing tenosynovitis preoperatively.  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  Follow-up: No follow-ups on file.   Meds & Orders: No orders of the defined types were placed in this encounter.  No orders of the defined types were placed in this encounter.    Procedures: No procedures performed       Objective:   Vital Signs: There were no vitals taken for this visit.  Ortho Exam Left hand: - Well-healing incision at the base of the index finger, skin is well-approximated, no erythema or drainage - Able to perform digital range of motion without residual clicking or locking, range of motion is somewhat limited at the index finger secondary to swelling and stiffness - Sensation intact distally, hand is warm well-perfused   Imaging: No results found.   Trust Crago Trevor Mace, M.D.  [facility] 8:31 AM

## 2022-12-30 NOTE — Therapy (Signed)
OUTPATIENT OCCUPATIONAL THERAPY ORTHO EVALUATION  Patient Name: Derrick Turner MRN: [id number] DOB:1970-12-11, 52 y.o., male Today's Date: 01/02/2023  PCP: Gilmore Laroche, FNP REFERRING PROVIDER:  Samuella Cota, MD    END OF SESSION:  OT End of Session - 01/02/23 1101     Visit Number 1    Number of Visits 10    Date for OT Re-Evaluation 02/17/23    Authorization Type [employer]    OT Start Time 1101    OT Stop Time 1159    OT Time Calculation (min) 58 min    Equipment Utilized During Treatment edema glove    Activity Tolerance Patient tolerated treatment well;No increased pain;Patient limited by fatigue;Patient limited by pain    Behavior During Therapy [facility] for tasks assessed/performed             Past Medical History:  Diagnosis Date   Coronary artery disease cardiologist--- dr croitoru   ETT 04-01-2016 in epic, reproduced chest pain but no EKG changes;  cardiac cath 04-06-2016 moderate diffuse nonobstructive disease, aggressive medical management   History of 2019 novel coronavirus disease (COVID-19) 03/02/2020   per pt tested positive covid, copy of result in epic, stated mild symptoms that resolved   History of kidney stones    Incomplete right bundle branch block    Right ureteral calculus    Wears glasses    Past Surgical History:  Procedure Laterality Date   APPENDECTOMY  teen   BACK SURGERY     two   BIOPSY  08/11/2020   Procedure: BIOPSY;  Surgeon: Lanelle Bal, DO;  [facility];  Service: Endoscopy;;   COLONOSCOPY WITH PROPOFOL N/A 08/11/2020   Procedure: COLONOSCOPY WITH PROPOFOL;  Surgeon: Lanelle Bal, DO;  [facility];  Service: Endoscopy;  Laterality: N/A;  7:30am   CORONARY/GRAFT ACUTE MI REVASCULARIZATION N/A 07/16/2021   Procedure: Coronary/Graft Acute MI Revascularization;  Surgeon: Kathleene Hazel, MD;  [facility];  Service: Cardiovascular;  Laterality: N/A;   CYSTOSCOPY WITH RETROGRADE  PYELOGRAM, URETEROSCOPY AND STENT PLACEMENT Right 04/14/2020   Procedure: CYSTOSCOPY WITH RETROGRADE PYELOGRAM, URETEROSCOPY AND STENT PLACEMENT;  Surgeon: Belva Agee, MD;  Location: [facility];  Service: Urology;  Laterality: Right;   EXTRACORPOREAL SHOCK WAVE LITHOTRIPSY Right 03/23/2020   Procedure: EXTRACORPOREAL SHOCK WAVE LITHOTRIPSY (ESWL);  Surgeon: Crista Elliot, MD;  Location: [facility];  Service: Urology;  Laterality: Right;   HOLMIUM LASER APPLICATION Right 04/14/2020   Procedure: HOLMIUM LASER APPLICATION;  Surgeon: Belva Agee, MD;  Location: [facility];  Service: Urology;  Laterality: Right;   LAPAROSCOPIC CHOLECYSTECTOMY  2012 approx   LEFT HEART CATH AND CORONARY ANGIOGRAPHY N/A 04/06/2016   Procedure: Left Heart Cath and Coronary Angiography;  Surgeon: Tonny Bollman, MD;  Location: [facility] INVASIVE CV LAB;  Service: Cardiovascular;  Laterality: N/A;   LEFT HEART CATH AND CORONARY ANGIOGRAPHY N/A 07/16/2021   Procedure: LEFT HEART CATH AND CORONARY ANGIOGRAPHY;  Surgeon: Kathleene Hazel, MD;  [facility];  Service: Cardiovascular;  Laterality: N/A;   ORCHIECTOMY Right 1990s   per pt benign cyst   PARTIAL COLECTOMY Right 09/02/2020   Procedure: HEMICOLECTOMY;  Surgeon: Franky Macho, MD;  [facility]: [facility];  Service: General;  Laterality: Right;   POLYPECTOMY  08/11/2020   Procedure: POLYPECTOMY;  Surgeon: Lanelle Bal, DO;  [facility];  Service: Endoscopy;;   SHOULDER SURGERY Bilateral left 2019;  right 2016   SHOULDER SURGERY Left    SHOULDER SURGERY Right    Patient Active Problem List   Diagnosis Date Noted   Family history of prostate cancer in father 11/04/2022   Hematuria, gross 11/01/2022   Arthralgia 10/28/2022   Anxiety 06/10/2022   Generalized muscle ache 06/10/2022   Nephrolithiasis 04/05/2022   Lesion of left ear 11/05/2021   Tinnitus, bilateral 08/04/2021    Acute MI, 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HCC)    S/P partial colectomy 09/02/2020   Dysplastic colon polyp    Cigarette nicotine dependence without complication 09/26/2017   Chronic right-sided low back pain with right-sided sciatica 09/26/2017   Mixed hyperlipidemia 09/26/2017   CAD (coronary artery disease) 05/03/2016   Tobacco abuse    Prediabetes    Gastroesophageal reflux disease    ACS (acute coronary syndrome) (HCC) 03/31/2016    ONSET DATE: DOS 12/16/22  REFERRING DIAG: J47.829 (ICD-10-CM) - Trigger index finger of left hand   THERAPY DIAG:  Trigger index finger of left hand  Localized edema  Muscle weakness (generalized)  Pain in left hand  Stiffness of left hand, not elsewhere classified  Other lack of coordination  Rationale for Evaluation and Treatment: Rehabilitation  SUBJECTIVE:   SUBJECTIVE STATEMENT: He states Rt hand RF trigger was the "bad one" about 3 months ago, but a cortisone injection seemed to relieve it. He states the injection to Lt IF did not help, it hurt, and now he presents ~2 weeks after sx. 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  He is currently out of work until late December.  He does state a history of inflammation in his body with benefits from receiving steroids.   PERTINENT HISTORY: 2+ weeks s/p left index trigger release   PRECAUTIONS: None;  RED FLAGS: None   WEIGHT BEARING RESTRICTIONS: Yes <5# recommended in Lt hand for next month  PAIN:  Are you having pain? Yes: NPRS scale: 0/10 at rest but with movement up to 5-6/10 Pain location: Lt hand IF Pain description: Aching and swollen Aggravating factors: Weightbearing Relieving factors: Rest  FALLS: Has patient fallen in last 6 months? 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  NEXT MD VISIT: 01/27/23   OBJECTIVE: (All objective assessments below  are from initial evaluation on: 01/02/23 unless otherwise specified.)   HAND DOMINANCE: LEFT  ADLs: Overall ADLs: States decreased ability to grab, hold household objects, pain and difficulty to open containers, perform FMS tasks (manipulate fasteners on clothing)   FUNCTIONAL OUTCOME MEASURES: Eval: Quick DASH 68% impairment today  (Higher % Score  =  More Impairment)    UPPER EXTREMITY ROM     Shoulder to Wrist AROM Left eval  Forearm supination 76  Forearm pronation  73  Wrist flexion 49  Wrist extension 41  (Blank rows = not tested)   Hand AROM Left eval  Full Fist Ability (or Gap to Distal Palmar Crease) 4cm from tip of MF to [facility]  Thumb Opposition  (Kapandji Scale)  5/10  Index MCP (0-90)  0 - 41  Index PIP (0-100)  (-26) - 54   Index DIP (0-70)  (-3)- 27   (Blank rows = not tested)   UPPER EXTREMITY MMT:    Eval:  NT at eval due to recent and still healing injuries. Will be tested when appropriate.   MMT Left  TBD  Elbow flexion   Elbow extension   Forearm supination   Forearm pronation   Wrist flexion   Wrist extension   Wrist ulnar deviation   Wrist radial deviation   (Blank rows = not tested)  HAND FUNCTION: Eval: Observed weakness in affected Lt hand.  Details will be tested when safe Grip strength Right: 47 lbs, Left: TBD lbs   COORDINATION: Eval: Observed coordination impairments with affected Lt hand. 9 Hole Peg Test Left: 54 sec (25 sec is WFL)   SENSATION: Eval:  Light touch intact today, though diminished around sx area    EDEMA:   Eval: Severely swollen in Lt hand and palm- all fingers today, 9.5cm circumferentially around Lt P1 (compared to 8.4cm in Rt hand)  COGNITION: Eval: Overall cognitive status: WFL for evaluation today   OBSERVATIONS:   Eval: He has some apparent skin issues with both hands including nails that may have a fungus, are yellowed and curling.  His left hand is extremely swollen and the skin is flaking off.   Fortunately his surgical area does not look infected, no secretions, no smell, however his hand is atypically very swollen after the surgery and all of his fingers even through his wrist.  This is a suspicious symptom that may point to some type of allergy or autoimmune disease.  He was recommended to follow-up with his PCP about possible autoimmune testing if his swelling does not improve in the next 1 to 2 weeks.  He does appear to have some hesitancy and pain when using his hand which could account for some of the swelling as well.  He has a bit of a PIP joint extension lag which OT will monitor and make an orthosis for if necessary.  Lt Dom IF TFR   TODAY'S TREATMENT:  Post-evaluation treatment:   He was given self-care/safety recommendations to avoid soaking his hand, avoid heat for now, use ice to decrease swelling, use Aquaphor or other lotion to keep the surgical site moist but not wet, to avoid direct pressure to his surgery or any repetitive or painful gripping, pushing, pulling in the next 3 to 4 weeks.  He needs to keep it clean and contact his physician immediately if he notices signs of infection.  To help manage swelling, OT supplied him with a large edema glove for the left hand.  It fits well and he states understanding to wear as often as possible in the day and each night.  Next, he was given the following home exercise program to perform 4-6 times a day as tolerated-lightly and non-painfully.  These exercises were demonstrated to him after an explanation and he demonstrates back with no significant pains.   Exercises - Reach arms upward   - 4 x daily - 10 reps - Bend and Pull Back Wrist SLOWLY  - 4 x daily - 10-15 reps - Finger Spreading  - 4-6 x daily - 10-15 reps - Tendon Glides  - 4-6 x daily - 3-5 reps - 2-3 seconds hold - Thumb Opposition  - 4-6 x daily - 10 reps - Tip Joint Blocking Motion  - 4-6 x daily - 10-15 reps - Hand AROM PIP Blocking  - 4-6 x daily - 10-15  reps - Hand AROM Reverse Blocking  - 4-6 x daily - 10-15 reps  Patient Education - Scar Massage    PATIENT EDUCATION: Education details: See tx section above for details  Person educated: Patient Education method: Verbal Instruction, Teach back,  Handouts  Education comprehension: States and demonstrates understanding, Additional Education required    HOME EXERCISE PROGRAM: Access Code: [email or web address] URL: [email or web address] Date: 01/02/2023 Prepared by: Fannie Knee   GOALS: Goals reviewed with patient? Yes   SHORT TERM GOALS: (STG required if POC>30 days) Target Date: 11/20/22  Pt will obtain protective, custom orthotic. Goal status: TBD/PRN  2.  Pt will demo/state understanding of initial HEP to improve pain levels and prerequisite motion. Goal status: INITIAL   LONG TERM GOALS: Target Date: 02/16/22  Pt will improve functional ability by decreased impairment per Quick DASH assessment from 68% to 18% or better, for better quality of life. Goal status: INITIAL  2.  Pt will improve grip strength in Lt hand from unsafe to at least 40lbs for functional use at home and in IADLs. Goal status: INITIAL  3.  Pt will improve A/ROM in Lt wrist flex/ext from 49/41 to at least 60* each, to have functional motion for tasks like reach and grasp.  Goal status: INITIAL  4. Pt will improve coordination skills in Lt dom hand, as seen by [facility] score on nine-hole peg testing to have increased functional ability to carry out fine motor tasks (fasteners, etc.) and more complex, coordinated IADLs (meal prep, sports, etc.).  Goal status: INITIAL  6.  Pt will decrease pain at worst from 5-6/10 to 1/10 or better to have better sleep and occupational participation in daily roles. Goal status: INITIAL    ASSESSMENT:  CLINICAL IMPRESSION: Patient is a 52 y.o. male who was seen today for occupational therapy evaluation for left thumb and hand trigger finger release surgery  and subsequent stiffness, pain, swelling and decreased functional ability.  OT does have concern for his extreme swelling at 2 weeks post surgery and if this does not improve in the next 1 to 2 weeks OT will recommend him to get tested for perhaps autoimmune disease, or something that could cause this extreme swelling.  Otherwise, he will benefit from outpatient occupational therapy to decrease negative symptoms and increase occupational performance.  PERFORMANCE DEFICITS: in functional skills including ADLs, IADLs, coordination, dexterity, sensation, edema, ROM, strength, pain, fascial restrictions, Fine motor control, body mechanics, endurance, decreased knowledge of precautions, wound, and UE functional use, cognitive skills including problem solving and safety awareness, and psychosocial skills including coping strategies, habits, and routines and behaviors.   IMPAIRMENTS: are limiting patient from ADLs, IADLs, work, leisure, and social participation.   COMORBIDITIES: may have co-morbidities  that affects occupational performance. Patient will benefit from skilled OT to address above impairments and improve overall function.  MODIFICATION OR ASSISTANCE TO COMPLETE EVALUATION: No modification of tasks or assist necessary to complete an evaluation.  OT OCCUPATIONAL PROFILE AND HISTORY: Problem focused assessment: Including review of records relating to presenting problem.  CLINICAL DECISION MAKING: LOW - limited treatment options, no task modification necessary  REHAB POTENTIAL: Good  EVALUATION COMPLEXITY: Low      PLAN:  OT FREQUENCY: 1-2x/week  OT DURATION: 6 weeks through 02/17/2023 and up to 10 total visits as medically needed  PLANNED INTERVENTIONS: 97168 OT Re-evaluation, 97535 self care/ADL training, 72536 therapeutic exercise, 97530 therapeutic activity, 97112 neuromuscular re-education, 97140 manual therapy, 97035 ultrasound, 97039 fluidotherapy, 97010 moist heat, 97010  cryotherapy, 97034 contrast bath, 97760 Orthotics management and training, 64403 Splinting (initial encounter), [id number] Subsequent splinting/medication, scar mobilization, compression bandaging, Dry needling, coping strategies training, and patient/family education  RECOMMENDED OTHER SERVICES: None now  CONSULTED AND AGREED WITH PLAN OF CARE: Patient  PLAN  FOR NEXT SESSION:   Review home exercise program and initial recommendations, keep a close eye on swelling reduction, upgrade to stretches and 1 to 2 weeks as tolerated   Fannie Knee, OTR/L, CHT 01/02/2023, 12:32 PM

## 2023-01-02 ENCOUNTER — Ambulatory Visit (INDEPENDENT_AMBULATORY_CARE_PROVIDER_SITE_OTHER): Payer: Managed Care, Other (non HMO) | Admitting: Rehabilitative and Restorative Service Providers"

## 2023-01-02 ENCOUNTER — Other Ambulatory Visit: Payer: Self-pay

## 2023-01-02 DIAGNOSIS — M6281 Muscle weakness (generalized): Secondary | ICD-10-CM

## 2023-01-02 DIAGNOSIS — M25642 Stiffness of left hand, not elsewhere classified: Secondary | ICD-10-CM

## 2023-01-02 DIAGNOSIS — M79642 Pain in left hand: Secondary | ICD-10-CM

## 2023-01-02 DIAGNOSIS — M65322 Trigger finger, left index finger: Secondary | ICD-10-CM

## 2023-01-02 DIAGNOSIS — R600 Localized edema: Secondary | ICD-10-CM

## 2023-01-02 DIAGNOSIS — R278 Other lack of coordination: Secondary | ICD-10-CM

## 2023-01-09 ENCOUNTER — Ambulatory Visit: Payer: Managed Care, Other (non HMO) | Attending: Orthopaedic Surgery | Admitting: *Deleted

## 2023-01-09 ENCOUNTER — Encounter: Payer: Self-pay | Admitting: *Deleted

## 2023-01-09 DIAGNOSIS — M5459 Other low back pain: Secondary | ICD-10-CM | POA: Diagnosis present

## 2023-01-09 DIAGNOSIS — M542 Cervicalgia: Secondary | ICD-10-CM | POA: Insufficient documentation

## 2023-01-09 NOTE — Therapy (Signed)
OUTPATIENT PHYSICAL THERAPY CERVICAL/LUMBAR EVALUATION   Patient Name: Derrick Turner MRN: [id number] DOB:19-Jan-1971, 52 y.o., male Today's Date: 01/09/2023  END OF SESSION:  PT End of Session - 01/09/23 0943     Visit Number 2    Number of Visits 4    Date for PT Re-Evaluation 01/06/23    PT Start Time 0943    PT Stop Time 1020    PT Time Calculation (min) 37 min             Past Medical History:  Diagnosis Date   Coronary artery disease cardiologist--- dr croitoru   ETT 04-01-2016 in epic, reproduced chest pain but no EKG changes;  cardiac cath 04-06-2016 moderate diffuse nonobstructive disease, aggressive medical management   History of 2019 novel coronavirus disease (COVID-19) 03/02/2020   per pt tested positive covid, copy of result in epic, stated mild symptoms that resolved   History of kidney stones    Incomplete right bundle branch block    Right ureteral calculus    Wears glasses    Past Surgical History:  Procedure Laterality Date   APPENDECTOMY  teen   BACK SURGERY     two   BIOPSY  08/11/2020   Procedure: BIOPSY;  Surgeon: Lanelle Bal, DO;  [facility];  Service: Endoscopy;;   COLONOSCOPY WITH PROPOFOL N/A 08/11/2020   Procedure: COLONOSCOPY WITH PROPOFOL;  Surgeon: Lanelle Bal, DO;  [facility];  Service: Endoscopy;  Laterality: N/A;  7:30am   CORONARY/GRAFT ACUTE MI REVASCULARIZATION N/A 07/16/2021   Procedure: Coronary/Graft Acute MI Revascularization;  Surgeon: Kathleene Hazel, MD;  [facility];  Service: Cardiovascular;  Laterality: N/A;   CYSTOSCOPY WITH RETROGRADE PYELOGRAM, URETEROSCOPY AND STENT PLACEMENT Right 04/14/2020   Procedure: CYSTOSCOPY WITH RETROGRADE PYELOGRAM, URETEROSCOPY AND STENT PLACEMENT;  Surgeon: Belva Agee, MD;  Location: [facility];  Service: Urology;  Laterality: Right;   EXTRACORPOREAL SHOCK WAVE LITHOTRIPSY Right 03/23/2020   Procedure:  EXTRACORPOREAL SHOCK WAVE LITHOTRIPSY (ESWL);  Surgeon: Crista Elliot, MD;  Location: [facility];  Service: Urology;  Laterality: Right;   HOLMIUM LASER APPLICATION Right 04/14/2020   Procedure: HOLMIUM LASER APPLICATION;  Surgeon: Belva Agee, MD;  Location: [facility];  Service: Urology;  Laterality: Right;   LAPAROSCOPIC CHOLECYSTECTOMY  2012 approx   LEFT HEART CATH AND CORONARY ANGIOGRAPHY N/A 04/06/2016   Procedure: Left Heart Cath and Coronary Angiography;  Surgeon: Tonny Bollman, MD;  Location: [facility] INVASIVE CV LAB;  Service: Cardiovascular;  Laterality: N/A;   LEFT HEART CATH AND CORONARY ANGIOGRAPHY N/A 07/16/2021   Procedure: LEFT HEART CATH AND CORONARY ANGIOGRAPHY;  Surgeon: Kathleene Hazel, MD;  [facility];  Service: Cardiovascular;  Laterality: N/A;   ORCHIECTOMY Right 1990s   per pt benign cyst   PARTIAL COLECTOMY Right 09/02/2020   Procedure: HEMICOLECTOMY;  Surgeon: Franky Macho, MD;  [facility];  Service: General;  Laterality: Right;   POLYPECTOMY  08/11/2020   Procedure: POLYPECTOMY;  Surgeon: Lanelle Bal, DO;  [facility];  Service: Endoscopy;;   SHOULDER SURGERY Bilateral left 2019;  right 2016   SHOULDER SURGERY Left    SHOULDER SURGERY Right    Patient Active Problem List   Diagnosis Date Noted   Family history of prostate cancer in father 11/04/2022   Hematuria, gross 11/01/2022   Arthralgia 10/28/2022   Anxiety 06/10/2022   Generalized muscle ache  06/10/2022   Nephrolithiasis 04/05/2022   Lesion of left ear 11/05/2021   Tinnitus, bilateral 08/04/2021   Acute MI, 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HCC)    S/P partial colectomy 09/02/2020   Dysplastic colon polyp    Cigarette nicotine dependence without complication 09/26/2017   Chronic right-sided low back pain with right-sided sciatica 09/26/2017   Mixed hyperlipidemia 09/26/2017   CAD (coronary artery disease) 05/03/2016   Tobacco abuse     Prediabetes    Gastroesophageal reflux disease    ACS (acute coronary syndrome) (HCC) 03/31/2016    REFERRING PROVIDER: Gershon Mussel MD  REFERRING DIAG: Neck and chronic bilateral low back pain without sciatica.  Rationale for Evaluation and Treatment: Rehabilitation  THERAPY DIAG:  Cervicalgia  Other low back pain  ONSET DATE: Ongoing.  SUBJECTIVE:                                                                                                                                                                                           SUBJECTIVE STATEMENT: The patient presents to the clinic 10 mins late today. Neck feels okay today, but LB hurts today. 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 Systemic problem?  Bilateral shoulder surgeries and two lumbar surgeries.  PAIN:  Are you having pain? Yes: NPRS scale: 2(neck)..4/10 Pain location: Neck and low back. Pain description: Ache and sharp. Aggravating factors: As above. Relieving factors: As above.  PRECAUTIONS: None  RED FLAGS: None   WEIGHT BEARING RESTRICTIONS: No  FALLS:  Has patient fallen in last 6 months? No  LIVING ENVIRONMENT: Lives with: lives with their spouse Lives in: House/apartment Has following equipment at home: None PLOF: Independent  PATIENT GOALS: Decrease pain and move better with less pain.   OBJECTIVE:  Note: Objective measures were completed at Evaluation unless otherwise noted.  Date   01-09-2023                                    EXERCISE LOG   LB  Exercise Repetitions and Resistance Comments  SKTC X 5 hold 10-30 secs   Bridging X 10 hold 5 secs   AB  bracing X 5 hold 5 secs   Bent knee raise  X 6 hold 5 secs   Seated knee raise X 6 hold 5 secs   SOC Pelvic tilts x 10   mobility    Blank cell = exercise not performed today   Discussed walking program and reviewed HEP. Pt reports unable to get in the floor at home and no pressure on LT hand  DIAGNOSTIC FINDINGS:  11/23/22:  X-rays of  the lumbar spine show mild degenerative changes of the facet  joints and mild narrowing at the L5-S1 disc space.   11/23/22:  X-rays of the cervical spine show no acute or structural abnormalities   POSTURE: rounded shoulders and forward head  PALPATION: C/o pain in region of C7-T1 and right SIJ region.  LUMBAR/CERVICAL ROM:  Active bilateral cervical rotation is 70 degrees and active lumbar flexion and extension is WNL.  LOWER EXTREMITY MMT:    Normal bilateral elbow strength and normal bilateral LE strength.  LUMBAR SPECIAL TESTS:  Normal U/LE DTR's.  Right LE slightly longer than left but was equalized with a right SKTC stretch.  GAIT: WNL.   PATIENT EDUCATION:  Education details: See below.  Discussed correct posture and sleeping positions and use of pillows for comfort. Person educated: Patient Education method: Explanation, Demonstration, Tactile cues, and Handouts Education comprehension: verbalized understanding and returned demonstration  HOME EXERCISE PROGRAM: HOME EXERCISE PROGRAM Created by [place] Applegate Nov 1st, 2024 View at [email or web address] using code: [email or web address]  Page 1 of 2 5 Exercises RETRACTION / CHIN TUCK Slowly draw your head back so that your ears line up with your shoulders. Repeat 10 Times Hold 5 Seconds Complete 1 Set Perform 6 Times a Day CERVICAL EXTENSION WITH TOWEL - CURVE OF NECK Start with a small hand towel wrapped around the curve of your neck and holding the ends of the towel forward as shown. Next, extend your neck back over the towel as to look up at the ceiling. Then, return to starting position.  Your hands should remain still and holding the ends of the towel the entire time. Repeat 5 Times Hold 2 Seconds Complete 1 Set Perform 3 Times a Day SINGLE KNEE TO CHEST STRETCH - SKTC While Lying on your back, hold your knee and gently pull it up towards your chest. Repeat 3 Times Hold 1 Minute Complete 1 Set Perform 3 Times a  Day Double Knee to chest stretch Pull your knees up to your chest hold for 30 seconds then relax. Repeat Continue to breathe throughout stretch. Repeat 2 Times Hold 30 Seconds Complete 3 Sets Perform 2 Times a Day Bridges While laying on your back, contract the low abdominals and lift from the hips. Repeat 15 Times Hold 2 Seconds Complete 2 Sets Perform 2 Times a Day Created by [place] Applegate Page 2   ASSESSMENT:  CLINICAL IMPRESSION: The patient presents to OPPT with c/o chronic neck and low back pain with his LB being the worse today.He also reports he is unable to use his LT hand for exs and is wearing an edema glove. Today's Rx focused on HEP with modifications as well as progressions with core activation added. Handout given with sets and reps all performed daily. Pt unable to get in/ out of the floor and unable to use LT hand for therex.      OBJECTIVE IMPAIRMENTS: decreased activity tolerance, increased muscle spasms, postural dysfunction, and pain.   ACTIVITY LIMITATIONS: bending and sitting  PARTICIPATION LIMITATIONS: occupation  PERSONAL FACTORS: Time since onset of injury/illness/exacerbation and 1 comorbidity: 2 prior lumbar surgeries.  are also affecting patient's functional outcome.   REHAB POTENTIAL: Good  CLINICAL DECISION MAKING: Evolving/moderate complexity  EVALUATION COMPLEXITY: Low   GOALS:   SHORT TERM GOALS: Target date: 01/06/23  Ind with a HEP. Goal status: INITIAL  2.  Sit 30 minutes with pain not > 4/10.  Goal status: INITIAL  3.  Perform  ADL's with pain not > 4/10.  Goal status: INITIAL  PLAN:  PT FREQUENCY:  4 visits.  PT DURATION: 4 weeks  PLANNED INTERVENTIONS: 97110-Therapeutic exercises, 97530- Therapeutic activity, [id number]- Neuromuscular re-education, 97535- Self Care, 33295- Manual therapy, 97014- Electrical stimulation (unattended), 97035- Ultrasound, Patient/Family education, Cryotherapy, and Moist heat.  PLAN FOR NEXT  SESSION: Please HEP, adductor squeezes, draw-in education, core exercise progression, spinal protection techniques and body mechanics training.   Shailynn Fong,CHRIS, PTA 01/09/2023, 1:20 PM

## 2023-01-09 NOTE — Therapy (Signed)
OUTPATIENT OCCUPATIONAL THERAPY TREATMENT NOTE  Patient Name: ESPIRIDION Turner MRN: [id number] DOB:1970-07-24, 52 y.o., male Today's Date: 01/10/2023  PCP: Gilmore Laroche, FNP REFERRING PROVIDER:  Samuella Cota, MD    END OF SESSION:  OT End of Session - 01/10/23 1119     Visit Number 2    Number of Visits 10    Date for OT Re-Evaluation 02/17/23    Authorization Type [employer]    OT Start Time 1119    OT Stop Time 1153    OT Time Calculation (min) 34 min    Equipment Utilized During Treatment --    Activity Tolerance Patient tolerated treatment well;No increased pain;Patient limited by fatigue    Behavior During Therapy [facility] for tasks assessed/performed              Past Medical History:  Diagnosis Date   Coronary artery disease cardiologist--- dr croitoru   ETT 04-01-2016 in epic, reproduced chest pain but no EKG changes;  cardiac cath 04-06-2016 moderate diffuse nonobstructive disease, aggressive medical management   History of 2019 novel coronavirus disease (COVID-19) 03/02/2020   per pt tested positive covid, copy of result in epic, stated mild symptoms that resolved   History of kidney stones    Incomplete right bundle branch block    Right ureteral calculus    Wears glasses    Past Surgical History:  Procedure Laterality Date   APPENDECTOMY  teen   BACK SURGERY     two   BIOPSY  08/11/2020   Procedure: BIOPSY;  Surgeon: Lanelle Bal, DO;  Location: [facility];  Service: Endoscopy;;   COLONOSCOPY WITH PROPOFOL N/A 08/11/2020   Procedure: COLONOSCOPY WITH PROPOFOL;  Surgeon: Lanelle Bal, DO;  Location: [facility];  Service: Endoscopy;  Laterality: N/A;  7:30am   CORONARY/GRAFT ACUTE MI REVASCULARIZATION N/A 07/16/2021   Procedure: Coronary/Graft Acute MI Revascularization;  Surgeon: Kathleene Hazel, MD;  Location: [facility];  Service: Cardiovascular;  Laterality: N/A;   CYSTOSCOPY WITH RETROGRADE PYELOGRAM, URETEROSCOPY AND STENT  PLACEMENT Right 04/14/2020   Procedure: CYSTOSCOPY WITH RETROGRADE PYELOGRAM, URETEROSCOPY AND STENT PLACEMENT;  Surgeon: Belva Agee, MD;  Location[facility];  Service: Urology;  Laterality: Right;   EXTRACORPOREAL SHOCK WAVE LITHOTRIPSY Right 03/23/2020   Procedure: EXTRACORPOREAL SHOCK WAVE LITHOTRIPSY (ESWL);  Surgeon: Crista Elliot, MD;  Location: [facility];  Service: Urology;  Laterality: Right;   HOLMIUM LASER APPLICATION Right 04/14/2020   Procedure: HOLMIUM LASER APPLICATION;  Surgeon: Belva Agee, MD;  Location: [facility];  Service: Urology;  Laterality: Right;   LAPAROSCOPIC CHOLECYSTECTOMY  2012 approx   LEFT HEART CATH AND CORONARY ANGIOGRAPHY N/A 04/06/2016   Procedure: Left Heart Cath and Coronary Angiography;  Surgeon: Tonny Bollman, MD;  Location: [facility] INVASIVE CV LAB;  Service: Cardiovascular;  Laterality: N/A;   LEFT HEART CATH AND CORONARY ANGIOGRAPHY N/A 07/16/2021   Procedure: LEFT HEART CATH AND CORONARY ANGIOGRAPHY;  Surgeon: Kathleene Hazel, MD;  Location: [facility];  Service: Cardiovascular;  Laterality: N/A;   ORCHIECTOMY Right 1990s   per pt benign cyst   PARTIAL COLECTOMY Right 09/02/2020   Procedure: HEMICOLECTOMY;  Surgeon: Franky Macho, MD;  Location: [facility];  Service: General;  Laterality: Right;   POLYPECTOMY  08/11/2020   Procedure: POLYPECTOMY;  Surgeon: Lanelle Bal, DO;  Location: [facility];  Service: Endoscopy;;   SHOULDER SURGERY Bilateral left 2019;  right 2016  SHOULDER SURGERY Left    SHOULDER SURGERY Right    Patient Active Problem List   Diagnosis Date Noted   Family history of prostate cancer in father 11/04/2022   Hematuria, gross 11/01/2022   Arthralgia 10/28/2022   Anxiety 06/10/2022   Generalized muscle ache 06/10/2022   Nephrolithiasis 04/05/2022   Lesion of left ear 11/05/2021   Tinnitus, bilateral 08/04/2021   Acute MI, 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HCC)     S/P partial colectomy 09/02/2020   Dysplastic colon polyp    Cigarette nicotine dependence without complication 09/26/2017   Chronic right-sided low back pain with right-sided sciatica 09/26/2017   Mixed hyperlipidemia 09/26/2017   CAD (coronary artery disease) 05/03/2016   Tobacco abuse    Prediabetes    Gastroesophageal reflux disease    ACS (acute coronary syndrome) (HCC) 03/31/2016    ONSET DATE: DOS 12/16/22  REFERRING DIAG: E45.409 (ICD-10-CM) - Trigger index finger of left hand   THERAPY DIAG:  Trigger index finger of left hand  Muscle weakness (generalized)  Pain in left hand  Localized edema  Stiffness of left hand, not elsewhere classified  Other lack of coordination  Rationale for Evaluation and Treatment: Rehabilitation  PERTINENT HISTORY: He states Rt hand RF trigger was the "bad one" about 3 months ago, but a cortisone injection seemed to relieve it. He states the injection to Lt IF did not help, it hurt, and now he presents ~2 weeks after sx. 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  He is currently out of work until late December.  He does state a history of inflammation in his body with benefits from receiving steroids. PRECAUTIONS: None;  RED FLAGS: None   WEIGHT BEARING RESTRICTIONS: Yes <5# recommended in Lt hand for next month   SUBJECTIVE:   SUBJECTIVE STATEMENT: 3+ weeks s/p left index trigger release. He states feeling just a swollen and unsure if his home exercises have been helping.  No significant pain at rest however and less pain in the past week.      PAIN:  Are you having pain? Yes: NPRS scale: 0/10 at rest but with movement up to 3-4/10 Pain location: Lt hand IF Pain description: Aching and swollen Aggravating factors: Weightbearing Relieving factors: Rest  FALLS: Has patient fallen in last 6 months? 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  NEXT MD VISIT: 01/27/23   OBJECTIVE: (All objective assessments below are from initial evaluation on: 01/02/23 unless otherwise specified.)   HAND DOMINANCE: LEFT  ADLs: Overall ADLs: States decreased ability to grab, hold household objects, pain and difficulty to open containers, perform FMS tasks (manipulate fasteners on clothing)   FUNCTIONAL OUTCOME MEASURES: Eval: Quick DASH 68% impairment today  (Higher % Score  =  More Impairment)    UPPER EXTREMITY ROM     Shoulder to Wrist AROM Left eval  Forearm supination 76  Forearm pronation  73  Wrist flexion 49  Wrist extension 41  (Blank rows = not tested)   Hand AROM Left eval Lt 01/10/23  Full Fist Ability (or Gap to Distal Palmar Crease) 4cm from tip of MF to [facility] 4.5cm gap  Thumb Opposition  (Kapandji Scale)  5/10 8/10  Index MCP (0-90)  0 - 41 0 - 44  Index PIP (0-100)  (-26) - 54  22 - 71  Index DIP (0-70)  (-  3)- 27  0 - 47  (Blank rows = not tested)   UPPER EXTREMITY MMT:    Eval:  NT at eval due to recent and still healing injuries. Will be tested when appropriate.   MMT Left TBD  Elbow flexion   Elbow extension   Forearm supination   Forearm pronation   Wrist flexion   Wrist extension   Wrist ulnar deviation   Wrist radial deviation   (Blank rows = not tested)  HAND FUNCTION: Eval: Observed weakness in affected Lt hand.  Details will be tested when safe Grip strength Right: 47 lbs, Left: TBD lbs   COORDINATION: Eval: Observed coordination impairments with affected Lt hand. 9 Hole Peg Test Left: 54 sec (25 sec is WFL)   SENSATION: Eval:  Light touch intact today, though diminished around sx area    EDEMA:   01/10/23: 8.8cm circumferentially around Lt IF P1 (compared to 8.4cm in Rt hand); around MCP Js: 23.2cm   Eval: Severely swollen in Lt hand and palm- all fingers today, 9.5cm circumferentially around Lt P1 (compared to 8.4cm in Rt  hand)  COGNITION: Eval: Overall cognitive status: WFL for evaluation today   OBSERVATIONS:   Eval: He has some apparent skin issues with both hands including nails that may have a fungus, are yellowed and curling.  His left hand is extremely swollen and the skin is flaking off.  Fortunately his surgical area does not look infected, no secretions, no smell, however his hand is atypically very swollen after the surgery and all of his fingers even through his wrist.  This is a suspicious symptom that may point to some type of allergy or autoimmune disease.  He was recommended to follow-up with his PCP about possible autoimmune testing if his swelling does not improve in the next 1 to 2 weeks.  He does appear to have some hesitancy and pain when using his hand which could account for some of the swelling as well.  He has a bit of a PIP joint extension lag which OT will monitor and make an orthosis for if necessary.  Lt Dom IF TFR   TODAY'S TREATMENT:  01/10/23: He starts with active range of motion for exercise as well as new measures which does show significant 47 degree improvement in total active motion of the index finger since last week.  No edema measurements also show significant improvements.  OT continues to give self-care education on preventing pain, heavy gripping, repetitive motions.  OT also does manual therapy retrograde massage today with patient education to perform at least 3 times a day himself at home.  His circumferential swelling in the hand improves by 2 mm after this is done.  Next, we review his full home exercise program and OT makes adjustments and upgrades as bolded below.  These new stretches do not cause pain and he demonstrates ability to do himself.  He leaves with no increase in pain and states feeling better.   Exercises - Reach arms upward   - 4 x daily - 10 reps - Wrist Flexion Stretch  - 4 x daily - 3-5 reps - 15 sec hold - Seated Wrist Extension Stretch  - 3-6 x  daily - 3-5 reps - 15 hold - Finger Spreading  - 4-6 x daily - 10-15 reps - Tendon Glides  - 4-6 x daily - 3-5 reps - 2-3 seconds hold - Thumb Opposition  - 4-6 x daily - 10 reps - Tip Joint Blocking Motion  -  4-6 x daily - 10-15 reps - Hand AROM PIP Blocking  - 4-6 x daily - 10-15 reps - PUSH KNUCKLES DOWN  - 4-6 x daily - 3 reps - 15 seconds hold - Hand AROM Reverse Blocking  - 4-6 x daily - 10 reps Patient Education - Scar Massage  PATIENT EDUCATION: Education details: See tx section above for details  Person educated: Patient Education method: Verbal Instruction, Teach back, Handouts  Education comprehension: States and demonstrates understanding, Additional Education required    HOME EXERCISE PROGRAM: Access Code: [email or web address] URL: [email or web address] Date: 01/02/2023 Prepared by: Fannie Knee   GOALS: Goals reviewed with patient? Yes   SHORT TERM GOALS: (STG required if POC>30 days) Target Date: 11/20/22  Pt will obtain protective, custom orthotic. Goal status: TBD/PRN  2.  Pt will demo/state understanding of initial HEP to improve pain levels and prerequisite motion. Goal status: INITIAL   LONG TERM GOALS: Target Date: 02/16/22  Pt will improve functional ability by decreased impairment per Quick DASH assessment from 68% to 18% or better, for better quality of life. Goal status: INITIAL  2.  Pt will improve grip strength in Lt hand from unsafe to at least 40lbs for functional use at home and in IADLs. Goal status: INITIAL  3.  Pt will improve A/ROM in Lt wrist flex/ext from 49/41 to at least 60* each, to have functional motion for tasks like reach and grasp.  Goal status: INITIAL  4. Pt will improve coordination skills in Lt dom hand, as seen by [facility] score on nine-hole peg testing to have increased functional ability to carry out fine motor tasks (fasteners, etc.) and more complex, coordinated IADLs (meal prep, sports, etc.).  Goal status:  INITIAL  6.  Pt will decrease pain at worst from 5-6/10 to 1/10 or better to have better sleep and occupational participation in daily roles. Goal status: INITIAL    ASSESSMENT:  CLINICAL IMPRESSION: 01/10/23: He has significantly improved his range of motion in the index finger from 93 degrees to 140 degrees.  The swelling is also down almost a full centimeter.  Continue on as he will continue to benefit from outpatient occupational therapy   PLAN:  OT FREQUENCY: 1-2x/week  OT DURATION: 6 weeks through 02/17/2023 and up to 10 total visits as medically needed  PLANNED INTERVENTIONS: 97168 OT Re-evaluation, 97535 self care/ADL training, 72536 therapeutic exercise, 97530 therapeutic activity, 97112 neuromuscular re-education, 97140 manual therapy, 97035 ultrasound, 97039 fluidotherapy, 97010 moist heat, 97010 cryotherapy, 97034 contrast bath, 97760 Orthotics management and training, 64403 Splinting (initial encounter), [id number] Subsequent splinting/medication, scar mobilization, compression bandaging, Dry needling, coping strategies training, and patient/family education  CONSULTED AND AGREED WITH PLAN OF CARE: Patient  PLAN FOR NEXT SESSION:   Review new stretches and upgrade to light finger stretches as tolerated including the prayer stretch for wrist extension   Fannie Knee, OTR/L, CHT 01/10/2023, 1:24 PM

## 2023-01-10 ENCOUNTER — Ambulatory Visit (INDEPENDENT_AMBULATORY_CARE_PROVIDER_SITE_OTHER): Payer: Managed Care, Other (non HMO) | Admitting: Rehabilitative and Restorative Service Providers"

## 2023-01-10 ENCOUNTER — Encounter: Payer: Self-pay | Admitting: Rehabilitative and Restorative Service Providers"

## 2023-01-10 DIAGNOSIS — M6281 Muscle weakness (generalized): Secondary | ICD-10-CM

## 2023-01-10 DIAGNOSIS — R600 Localized edema: Secondary | ICD-10-CM

## 2023-01-10 DIAGNOSIS — M65322 Trigger finger, left index finger: Secondary | ICD-10-CM | POA: Diagnosis not present

## 2023-01-10 DIAGNOSIS — M79642 Pain in left hand: Secondary | ICD-10-CM | POA: Diagnosis not present

## 2023-01-10 DIAGNOSIS — R278 Other lack of coordination: Secondary | ICD-10-CM

## 2023-01-10 DIAGNOSIS — M25642 Stiffness of left hand, not elsewhere classified: Secondary | ICD-10-CM

## 2023-01-11 NOTE — Therapy (Incomplete)
OUTPATIENT OCCUPATIONAL THERAPY TREATMENT NOTE  Patient Name: Derrick Turner MRN: [id number] DOB:30-Nov-1970, 52 y.o., male Today's Date: 01/11/2023  PCP: Gilmore Laroche, FNP REFERRING PROVIDER:  Samuella Cota, MD    END OF SESSION:     Past Medical History:  Diagnosis Date   Coronary artery disease cardiologist--- dr croitoru   ETT 04-01-2016 in epic, reproduced chest pain but no EKG changes;  cardiac cath 04-06-2016 moderate diffuse nonobstructive disease, aggressive medical management   History of 2019 novel coronavirus disease (COVID-19) 03/02/2020   per pt tested positive covid, copy of result in epic, stated mild symptoms that resolved   History of kidney stones    Incomplete right bundle branch block    Right ureteral calculus    Wears glasses    Past Surgical History:  Procedure Laterality Date   APPENDECTOMY  teen   BACK SURGERY     two   BIOPSY  08/11/2020   Procedure: BIOPSY;  Surgeon: Lanelle Bal, DO;  Location: [facility];  Service: Endoscopy;;   COLONOSCOPY WITH PROPOFOL N/A 08/11/2020   Procedure: COLONOSCOPY WITH PROPOFOL;  Surgeon: Lanelle Bal, DO;  Location: [facility];  Service: Endoscopy;  Laterality: N/A;  7:30am   CORONARY/GRAFT ACUTE MI REVASCULARIZATION N/A 07/16/2021   Procedure: Coronary/Graft Acute MI Revascularization;  Surgeon: Kathleene Hazel, MD;  Location: [facility];  Service: Cardiovascular;  Laterality: N/A;   CYSTOSCOPY WITH RETROGRADE PYELOGRAM, URETEROSCOPY AND STENT PLACEMENT Right 04/14/2020   Procedure: CYSTOSCOPY WITH RETROGRADE PYELOGRAM, URETEROSCOPY AND STENT PLACEMENT;  Surgeon: Belva Agee, MD;  Location: [facility];  Service: Urology;  Laterality: Right;   EXTRACORPOREAL SHOCK WAVE LITHOTRIPSY Right 03/23/2020   Procedure: EXTRACORPOREAL SHOCK WAVE LITHOTRIPSY (ESWL);  Surgeon: Crista Elliot, MD;  Location: [facility];  Service: Urology;  Laterality:  Right;   HOLMIUM LASER APPLICATION Right 04/14/2020   Procedure: HOLMIUM LASER APPLICATION;  Surgeon: Belva Agee, MD;  Location[facility];  Service: Urology;  Laterality: Right;   LAPAROSCOPIC CHOLECYSTECTOMY  2012 approx   LEFT HEART CATH AND CORONARY ANGIOGRAPHY N/A 04/06/2016   Procedure: Left Heart Cath and Coronary Angiography;  Surgeon: Tonny Bollman, MD;  Location: [facility];  Service: Cardiovascular;  Laterality: N/A;   LEFT HEART CATH AND CORONARY ANGIOGRAPHY N/A 07/16/2021   Procedure: LEFT HEART CATH AND CORONARY ANGIOGRAPHY;  Surgeon: Kathleene Hazel, MD;  Location: [facility];  Service: Cardiovascular;  Laterality: N/A;   ORCHIECTOMY Right 1990s   per pt benign cyst   PARTIAL COLECTOMY Right 09/02/2020   Procedure: HEMICOLECTOMY;  Surgeon: Franky Macho, MD;  Location: [facility];  Service: General;  Laterality: Right;   POLYPECTOMY  08/11/2020   Procedure: POLYPECTOMY;  Surgeon: Lanelle Bal, DO;  Location: [facility];  Service: Endoscopy;;   SHOULDER SURGERY Bilateral left 2019;  right 2016   SHOULDER SURGERY Left    SHOULDER SURGERY Right    Patient Active Problem List   Diagnosis Date Noted   Family history of prostate cancer in father 11/04/2022   Hematuria, gross 11/01/2022   Arthralgia 10/28/2022   Anxiety 06/10/2022   Generalized muscle ache 06/10/2022   Nephrolithiasis 04/05/2022   Lesion of left ear 11/05/2021   Tinnitus, bilateral 08/04/2021   Acute MI, 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HCC)    S/P partial colectomy 09/02/2020   Dysplastic colon polyp    Cigarette nicotine dependence without complication 09/26/2017   Chronic right-sided  low back pain with right-sided sciatica 09/26/2017   Mixed hyperlipidemia 09/26/2017   CAD (coronary artery disease) 05/03/2016   Tobacco abuse    Prediabetes    Gastroesophageal reflux disease    ACS (acute coronary syndrome) (HCC) 03/31/2016    ONSET DATE: DOS 12/16/22  REFERRING DIAG:  H84.696 (ICD-10-CM) - Trigger index finger of left hand   THERAPY DIAG:  No diagnosis found.  Rationale for Evaluation and Treatment: Rehabilitation  PERTINENT HISTORY: He states Rt hand RF trigger was the "bad one" about 3 months ago, but a cortisone injection seemed to relieve it. He states the injection to Lt IF did not help, it hurt, and now he presents ~2 weeks after sx. 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  He is currently out of work until late December.  He does state a history of inflammation in his body with benefits from receiving steroids. PRECAUTIONS: None;  RED FLAGS: None   WEIGHT BEARING RESTRICTIONS: Yes <5# recommended in Lt hand for next month   SUBJECTIVE:   SUBJECTIVE STATEMENT: 4 weeks s/p left index trigger release. He states ***    PAIN:  Are you having pain? Yes: NPRS scale: *** 0/10 at rest but with movement up to 3-4/10 Pain location: Lt hand IF Pain description: Aching and swollen Aggravating factors: Weightbearing Relieving factors: Rest  FALLS: Has patient fallen in last 6 months? 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  NEXT MD VISIT: 01/27/23   OBJECTIVE: (All objective assessments below are from initial evaluation on: 01/02/23 unless otherwise specified.)   HAND DOMINANCE: LEFT  ADLs: Overall ADLs: States decreased ability to grab, hold household objects, pain and difficulty to open containers, perform FMS tasks (manipulate fasteners on clothing)   FUNCTIONAL OUTCOME MEASURES: Eval: Quick DASH 68% impairment today  (Higher % Score  =  More Impairment)    UPPER EXTREMITY ROM     Shoulder to Wrist AROM Left eval  Forearm supination 76  Forearm pronation  73  Wrist flexion 49  Wrist extension 41  (Blank rows = not tested)   Hand AROM Left eval Lt 01/10/23 Lt 01/12/23  Full Fist Ability  (or Gap to Distal Palmar Crease) 4cm from tip of MF to [facility] 4.5cm gap ***cm gap  Thumb Opposition  (Kapandji Scale)  5/10 8/10   Index MCP (0-90)  0 - 41 0 - 44   Index PIP (0-100)  (-26) - 54  22 - 71   Index DIP (0-70)  (-3)- 27  0 - 47   (Blank rows = not tested)   UPPER EXTREMITY MMT:    Eval:  NT at eval due to recent and still healing injuries. Will be tested when appropriate.   MMT Left TBD  Elbow flexion   Elbow extension   Forearm supination   Forearm pronation   Wrist flexion   Wrist extension   Wrist ulnar deviation   Wrist radial deviation   (Blank rows = not tested)  HAND FUNCTION: Eval: Observed weakness in affected Lt hand.  Details will be tested when safe Grip strength Right: 47 lbs, Left: TBD lbs   COORDINATION: Eval: Observed coordination impairments with affected Lt hand. 9 Hole Peg Test Left: 54 sec (25 sec is WFL)   SENSATION: Eval:  Light touch intact today, though diminished around sx area  EDEMA:    01/12/23: ***cm circumferentially around Lt IF P1 (compared to 8.4cm in Rt hand); around MCP Js: ***cm   01/10/23: 8.8cm circumferentially around Lt IF P1 (compared to 8.4cm in Rt hand); around MCP Js: 23.2cm   Eval: Severely swollen in Lt hand and palm- all fingers today, 9.5cm circumferentially around Lt P1 (compared to 8.4cm in Rt hand)  COGNITION: Eval: Overall cognitive status: WFL for evaluation today   OBSERVATIONS:   Eval: He has some apparent skin issues with both hands including nails that may have a fungus, are yellowed and curling.  His left hand is extremely swollen and the skin is flaking off.  Fortunately his surgical area does not look infected, no secretions, no smell, however his hand is atypically very swollen after the surgery and all of his fingers even through his wrist.  This is a suspicious symptom that may point to some type of allergy or autoimmune disease.  He was recommended to follow-up with his PCP about possible  autoimmune testing if his swelling does not improve in the next 1 to 2 weeks.  He does appear to have some hesitancy and pain when using his hand which could account for some of the swelling as well.  He has a bit of a PIP joint extension lag which OT will monitor and make an orthosis for if necessary.  Lt Dom IF TFR   TODAY'S TREATMENT:  01/12/23: *** Review new stretches and upgrade to light finger stretches as tolerated including the prayer stretch for wrist extension  Exercises - Reach arms upward   - 4 x daily - 10 reps - Wrist Flexion Stretch  - 4 x daily - 3-5 reps - 15 sec hold - Seated Wrist Extension Stretch  - 3-6 x daily - 3-5 reps - 15 hold - Finger Spreading  - 4-6 x daily - 10-15 reps - Tendon Glides  - 4-6 x daily - 3-5 reps - 2-3 seconds hold - Thumb Opposition  - 4-6 x daily - 10 reps - Tip Joint Blocking Motion  - 4-6 x daily - 10-15 reps - Hand AROM PIP Blocking  - 4-6 x daily - 10-15 reps - PUSH KNUCKLES DOWN  - 4-6 x daily - 3 reps - 15 seconds hold - Hand AROM Reverse Blocking  - 4-6 x daily - 10 reps Patient Education - Scar Massage   PATIENT EDUCATION: Education details: See tx section above for details  Person educated: Patient Education method: Verbal Instruction, Teach back, Handouts  Education comprehension: States and demonstrates understanding, Additional Education required    HOME EXERCISE PROGRAM: Access Code: [email or web address] URL: [email or web address] Date: 01/02/2023 Prepared by: Fannie Knee   GOALS: Goals reviewed with patient? Yes   SHORT TERM GOALS: (STG required if POC>30 days) Target Date: 11/20/22  Pt will obtain protective, custom orthotic. Goal status: TBD/PRN  2.  Pt will demo/state understanding of initial HEP to improve pain levels and prerequisite motion. Goal status: 01/12/23: MET   LONG TERM GOALS: Target Date: 02/16/22  Pt will improve functional ability by decreased impairment per Quick DASH assessment  from 68% to 18% or better, for better quality of life. Goal status: INITIAL  2.  Pt will improve grip strength in Lt hand from unsafe to at least 40lbs for functional use at home and in IADLs. Goal status: INITIAL  3.  Pt will improve A/ROM in Lt wrist flex/ext from 49/41 to at least 60* each, to have functional motion for tasks like  reach and grasp.  Goal status: INITIAL  4. Pt will improve coordination skills in Lt dom hand, as seen by [facility] score on nine-hole peg testing to have increased functional ability to carry out fine motor tasks (fasteners, etc.) and more complex, coordinated IADLs (meal prep, sports, etc.).  Goal status: INITIAL  6.  Pt will decrease pain at worst from 5-6/10 to 1/10 or better to have better sleep and occupational participation in daily roles. Goal status: INITIAL    ASSESSMENT:  CLINICAL IMPRESSION: 01/12/23: ***  01/10/23: He has significantly improved his range of motion in the index finger from 93 degrees to 140 degrees.  The swelling is also down almost a full centimeter.  Continue on as he will continue to benefit from outpatient occupational therapy   PLAN:  OT FREQUENCY: 1-2x/week  OT DURATION: 6 weeks through 02/17/2023 and up to 10 total visits as medically needed  PLANNED INTERVENTIONS: 97168 OT Re-evaluation, 97535 self care/ADL training, 69629 therapeutic exercise, 97530 therapeutic activity, 97112 neuromuscular re-education, 97140 manual therapy, 97035 ultrasound, 97039 fluidotherapy, 97010 moist heat, 97010 cryotherapy, 97034 contrast bath, 97760 Orthotics management and training, 52841 Splinting (initial encounter), [id number] Subsequent splinting/medication, scar mobilization, compression bandaging, Dry needling, coping strategies training, and patient/family education  CONSULTED AND AGREED WITH PLAN OF CARE: Patient  PLAN FOR NEXT SESSION:   ***   Fannie Knee, OTR/L, CHT 01/11/2023, 11:50 AM

## 2023-01-12 ENCOUNTER — Encounter: Payer: Managed Care, Other (non HMO) | Admitting: Rehabilitative and Restorative Service Providers"

## 2023-01-17 ENCOUNTER — Other Ambulatory Visit: Payer: Self-pay | Admitting: Family Medicine

## 2023-01-17 ENCOUNTER — Encounter: Payer: Self-pay | Admitting: Rehabilitative and Restorative Service Providers"

## 2023-01-17 ENCOUNTER — Ambulatory Visit (INDEPENDENT_AMBULATORY_CARE_PROVIDER_SITE_OTHER): Payer: Managed Care, Other (non HMO) | Admitting: Rehabilitative and Restorative Service Providers"

## 2023-01-17 DIAGNOSIS — M65322 Trigger finger, left index finger: Secondary | ICD-10-CM

## 2023-01-17 DIAGNOSIS — M79642 Pain in left hand: Secondary | ICD-10-CM | POA: Diagnosis not present

## 2023-01-17 DIAGNOSIS — M2540 Effusion, unspecified joint: Secondary | ICD-10-CM

## 2023-01-17 DIAGNOSIS — M6281 Muscle weakness (generalized): Secondary | ICD-10-CM | POA: Diagnosis not present

## 2023-01-17 NOTE — Therapy (Addendum)
OUTPATIENT OCCUPATIONAL THERAPY TREATMENT NOTE  Patient Name: Derrick Turner MRN: [id number] DOB:09-10-1970, 52 y.o., male Today's Date: 01/17/2023  PCP: Gilmore Laroche, FNP REFERRING PROVIDER:  Samuella Cota, MD    END OF SESSION:  OT End of Session - 01/17/23 1112     Visit Number 3    Number of Visits 10    Date for OT Re-Evaluation 02/17/23    Authorization Type [employer]    OT Start Time 1111    OT Stop Time 1150    OT Time Calculation (min) 39 min    Activity Tolerance Patient tolerated treatment well;No increased pain;Patient limited by fatigue    Behavior During Therapy [facility] for tasks assessed/performed               Past Medical History:  Diagnosis Date   Coronary artery disease cardiologist--- dr croitoru   ETT 04-01-2016 in epic, reproduced chest pain but no EKG changes;  cardiac cath 04-06-2016 moderate diffuse nonobstructive disease, aggressive medical management   History of 2019 novel coronavirus disease (COVID-19) 03/02/2020   per pt tested positive covid, copy of result in epic, stated mild symptoms that resolved   History of kidney stones    Incomplete right bundle branch block    Right ureteral calculus    Wears glasses    Past Surgical History:  Procedure Laterality Date   APPENDECTOMY  teen   BACK SURGERY     two   BIOPSY  08/11/2020   Procedure: BIOPSY;  Surgeon: Lanelle Bal, DO;  Location: [facility];  Service: Endoscopy;;   COLONOSCOPY WITH PROPOFOL N/A 08/11/2020   Procedure: COLONOSCOPY WITH PROPOFOL;  Surgeon: Lanelle Bal, DO;  Location: [facility];  Service: Endoscopy;  Laterality: N/A;  7:30am   CORONARY/GRAFT ACUTE MI REVASCULARIZATION N/A 07/16/2021   Procedure: Coronary/Graft Acute MI Revascularization;  Surgeon: Kathleene Hazel, MD;  Location: [facility];  Service: Cardiovascular;  Laterality: N/A;   CYSTOSCOPY WITH RETROGRADE PYELOGRAM, URETEROSCOPY AND STENT PLACEMENT Right 04/14/2020   Procedure:  CYSTOSCOPY WITH RETROGRADE PYELOGRAM, URETEROSCOPY AND STENT PLACEMENT;  Surgeon: Belva Agee, MD;  Location: [facility];  Service: Urology;  Laterality: Right;   EXTRACORPOREAL SHOCK WAVE LITHOTRIPSY Right 03/23/2020   Procedure: EXTRACORPOREAL SHOCK WAVE LITHOTRIPSY (ESWL);  Surgeon: Crista Elliot, MD;  Location: [facility];  Service: Urology;  Laterality: Right;   HOLMIUM LASER APPLICATION Right 04/14/2020   Procedure: HOLMIUM LASER APPLICATION;  Surgeon: Belva Agee, MD;  Location: [facility];  Service: Urology;  Laterality: Right;   LAPAROSCOPIC CHOLECYSTECTOMY  2012 approx   LEFT HEART CATH AND CORONARY ANGIOGRAPHY N/A 04/06/2016   Procedure: Left Heart Cath and Coronary Angiography;  Surgeon: Tonny Bollman, MD;  Location: [facility] INVASIVE CV LAB;  Service: Cardiovascular;  Laterality: N/A;   LEFT HEART CATH AND CORONARY ANGIOGRAPHY N/A 07/16/2021   Procedure: LEFT HEART CATH AND CORONARY ANGIOGRAPHY;  Surgeon: Kathleene Hazel, MD;  Location: [facility];  Service: Cardiovascular;  Laterality: N/A;   ORCHIECTOMY Right 1990s   per pt benign cyst   PARTIAL COLECTOMY Right 09/02/2020   Procedure: HEMICOLECTOMY;  Surgeon: Franky Macho, MD;  Location: [facility];  Service: General;  Laterality: Right;   POLYPECTOMY  08/11/2020   Procedure: POLYPECTOMY;  Surgeon: Lanelle Bal, DO;  [facility]: [facility];  Service: Endoscopy;;   SHOULDER SURGERY Bilateral left 2019;  right 2016   SHOULDER SURGERY Left  SHOULDER SURGERY Right    Patient Active Problem List   Diagnosis Date Noted   Family history of prostate cancer in father 11/04/2022   Hematuria, gross 11/01/2022   Arthralgia 10/28/2022   Anxiety 06/10/2022   Generalized muscle ache 06/10/2022   Nephrolithiasis 04/05/2022   Lesion of left ear 11/05/2021   Tinnitus, bilateral 08/04/2021   Acute MI, 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HCC)    S/P partial colectomy 09/02/2020    Dysplastic colon polyp    Cigarette nicotine dependence without complication 09/26/2017   Chronic right-sided low back pain with right-sided sciatica 09/26/2017   Mixed hyperlipidemia 09/26/2017   CAD (coronary artery disease) 05/03/2016   Tobacco abuse    Prediabetes    Gastroesophageal reflux disease    ACS (acute coronary syndrome) (HCC) 03/31/2016    ONSET DATE: DOS 12/16/22  REFERRING DIAG: U27.253 (ICD-10-CM) - Trigger index finger of left hand   THERAPY DIAG:  Trigger index finger of left hand  Muscle weakness (generalized)  Pain in left hand  Rationale for Evaluation and Treatment: Rehabilitation  PERTINENT HISTORY: He states Rt hand RF trigger was the "bad one" about 3 months ago, but a cortisone injection seemed to relieve it. He states the injection to Lt IF did not help, it hurt, and now he presents ~2 weeks after sx. 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  He is currently out of work until late December.  He does state a history of inflammation in his body with benefits from receiving steroids. PRECAUTIONS: None;  RED FLAGS: None   WEIGHT BEARING RESTRICTIONS: Yes <5# recommended in Lt hand for next month   SUBJECTIVE:   SUBJECTIVE STATEMENT: 4+ weeks s/p left index trigger release. He states not doing anything painful, but continue with active range of motion, retrograde massage, wearing edema glove-though he feels no better and actually his whole body body feels more stiff and more fatigue.    PAIN:  Are you having pain? Yes: NPRS scale: 0/10 at rest but with movement up to 4/10 Pain location: Lt hand IF Pain description: Aching and swollen Aggravating factors: Weightbearing Relieving factors: Rest  FALLS: Has patient fallen in last 6 months? 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  NEXT MD VISIT:  01/27/23   OBJECTIVE: (All objective assessments below are from initial evaluation on: 01/02/23 unless otherwise specified.)   HAND DOMINANCE: LEFT  ADLs: Overall ADLs: States decreased ability to grab, hold household objects, pain and difficulty to open containers, perform FMS tasks (manipulate fasteners on clothing)   FUNCTIONAL OUTCOME MEASURES: Eval: Quick DASH 68% impairment today  (Higher % Score  =  More Impairment)    UPPER EXTREMITY ROM     Shoulder to Wrist AROM Left eval Lt 01/17/23  Forearm supination 76   Forearm pronation  73   Wrist flexion 49 44  Wrist extension 41 37  (Blank rows = not tested)   Hand AROM Left eval Lt 01/10/23 Lt 01/17/23  Full Fist Ability (or Gap to Distal Palmar Crease) 4cm from tip of MF to [facility] 4.5cm gap 2.8cm gap  Thumb Opposition  (Kapandji Scale)  5/10 8/10   Index MCP (0-90)  0 - 41 0 - 44 0-54  Index PIP (0-100)  (-26) - 54  (-22) - 71 (-26) -61  Index DIP (0-70)  (-3)- 27  0 -  47 0-47  (Blank rows = not tested)   UPPER EXTREMITY MMT:    Eval:  NT at eval due to recent and still healing injuries. Will be tested when appropriate.   MMT Left TBD  Elbow flexion   Elbow extension   Forearm supination   Forearm pronation   Wrist flexion   Wrist extension   Wrist ulnar deviation   Wrist radial deviation   (Blank rows = not tested)  HAND FUNCTION: Eval: Observed weakness in affected Lt hand.  Details will be tested when safe Grip strength Right: 47 lbs, Left: TBD lbs   COORDINATION: Eval: Observed coordination impairments with affected Lt hand. 9 Hole Peg Test Left: 54 sec (25 sec is WFL)   SENSATION: Eval:  Light touch intact today, though diminished around sx area    EDEMA:    01/17/23: 8.8cm circumferentially around Lt IF P1 (compared to 8.4cm in Rt hand); around MCP Js: 23.3cm   01/10/23: 8.8cm circumferentially around Lt IF P1 (compared to 8.4cm in Rt hand); around MCP Js: 23.2cm   Eval: Severely swollen in  Lt hand and palm- all fingers today, 9.5cm circumferentially around Lt P1 (compared to 8.4cm in Rt hand)  COGNITION: Eval: Overall cognitive status: WFL for evaluation today   OBSERVATIONS:   Eval: He has some apparent skin issues with both hands including nails that may have a fungus, are yellowed and curling.  His left hand is extremely swollen and the skin is flaking off.  Fortunately his surgical area does not look infected, no secretions, no smell, however his hand is atypically very swollen after the surgery and all of his fingers even through his wrist.  This is a suspicious symptom that may point to some type of allergy or autoimmune disease.  He was recommended to follow-up with his PCP about possible autoimmune testing if his swelling does not improve in the next 1 to 2 weeks.  He does appear to have some hesitancy and pain when using his hand which could account for some of the swelling as well.  He has a bit of a PIP joint extension lag which OT will monitor and make an orthosis for if necessary.  Lt Dom IF TFR   TODAY'S TREATMENT:  01/17/23: New swelling measurements today show no significant improvement in swelling despite using therapy techniques and having more time since surgery.  His whole exercise program was reviewed today and he was upgraded to finger stretches as tolerated, though this will not help with swelling and so he was encouraged to continue to do retrograde massages, hold hand above heart and do tendon glides, etc.  OT also trials contrast bath with him today while he is performing tendon glides and active range of motion for exercise.  This did not significantly improve his swelling but it did make him feel less stiff and looser at the end.  He states understanding that he needs to go see his PCP to have autoimmune testing done and that we will withhold therapy until swelling improves  Exercises - Reach arms upward   - 4 x daily - 10 reps - Wrist Flexion Stretch  - 4  x daily - 3-5 reps - 15 sec hold - Seated Wrist Extension Stretch  - 3-6 x daily - 3-5 reps - 15 hold - Finger Spreading  - 4-6 x daily - 10-15 reps - Tendon Glides  - 4-6 x daily - 3-5 reps - 2-3 seconds hold - Thumb Opposition  -  4-6 x daily - 10 reps - BACK KNUCKLE STRETCHES   - 4 x daily - 3-5 reps - 15 sec hold - HOOK Stretch  - 4 x daily - 3-5 reps - 15-20 sec hold - PUSH KNUCKLES DOWN  - 4 x daily - 3-5 reps - 15 seconds hold - Thumb stretch  - 4 x daily - 3-5 reps - 15-20 sec hold   PATIENT EDUCATION: Education details: See tx section above for details  Person educated: Patient Education method: Verbal Instruction, Teach back, Handouts  Education comprehension: States and demonstrates understanding, Additional Education required    HOME EXERCISE PROGRAM: Access Code: [email or web address] URL: [email or web address] Date: 01/02/2023 Prepared by: Fannie Knee   GOALS: Goals reviewed with patient? Yes   SHORT TERM GOALS: (STG required if POC>30 days) Target Date: 11/20/22  Pt will obtain protective, custom orthotic. Goal status: TBD/PRN  2.  Pt will demo/state understanding of initial HEP to improve pain levels and prerequisite motion. Goal status: 01/12/23: MET   LONG TERM GOALS: Target Date: 02/16/22  Pt will improve functional ability by decreased impairment per Quick DASH assessment from 68% to 18% or better, for better quality of life. Goal status: INITIAL  2.  Pt will improve grip strength in Lt hand from unsafe to at least 40lbs for functional use at home and in IADLs. Goal status: INITIAL  3.  Pt will improve A/ROM in Lt wrist flex/ext from 49/41 to at least 60* each, to have functional motion for tasks like reach and grasp.  Goal status: INITIAL  4. Pt will improve coordination skills in Lt dom hand, as seen by [facility] score on nine-hole peg testing to have increased functional ability to carry out fine motor tasks (fasteners, etc.) and more complex,  coordinated IADLs (meal prep, sports, etc.).  Goal status: INITIAL  6.  Pt will decrease pain at worst from 5-6/10 to 1/10 or better to have better sleep and occupational participation in daily roles. Goal status: INITIAL    ASSESSMENT:  CLINICAL IMPRESSION: 01/17/23: Unfortunately he remains very swollen throughout his whole left hand including his wrist and thumb.  This is outside of normal limits over 4 weeks from surgery and so he was recommended to go to his PCP or neurologist ASAP to have some testing done for possible autoimmune disorders.  Additionally all of his joints in his body are swollen and aching and he is limping significantly.  We will put therapy on hold for several weeks until some of this gives clarification.  He has a home exercise program and can continue it as helpful until seen again   PLAN:  OT FREQUENCY: 1-2x/week  OT DURATION: 6 weeks through 02/17/2023 and up to 10 total visits as medically needed  PLANNED INTERVENTIONS: 97168 OT Re-evaluation, 97535 self care/ADL training, 64403 therapeutic exercise, 97530 therapeutic activity, 97112 neuromuscular re-education, 97140 manual therapy, 97035 ultrasound, 97039 fluidotherapy, 97010 moist heat, 97010 cryotherapy, 97034 contrast bath, 97760 Orthotics management and training, 47425 Splinting (initial encounter), [id number] Subsequent splinting/medication, scar mobilization, compression bandaging, Dry needling, coping strategies training, and patient/family education  CONSULTED AND AGREED WITH PLAN OF CARE: Patient  PLAN FOR NEXT SESSION:   Withhold therapy for at least 1 or 2 weeks until he can have some autoimmune testing done   [employer], OTR/L, CHT 01/17/2023, 1:24 PM

## 2023-01-18 LAB — ANA: Anti Nuclear Antibody (ANA): POSITIVE — AB

## 2023-01-18 LAB — RHEUMATOID FACTOR: Rheumatoid fact SerPl-aCnc: <10 [IU]/mL (ref <14.0)

## 2023-01-24 ENCOUNTER — Encounter: Payer: Managed Care, Other (non HMO) | Admitting: Rehabilitative and Restorative Service Providers"

## 2023-01-26 NOTE — Patient Instructions (Signed)

## 2023-01-26 NOTE — Progress Notes (Signed)
Established Patient Office Visit   Subjective  Patient ID: Derrick Turner, male    DOB: [birth year]  Age: 52 y.o. MRN: [id number]  Chief Complaint  Patient presents with   Acute Visit    Inflammation in hand. Had surgery 6 weeks ago and swelling has not gone down,.     He  has a past medical history of Coronary artery disease (cardiologist--- dr Royann Shivers), History of 2019 novel coronavirus disease (COVID-19) (03/02/2020), History of kidney stones, Incomplete right bundle branch block, Right ureteral calculus, and Wears glasses.  Patient reports right-hand swelling and  pain that began 6 weeks ago following hand surgery on the same hand. The pain has been fluctuating in intensity since the onset and is accompanied by swelling and numbness in the affected hand. The patient denies any associated symptoms such as chest pain. The pain is aggravated by movement, which limits the functionality of the hand during daily activities.To address the symptoms, the patient has attempted rest as a form of self-care, but this has provided no relief. The persistent nature of the pain, combined with swelling and numbness, warrants further evaluation to determine the underlying cause and develop an appropriate treatment plan.    Review of Systems  Constitutional:  Negative for chills and fever.  Respiratory:  Negative for shortness of breath.   Musculoskeletal:  Positive for joint pain and myalgias.  Neurological:  Negative for dizziness and headaches.      Objective:     BP 118/74   Pulse 87   Ht 5\' 10"  (1.778 m)   Wt 227 lb 1.9 oz (103 kg)   SpO2 92%   BMI 32.59 kg/m  BP Readings from Last 3 Encounters:  01/27/23 118/74  11/18/22 137/89  11/08/22 111/88      Physical Exam Vitals reviewed.  Constitutional:      General: He is not in acute distress.    Appearance: Normal appearance. He is not ill-appearing, toxic-appearing or diaphoretic.  HENT:     Head: Normocephalic.  Eyes:      General:        Right eye: No discharge.        Left eye: No discharge.     Conjunctiva/sclera: Conjunctivae normal.  Cardiovascular:     Rate and Rhythm: Normal rate.     Pulses: Normal pulses.     Heart sounds: Normal heart sounds.  Pulmonary:     Effort: Pulmonary effort is normal. No respiratory distress.     Breath sounds: Normal breath sounds.  Musculoskeletal:     Right hand: Swelling present. No tenderness. Normal range of motion.     Left hand: Swelling and tenderness present. Decreased range of motion. Decreased strength.  Skin:    General: Skin is warm and dry.     Capillary Refill: Capillary refill takes less than 2 seconds.  Neurological:     Mental Status: He is alert.     Coordination: Coordination normal.     Gait: Gait normal.  Psychiatric:        Mood and Affect: Mood normal.        Behavior: Behavior normal.      No results found for any visits on 01/27/23.  The ASCVD Risk score (Arnett DK, et al., 2019) failed to calculate for the following reasons:   Risk score cannot be calculated because patient has a medical history suggesting prior/existing ASCVD    Assessment & Plan:  ANA positive -     Ambulatory  referral to Rheumatology  Right hand pain Assessment & Plan: Started Prednisone Taper Advise to use a supportive brace to stabilize the affected area and reduce strain during activities. Rest the hand as much as possible, avoid movements that aggravate the pain, and apply ice to reduce swelling. If symptoms persist follow up with PCP or surgery team   Other orders -     predniSONE; Take as directed  Dispense: 21 tablet; Refill: 0    Return if symptoms worsen or fail to improve.   Cruzita Lederer Newman Nip, FNP

## 2023-01-27 ENCOUNTER — Ambulatory Visit (INDEPENDENT_AMBULATORY_CARE_PROVIDER_SITE_OTHER): Payer: Managed Care, Other (non HMO) | Admitting: Orthopedic Surgery

## 2023-01-27 ENCOUNTER — Encounter: Payer: Self-pay | Admitting: Family Medicine

## 2023-01-27 ENCOUNTER — Ambulatory Visit: Payer: Managed Care, Other (non HMO) | Admitting: Family Medicine

## 2023-01-27 VITALS — BP 118/74 | HR 87 | Ht 70.0 in | Wt 227.1 lb

## 2023-01-27 DIAGNOSIS — M79641 Pain in right hand: Secondary | ICD-10-CM | POA: Insufficient documentation

## 2023-01-27 DIAGNOSIS — Z9889 Other specified postprocedural states: Secondary | ICD-10-CM

## 2023-01-27 DIAGNOSIS — R768 Other specified abnormal immunological findings in serum: Secondary | ICD-10-CM | POA: Diagnosis not present

## 2023-01-27 MED ORDER — PREDNISONE 10 MG (21) PO TBPK: ORAL_TABLET | ORAL | 0 refills | Status: DC

## 2023-01-27 NOTE — Assessment & Plan Note (Addendum)
Started Prednisone Taper Advise to use a supportive brace to stabilize the affected area and reduce strain during activities. Rest the hand as much as possible, avoid movements that aggravate the pain, and apply ice to reduce swelling. If symptoms persist follow up with PCP or surgery team

## 2023-01-27 NOTE — Progress Notes (Unsigned)
   Derrick Turner - 52 y.o. male MRN [id number]  Date of birth: 07-01-1970  Office Visit Note: Visit Date: 01/27/2023 PCP: Gilmore Laroche, FNP Referred by: Gilmore Laroche, FNP  Subjective:  HPI: Derrick Turner is a 52 y.o. male who presents today for follow up 6 weeks status post left index trigger release.  Pertinent ROS were reviewed with the patient and found to be negative unless otherwise specified above in HPI.   Assessment & Plan: Visit Diagnoses: No diagnosis found.  Plan: ***  Follow-up: No follow-ups on file.   Meds & Orders: No orders of the defined types were placed in this encounter.  No orders of the defined types were placed in this encounter.    Procedures: No procedures performed       Objective:   Vital Signs: There were no vitals taken for this visit.  Ortho Exam ***  Imaging: No results found.   Vinicius Brockman Trevor Mace, M.D. [facility] 10:50 AM

## 2023-01-27 NOTE — Therapy (Signed)
OUTPATIENT OCCUPATIONAL THERAPY TREATMENT & DISCHARGE  NOTE  Patient Name: Derrick Turner MRN: [id number] DOB:Jul 08, 1970, 52 y.o., male Today's Date: 02/06/2023  PCP: Gilmore Laroche, FNP REFERRING PROVIDER:  Samuella Cota, MD     OCCUPATIONAL THERAPY DISCHARGE SUMMARY  Visits from Start of Care: 4  CLINICAL IMPRESSION: 02/06/23: Though his wrist is still somewhat tight and he has some swelling lingering, he has met all of his other long-term goals and is doing much better.  Additionally he was positive for ANA testing indicating likely autoimmune disease.  OT believes he has maximized his potential through conservative treatment and now should focus on getting the right medicines to help treat his autoimmune disorder.  He is happy with his discharge today and has improved his total active motion 170 degrees versus 93 degrees at the start of care.   Education / Equipment: Pt has all needed materials and education. Pt understands how to continue on with self-management. See tx notes for more details.   Patient agrees to discharge due to max benefits received from outpatient occupational therapy / hand therapy at this time.   Fannie Knee, OTR/L, CHT 02/06/23     END OF SESSION:  OT End of Session - 02/06/23 1015     Visit Number 4    Number of Visits 10    Date for OT Re-Evaluation 02/17/23    Authorization Type [employer]    OT Start Time 1015    OT Stop Time 1042    OT Time Calculation (min) 27 min    Activity Tolerance Patient tolerated treatment well;No increased pain;Patient limited by fatigue    Behavior During Therapy [facility] for tasks assessed/performed                Past Medical History:  Diagnosis Date   Coronary artery disease cardiologist--- dr croitoru   ETT 04-01-2016 in epic, reproduced chest pain but no EKG changes;  cardiac cath 04-06-2016 moderate diffuse nonobstructive disease, aggressive medical management   History of 2019 novel  coronavirus disease (COVID-19) 03/02/2020   per pt tested positive covid, copy of result in epic, stated mild symptoms that resolved   History of kidney stones    Incomplete right bundle branch block    Right ureteral calculus    Wears glasses    Past Surgical History:  Procedure Laterality Date   APPENDECTOMY  teen   BACK SURGERY     two   BIOPSY  08/11/2020   Procedure: BIOPSY;  Surgeon: Lanelle Bal, DO;  [facility];  Service: Endoscopy;;   COLONOSCOPY WITH PROPOFOL N/A 08/11/2020   Procedure: COLONOSCOPY WITH PROPOFOL;  Surgeon: Lanelle Bal, DO;  [facility];  Service: Endoscopy;  Laterality: N/A;  7:30am   CORONARY/GRAFT ACUTE MI REVASCULARIZATION N/A 07/16/2021   Procedure: Coronary/Graft Acute MI Revascularization;  Surgeon: Kathleene Hazel, MD;  [facility];  Service: Cardiovascular;  Laterality: N/A;   CYSTOSCOPY WITH RETROGRADE PYELOGRAM, URETEROSCOPY AND STENT PLACEMENT Right 04/14/2020   Procedure: CYSTOSCOPY WITH RETROGRADE PYELOGRAM, URETEROSCOPY AND STENT PLACEMENT;  Surgeon: Belva Agee, MD;  Location[facility];  Service: Urology;  Laterality: Right;   EXTRACORPOREAL SHOCK WAVE LITHOTRIPSY Right 03/23/2020   Procedure: EXTRACORPOREAL SHOCK WAVE LITHOTRIPSY (ESWL);  Surgeon: Crista Elliot, MD;  Location: [facility];  Service: Urology;  Laterality: Right;   HOLMIUM LASER APPLICATION Right 04/14/2020   Procedure: HOLMIUM LASER APPLICATION;  Surgeon: Belva Agee, MD;  Location: [facility];  Service: Urology;  Laterality: Right;   LAPAROSCOPIC CHOLECYSTECTOMY  2012 approx   LEFT HEART CATH AND CORONARY ANGIOGRAPHY N/A 04/06/2016   Procedure: Left Heart Cath and Coronary Angiography;  Surgeon: Tonny Bollman, MD;  Location: [facility] INVASIVE CV LAB;  Service: Cardiovascular;  Laterality: N/A;   LEFT HEART CATH AND CORONARY ANGIOGRAPHY N/A 07/16/2021   Procedure: LEFT  HEART CATH AND CORONARY ANGIOGRAPHY;  Surgeon: Kathleene Hazel, MD;  [facility];  Service: Cardiovascular;  Laterality: N/A;   ORCHIECTOMY Right 1990s   per pt benign cyst   PARTIAL COLECTOMY Right 09/02/2020   Procedure: HEMICOLECTOMY;  Surgeon: Franky Macho, MD;  [facility]: [facility];  Service: General;  Laterality: Right;   POLYPECTOMY  08/11/2020   Procedure: POLYPECTOMY;  Surgeon: Lanelle Bal, DO;  [facility];  Service: Endoscopy;;   SHOULDER SURGERY Bilateral left 2019;  right 2016   SHOULDER SURGERY Left    SHOULDER SURGERY Right    Patient Active Problem List   Diagnosis Date Noted   Right hand pain 01/27/2023   Family history of prostate cancer in father 11/04/2022   Hematuria, gross 11/01/2022   Arthralgia 10/28/2022   Anxiety 06/10/2022   Generalized muscle ache 06/10/2022   Nephrolithiasis 04/05/2022   Lesion of left ear 11/05/2021   Tinnitus, bilateral 08/04/2021   Acute MI, 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HCC)    S/P partial colectomy 09/02/2020   Dysplastic colon polyp    Cigarette nicotine dependence without complication 09/26/2017   Chronic right-sided low back pain with right-sided sciatica 09/26/2017   Mixed hyperlipidemia 09/26/2017   CAD (coronary artery disease) 05/03/2016   Tobacco abuse    Prediabetes    Gastroesophageal reflux disease    ACS (acute coronary syndrome) (HCC) 03/31/2016   Pelvic floor dysfunction 01/14/2016   Urinary hesitancy 12/21/2015    ONSET DATE: DOS 12/16/22  REFERRING DIAG: Z61.096 (ICD-10-CM) - Trigger index finger of left hand   THERAPY DIAG:  Other lack of coordination  Trigger index finger of left hand  Muscle weakness (generalized)  Pain in left hand  Localized edema  Stiffness of left hand, not elsewhere classified  Rationale for Evaluation and Treatment: Rehabilitation  PERTINENT HISTORY: He states Rt hand RF trigger was the "bad one" about 3 months ago, but a cortisone injection seemed  to relieve it. He states the injection to Lt IF did not help, it hurt, and now he presents ~2 weeks after sx. 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  He is currently out of work until late December.  He does state a history of inflammation in his body with benefits from receiving steroids. PRECAUTIONS: None;  RED FLAGS: None   WEIGHT BEARING RESTRICTIONS: Yes <5# recommended in Lt hand for next month   SUBJECTIVE:   SUBJECTIVE STATEMENT: 6+ weeks s/p left index trigger release. He states that he did test positive for ANA with lab work and is working with a rheumatologist to help problem solve this apparent autoimmune dysfunction.  Otherwise he does feel better and has improved.     PAIN:  Are you having pain? None today  FALLS: Has patient fallen in last 6 months? 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  NEXT MD VISIT: 01/27/23   OBJECTIVE: (All objective assessments below are from initial evaluation  on: 01/02/23 unless otherwise specified.)   HAND DOMINANCE: LEFT  ADLs: Overall ADLs: States decreased ability to grab, hold household objects, pain and difficulty to open containers, perform FMS tasks (manipulate fasteners on clothing)   FUNCTIONAL OUTCOME MEASURES: 02/06/23: Quick DASH: 20% today   Eval: Quick DASH 68% impairment today  (Higher % Score  =  More Impairment)    UPPER EXTREMITY ROM     Shoulder to Wrist AROM Left eval Lt 01/17/23 Lt 02/06/23  Forearm supination 76    Forearm pronation  73    Wrist flexion 49 44 54  Wrist extension 41 37 44  (Blank rows = not tested)   Hand AROM Left eval Lt 01/10/23 Lt 01/17/23 Lt 02/06/2023  Full Fist Ability (or Gap to Distal Palmar Crease) 4cm from tip of MF to [facility] 4.5cm gap 2.8cm gap 2.3cm gap  Thumb Opposition  (Kapandji Scale)  5/10 8/10  9/10  Index MCP (0-90)  0 - 41  0 - 44 0-54 0 - 48  Index PIP (0-100)  (-26) - 54  (-22) - 71 (-26) -61 (-18* ) - 83  Index DIP (0-70)  (-3)- 27  0 - 47 0-47 0 -57  (Blank rows = not tested)   HAND FUNCTION: 02/06/23:  grip: Rt: 30#;  Lt: 55.6#  Eval: Observed weakness in affected Lt hand.  Details will be tested when safe Grip strength Right: 47 lbs, Left: TBD lbs   COORDINATION: 02/06/23:  9HPT Lt:  26 sec    Eval: Observed coordination impairments with affected Lt hand. 9 Hole Peg Test Left: 54 sec (25 sec is WFL)   SENSATION: Eval:  Light touch intact today, though diminished around sx area    EDEMA:    01/17/23: 8.8cm circumferentially around Lt IF P1 (compared to 8.4cm in Rt hand); around MCP Js: 23.3cm   01/10/23: 8.8cm circumferentially around Lt IF P1 (compared to 8.4cm in Rt hand); around MCP Js: 23.2cm   Eval: Severely swollen in Lt hand and palm- all fingers today, 9.5cm circumferentially around Lt P1 (compared to 8.4cm in Rt hand)    TODAY'S TREATMENT:  02/06/2023:  Pt performs AROM, gripping, and strength with left hand and wrist against therapist's resistance for exercise/activities as well as new measures today. OT also discusses home and functional tasks with the pt and reviews goals. Using the complied data, OT also reviews home exercises and provides updated recommendations and upgrades as below (including new isometric grip training as bolded below).  He was recommended to continue on with wrist stretches and finger and hand strengthening for at least 1 to 2 months, but ultimately he needs to work with the rheumatologist to find a medicine to control his swelling and autoimmune symptoms.  Pt states understanding and tolerates upgrades well.     Exercises - Reach arms upward   - 4 x daily - 10 reps - Wrist Flexion Stretch  - 4 x daily - 3-5 reps - 15 sec hold - Seated Wrist Extension Stretch  - 3-6 x daily - 3-5 reps - 15 hold - Finger Spreading  - 4-6 x daily - 10-15 reps - Tendon Glides   - 4-6 x daily - 3-5 reps - 2-3 seconds hold - Thumb Opposition  - 4-6 x daily - 10 reps - BACK KNUCKLE STRETCHES   - 4 x daily - 3-5 reps - 15 sec hold - HOOK Stretch  - 4 x daily - 3-5 reps - 15-20 sec hold -  PUSH KNUCKLES DOWN  - 4 x daily - 3-5 reps - 15 seconds hold - Thumb stretch  - 4 x daily - 3-5 reps - 15-20 sec hold - Towel Roll Grip with Forearm in Neutral  - 3 x daily - 5 reps - 10 sec hold   01/17/23: New swelling measurements today show no significant improvement in swelling despite using therapy techniques and having more time since surgery.  His whole exercise program was reviewed today and he was upgraded to finger stretches as tolerated, though this will not help with swelling and so he was encouraged to continue to do retrograde massages, hold hand above heart and do tendon glides, etc.  OT also trials contrast bath with him today while he is performing tendon glides and active range of motion for exercise.  This did not significantly improve his swelling but it did make him feel less stiff and looser at the end.  He states understanding that he needs to go see his PCP to have autoimmune testing done and that we will withhold therapy until swelling improves  Exercises - Reach arms upward   - 4 x daily - 10 reps - Wrist Flexion Stretch  - 4 x daily - 3-5 reps - 15 sec hold - Seated Wrist Extension Stretch  - 3-6 x daily - 3-5 reps - 15 hold - Finger Spreading  - 4-6 x daily - 10-15 reps - Tendon Glides  - 4-6 x daily - 3-5 reps - 2-3 seconds hold - Thumb Opposition  - 4-6 x daily - 10 reps - BACK KNUCKLE STRETCHES   - 4 x daily - 3-5 reps - 15 sec hold - HOOK Stretch  - 4 x daily - 3-5 reps - 15-20 sec hold - PUSH KNUCKLES DOWN  - 4 x daily - 3-5 reps - 15 seconds hold - Thumb stretch  - 4 x daily - 3-5 reps - 15-20 sec hold   PATIENT EDUCATION: Education details: See tx section above for details  Person educated: Patient Education method: Verbal Instruction, Teach back,  Handouts  Education comprehension: States and demonstrates understanding, Additional Education required    HOME EXERCISE PROGRAM: Access Code: [email or web address] URL: [email or web address] Date: 01/02/2023 Prepared by: Fannie Knee   GOALS: Goals reviewed with patient? Yes   SHORT TERM GOALS: (STG required if POC>30 days) Target Date: 11/20/22  Pt will obtain protective, custom orthotic. Goal status: TBD/PRN  2.  Pt will demo/state understanding of initial HEP to improve pain levels and prerequisite motion. Goal status: 01/12/23: MET   LONG TERM GOALS: Target Date: 02/16/22  Pt will improve functional ability by decreased impairment per Quick DASH assessment from 68% to 18% or better, for better quality of life. Goal status: 02/06/23: Considered MET 20% now   2.  Pt will improve grip strength in Lt hand from unsafe to at least 40lbs for functional use at home and in IADLs. Goal status: 02/06/23: MET  3.  Pt will improve A/ROM in Lt wrist flex/ext from 49/41 to at least 60* each, to have functional motion for tasks like reach and grasp.  Goal status: 02/06/23: NOT MET but he improved and will continue on his own   4. Pt will improve coordination skills in Lt dom hand, as seen by [facility] score on nine-hole peg testing to have increased functional ability to carry out fine motor tasks (fasteners, etc.) and more complex, coordinated IADLs (meal prep, sports, etc.).  Goal status: 02/06/23: MET  6.  Pt  will decrease pain at worst from 5-6/10 to 1/10 or better to have better sleep and occupational participation in daily roles. Goal status: 02/06/23: MET    ASSESSMENT:  CLINICAL IMPRESSION: 02/06/23: Though his wrist is still somewhat tight and he has some swelling lingering, he has met all of his other long-term goals and is doing much better.  Additionally he was positive for ANA testing indicating likely autoimmune disease.  OT believes he has maximized his potential  through conservative treatment and now should focus on getting the right medicines to help treat his autoimmune disorder.  He is happy with his discharge today and has improved his total active motion 170 degrees versus 93 degrees at the start of care.     PLAN:  OT FREQUENCY & OT DURATION: Discharge  PLANNED INTERVENTIONS: 97168 OT Re-evaluation, 97535 self care/ADL training, 52778 therapeutic exercise, 97530 therapeutic activity, 97112 neuromuscular re-education, 97140 manual therapy, 97035 ultrasound, 97039 fluidotherapy, 97010 moist heat, 97010 cryotherapy, 97034 contrast bath, 97760 Orthotics management and training, 24235 Splinting (initial encounter), [id number] Subsequent splinting/medication, scar mobilization, compression bandaging, Dry needling, coping strategies training, and patient/family education  CONSULTED AND AGREED WITH PLAN OF CARE: Patient  PLAN FOR NEXT SESSION:   N/A/discharge   Fannie Knee, OTR/L, CHT 02/06/2023, 12:56 PM

## 2023-01-30 ENCOUNTER — Encounter: Payer: Self-pay | Admitting: *Deleted

## 2023-01-30 ENCOUNTER — Ambulatory Visit: Payer: Managed Care, Other (non HMO) | Admitting: *Deleted

## 2023-01-30 DIAGNOSIS — M542 Cervicalgia: Secondary | ICD-10-CM | POA: Diagnosis not present

## 2023-01-30 DIAGNOSIS — M5459 Other low back pain: Secondary | ICD-10-CM

## 2023-01-30 NOTE — Therapy (Signed)
OUTPATIENT PHYSICAL THERAPY CERVICAL/LUMBAR TREATMENT   Patient Name: Derrick Turner MRN: [id number] DOB:November 24, 1970, 52 y.o., male Today's Date: 01/30/2023  END OF SESSION:  PT End of Session - 01/30/23 0936     Visit Number 3    Number of Visits 4    Date for PT Re-Evaluation 01/06/23    PT Start Time 0934    PT Stop Time 1015    PT Time Calculation (min) 41 min             Past Medical History:  Diagnosis Date   Coronary artery disease cardiologist--- dr croitoru   ETT 04-01-2016 in epic, reproduced chest pain but no EKG changes;  cardiac cath 04-06-2016 moderate diffuse nonobstructive disease, aggressive medical management   History of 2019 novel coronavirus disease (COVID-19) 03/02/2020   per pt tested positive covid, copy of result in epic, stated mild symptoms that resolved   History of kidney stones    Incomplete right bundle branch block    Right ureteral calculus    Wears glasses    Past Surgical History:  Procedure Laterality Date   APPENDECTOMY  teen   BACK SURGERY     two   BIOPSY  08/11/2020   Procedure: BIOPSY;  Surgeon: Lanelle Bal, DO;  [facility];  Service: Endoscopy;;   COLONOSCOPY WITH PROPOFOL N/A 08/11/2020   Procedure: COLONOSCOPY WITH PROPOFOL;  Surgeon: Lanelle Bal, DO;  [facility];  Service: Endoscopy;  Laterality: N/A;  7:30am   CORONARY/GRAFT ACUTE MI REVASCULARIZATION N/A 07/16/2021   Procedure: Coronary/Graft Acute MI Revascularization;  Surgeon: Kathleene Hazel, MD;  [facility];  Service: Cardiovascular;  Laterality: N/A;   CYSTOSCOPY WITH RETROGRADE PYELOGRAM, URETEROSCOPY AND STENT PLACEMENT Right 04/14/2020   Procedure: CYSTOSCOPY WITH RETROGRADE PYELOGRAM, URETEROSCOPY AND STENT PLACEMENT;  Surgeon: Belva Agee, MD;  Location: [facility];  Service: Urology;  Laterality: Right;   EXTRACORPOREAL SHOCK WAVE LITHOTRIPSY Right 03/23/2020   Procedure:  EXTRACORPOREAL SHOCK WAVE LITHOTRIPSY (ESWL);  Surgeon: Crista Elliot, MD;  Location: [facility];  Service: Urology;  Laterality: Right;   HOLMIUM LASER APPLICATION Right 04/14/2020   Procedure: HOLMIUM LASER APPLICATION;  Surgeon: Belva Agee, MD;  Location: [facility];  Service: Urology;  Laterality: Right;   LAPAROSCOPIC CHOLECYSTECTOMY  2012 approx   LEFT HEART CATH AND CORONARY ANGIOGRAPHY N/A 04/06/2016   Procedure: Left Heart Cath and Coronary Angiography;  Surgeon: Tonny Bollman, MD;  Location: [facility]VASIVE CV LAB;  Service: Cardiovascular;  Laterality: N/A;   LEFT HEART CATH AND CORONARY ANGIOGRAPHY N/A 07/16/2021   Procedure: LEFT HEART CATH AND CORONARY ANGIOGRAPHY;  Surgeon: Kathleene Hazel, MD;  [facility];  Service: Cardiovascular;  Laterality: N/A;   ORCHIECTOMY Right 1990s   per pt benign cyst   PARTIAL COLECTOMY Right 09/02/2020   Procedure: HEMICOLECTOMY;  Surgeon: Franky Macho, MD;  Location: [facility];  Service: General;  Laterality: Right;   POLYPECTOMY  08/11/2020   Procedure: POLYPECTOMY;  Surgeon: Lanelle Bal, DO;  [facility];  Service: Endoscopy;;   SHOULDER SURGERY Bilateral left 2019;  right 2016   SHOULDER SURGERY Left    SHOULDER SURGERY Right    Patient Active Problem List   Diagnosis Date Noted   Right hand pain 01/27/2023   Family history of prostate cancer in father 11/04/2022   Hematuria, gross 11/01/2022   Arthralgia 10/28/2022   Anxiety  06/10/2022   Generalized muscle ache 06/10/2022   Nephrolithiasis 04/05/2022   Lesion of left ear 11/05/2021   Tinnitus, bilateral 08/04/2021   Acute MI, 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HCC)    S/P partial colectomy 09/02/2020   Dysplastic colon polyp    Cigarette nicotine dependence without complication 09/26/2017   Chronic right-sided low back pain with right-sided sciatica 09/26/2017   Mixed hyperlipidemia 09/26/2017   CAD (coronary artery disease)  05/03/2016   Tobacco abuse    Prediabetes    Gastroesophageal reflux disease    ACS (acute coronary syndrome) (HCC) 03/31/2016   Pelvic floor dysfunction 01/14/2016   Urinary hesitancy 12/21/2015    REFERRING PROVIDER: Gershon Mussel MD  REFERRING DIAG: Neck and chronic bilateral low back pain without sciatica.  Rationale for Evaluation and Treatment: Rehabilitation  THERAPY DIAG:  Other low back pain  Cervicalgia  ONSET DATE: Ongoing.  SUBJECTIVE:                                                                                                                                                                                           SUBJECTIVE STATEMENT: The patient presents today reporting needing therex for total body due to systemic joint tightness swelling/stiffness  Bilateral shoulder surgeries and two lumbar surgeries.  PAIN:  Are you having pain? Yes: NPRS scale: 2(neck)..4/10 Pain location: Neck and low back. Pain description: Ache and sharp. Aggravating factors: As above. Relieving factors: As above.  PRECAUTIONS: None  RED FLAGS: None   WEIGHT BEARING RESTRICTIONS: No  FALLS:  Has patient fallen in last 6 months? No  LIVING ENVIRONMENT: Lives with: lives with their spouse Lives in: House/apartment Has following equipment at home: None PLOF: Independent  PATIENT GOALS: Decrease pain and move better with less pain.   OBJECTIVE:  Note: Objective measures were completed at Evaluation unless otherwise noted.  Date   01-30-2023                                    EXERCISE LOG   LB        Exercise Repetitions and Resistance Comments  Nustep X13 mins  L3   Bridging X 10 hold 5 secs   AB  bracing X 5 hold 5 secs   Bent knee raise  X 6 hold 5 secs   Seated knee raise X 6 hold 5 secs   SOC Pelvic tilts x 10   mobility   LAQs X20 Bil LEs    Blank cell = exercise not performed today   Discussed local gym membership for light total  body  therex  DIAGNOSTIC FINDINGS:  11/23/22:  X-rays of the lumbar spine show mild degenerative changes of the facet  joints and mild narrowing at the L5-S1 disc space.   11/23/22:  X-rays of the cervical spine show no acute or structural abnormalities   POSTURE: rounded shoulders and forward head  PALPATION: C/o pain in region of C7-T1 and right SIJ region.  LUMBAR/CERVICAL ROM:  Active bilateral cervical rotation is 70 degrees and active lumbar flexion and extension is WNL.  LOWER EXTREMITY MMT:    Normal bilateral elbow strength and normal bilateral LE strength.  LUMBAR SPECIAL TESTS:  Normal U/LE DTR's.  Right LE slightly longer than left but was equalized with a right SKTC stretch.  GAIT: WNL.   PATIENT EDUCATION:  Education details: See below.  Discussed correct posture and sleeping positions and use of pillows for comfort. Person educated: Patient Education method: Explanation, Demonstration, Tactile cues, and Handouts Education comprehension: verbalized understanding and returned demonstration  HOME EXERCISE PROGRAM: HOME EXERCISE PROGRAM Created by [place] Applegate Nov 1st, 2024 View at [email or web address] using code: [email or web address]  Page 1 of 2 5 Exercises RETRACTION / CHIN TUCK Slowly draw your head back so that your ears line up with your shoulders. Repeat 10 Times Hold 5 Seconds Complete 1 Set Perform 6 Times a Day CERVICAL EXTENSION WITH TOWEL - CURVE OF NECK Start with a small hand towel wrapped around the curve of your neck and holding the ends of the towel forward as shown. Next, extend your neck back over the towel as to look up at the ceiling. Then, return to starting position.  Your hands should remain still and holding the ends of the towel the entire time. Repeat 5 Times Hold 2 Seconds Complete 1 Set Perform 3 Times a Day SINGLE KNEE TO CHEST STRETCH - SKTC While Lying on your back, hold your knee and gently pull it up towards your chest. Repeat 3  Times Hold 1 Minute Complete 1 Set Perform 3 Times a Day Double Knee to chest stretch Pull your knees up to your chest hold for 30 seconds then relax. Repeat Continue to breathe throughout stretch. Repeat 2 Times Hold 30 Seconds Complete 3 Sets Perform 2 Times a Day Bridges While laying on your back, contract the low abdominals and lift from the hips. Repeat 15 Times Hold 2 Seconds Complete 2 Sets Perform 2 Times a Day Created by [place] Applegate Page 2   ASSESSMENT:  CLINICAL IMPRESSION: The patient reports doing better overall due to meds, but has stiffness all over. Rx focused on ab bracing and core strengthening as well as LE,s and did well. Discussed Local gym for total body therex.    OBJECTIVE IMPAIRMENTS: decreased activity tolerance, increased muscle spasms, postural dysfunction, and pain.   ACTIVITY LIMITATIONS: bending and sitting  PARTICIPATION LIMITATIONS: occupation  PERSONAL FACTORS: Time since onset of injury/illness/exacerbation and 1 comorbidity: 2 prior lumbar surgeries.  are also affecting patient's functional outcome.   REHAB POTENTIAL: Good  CLINICAL DECISION MAKING: Evolving/moderate complexity  EVALUATION COMPLEXITY: Low   GOALS:   SHORT TERM GOALS: Target date: 01/06/23  Ind with a HEP. Goal status: INITIAL  2.  Sit 30 minutes with pain not > 4/10.  Goal status: INITIAL  3.  Perform ADL's with pain not > 4/10.  Goal status: INITIAL  PLAN:  PT FREQUENCY:  4 visits.  PT DURATION: 4 weeks  PLANNED INTERVENTIONS: 97110-Therapeutic exercises, 97530- Therapeutic activity, [id number]- Neuromuscular re-education, 97535- Self Care, 16109- Manual  therapy, 97014- Electrical stimulation (unattended), [id number]- Ultrasound, Patient/Family education, Cryotherapy, and Moist heat.  PLAN FOR NEXT SESSION: Please HEP, adductor squeezes, draw-in education, core exercise progression, spinal protection techniques and body mechanics training.   Richar Dunklee,CHRIS,  PTA 01/30/2023, 1:12 PM

## 2023-01-31 NOTE — Addendum Note (Signed)
Addended by: Albert Devaul, [place] W on: 01/31/2023 12:42 PM   Modules accepted: Orders

## 2023-02-06 ENCOUNTER — Encounter: Payer: Self-pay | Admitting: Rehabilitative and Restorative Service Providers"

## 2023-02-06 ENCOUNTER — Ambulatory Visit (INDEPENDENT_AMBULATORY_CARE_PROVIDER_SITE_OTHER): Payer: Managed Care, Other (non HMO) | Admitting: Rehabilitative and Restorative Service Providers"

## 2023-02-06 DIAGNOSIS — M25642 Stiffness of left hand, not elsewhere classified: Secondary | ICD-10-CM

## 2023-02-06 DIAGNOSIS — M6281 Muscle weakness (generalized): Secondary | ICD-10-CM | POA: Diagnosis not present

## 2023-02-06 DIAGNOSIS — R600 Localized edema: Secondary | ICD-10-CM

## 2023-02-06 DIAGNOSIS — M79642 Pain in left hand: Secondary | ICD-10-CM

## 2023-02-06 DIAGNOSIS — M65322 Trigger finger, left index finger: Secondary | ICD-10-CM | POA: Diagnosis not present

## 2023-02-06 DIAGNOSIS — R278 Other lack of coordination: Secondary | ICD-10-CM | POA: Diagnosis not present

## 2023-02-09 ENCOUNTER — Other Ambulatory Visit: Payer: Self-pay | Admitting: Family Medicine

## 2023-02-09 DIAGNOSIS — R768 Other specified abnormal immunological findings in serum: Secondary | ICD-10-CM

## 2023-02-27 ENCOUNTER — Ambulatory Visit (INDEPENDENT_AMBULATORY_CARE_PROVIDER_SITE_OTHER): Payer: Managed Care, Other (non HMO) | Admitting: Orthopedic Surgery

## 2023-02-27 DIAGNOSIS — M65341 Trigger finger, right ring finger: Secondary | ICD-10-CM

## 2023-02-27 DIAGNOSIS — M65322 Trigger finger, left index finger: Secondary | ICD-10-CM | POA: Diagnosis not present

## 2023-02-27 MED ORDER — LIDOCAINE HCL 1 % IJ SOLN
1.0000 mL | INTRAMUSCULAR | Status: AC | PRN
Start: 2023-02-27 — End: 2023-02-27
  Administered 2023-02-27: 1 mL

## 2023-02-27 MED ORDER — BETAMETHASONE SOD PHOS & ACET 6 (3-3) MG/ML IJ SUSP
6.0000 mg | INTRAMUSCULAR | Status: AC | PRN
Start: 2023-02-27 — End: 2023-02-27
  Administered 2023-02-27: 6 mg via INTRA_ARTICULAR

## 2023-02-27 NOTE — Progress Notes (Signed)
   Derrick Turner - 53 y.o. male MRN [id number]  Date of birth: Dec 11, 1970  Office Visit Note: Visit Date: 02/27/2023 PCP: Gilmore Laroche, FNP Referred by: Gilmore Laroche, FNP  Subjective:  HPI: Derrick Turner is a 53 y.o. male who presents today for follow up 10 weeks status post left index finger trigger digit release.  He is doing well overall from the left hand standpoint, swelling and inflammation have improved.  He has an upcoming appointment with rheumatology in May of this year for potential ongoing inflammatory component.  He states that he is having recurrent triggering of the right ring finger.  He is undergone previous injection last year for this with temporary relief.  Pertinent ROS were reviewed with the patient and found to be negative unless otherwise specified above in HPI.   Assessment & Plan: Visit Diagnoses: No diagnosis found.  Plan: I am pleased to see that he is improving nicely from the left index finger trigger digit release.  As for the right ring finger, given that he has symptoms that have recurred in this region, we discussed the possibility for repeat cortisone injection to the right ring finger A1 pulley for symptom relief.  This should hopefully bridge him until his upcoming appointment with rheumatology in the near future.  At that juncture, if he is having ongoing problems, we could discuss the possibility for right ring finger trigger digit release.    Follow-up: No follow-ups on file.   Meds & Orders: No orders of the defined types were placed in this encounter.  No orders of the defined types were placed in this encounter.    Procedures: Hand/UE Inj: R ring A1 for trigger finger on 02/27/2023 2:38 PM Indications: pain Details: 25 G needle, volar approach Medications: 1 mL lidocaine 1 %; 6 mg betamethasone acetate-betamethasone sodium phosphate 6 (3-3) MG/ML Outcome: tolerated well, no immediate complications Consent was given by the  patient. Patient was prepped and draped in the usual sterile fashion.           Objective:   Vital Signs: There were no vitals taken for this visit.  Ortho Exam Left hand: - Well-healed incision at the base of the index finger - Able to perform full digital range of motion without residual clicking or locking - Sensation intact distally, hand is warm well-perfused  Right hand: - Palpable nodule ring finger A1 pulley with associated tenderness - Notable clicking with deep flexion of the ring finger   Imaging: No results found.   Taylin Mans Trevor Mace, M.D. [facility] 2:01 PM

## 2023-02-28 ENCOUNTER — Ambulatory Visit: Payer: Managed Care, Other (non HMO) | Admitting: Family Medicine

## 2023-02-28 ENCOUNTER — Telehealth: Payer: Self-pay | Admitting: Orthopedic Surgery

## 2023-02-28 ENCOUNTER — Encounter: Payer: Self-pay | Admitting: Family Medicine

## 2023-02-28 VITALS — BP 150/94 | HR 70 | Ht 70.0 in | Wt 228.0 lb

## 2023-02-28 DIAGNOSIS — R7303 Prediabetes: Secondary | ICD-10-CM

## 2023-02-28 DIAGNOSIS — E559 Vitamin D deficiency, unspecified: Secondary | ICD-10-CM

## 2023-02-28 DIAGNOSIS — M2550 Pain in unspecified joint: Secondary | ICD-10-CM | POA: Diagnosis not present

## 2023-02-28 DIAGNOSIS — E782 Mixed hyperlipidemia: Secondary | ICD-10-CM | POA: Diagnosis not present

## 2023-02-28 DIAGNOSIS — E038 Other specified hypothyroidism: Secondary | ICD-10-CM

## 2023-02-28 DIAGNOSIS — R7301 Impaired fasting glucose: Secondary | ICD-10-CM

## 2023-02-28 DIAGNOSIS — R030 Elevated blood-pressure reading, without diagnosis of hypertension: Secondary | ICD-10-CM | POA: Diagnosis not present

## 2023-02-28 MED ORDER — MELOXICAM 7.5 MG PO TABS: 7.5000 mg | ORAL_TABLET | Freq: Every day | ORAL | 1 refills | Status: DC

## 2023-02-28 NOTE — Assessment & Plan Note (Addendum)
Taking atorvastatin 80 mg daily The patient was encouraged to make lifestyle changes, including avoiding simple carbohydrates such as cakes, sweet desserts, ice cream, soda (diet or regular), sweet tea, candies, chips, cookies, store-bought juices, excessive alcohol (more than 1-2 drinks per day), lemonade, artificial sweeteners, donuts, coffee creamers, and sugar-free products. Additionally, reducing the consumption of greasy, fatty foods and increasing physical activity were recommended. The patient verbalized understanding and is aware of the plan of care.

## 2023-02-28 NOTE — Progress Notes (Signed)
Established Patient Office Visit  Subjective:  Patient ID: Derrick Turner, male    DOB: [birth year]  Age: 53 y.o. MRN: [id number]  CC:  Chief Complaint  Patient presents with   Medical Management of Chronic Issues    4 mo chronic follow up.  Request to review labs and needs advice on what to do until he can see rheumatology     HPI Derrick Turner is a 53 y.o. male with past medical history of hyperlipidemia, osteoarthritis of the hands presents for f/u of  chronic medical conditions. For the details of today's visit, please refer to the assessment and plan.  Past Medical History:  Diagnosis Date   Coronary artery disease cardiologist--- dr croitoru   ETT 04-01-2016 in epic, reproduced chest pain but no EKG changes;  cardiac cath 04-06-2016 moderate diffuse nonobstructive disease, aggressive medical management   History of 2019 novel coronavirus disease (COVID-19) 03/02/2020   per pt tested positive covid, copy of result in epic, stated mild symptoms that resolved   History of kidney stones    Incomplete right bundle branch block    Right ureteral calculus    Wears glasses     Past Surgical History:  Procedure Laterality Date   APPENDECTOMY  teen   BACK SURGERY     two   BIOPSY  08/11/2020   Procedure: BIOPSY;  Surgeon: Lanelle Bal, DO;  Location: [facility];  Service: Endoscopy;;   COLONOSCOPY WITH PROPOFOL N/A 08/11/2020   Procedure: COLONOSCOPY WITH PROPOFOL;  Surgeon: Lanelle Bal, DO;  Location: [facility];  Service: Endoscopy;  Laterality: N/A;  7:30am   CORONARY/GRAFT ACUTE MI REVASCULARIZATION N/A 07/16/2021   Procedure: Coronary/Graft Acute MI Revascularization;  Surgeon: Kathleene Hazel, MD;  Location: [facility];  Service: Cardiovascular;  Laterality: N/A;   CYSTOSCOPY WITH RETROGRADE PYELOGRAM, URETEROSCOPY AND STENT PLACEMENT Right 04/14/2020   Procedure: CYSTOSCOPY WITH RETROGRADE PYELOGRAM, URETEROSCOPY AND STENT PLACEMENT;   Surgeon: Belva Agee, MD;  Location: [facility];  Service: Urology;  Laterality: Right;   EXTRACORPOREAL SHOCK WAVE LITHOTRIPSY Right 03/23/2020   Procedure: EXTRACORPOREAL SHOCK WAVE LITHOTRIPSY (ESWL);  Surgeon: Crista Elliot, MD;  Location: [facility];  Service: Urology;  Laterality: Right;   HOLMIUM LASER APPLICATION Right 04/14/2020   Procedure: HOLMIUM LASER APPLICATION;  Surgeon: Belva Agee, MD;  Location: [facility];  Service: Urology;  Laterality: Right;   LAPAROSCOPIC CHOLECYSTECTOMY  2012 approx   LEFT HEART CATH AND CORONARY ANGIOGRAPHY N/A 04/06/2016   Procedure: Left Heart Cath and Coronary Angiography;  Surgeon: Tonny Bollman, MD;  Location: [facility];  Service: Cardiovascular;  Laterality: N/A;   LEFT HEART CATH AND CORONARY ANGIOGRAPHY N/A 07/16/2021   Procedure: LEFT HEART CATH AND CORONARY ANGIOGRAPHY;  Surgeon: Kathleene Hazel, MD;  Location: [facility];  Service: Cardiovascular;  Laterality: N/A;   ORCHIECTOMY Right 1990s   per pt benign cyst   PARTIAL COLECTOMY Right 09/02/2020   Procedure: HEMICOLECTOMY;  Surgeon: Franky Macho, MD;  Location: [facility];  Service: General;  Laterality: Right;   POLYPECTOMY  08/11/2020   Procedure: POLYPECTOMY;  Surgeon: Lanelle Bal, DO;  Location: [facility];  Service: Endoscopy;;   SHOULDER SURGERY Bilateral left 2019;  right 2016   SHOULDER SURGERY Left    SHOULDER SURGERY Right     Family History  Problem Relation Age of Onset   Heart attack Father  Heart attack Maternal Grandmother    Heart attack Maternal Grandfather    Colon cancer Neg Hx    Colon polyps Neg Hx     Social History   Socioeconomic History   Marital status: Married    Spouse name: Not on file   Number of children: Not on file   Years of education: Not on file   Highest education level: 12th grade  Occupational History   Not on file  Tobacco Use   Smoking  status: Every Day    Current packs/day: 1.50    Average packs/day: 1.5 packs/day for 35.0 years (52.5 ttl pk-yrs)    Types: Cigarettes   Smokeless tobacco: Never   Tobacco comments:    11/08/2022 Patient smokes 1/2 pack daily  Vaping Use   Vaping status: Never Used  Substance and Sexual Activity   Alcohol use: Yes    Comment: occasional   Drug use: Never   Sexual activity: Yes  Other Topics Concern   Not on file  Social History Narrative   Not on file   Social Drivers of Health   Financial Resource Strain: Low Risk  (01/26/2023)   Overall Financial Resource Strain (CARDIA)    Difficulty of Paying Living Expenses: Not very hard  Food Insecurity: No Food Insecurity (01/26/2023)   Hunger Vital Sign    Worried About Running Out of Food in the Last Year: Never true    Ran Out of Food in the Last Year: Never true  Transportation Needs: No Transportation Needs (01/26/2023)   PRAPARE - Administrator, Civil Service (Medical): No    Lack of Transportation (Non-Medical): No  Physical Activity: Insufficiently Active (01/26/2023)   Exercise Vital Sign    Days of Exercise per Week: 6 days    Minutes of Exercise per Session: 20 min  Stress: Stress Concern Present (01/26/2023)   [employer] of Occupational Health - Occupational Stress Questionnaire    Feeling of Stress : To some extent  Social Connections: Moderately Integrated (01/26/2023)   Social Connection and Isolation Panel [NHANES]    Frequency of Communication with Friends and Family: More than three times a week    Frequency of Social Gatherings with Friends and Family: Once a week    Attends Religious Services: More than 4 times per year    Active Member of [employer] or Organizations: No    Attends Engineer, structural: Not on file    Marital Status: Married  Intimate Partner Violence: Unknown (06/07/2022)   Received from [employer], [facility]   HITS    Physically Hurt: Not on file    Insult  or Talk Down To: Not on file    Threaten Physical Harm: Not on file    Scream or Curse: Not on file    Outpatient Medications Prior to Visit  Medication Sig Dispense Refill   atorvastatin (LIPITOR) 80 MG tablet Take 1 tablet (80 mg total) by mouth daily. 90 tablet 3   azelastine (ASTELIN) 0.1 % nasal spray Place 2 sprays into both nostrils 2 (two) times daily. 1 mL 2   cetirizine (ZYRTEC) 10 MG tablet Take 10 mg by mouth daily.     citalopram (CELEXA) 20 MG tablet TAKE 1 TABLET BY MOUTH EVERY DAY 90 tablet 2   cyclobenzaprine (FLEXERIL) 5 MG tablet Take 1 tablet (5 mg total) by mouth 3 (three) times daily as needed for muscle spasms. 30 tablet 1   docusate sodium (COLACE) 100 MG capsule  Take 100 mg by mouth at bedtime.     hydrOXYzine (VISTARIL) 25 MG capsule TAKE 1 CAPSULE (25 MG TOTAL) BY MOUTH EVERY 8 (EIGHT) HOURS AS NEEDED. 270 capsule 1   predniSONE (STERAPRED UNI-PAK 21 TAB) 10 MG (21) TBPK tablet Take as directed 21 tablet 0   Probiotic Product (PROBIOTIC PO) Take 1 capsule by mouth daily.     triamcinolone cream (KENALOG) 0.1 % Apply 1 Application topically 2 (two) times daily. 80 g 0   No facility-administered medications prior to visit.    No Known Allergies  ROS Review of Systems  Constitutional:  Negative for fatigue and fever.  Eyes:  Negative for visual disturbance.  Respiratory:  Negative for chest tightness and shortness of breath.   Cardiovascular:  Negative for chest pain and palpitations.  Musculoskeletal:  Positive for arthralgias.  Neurological:  Negative for dizziness and headaches.      Objective:    Physical Exam HENT:     Head: Normocephalic.     Right Ear: External ear normal.     Left Ear: External ear normal.     Nose: No congestion or rhinorrhea.     Mouth/Throat:     Mouth: Mucous membranes are moist.  Cardiovascular:     Rate and Rhythm: Regular rhythm.     Heart sounds: No murmur heard. Pulmonary:     Effort: No respiratory distress.      Breath sounds: Normal breath sounds.  Neurological:     Mental Status: He is alert.     BP (!) 150/94   Pulse 70   Ht 5\' 10"  (1.778 m)   Wt 228 lb (103.4 kg)   SpO2 (!) 62%   BMI 32.71 kg/m  Wt Readings from Last 3 Encounters:  02/28/23 228 lb (103.4 kg)  01/27/23 227 lb 1.9 oz (103 kg)  11/18/22 223 lb (101.2 kg)    Lab Results  Component Value Date   TSH 1.220 03/11/2022   Lab Results  Component Value Date   WBC 15.6 (H) 11/01/2022   HGB 16.1 11/01/2022   HCT 47.3 11/01/2022   MCV 89 11/01/2022   PLT 249 11/01/2022   Lab Results  Component Value Date   NA 137 11/01/2022   K 4.8 11/01/2022   CO2 27 11/01/2022   GLUCOSE 104 (H) 11/01/2022   BUN 17 11/01/2022   CREATININE 0.80 11/01/2022   BILITOT 0.5 03/11/2022   ALKPHOS 89 03/11/2022   AST 16 03/11/2022   ALT 16 03/11/2022   PROT 6.8 03/11/2022   ALBUMIN 4.6 03/11/2022   CALCIUM 9.1 11/01/2022   ANIONGAP 14 07/27/2021   EGFR 105 11/01/2022   Lab Results  Component Value Date   CHOL 82 (L) 03/11/2022   Lab Results  Component Value Date   HDL 42 03/11/2022   Lab Results  Component Value Date   LDLCALC 27 03/11/2022   Lab Results  Component Value Date   TRIG 50 03/11/2022   Lab Results  Component Value Date   CHOLHDL 2.0 03/11/2022   Lab Results  Component Value Date   HGBA1C 6.1 (H) 03/11/2022      Assessment & Plan:  Arthralgia, unspecified joint Assessment & Plan: The patient reports a history of osteoarthritis in both the right and left hands and states that they received a steroid injection in the right hand a few days ago from orthopedic surgery. He complains of increased joint stiffness and pain in his knees, shoulders, and hips, accompanied by morning  stiffness and decreased range of motion. No swelling is noted today, and the pain during the clinic visit is rated as 4/10. He reports symptom flare-ups when the weather changes, particularly during colder weather and when it rains.  No recent injury, trauma, or systemic symptoms are reported.  The patient states that the pain worsens with prolonged inactivity but improves with steroid treatment. However, after completing three doses of a steroid taper, he wishes to avoid starting another steroid taper at this time.  Therapy with meloxicam 7.5 mg will be initiated to relieve osteoarthritis pain. The patient was encouraged to engage in low-impact physical activities such as swimming, cycling, and yoga. The application of heat packs was recommended to relax muscles and improve blood flow to stiff joints.  Additionally, I advised increasing his intake of anti-inflammatory foods, such as omega-3 fatty acids found in fatty fish and walnuts, and fruits and vegetables rich in antioxidants, such as berries and spinach. Spices like turmeric and ginger were also recommended. The patient was advised to avoid pro-inflammatory foods by limiting sugar, refined carbohydrates, saturated fats, and processed foods.  Orders: -     Meloxicam; Take 1 tablet (7.5 mg total) by mouth daily.  Dispense: 60 tablet; Refill: 1  Mixed hyperlipidemia Assessment & Plan: Taking atorvastatin 80 mg daily The patient was encouraged to make lifestyle changes, including avoiding simple carbohydrates such as cakes, sweet desserts, ice cream, soda (diet or regular), sweet tea, candies, chips, cookies, store-bought juices, excessive alcohol (more than 1-2 drinks per day), lemonade, artificial sweeteners, donuts, coffee creamers, and sugar-free products. Additionally, reducing the consumption of greasy, fatty foods and increasing physical activity were recommended. The patient verbalized understanding and is aware of the plan of care.   Orders: -     Lipid panel -     CMP14+EGFR -     CBC with Differential/Platelet  Prediabetes Assessment & Plan: For managing prediabetes, I recommend the following lifestyle changes:  Reduce Intake of High-Sugar Foods and  Beverages: Limit foods and drinks high in sugar to help regulate blood sugar levels. Increase Consumption of Nutrient-Rich Foods: Focus on incorporating more fruits, vegetables, and whole grains into your diet. Choose Lean Proteins: Opt for lean proteins such as chicken, fish, beans, and legumes. Select Low-Fat Dairy Products: Choose low-fat or non-fat dairy options. Minimize Saturated Fats, Trans Fats, and Cholesterol: Reduce intake of foods high in saturated fats, trans fatty acids, and cholesterol. Engage in Regular Physical Activity: Aim for at least 30 minutes of brisk walking or other moderate activity at least 5 days a week.  Lifestyle modifications for prediabetes were discussed, including adopting a heart-healthy diet and increasing physical activity. The patient was encouraged to decrease her intake of high-sugar foods and beverages. She verbalized understanding and is aware of the plan of care.    Elevated BP without diagnosis of hypertension Assessment & Plan: The patient's blood pressure is elevated in the clinic, despite no prior diagnosis of hypertension. He is currently asymptomatic. A follow-up appointment is scheduled in one week for nurse visits to monitor his blood pressure. If his blood pressure remains elevated, we will initiate therapy with an antihypertensive medication.  A low-sodium diet of less than 2,300 mg daily is recommended, along with increased physical activity of moderate intensity, aiming for 150 minutes per week. The patient is encouraged to continue these lifestyle modifications to help manage his blood pressure effectively.  BP Readings from Last 3 Encounters:  02/28/23 (!) 150/94  01/27/23 118/74  11/18/22  137/89         IFG (impaired fasting glucose) -     Hemoglobin A1c  Vitamin D deficiency -     VITAMIN D 25 Hydroxy (Vit-D Deficiency, Fractures)  TSH (thyroid-stimulating hormone deficiency) -     TSH + free T4  Note: This chart has been  completed using Engineer, civil (consulting) software, and while attempts have been made to ensure accuracy, certain words and phrases may not be transcribed as intended.    Follow-up: No follow-ups on file.   Gilmore Laroche, FNP

## 2023-02-28 NOTE — Patient Instructions (Addendum)
I appreciate the opportunity to provide care to you today!    Follow up:  1 week nurse visit for BP and 4 months f/u  Labs: please stop by the lab today to get your blood drawn (CBC, CMP, TSH, Lipid profile, HgA1c, Vit D)  Arthritis  Start taking meloxicam 7.5 mg daily. Please monitor for signs of bleeding while taking meloxicam and Celexa. Signs of bleeding to watch for include:  Black, tarry stools (melena). Bright red blood in stools or rectal bleeding. Vomiting blood or material that looks like coffee grounds. Persistent abdominal pain or discomfort, especially in the upper stomach area. Bruising and Skin Changes: Easy or unexplained bruising. Frequent or severe nosebleeds. Bleeding gums when brushing teeth. Appearance of small red or purple spots under the skin (petechiae).   Nonpharmacological Interventions for Arthritis Nonpharmacological approaches to managing arthritis focus on reducing pain, improving joint function, and enhancing overall quality of life. These strategies complement medical treatments and can be highly effective in managing symptoms.  1. Physical Activity Low-Impact Exercises: Regular movement helps maintain joint flexibility and reduces stiffness. Examples: Walking, swimming, cycling, or yoga. Strengthening Exercises: Build muscle around the joints to provide better support. Focus on the core and the muscles around affected joints. Stretching and Flexibility Exercises: Improve range of motion and reduce stiffness. Incorporate gentle stretches or practices like tai chi. 2. Weight Management Healthy Weight: Reducing excess weight decreases stress on weight-bearing joints, such as knees and hips, alleviating pain and slowing joint damage. Dietary Adjustments: Focus on a balanced diet rich in anti-inflammatory foods (see below). 3. Heat and Cold Therapy Heat Therapy: Use warm compresses, heating pads, or warm baths to relax muscles and improve blood flow  to stiff joints. Cold Therapy: Apply ice packs or cool compresses to reduce swelling and numb acute pain. 4. Anti-Inflammatory Foods: Omega-3 fatty acids: Found in fatty fish (e.g., salmon, mackerel), flaxseeds, and walnuts. Fruits and vegetables: Rich in antioxidants (e.g., berries, spinach, kale). Whole grains: Quinoa, brown rice, and oats. Spices: Turmeric and ginger have anti-inflammatory properties. Avoid Pro-Inflammatory Foods: Limit sugar, refined carbs, saturated fats, and processed foods. 5. Adequate Rest Balance Activity and Rest: Avoid overexertion, which can worsen pain or stiffness. Ensure proper sleep to promote healing and reduce fatigue.   Attached with your AVS, you will find valuable resources for self-education. I highly recommend dedicating some time to thoroughly examine them.   Please continue to a heart-healthy diet and increase your physical activities. Try to exercise for at least five days a week.    It was a pleasure to see you and I look forward to continuing to work together on your health and well-being. Please do not hesitate to call the office if you need care or have questions about your care.  In case of emergency, please visit the Emergency Department for urgent care, or contact our clinic at [phone] to schedule an appointment. We're here to help you!   Have a wonderful day and week. With Gratitude, Gilmore Laroche MSN, FNP-BC

## 2023-02-28 NOTE — Assessment & Plan Note (Addendum)
The patient reports a history of osteoarthritis in both the right and left hands and states that they received a steroid injection in the right hand a few days ago from orthopedic surgery. He complains of increased joint stiffness and pain in his knees, shoulders, and hips, accompanied by morning stiffness and decreased range of motion. No swelling is noted today, and the pain during the clinic visit is rated as 4/10. He reports symptom flare-ups when the weather changes, particularly during colder weather and when it rains. No recent injury, trauma, or systemic symptoms are reported.  The patient states that the pain worsens with prolonged inactivity but improves with steroid treatment. However, after completing three doses of a steroid taper, he wishes to avoid starting another steroid taper at this time.  Therapy with meloxicam 7.5 mg will be initiated to relieve osteoarthritis pain. The patient was encouraged to engage in low-impact physical activities such as swimming, cycling, and yoga. The application of heat packs was recommended to relax muscles and improve blood flow to stiff joints.  Additionally, I advised increasing his intake of anti-inflammatory foods, such as omega-3 fatty acids found in fatty fish and walnuts, and fruits and vegetables rich in antioxidants, such as berries and spinach. Spices like turmeric and ginger were also recommended. The patient was advised to avoid pro-inflammatory foods by limiting sugar, refined carbohydrates, saturated fats, and processed foods.

## 2023-02-28 NOTE — Assessment & Plan Note (Signed)
For managing prediabetes, I recommend the following lifestyle changes:  Reduce Intake of High-Sugar Foods and Beverages: Limit foods and drinks high in sugar to help regulate blood sugar levels. Increase Consumption of Nutrient-Rich Foods: Focus on incorporating more fruits, vegetables, and whole grains into your diet. Choose Lean Proteins: Opt for lean proteins such as chicken, fish, beans, and legumes. Select Low-Fat Dairy Products: Choose low-fat or non-fat dairy options. Minimize Saturated Fats, Trans Fats, and Cholesterol: Reduce intake of foods high in saturated fats, trans fatty acids, and cholesterol. Engage in Regular Physical Activity: Aim for at least 30 minutes of brisk walking or other moderate activity at least 5 days a week.  Lifestyle modifications for prediabetes were discussed, including adopting a heart-healthy diet and increasing physical activity. The patient was encouraged to decrease her intake of high-sugar foods and beverages. She verbalized understanding and is aware of the plan of care.

## 2023-02-28 NOTE — Telephone Encounter (Signed)
Patient has submitted for STD, forms received. Please advise work status. If oow, please advise how long. Thank you!

## 2023-02-28 NOTE — Assessment & Plan Note (Addendum)
The patient's blood pressure is elevated in the clinic, despite no prior diagnosis of hypertension. He is currently asymptomatic. A follow-up appointment is scheduled in one week for nurse visits to monitor his blood pressure. If his blood pressure remains elevated, we will initiate therapy with an antihypertensive medication.  A low-sodium diet of less than 2,300 mg daily is recommended, along with increased physical activity of moderate intensity, aiming for 150 minutes per week. The patient is encouraged to continue these lifestyle modifications to help manage his blood pressure effectively.  BP Readings from Last 3 Encounters:  02/28/23 (!) 150/94  01/27/23 118/74  11/18/22 137/89

## 2023-03-01 LAB — CBC WITH DIFFERENTIAL/PLATELET
Basophils Absolute: 0 10*3/uL (ref 0.0–0.2)
Basos: 0 %
EOS (ABSOLUTE): 0 10*3/uL (ref 0.0–0.4)
Eos: 0 %
Hematocrit: 44.7 % (ref 37.5–51.0)
Hemoglobin: 14.6 g/dL (ref 13.0–17.7)
Immature Grans (Abs): 0.1 10*3/uL (ref 0.0–0.1)
Immature Granulocytes: 1 %
Lymphocytes Absolute: 1.9 10*3/uL (ref 0.7–3.1)
Lymphs: 18 %
MCH: 28.7 pg (ref 26.6–33.0)
MCHC: 32.7 g/dL (ref 31.5–35.7)
MCV: 88 fL (ref 79–97)
Monocytes Absolute: 0.6 10*3/uL (ref 0.1–0.9)
Monocytes: 5 %
Neutrophils Absolute: 7.8 10*3/uL — ABNORMAL HIGH (ref 1.4–7.0)
Neutrophils: 76 %
Platelets: 267 10*3/uL (ref 150–450)
RBC: 5.08 x10E6/uL (ref 4.14–5.80)
RDW: 13.4 % (ref 11.6–15.4)
WBC: 10.3 10*3/uL (ref 3.4–10.8)

## 2023-03-01 LAB — CMP14+EGFR
ALT: 12 [IU]/L (ref 0–44)
AST: 14 [IU]/L (ref 0–40)
Albumin: 4.5 g/dL (ref 3.8–4.9)
Alkaline Phosphatase: 94 [IU]/L (ref 44–121)
BUN/Creatinine Ratio: 21 — ABNORMAL HIGH (ref 9–20)
BUN: 12 mg/dL (ref 6–24)
Bilirubin Total: 0.3 mg/dL (ref 0.0–1.2)
CO2: 24 mmol/L (ref 20–29)
Calcium: 9.2 mg/dL (ref 8.7–10.2)
Chloride: 98 mmol/L (ref 96–106)
Creatinine, Ser: 0.56 mg/dL — ABNORMAL LOW (ref 0.76–1.27)
Globulin, Total: 2.5 g/dL (ref 1.5–4.5)
Glucose: 135 mg/dL — ABNORMAL HIGH (ref 70–99)
Potassium: 4.8 mmol/L (ref 3.5–5.2)
Sodium: 138 mmol/L (ref 134–144)
Total Protein: 7 g/dL (ref 6.0–8.5)
eGFR: 117 mL/min/{1.73_m2} (ref >59)

## 2023-03-01 LAB — VITAMIN D 25 HYDROXY (VIT D DEFICIENCY, FRACTURES): Vit D, 25-Hydroxy: 23.3 ng/mL — ABNORMAL LOW (ref 30.0–100.0)

## 2023-03-01 LAB — HEMOGLOBIN A1C
Est. average glucose Bld gHb Est-mCnc: 134 mg/dL
Hgb A1c MFr Bld: 6.3 % — ABNORMAL HIGH (ref 4.8–5.6)

## 2023-03-01 LAB — LIPID PANEL
Chol/HDL Ratio: 2.7 {ratio} (ref 0.0–5.0)
Cholesterol, Total: 124 mg/dL (ref 100–199)
HDL: 46 mg/dL (ref >39)
LDL Chol Calc (NIH): 64 mg/dL (ref 0–99)
Triglycerides: 65 mg/dL (ref 0–149)
VLDL Cholesterol Cal: 14 mg/dL (ref 5–40)

## 2023-03-01 LAB — TSH+FREE T4
Free T4: 1.12 ng/dL (ref 0.82–1.77)
TSH: 0.562 u[IU]/mL (ref 0.450–4.500)

## 2023-03-05 ENCOUNTER — Other Ambulatory Visit: Payer: Self-pay | Admitting: Family Medicine

## 2023-03-05 ENCOUNTER — Encounter: Payer: Self-pay | Admitting: Family Medicine

## 2023-03-05 DIAGNOSIS — E559 Vitamin D deficiency, unspecified: Secondary | ICD-10-CM

## 2023-03-05 MED ORDER — VITAMIN D (ERGOCALCIFEROL) 1.25 MG (50000 UNIT) PO CAPS: 50000.0000 [IU] | ORAL_CAPSULE | ORAL | 1 refills | Status: AC

## 2023-03-06 ENCOUNTER — Encounter: Payer: Self-pay | Admitting: Orthopedic Surgery

## 2023-03-07 ENCOUNTER — Ambulatory Visit: Payer: Managed Care, Other (non HMO)

## 2023-03-07 NOTE — Progress Notes (Signed)
Pt. Of Gilmore Laroche, MSN, APRN, FNP-C came to [facility] for B/P check after last appointment with provider on 02/28/23. Pt. Pressure reading equalled to 150/94. Pt was given B/P log to keep readings for one week and asked to return to have pressure rechecked in office.   Today in office readings are :   B/P- 123/84 Pulse- 83 Spo2- 92 Ht. - 5'10 Wt.- 223.04lb  Pt. Log readings for the past week :   02/28/23= A.M.-158/90, P.M.- 141/82 03/01/23= A.M.-146/86, P.M.-135/79 03/02/23= A.M.-136/48, P.M. -132/79 03/03/23= A.M.-135/87, P.M.- 141/79 03/04/23= A.M.-140/91, P.M.- 133/79 03/05/23= A.M.-137/89, P.M.- 144/92 03/06/23= A.M.-131/88, P.M.- 141/87 128/25 = A.M. -143/85  Due to the fluctuation of readings  throughout the week . Pt. Was asked to return in two weeks to repeat reading. Mr. Soderlund was asked to return with personal log to see if readings do improve.

## 2023-03-14 ENCOUNTER — Encounter: Payer: Self-pay | Admitting: *Deleted

## 2023-04-27 ENCOUNTER — Other Ambulatory Visit: Payer: Self-pay | Admitting: Family Medicine

## 2023-04-27 DIAGNOSIS — F419 Anxiety disorder, unspecified: Secondary | ICD-10-CM

## 2023-05-01 ENCOUNTER — Encounter: Payer: Self-pay | Admitting: Cardiovascular Disease

## 2023-05-01 MED ORDER — ATORVASTATIN CALCIUM 80 MG PO TABS: 80.0000 mg | ORAL_TABLET | Freq: Every day | ORAL | 2 refills | Status: AC

## 2023-05-20 ENCOUNTER — Other Ambulatory Visit: Payer: Self-pay | Admitting: Family Medicine

## 2023-05-20 DIAGNOSIS — F419 Anxiety disorder, unspecified: Secondary | ICD-10-CM

## 2023-05-26 NOTE — Progress Notes (Signed)
 Office Visit Note  Patient: Derrick Turner             Date of Birth: 12/13/1970           MRN: [id number]             PCP: Zarwolo, Gloria, FNP Referring: Del Orbe Polanco, Ilian* Visit Date: 06/09/2023 Occupation: @GUAROCC @  Subjective:  Pain in multiple joints  History of Present Illness: Derrick Turner is a 53 y.o. male seen for the evaluation of polyarthralgia and abnormal labs.  According the patient he has had pain and discomfort in multiple joints for more than 10 years.  The symptoms have gradually been getting worse.  He recalls having lumbar spine surgery and discectomy in 1997 and then in 2016 by Dr. Nat Badger.  He states he had injections in his lower back in the past which were not helpful.  He has also tried physical therapy.  He continues to have lower back pain.  He had right rotator cuff tear repair in 2014 and left rotator cuff tear repair in 2016.  He states despite of the surgery he continues to have pain and discomfort in both shoulders.  He states in November 2024 he had left index trigger finger release by Dr. Marce Sensing.  He states after 10 days when the dressing came off he noticed a lot of pain and swelling in his left hand.  He went to physical therapy but had continued discomfort.  At the time the labs were performed which were abnormal.  He has noticed pain and stiffness in his both hands for the last few years which has been gradually getting worse over the last 6 months.  He also has some discomfort in his cervical spine.  He had complains of discomfort in his right wrist and both hip joints.  None of the other joints are painful or swollen.  There is no personal history or family history of psoriasis.  There is no family history of inflammatory bowel disease.  He gives history of rheumatoid arthritis in maternal grandmother.  His mother had osteoarthritis.  He is married, left-handed, unemployed currently due to his medical condition.  He is to work as an  Personnel officer.  He enjoys fishing and gardening.  He also walks for exercise.  He drinks hard liquor mostly over the weekends.  He smokes 1 pack/day for the last 40 years.    Activities of Daily Living:  Patient reports morning stiffness for 30 minutes.   Patient Reports nocturnal pain.  Difficulty dressing/grooming: Reports Difficulty climbing stairs: Reports Difficulty getting out of chair: Reports Difficulty using hands for taps, buttons, cutlery, and/or writing: Reports  Review of Systems  Constitutional:  Positive for fatigue.  HENT:  Positive for mouth dryness. Negative for mouth sores.   Eyes:  Negative for dryness.  Respiratory:  Negative for shortness of breath.   Cardiovascular:  Negative for chest pain and palpitations.  Gastrointestinal:  Positive for diarrhea. Negative for blood in stool and constipation.  Endocrine: Negative for increased urination.  Genitourinary:  Negative for involuntary urination.  Musculoskeletal:  Positive for joint pain, joint pain, joint swelling, myalgias, muscle weakness, morning stiffness, muscle tenderness and myalgias. Negative for gait problem.  Skin:  Negative for color change, rash, hair loss and sensitivity to sunlight.  Allergic/Immunologic: Negative for susceptible to infections.  Neurological:  Negative for dizziness and headaches.  Hematological:  Negative for swollen glands.  Psychiatric/Behavioral:  Positive for depressed mood and sleep disturbance. The  patient is nervous/anxious.     PMFS History:  Patient Active Problem List   Diagnosis Date Noted   Elevated BP without diagnosis of hypertension 02/28/2023   Right hand pain 01/27/2023   Family history of prostate cancer in father 11/04/2022   Hematuria, gross 11/01/2022   Arthralgia 10/28/2022   Anxiety 06/10/2022   Generalized muscle ache 06/10/2022   Nephrolithiasis 04/05/2022   Lesion of left ear 11/05/2021   Tinnitus, bilateral 08/04/2021   Acute MI, inferior wall  (HCC)    S/P partial colectomy 09/02/2020   Dysplastic colon polyp    Cigarette nicotine  dependence without complication 09/26/2017   Chronic right-sided low back pain with right-sided sciatica 09/26/2017   Mixed hyperlipidemia 09/26/2017   CAD (coronary artery disease) 05/03/2016   Tobacco abuse    Prediabetes    Gastroesophageal reflux disease    ACS (acute coronary syndrome) (HCC) 03/31/2016   Pelvic floor dysfunction 01/14/2016   Urinary hesitancy 12/21/2015    Past Medical History:  Diagnosis Date   Coronary artery disease cardiologist--- dr croitoru   ETT 04-01-2016 in epic, reproduced chest pain but no EKG changes;  cardiac cath 04-06-2016 moderate diffuse nonobstructive disease, aggressive medical management   High cholesterol    History of 2019 novel coronavirus disease (COVID-19) 03/02/2020   per pt tested positive covid, copy of result in epic, stated mild symptoms that resolved   History of kidney stones    Incomplete right bundle branch block    Right ureteral calculus    Wears glasses     Family History  Problem Relation Age of Onset   Parkinson's disease Mother    Heart attack Father    Dementia Father    Heart attack Maternal Grandmother    Heart attack Maternal Grandfather    Colon cancer Neg Hx    Colon polyps Neg Hx    Past Surgical History:  Procedure Laterality Date   APPENDECTOMY  [age]   BACK SURGERY     two   BIOPSY  08/11/2020   Procedure: BIOPSY;  Surgeon: Vinetta Greening, DO;  Location: [facility];  Service: Endoscopy;;   COLONOSCOPY WITH PROPOFOL  N/A 08/11/2020   Procedure: COLONOSCOPY WITH PROPOFOL ;  Surgeon: Vinetta Greening, DO;  Location: [facility];  Service: Endoscopy;  Laterality: N/A;  7:30am   CORONARY/GRAFT ACUTE MI REVASCULARIZATION N/A 07/16/2021   Procedure: Coronary/Graft Acute MI Revascularization;  Surgeon: Odie Benne, MD;  Location: [facility];  Service: Cardiovascular;  Laterality: N/A;   CYSTOSCOPY  WITH RETROGRADE PYELOGRAM, URETEROSCOPY AND STENT PLACEMENT Right 04/14/2020   Procedure: CYSTOSCOPY WITH RETROGRADE PYELOGRAM, URETEROSCOPY AND STENT PLACEMENT;  Surgeon: Sherlyn Ditto, MD;  Location: [facility];  Service: Urology;  Laterality: Right;   EXTRACORPOREAL SHOCK WAVE LITHOTRIPSY Right 03/23/2020   Procedure: EXTRACORPOREAL SHOCK WAVE LITHOTRIPSY (ESWL);  Surgeon: Samson Croak, MD;  Location: [facility];  Service: Urology;  Laterality: Right;   HOLMIUM LASER APPLICATION Right 04/14/2020   Procedure: HOLMIUM LASER APPLICATION;  Surgeon: Sherlyn Ditto, MD;  Location: [facility];  Service: Urology;  Laterality: Right;   LAPAROSCOPIC CHOLECYSTECTOMY  2012 approx   LEFT HEART CATH AND CORONARY ANGIOGRAPHY N/A 04/06/2016   Procedure: Left Heart Cath and Coronary Angiography;  Surgeon: Arnoldo Lapping, MD;  Location: [facility] INVAS[facility] CV LAB;  Service: Cardiovascular;  Laterality: N/A;   LEFT HEART CATH AND CORONARY ANGIOGRAPHY N/A 07/16/2021   Procedure: LEFT HEART CATH AND CORONARY  ANGIOGRAPHY;  Surgeon: Odie Benne, MD;  Location: [facility] INVAS[facility] CV [facility];  Service: Cardiovascular;  Laterality: N/A;   ORCHIECTOMY Right 1990s   per pt benign cyst   PARTIAL COLECTOMY Right 09/02/2020   Procedure: HEMICOLECTOMY;  Surgeon: Alanda Allegra, MD;  Location: [facility];  Service: General;  Laterality: Right;   POLYPECTOMY  08/11/2020   Procedure: POLYPECTOMY;  Surgeon: Vinetta Greening, DO;  Location: [facility];  Service: Endoscopy;;   SHOULDER SURGERY Bilateral left 2019;  right 2016   SHOULDER SURGERY Left    SHOULDER SURGERY Right    Social History   Social History Narrative   Not on file   Immunization History  Administered Date(s) Administered   Influenza,inj,Quad PF,6+ Mos 11/07/2017   Moderna Sars-Covid-2 Vaccination 06/21/2019, 07/19/2019   Pneumococcal Polysaccharide-23 11/07/2017   Td (Adult),5 Lf Tetanus Toxid,  Preservative Free 06/20/1998   Tdap 08/04/2021   Zoster Recombinant(Shingrix ) 08/04/2021, 11/05/2021     Objective: Vital Signs: BP 138/80 (BP Location: Left Arm, Patient Position: Sitting, Cuff Size: Normal)   Pulse 75   Resp 17   Ht 5' 9.5" (1.765 m)   Wt 224 lb (101.6 kg)   BMI 32.60 kg/m    Physical Exam Vitals and nursing note reviewed.  Constitutional:      Appearance: He is well-developed.  HENT:     Head: Normocephalic and atraumatic.  Eyes:     Conjunctiva/sclera: Conjunctivae normal.     Pupils: Pupils are equal, round, and reactive to light.  Cardiovascular:     Rate and Rhythm: Normal rate and regular rhythm.     Heart sounds: Normal heart sounds.  Pulmonary:     Effort: Pulmonary effort is normal.     Breath sounds: Normal breath sounds.  Abdominal:     General: Bowel sounds are normal.     Palpations: Abdomen is soft.  Musculoskeletal:     Cervical back: Normal range of motion and neck supple.  Skin:    General: Skin is warm and dry.     Capillary Refill: Capillary refill takes less than 2 seconds.     Comments: Nail dystrophy was noted in his finger and toenails.  Neurological:     Mental Status: He is alert and oriented to person, place, and time.  Psychiatric:        Behavior: Behavior normal.      Musculoskeletal Exam: Spine was in good range of motion.  He had limited range of motion of his lumbar spine with some discomfort.  He had painful range of motion of bilateral shoulders.  He had good range of motion of bilateral shoulders.  Elbow joints, wrist joints were in good range of motion with no synovitis.  There was no synovitis over MCP joints.  He had bilateral PIP and DIP thickening.  Hip joints were in good range of motion with some discomfort on range of motion of the right hip joint.  Knee joints were in good range of motion without any warmth swelling or effusion.  Ankle joints were in good range of motion.  There was no tenderness over MTPs or  PIPs.  CDAI Exam: CDAI Score: -- Patient Global: --; Provider Global: -- Swollen: --; Tender: -- Joint Exam 06/09/2023   No joint exam has been documented for this visit   There is currently no information documented on the homunculus. Go to the Rheumatology activity and complete the homunculus joint exam.  Investigation: No additional findings.  Imaging: No results found.  Recent Labs: Lab Results  Component Value Date   WBC 10.3 02/28/2023   HGB 14.6 02/28/2023   PLT 267 02/28/2023   NA 138 02/28/2023   K 4.8 02/28/2023   CL 98 02/28/2023   CO2 24 02/28/2023   GLUCOSE 135 (H) 02/28/2023   BUN 12 02/28/2023   CREATININE 0.56 (L) 02/28/2023   BILITOT 0.3 02/28/2023   ALKPHOS 94 02/28/2023   AST 14 02/28/2023   ALT 12 02/28/2023   PROT 7.0 02/28/2023   ALBUMIN 4.5 02/28/2023   CALCIUM  9.2 02/28/2023   GFRAA 122 07/26/2019   February 28, 2023 LDL 64, hemoglobin A1c 6.3, TSH normal, vitamin D  23.3 January 17, 2023 ANA positive no titer given, RF negative November 04, 2022 UA negative except for trace RBC  Speciality Comments: No specialty comments available.  Procedures:  No procedures performed Allergies: Patient has no known allergies.   Assessment / Plan:     Visit Diagnoses: Positive ANA (antinuclear antibody) -patient was referred to me for the evaluation of positive ANA.  Patient states that he had left index trigger finger release in November 2024.  After the surgery he started having pain and swelling in his left hand.  The swelling gradually improved.  At that time the labs showed positive ANA.  He is concerned because there is family history of rheumatoid arthritis in maternal grandmother.  I will obtain additional labs.  He denies any history of oral ulcers, nasal ulcers, malar rash, photosensitivity, Raynaud's or lymphadenopathy.  01/17/23: ANA+, RF<10 - Plan: ANA, RNP Antibody, Anti-Smith antibody, Sjogrens syndrome-B extractable nuclear antibody,  Sjogrens syndrome-A extractable nuclear antibody, Anti-DNA antibody, double-stranded, C3 and C4, Anti-scleroderma antibody.  We will contact him once the lab results are available.  Polyarthralgia-he complained of pain in multiple joints over the years.  He gives history of stiffness and discomfort in his cervical spine, lower back, both shoulders, right wrist, both hands and both hips.  No synovitis was noted on the examination today.  Chronic pain of both shoulders - Status post bilateral rotator cuff tear repair.  He continues to have chronic shoulder pain.  He plans to schedule a follow-up appointment with the orthopedic surgeon.  He had good range of motion of bilateral shoulder joints today.  No warmth swelling or effusion was noted.  Pain in both hands -he complains of discomfort to his bilateral hands.  He gives history of pain in bilateral hands for many years which has been worse in the last 6 months since he had trigger finger release.  No synovitis was noted on the examination.  Bilateral PIP and DIP thickening was noted suggestive of osteoarthritis.  Detail concerning osteoarthritis was provided.  Patient has been going to physical therapy and had exercises for hands which she will continue to practice.  Plan: Cyclic citrul peptide antibody, IgG  Chronic pain of both hips-he complains of a stiffness and discomfort in his hip joints.  He had good range of motion today.  Lumbar spondylosis - Status post discectomy x 2.  Continues to have chronic lower back pain.  Patient has seen Dr. Christiane Cowing in the past.  He had surgery by Dr. Nat Badger.  Core strengthening exercises were discussed.  Onychomycosis-onychomycosis was noted on his finger and toenails.  I advised him to see dermatologist to discuss with his PCP.  Prediabetes  Vitamin D  deficiency - 02/28/23: vitamin D  23.3.  Patient has been taking vitamin D  50,000 units once a week.  Other medical problems are listed  as follows:  Coronary  artery disease involving native coronary artery of native heart without angina pectoris  History of MI (myocardial infarction)  Mixed hyperlipidemia  Dysplastic colon polyp  S/P partial colectomy  History of gastroesophageal reflux (GERD)  Nephrolithiasis  Cigarette nicotine  dependence without complication - 1 pack/day for 40 years .  Smoking cessation was discussed.  Association of smoking with autoimmune diseases was discussed.  Family history of prostate cancer in father  Orders: Orders Placed This Encounter  Procedures   ANA   RNP Antibody   Anti-Smith antibody   Sjogrens syndrome-B extractable nuclear antibody   Sjogrens syndrome-A extractable nuclear antibody   Anti-DNA antibody, double-stranded   C3 and C4   Anti-scleroderma antibody   Cyclic citrul peptide antibody, IgG   No orders of the defined types were placed in this encounter.    Follow-Up Instructions: Return for Osteoarthritis.   Nicholas Bari, MD  Note - This record has been created using Animal nutritionist.  Chart creation errors have been sought, but may not always  have been located. Such creation errors do not reflect on  the standard of medical care.

## 2023-06-09 ENCOUNTER — Ambulatory Visit: Payer: Managed Care, Other (non HMO) | Attending: Rheumatology | Admitting: Rheumatology

## 2023-06-09 ENCOUNTER — Encounter: Payer: Self-pay | Admitting: Rheumatology

## 2023-06-09 VITALS — BP 138/80 | HR 75 | Resp 17 | Ht 69.5 in | Wt 224.0 lb

## 2023-06-09 DIAGNOSIS — M79641 Pain in right hand: Secondary | ICD-10-CM | POA: Diagnosis not present

## 2023-06-09 DIAGNOSIS — M6289 Other specified disorders of muscle: Secondary | ICD-10-CM

## 2023-06-09 DIAGNOSIS — Z8042 Family history of malignant neoplasm of prostate: Secondary | ICD-10-CM

## 2023-06-09 DIAGNOSIS — M25551 Pain in right hip: Secondary | ICD-10-CM

## 2023-06-09 DIAGNOSIS — Z8719 Personal history of other diseases of the digestive system: Secondary | ICD-10-CM

## 2023-06-09 DIAGNOSIS — R7303 Prediabetes: Secondary | ICD-10-CM

## 2023-06-09 DIAGNOSIS — M25511 Pain in right shoulder: Secondary | ICD-10-CM | POA: Diagnosis not present

## 2023-06-09 DIAGNOSIS — I2510 Atherosclerotic heart disease of native coronary artery without angina pectoris: Secondary | ICD-10-CM

## 2023-06-09 DIAGNOSIS — N200 Calculus of kidney: Secondary | ICD-10-CM

## 2023-06-09 DIAGNOSIS — R768 Other specified abnormal immunological findings in serum: Secondary | ICD-10-CM

## 2023-06-09 DIAGNOSIS — B351 Tinea unguium: Secondary | ICD-10-CM

## 2023-06-09 DIAGNOSIS — M25512 Pain in left shoulder: Secondary | ICD-10-CM

## 2023-06-09 DIAGNOSIS — M25552 Pain in left hip: Secondary | ICD-10-CM

## 2023-06-09 DIAGNOSIS — I252 Old myocardial infarction: Secondary | ICD-10-CM

## 2023-06-09 DIAGNOSIS — Z9049 Acquired absence of other specified parts of digestive tract: Secondary | ICD-10-CM

## 2023-06-09 DIAGNOSIS — M2550 Pain in unspecified joint: Secondary | ICD-10-CM

## 2023-06-09 DIAGNOSIS — M79642 Pain in left hand: Secondary | ICD-10-CM

## 2023-06-09 DIAGNOSIS — I2119 ST elevation (STEMI) myocardial infarction involving other coronary artery of inferior wall: Secondary | ICD-10-CM

## 2023-06-09 DIAGNOSIS — E782 Mixed hyperlipidemia: Secondary | ICD-10-CM

## 2023-06-09 DIAGNOSIS — G8929 Other chronic pain: Secondary | ICD-10-CM

## 2023-06-09 DIAGNOSIS — K635 Polyp of colon: Secondary | ICD-10-CM

## 2023-06-09 DIAGNOSIS — E559 Vitamin D deficiency, unspecified: Secondary | ICD-10-CM

## 2023-06-09 DIAGNOSIS — F17210 Nicotine dependence, cigarettes, uncomplicated: Secondary | ICD-10-CM

## 2023-06-09 DIAGNOSIS — I249 Acute ischemic heart disease, unspecified: Secondary | ICD-10-CM

## 2023-06-09 DIAGNOSIS — M47816 Spondylosis without myelopathy or radiculopathy, lumbar region: Secondary | ICD-10-CM

## 2023-06-09 NOTE — Patient Instructions (Signed)
 Osteoarthritis  Osteoarthritis is a type of arthritis. It refers to joint pain or joint disease. Osteoarthritis affects tissue that covers the ends of bones in joints (cartilage). Cartilage acts as a cushion between the bones and helps them move smoothly. Osteoarthritis occurs when cartilage in the joints gets worn down. Osteoarthritis is sometimes called "wear and tear" arthritis. Osteoarthritis is the most common form of arthritis. It often occurs in older people. It is a condition that gets worse over time. The joints most often affected by this condition are in the fingers, toes, hips, knees, and spine, including the neck and lower back. What are the causes? This condition is caused by the wearing down of cartilage that covers the ends of bones. What increases the risk? The following factors may make you more likely to develop this condition: Being age 55 or older. Obesity. Overuse of joints. Past injury of a joint. Past surgery on a joint. Family history of osteoarthritis. What are the signs or symptoms? The main symptoms of this condition are pain, swelling, and stiffness in the joint. Other symptoms may include: An enlarged joint. More pain and further damage caused by small pieces of bone or cartilage that break off and float inside of the joint. Small deposits of bone (osteophytes) that grow on the edges of the joint. A grating or scraping feeling inside the joint when you move it. Popping or creaking sounds when you move. Difficulty walking or exercising. An inability to grip items, twist your hand, or control the movements of your hands and fingers. How is this diagnosed? This condition may be diagnosed based on: Your medical history. A physical exam. Your symptoms. X-rays of the affected joints. Blood tests to rule out other types of arthritis. How is this treated? There is no cure for this condition, but treatment can help control pain and improve joint function. Treatment  may include a combination of therapies, such as: Pain relief techniques, such as: Applying heat and cold to the joint. Massage. A form of talk therapy called cognitive behavioral therapy (CBT). This therapy helps you set goals and follow up on the changes that you make. Medicines for pain and inflammation. The medicines can be taken by mouth or applied to the skin. They include: NSAIDs, such as ibuprofen . Prescription medicines. Strong anti-inflammatory medicines (corticosteroids). Certain nutritional supplements. A prescribed exercise program. You may work with a physical therapist. Assistive devices, such as a brace, wrap, splint, specialized glove, or cane. A weight control plan. Surgery, such as: An osteotomy. This is done to reposition the bones and relieve pain or to remove loose pieces of bone and cartilage. Joint replacement surgery. You may need this surgery if you have advanced osteoarthritis. Follow these instructions at home: Activity Rest your affected joints as told by your health care provider. Exercise as told by your provider. The provider may recommend specific types of exercise, such as: Strengthening exercises. These are done to strengthen the muscles that support joints affected by arthritis. Aerobic activities. These are exercises, such as brisk walking or water  aerobics, that increase your heart rate. Range-of-motion activities. These help your joints move more easily. Balance and agility exercises. Managing pain, stiffness, and swelling     If told, apply heat to the affected area as often as told by your provider. Use the heat source that your provider recommends, such as a moist heat pack or a heating pad. If you have a removable assistive device, remove it as told by your provider. Place a  towel between your skin and the heat source. If your provider tells you to keep the assistive device on while you apply heat, place a towel between the assistive device and  the heat source. Leave the heat on for 20-30 minutes. If told, put ice on the affected area. If you have a removable assistive device, remove it as told by your provider. Put ice in a plastic bag. Place a towel between your skin and the bag. If your provider tells you to keep the assistive device on during icing, place a towel between the assistive device and the bag. Leave the ice on for 20 minutes, 2-3 times a day. If your skin turns bright red, remove the ice or heat right away to prevent skin damage. The risk of damage is higher if you cannot feel pain, heat, or cold. Move your fingers or toes often to reduce stiffness and swelling. Raise (elevate) the affected area above the level of your heart while you are sitting or lying down. General instructions Take over-the-counter and prescription medicines only as told by your provider. Maintain a healthy weight. Follow instructions from your provider for weight control. Do not use any products that contain nicotine  or tobacco. These products include cigarettes, chewing tobacco, and vaping devices, such as e-cigarettes. If you need help quitting, ask your provider. Use assistive devices as told by your provider. Where to find more information [employer] of Arthritis and Musculoskeletal and Skin Diseases: niams.http://www.myers.net/ [employer] on Aging: BaseRingTones.pl American College of Rheumatology: rheumatology.org Contact a health care provider if: You have redness, swelling, or a feeling of warmth in a joint that gets worse. You have a fever along with joint or muscle aches. You develop a rash. You have trouble doing your normal activities. You have pain that gets worse and is not relieved by pain medicine. This information is not intended to replace advice given to you by your health care provider. Make sure you discuss any questions you have with your health care provider. Document Revised: 09/23/2021 Document Reviewed:  09/23/2021 Elsevier Patient Education  2024 [employer]  Shoulder Exercises Ask your health care provider which exercises are safe for you. Do exercises exactly as told by your health care provider and adjust them as directed. It is normal to feel mild stretching, pulling, tightness, or discomfort as you do these exercises. Stop right away if you feel sudden pain or your pain gets worse. Do not begin these exercises until told by your health care provider. Stretching exercises External rotation and abduction This exercise is sometimes called corner stretch. The exercise rotates your arm outward (external rotation) and moves your arm out from your body (abduction). Stand in a doorway with one of your feet slightly in front of the other. This is called a staggered stance. If you cannot reach your forearms to the door frame, stand facing a corner of a room. Choose one of the following positions as told by your health care provider: Place your hands and forearms on the door frame above your head. Place your hands and forearms on the door frame at the height of your head. Place your hands on the door frame at the height of your elbows. Slowly move your weight onto your front foot until you feel a stretch across your chest and in the front of your shoulders. Keep your head and chest upright and keep your abdominal muscles tight. Hold for __________ seconds. To release the stretch, shift your weight to your back foot.  Repeat __________ times. Complete this exercise __________ times a day. Extension, standing  Stand and hold a broomstick, a cane, or a similar object behind your back. Your hands should be a little wider than shoulder-width apart. Your palms should face away from your back. Keeping your elbows straight and your shoulder muscles relaxed, move the stick away from your body until you feel a stretch in your shoulders (extension). Avoid shrugging your shoulders while you move the stick.  Keep your shoulder blades tucked down toward the middle of your back. Hold for __________ seconds. Slowly return to the starting position. Repeat __________ times. Complete this exercise __________ times a day. Range-of-motion exercises Pendulum  Stand near a wall or a surface that you can hold onto for balance. Bend at the waist and let your left / right arm hang straight down. Use your other arm to support you. Keep your back straight and do not lock your knees. Relax your left / right arm and shoulder muscles, and move your hips and your trunk so your left / right arm swings freely. Your arm should swing because of the motion of your body, not because you are using your arm or shoulder muscles. Keep moving your hips and trunk so your arm swings in the following directions, as told by your health care provider: Side to side. Forward and backward. In clockwise and counterclockwise circles. Continue each motion for __________ seconds, or for as long as told by your health care provider. Slowly return to the starting position. Repeat __________ times. Complete this exercise __________ times a day. Shoulder flexion, standing  Stand and hold a broomstick, a cane, or a similar object. Place your hands a little more than shoulder-width apart on the object. Your left / right hand should be palm-up, and your other hand should be palm-down. Keep your elbow straight and your shoulder muscles relaxed. Push the stick up with your healthy arm to raise your left / right arm in front of your body, and then over your head until you feel a stretch in your shoulder (flexion). Avoid shrugging your shoulder while you raise your arm. Keep your shoulder blade tucked down toward the middle of your back. Hold for __________ seconds. Slowly return to the starting position. Repeat __________ times. Complete this exercise __________ times a day. Shoulder abduction, standing  Stand and hold a broomstick, a cane, or  a similar object. Place your hands a little more than shoulder-width apart on the object. Your left / right hand should be palm-up, and your other hand should be palm-down. Keep your elbow straight and your shoulder muscles relaxed. Push the object across your body toward your left / right side. Raise your left / right arm to the side of your body (abduction) until you feel a stretch in your shoulder. Do not raise your arm above shoulder height unless your health care provider tells you to do that. If directed, raise your arm over your head. Avoid shrugging your shoulder while you raise your arm. Keep your shoulder blade tucked down toward the middle of your back. Hold for __________ seconds. Slowly return to the starting position. Repeat __________ times. Complete this exercise __________ times a day. Internal rotation  Place your left / right hand behind your back, palm-up. Use your other hand to dangle an exercise band, a broomstick, or a similar object over your shoulder. Grasp the band with your left / right hand so you are holding on to both ends. Gently pull up  on the band until you feel a stretch in the front of your left / right shoulder. The movement of your arm toward the center of your body is called internal rotation. Avoid shrugging your shoulder while you raise your arm. Keep your shoulder blade tucked down toward the middle of your back. Hold for __________ seconds. Release the stretch by letting go of the band and lowering your hands. Repeat __________ times. Complete this exercise __________ times a day. Strengthening exercises External rotation  Sit in a stable chair without armrests. Secure an exercise band to a stable object at elbow height on your left / right side. Place a soft object, such as a folded towel or a small pillow, between your left / right upper arm and your body to move your elbow about 4 inches (10 cm) away from your side. Hold the end of the exercise band  so it is tight and there is no slack. Keeping your elbow pressed against the soft object, slowly move your forearm out, away from your abdomen (external rotation). Keep your body steady so only your forearm moves. Hold for __________ seconds. Slowly return to the starting position. Repeat __________ times. Complete this exercise __________ times a day. Shoulder abduction  Sit in a stable chair without armrests, or stand up. Hold a __________ lb / kg weight in your left / right hand, or hold an exercise band with both hands. Start with your arms straight down and your left / right palm facing in, toward your body. Slowly lift your left / right hand out to your side (abduction). Do not lift your hand above shoulder height unless your health care provider tells you that this is safe. Keep your arms straight. Avoid shrugging your shoulder while you do this movement. Keep your shoulder blade tucked down toward the middle of your back. Hold for __________ seconds. Slowly lower your arm, and return to the starting position. Repeat __________ times. Complete this exercise __________ times a day. Shoulder extension  Sit in a stable chair without armrests, or stand up. Secure an exercise band to a stable object in front of you so it is at shoulder height. Hold one end of the exercise band in each hand. Straighten your elbows and lift your hands up to shoulder height. Squeeze your shoulder blades together as you pull your hands down to the sides of your thighs (extension). Stop when your hands are straight down by your sides. Do not let your hands go behind your body. Hold for __________ seconds. Slowly return to the starting position. Repeat __________ times. Complete this exercise __________ times a day. Shoulder row  Sit in a stable chair without armrests, or stand up. Secure an exercise band to a stable object in front of you so it is at chest height. Hold one end of the exercise band in each  hand. Position your palms so that your thumbs are facing the ceiling (neutral position). Bend each of your elbows to a 90-degree angle (right angle) and keep your upper arms at your sides. Step back or move the chair back until the band is tight and there is no slack. Slowly pull your elbows back behind you. Hold for __________ seconds. Slowly return to the starting position. Repeat __________ times. Complete this exercise __________ times a day. Shoulder press-ups  Sit in a stable chair that has armrests. Sit upright, with your feet flat on the floor. Put your hands on the armrests so your elbows are bent and  your fingers are pointing forward. Your hands should be about even with the sides of your body. Push down on the armrests and use your arms to lift yourself off the chair. Straighten your elbows and lift yourself up as much as you comfortably can. Move your shoulder blades down, and avoid letting your shoulders move up toward your ears. Keep your feet on the ground. As you get stronger, your feet should support less of your body weight as you lift yourself up. Hold for __________ seconds. Slowly lower yourself back into the chair. Repeat __________ times. Complete this exercise __________ times a day. Wall push-ups  Stand so you are facing a stable wall. Your feet should be about one arm-length away from the wall. Lean forward and place your palms on the wall at shoulder height. Keep your feet flat on the floor as you bend your elbows and lean forward toward the wall. Hold for __________ seconds. Straighten your elbows to push yourself back to the starting position. Repeat __________ times. Complete this exercise __________ times a day. This information is not intended to replace advice given to you by your health care provider. Make sure you discuss any questions you have with your health care provider. Document Revised: 03/16/2021 Document Reviewed: 03/16/2021 Elsevier Patient  Education  2024 [employer].

## 2023-06-11 NOTE — Progress Notes (Signed)
 All labs are normal except positive RNP antibody.  I will discuss results at the follow-up visit.

## 2023-06-12 LAB — C3 AND C4
C3 Complement: 136 mg/dL (ref 82–185)
C4 Complement: 25 mg/dL (ref 15–53)

## 2023-06-12 LAB — ANA: Anti Nuclear Antibody (ANA): NEGATIVE

## 2023-06-12 LAB — ANTI-SCLERODERMA ANTIBODY: Scleroderma (Scl-70) (ENA) Antibody, IgG: <1 AI

## 2023-06-12 LAB — ANTI-SMITH ANTIBODY: ENA SM Ab Ser-aCnc: <1 AI

## 2023-06-12 LAB — ANTI-DNA ANTIBODY, DOUBLE-STRANDED: ds DNA Ab: <1 [IU]/mL

## 2023-06-12 LAB — RNP ANTIBODY: Ribonucleic Protein(ENA) Antibody, IgG: 4 AI — AB

## 2023-06-12 LAB — SJOGRENS SYNDROME-A EXTRACTABLE NUCLEAR ANTIBODY: SSA (Ro) (ENA) Antibody, IgG: <1 AI

## 2023-06-12 LAB — CYCLIC CITRUL PEPTIDE ANTIBODY, IGG: Cyclic Citrullin Peptide Ab: <16 U

## 2023-06-12 LAB — SJOGRENS SYNDROME-B EXTRACTABLE NUCLEAR ANTIBODY: SSB (La) (ENA) Antibody, IgG: <1 AI

## 2023-06-16 IMAGING — DX DG CHEST 2V
2 series · 2 of 2 positions shown · non-contrast
Comparison: 03/31/2016

CLINICAL DATA: 50-year-old male preoperative chest x-ray

EXAM:
CHEST - 2 VIEW

[chest pa]
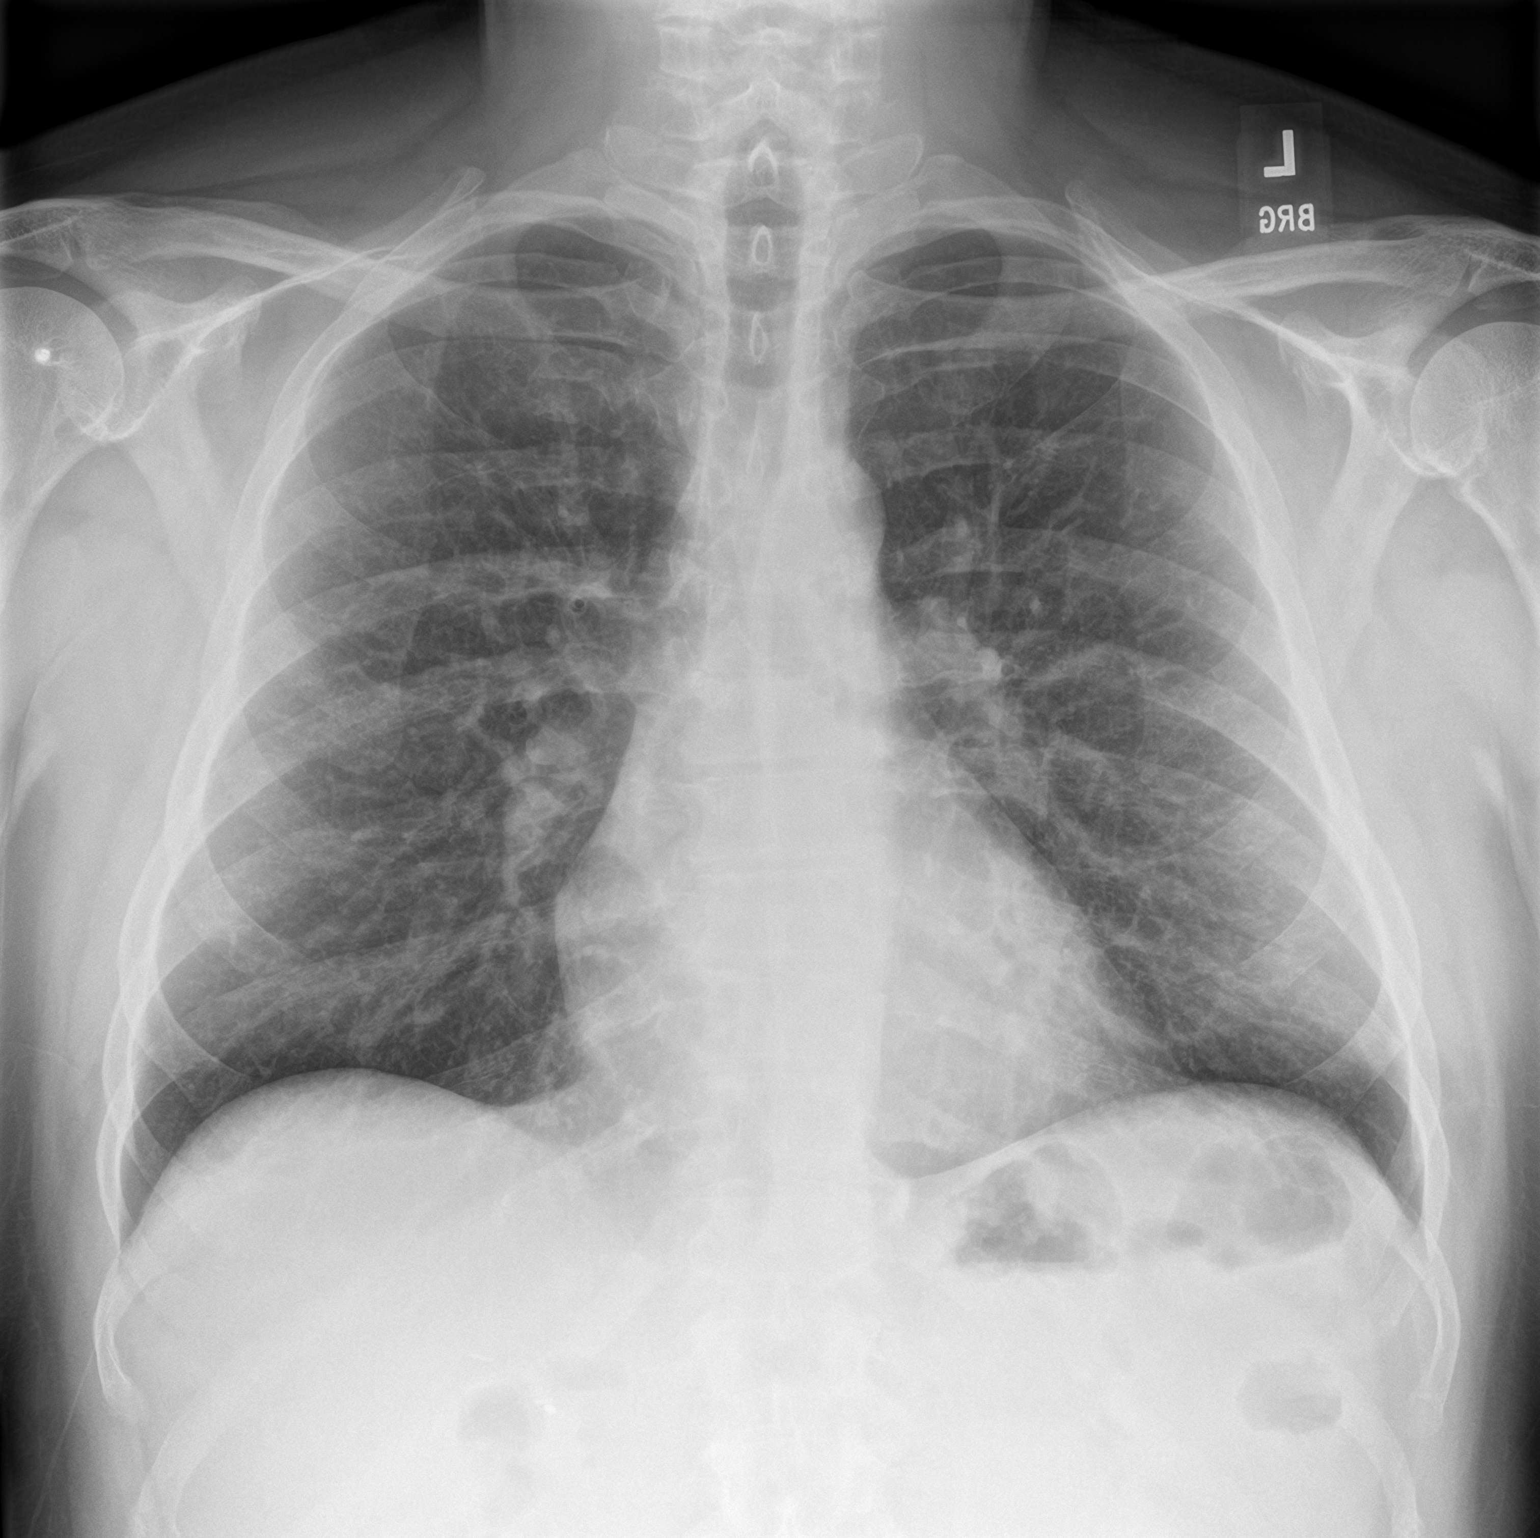

[chest lat]
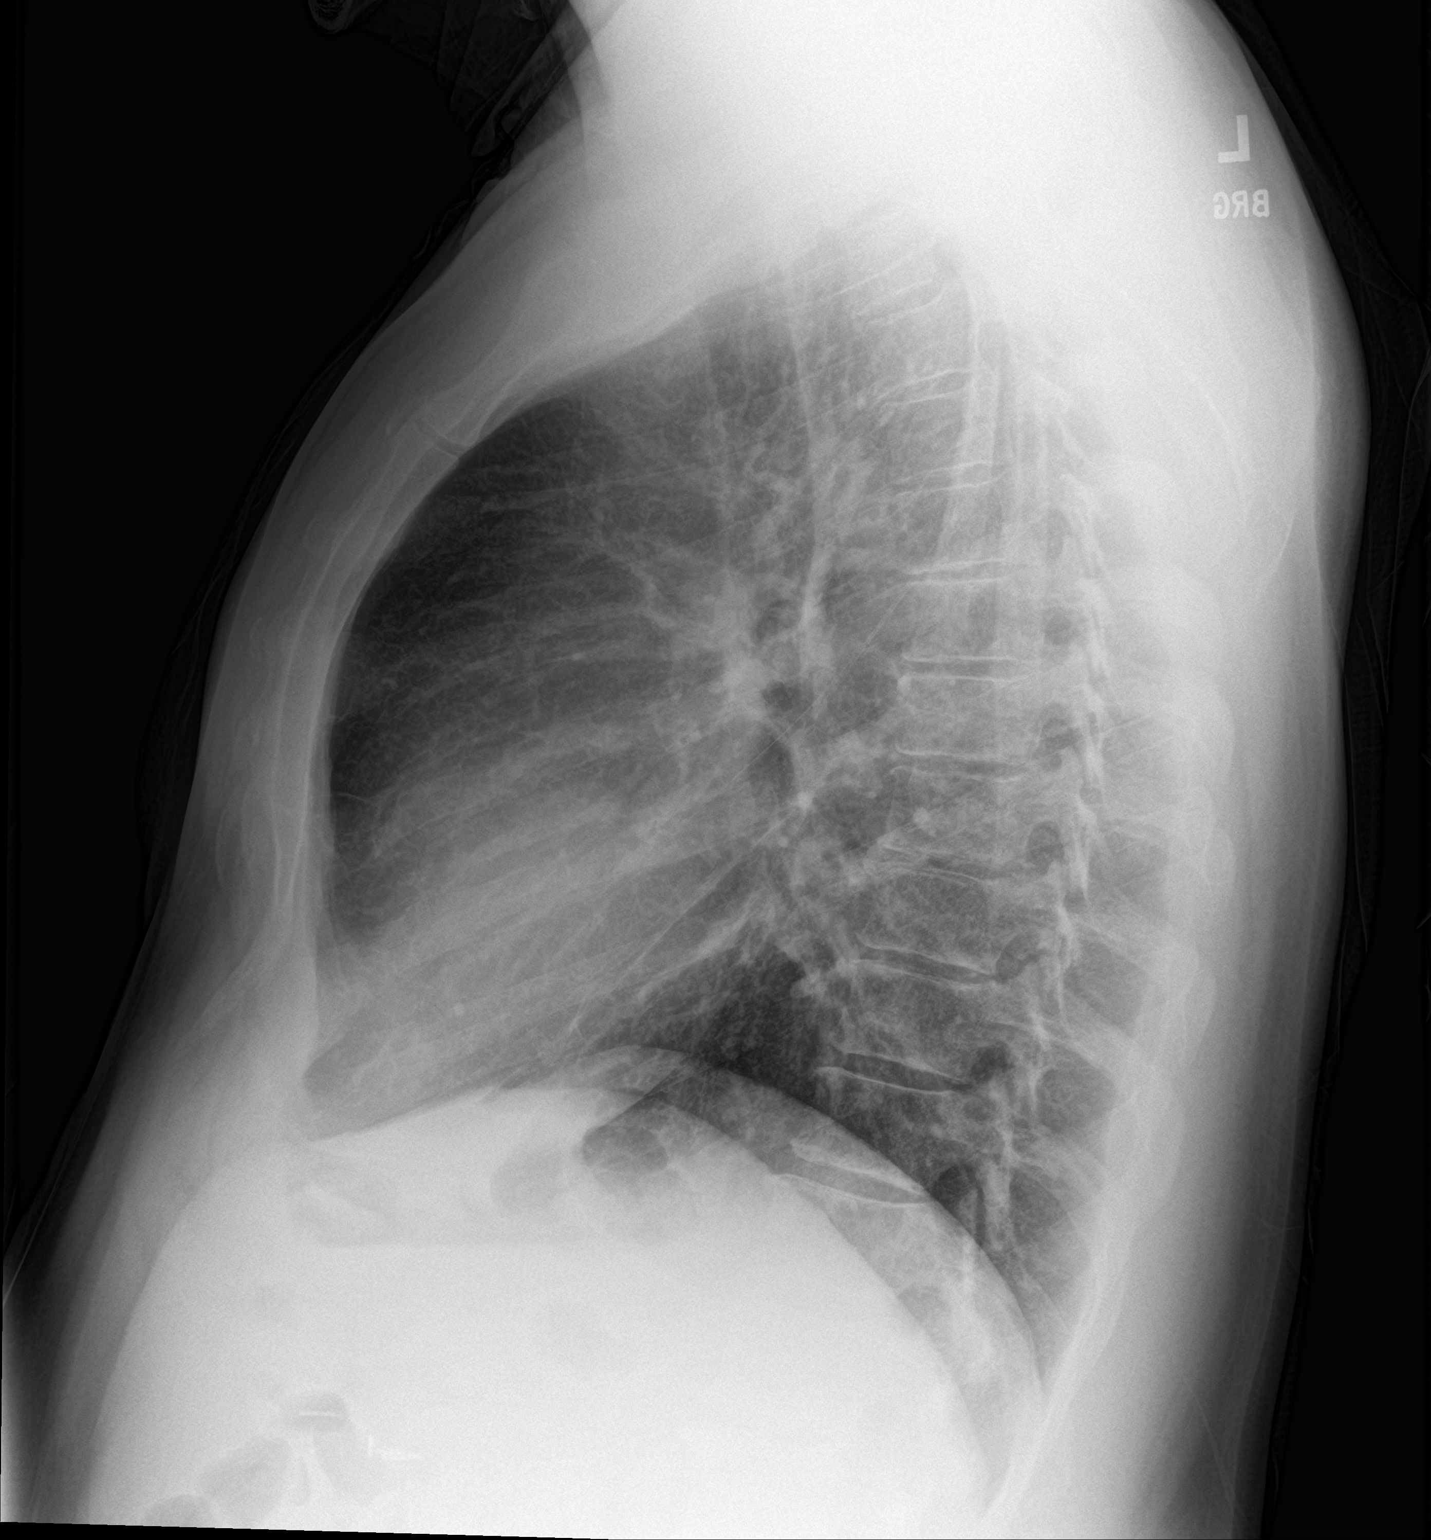

[2 of 2 positions shown; findings below may reference images not displayed]

FINDINGS: Cardiomediastinal silhouette unchanged in size and contour. No
evidence of central vascular congestion. No interlobular septal
thickening.

No pneumothorax or pleural effusion. Coarsened interstitial
markings, with no confluent airspace disease.

No acute displaced fracture. Degenerative changes of the spine.
IMPRESSION: Negative for acute cardiopulmonary disease

## 2023-06-28 ENCOUNTER — Ambulatory Visit: Admitting: Orthopedic Surgery

## 2023-06-28 ENCOUNTER — Encounter: Payer: Self-pay | Admitting: Orthopedic Surgery

## 2023-06-28 VITALS — BP 132/83 | HR 92 | Ht 69.5 in | Wt 226.0 lb

## 2023-06-28 DIAGNOSIS — G8929 Other chronic pain: Secondary | ICD-10-CM

## 2023-06-28 DIAGNOSIS — M25512 Pain in left shoulder: Secondary | ICD-10-CM | POA: Diagnosis not present

## 2023-06-28 DIAGNOSIS — M25511 Pain in right shoulder: Secondary | ICD-10-CM

## 2023-06-28 NOTE — Patient Instructions (Signed)
 We will obtain MRIs of both shoulders.  When she have confirmed a date for the MRI, please contact the clinic schedule follow-up appointment.  Typically, we will see patients approximately 10 days after the date of the MRI.

## 2023-06-28 NOTE — Progress Notes (Signed)
 Return patient Visit  Assessment: Derrick Turner is a 53 y.o. male with the following: 1. Chronic pain of both shoulders; history of bilateral shoulder surgery.  Plan: Derrick Turner continues to have pain in bilateral shoulders.  Have not seen him in several months.  Prior injections provided excellent relief of his symptoms for short period of time.  He states he only had improvements for about a week.  He has difficulty with overhead motion.  He is unable to work as a result.  Positive drop arm test.  X-ray result, I have recommended obtaining MRIs of both shoulders.  We will see him in clinic to discuss the findings.   Follow-up: Return for After MRI.  Subjective:  Chief Complaint  Patient presents with   Shoulder Pain    Bil R > L states injections only help for about a wk and pain went back to how it was before. Has had sub scap sx on both shoulders approx '13     History of Present Illness: Derrick Turner is a 53 y.o. male who returns for evaluation of bilateral shoulder pain.  Right is worse than left.  He has a history of surgery of both shoulders.  Recently, he started develop pain.  He has difficulty with overhead motion.  This is affecting his work.  He is an Personnel officer.  He is unable to complete all tasks.  As a result, he has been out of work.   Review of Systems: No fevers or chills No numbness or tingling No chest pain No shortness of breath No bowel or bladder dysfunction No GI distress No headaches    Objective: BP 132/83   Pulse 92   Ht 5' 9.5" (1.765 m)   Wt 226 lb (102.5 kg)   BMI 32.90 kg/m   Physical Exam:  General: Alert and oriented. and No acute distress. Gait: Normal gait.  Bilateral shoulders with well healed anterior based surgical incisions.  No surrounding erythema or drainage.  160 FF.  IR to Lumbar spine.  Exaggerated response with strength testing.  He had some weakness that was affected by effort and pain.  Positive drop arm  test.  Positive Jobe's.       IMAGING: I personally ordered and reviewed the following images  No new imaging obtained today.   New Medications:  No orders of the defined types were placed in this encounter.     Tonita Frater, MD  06/28/2023 12:04 PM

## 2023-06-30 ENCOUNTER — Ambulatory Visit (INDEPENDENT_AMBULATORY_CARE_PROVIDER_SITE_OTHER): Payer: Self-pay | Admitting: Family Medicine

## 2023-06-30 ENCOUNTER — Encounter: Payer: Self-pay | Admitting: Family Medicine

## 2023-06-30 VITALS — BP 138/88 | HR 83 | Ht 69.5 in | Wt 228.1 lb

## 2023-06-30 DIAGNOSIS — E559 Vitamin D deficiency, unspecified: Secondary | ICD-10-CM | POA: Diagnosis not present

## 2023-06-30 DIAGNOSIS — R197 Diarrhea, unspecified: Secondary | ICD-10-CM

## 2023-06-30 DIAGNOSIS — E038 Other specified hypothyroidism: Secondary | ICD-10-CM

## 2023-06-30 DIAGNOSIS — Z9049 Acquired absence of other specified parts of digestive tract: Secondary | ICD-10-CM

## 2023-06-30 DIAGNOSIS — E7849 Other hyperlipidemia: Secondary | ICD-10-CM

## 2023-06-30 DIAGNOSIS — R7301 Impaired fasting glucose: Secondary | ICD-10-CM | POA: Diagnosis not present

## 2023-06-30 MED ORDER — COLESEVELAM HCL 625 MG PO TABS: 1875.0000 mg | ORAL_TABLET | Freq: Two times a day (BID) | ORAL | 0 refills | Status: AC

## 2023-06-30 NOTE — Progress Notes (Addendum)
 Established Patient Office Visit  Subjective:  Patient ID: Derrick Turner, male    DOB: 01/06/1971  Age: 53 y.o. MRN: [id number]  CC:  Chief Complaint  Patient presents with   Medical Management of Chronic Issues    4 month f/u   Abdominal Pain    Pt reports diarrhea, abdominal pain thinks it could be the meloxicam . Ongoing for 1 month.     HPI Derrick Turner is a 53 y.o. male with past medical history of hyperlipidemia, anxiety presents for f/u of  chronic medical conditions. For the details of today's visit, please refer to the assessment and plan.    Diarrhea: The patient presents with a one-month history of diarrhea characterized by 4-5 loose, watery bowel movements daily, primarily occurring in the morning between 6 AM and lunchtime. Stools are non-bloody and without mucus or pus. He reports associated right lower quadrant abdominal cramping and discomfort, which is persistent and described as a dull, uncomfortable sensation separate from bowel movements. The symptoms are not relieved by defecation.  He denies recent travel, consumption of undercooked meat, or recent antibiotic use. His medical history is significant for a partial colectomy in 2022, during which 30% of the small intestine was removed due to diverticulitis. He was advised to return for a follow-up colonoscopy postoperatively but did not follow up. He reports no recent dietary changes, and the diarrhea appears independent of food or fluid intake, although he notes it worsens with fatty foods.  The patient has been self-treating with over-the-counter probiotics without significant relief.   Past Medical History:  Diagnosis Date   Coronary artery disease cardiologist--- dr croitoru   ETT 04-01-2016 in epic, reproduced chest pain but no EKG changes;  cardiac cath 04-06-2016 moderate diffuse nonobstructive disease, aggressive medical management   High cholesterol    History of 2019 novel coronavirus disease  (COVID-19) 03/02/2020   per pt tested positive covid, copy of result in epic, stated mild symptoms that resolved   History of kidney stones    Incomplete right bundle branch block    Right ureteral calculus    Wears glasses     Past Surgical History:  Procedure Laterality Date   APPENDECTOMY  teen   BACK SURGERY     two   BIOPSY  08/11/2020   Procedure: BIOPSY;  Surgeon: Vinetta Greening, DO;  Location: [facility];  Service: Endoscopy;;   COLONOSCOPY WITH PROPOFOL  N/A 08/11/2020   Procedure: COLONOSCOPY WITH PROPOFOL ;  Surgeon: Vinetta Greening, DO;  Location: [facility];  Service: Endoscopy;  Laterality: N/A;  7:30am   CORONARY/GRAFT ACUTE MI REVASCULARIZATION N/A 07/16/2021   Procedure: Coronary/Graft Acute MI Revascularization;  Surgeon: Odie Benne, MD;  Location: [facility];  Service: Cardiovascular;  Laterality: N/A;   CYSTOSCOPY WITH RETROGRADE PYELOGRAM, URETEROSCOPY AND STENT PLACEMENT Right 04/14/2020   Procedure: CYSTOSCOPY WITH RETROGRADE PYELOGRAM, URETEROSCOPY AND STENT PLACEMENT;  Surgeon: Sherlyn Ditto, MD;  Location: [facility];  Service: Urology;  Laterality: Right;   EXTRACORPOREAL SHOCK WAVE LITHOTRIPSY Right 03/23/2020   Procedure: EXTRACORPOREAL SHOCK WAVE LITHOTRIPSY (ESWL);  Surgeon: Samson Croak, MD;  Location: [facility];  Service: Urology;  Laterality: Right;   HOLMIUM LASER APPLICATION Right 04/14/2020   Procedure: HOLMIUM LASER APPLICATION;  Surgeon: Sherlyn Ditto, MD;  Location: [facility];  Service: Urology;  Laterality: Right;   LAPAROSCOPIC CHOLECYSTECTOMY  2012 approx   LEFT HEART CATH AND CORONARY ANGIOGRAPHY  N/A 04/06/2016   Procedure: Left Heart Cath and Coronary Angiography;  Surgeon: Arnoldo Lapping, MD;  Location: [facility];  Service: Cardiovascular;  Laterality: N/A;   LEFT HEART CATH AND CORONARY ANGIOGRAPHY N/A 07/16/2021   Procedure: LEFT HEART CATH AND  CORONARY ANGIOGRAPHY;  Surgeon: Odie Benne, MD;  Location: [facility];  Service: Cardiovascular;  Laterality: N/A;   ORCHIECTOMY Right 1990s   per pt benign cyst   PARTIAL COLECTOMY Right 09/02/2020   Procedure: HEMICOLECTOMY;  Surgeon: Alanda Allegra, MD;  Location: [facility];  Service: General;  Laterality: Right;   POLYPECTOMY  08/11/2020   Procedure: POLYPECTOMY;  Surgeon: Vinetta Greening, DO;  Location: [facility];  Service: Endoscopy;;   SHOULDER SURGERY Bilateral left 2019;  right 2016   SHOULDER SURGERY Left    SHOULDER SURGERY Right     Family History  Problem Relation Age of Onset   Parkinson's disease Mother    Heart attack Father    Dementia Father    Heart attack Maternal Grandmother    Heart attack Maternal Grandfather    Colon cancer Neg Hx    Colon polyps Neg Hx     Social History   Socioeconomic History   Marital status: Married    Spouse name: Not on file   Number of children: Not on file   Years of education: Not on file   Highest education level: 12th grade  Occupational History   Not on file  Tobacco Use   Smoking status: Every Day    Current packs/day: 1.50    Average packs/day: 1.5 packs/day for 35.0 years (52.5 ttl pk-yrs)    Types: Cigarettes    Passive exposure: Current   Smokeless tobacco: Never   Tobacco comments:    11/08/2022 Patient smokes 1/2 pack daily  Vaping Use   Vaping status: Never Used  Substance and Sexual Activity   Alcohol use: Yes    Comment: occasional   Drug use: Never   Sexual activity: Yes  Other Topics Concern   Not on file  Social History Narrative   Not on file   Social Drivers of Health   Financial Resource Strain: Low Risk  (01/26/2023)   Overall Financial Resource Strain (CARDIA)    Difficulty of Paying Living Expenses: Not very hard  Food Insecurity: No Food Insecurity (01/26/2023)   Hunger Vital Sign    Worried About Running Out of Food in the Last Year: Never true    Ran Out of  Food in the Last Year: Never true  Transportation Needs: No Transportation Needs (01/26/2023)   PRAPARE - Administrator, Civil Service (Medical): No    Lack of Transportation (Non-Medical): No  Physical Activity: Insufficiently Active (01/26/2023)   Exercise Vital Sign    Days of Exercise per Week: 6 days    Minutes of Exercise per Session: 20 min  Stress: Stress Concern Present (01/26/2023)   [employer] of Occupational Health - Occupational Stress Questionnaire    Feeling of Stress : To some extent  Social Connections: Moderately Integrated (01/26/2023)   Social Connection and Isolation Panel [NHANES]    Frequency of Communication with Friends and Family: More than three times a week    Frequency of Social Gatherings with Friends and Family: Once a week    Attends Religious Services: More than 4 times per year    Active Member of [employer] or Organizations: No    Attends Banker Meetings: Not  on file    Marital Status: Married  Intimate Partner Violence: Unknown (06/07/2022)   Received from [facility]   HITS    Physically Hurt: Not on file    Insult or Talk Down To: Not on file    Threaten Physical Harm: Not on file    Scream or Curse: Not on file    Outpatient Medications Prior to Visit  Medication Sig Dispense Refill   atorvastatin  (LIPITOR ) 80 MG tablet Take 1 tablet (80 mg total) by mouth daily. 90 tablet 2   azelastine  (ASTELIN ) 0.1 % nasal spray Place 2 sprays into both nostrils 2 (two) times daily. 1 mL 2   cetirizine (ZYRTEC) 10 MG tablet Take 10 mg by mouth daily.     citalopram  (CELEXA ) 20 MG tablet TAKE 1 TABLET BY MOUTH EVERY DAY 90 tablet 2   cyclobenzaprine  (FLEXERIL ) 5 MG tablet Take 1 tablet (5 mg total) by mouth 3 (three) times daily as needed for muscle spasms. 30 tablet 1   docusate sodium (COLACE) 100 MG capsule Take 100 mg by mouth at bedtime.     hydrOXYzine  (VISTARIL ) 25 MG capsule TAKE 1 CAPSULE (25 MG TOTAL)  BY MOUTH EVERY 8 (EIGHT) HOURS AS NEEDED. 270 capsule 1   meloxicam  (MOBIC ) 7.5 MG tablet Take 1 tablet (7.5 mg total) by mouth daily. 60 tablet 1   predniSONE  (STERAPRED UNI-PAK 21 TAB) 10 MG (21) TBPK tablet Take as directed 21 tablet 0   Probiotic Product (PROBIOTIC PO) Take 1 capsule by mouth daily.     triamcinolone  cream (KENALOG ) 0.1 % Apply 1 Application topically 2 (two) times daily. 80 g 0   Vitamin D , Ergocalciferol , (DRISDOL ) 1.25 MG (50000 UNIT) CAPS capsule Take 1 capsule (50,000 Units total) by mouth every 7 (seven) days. 20 capsule 1   No facility-administered medications prior to visit.    No Known Allergies  ROS Review of Systems  Constitutional:  Negative for fatigue and fever.  Eyes:  Negative for visual disturbance.  Respiratory:  Negative for chest tightness and shortness of breath.   Cardiovascular:  Negative for chest pain and palpitations.  Gastrointestinal:  Positive for diarrhea.  Neurological:  Negative for dizziness and headaches.      Objective:     Physical Exam HENT:     Head: Normocephalic.     Right Ear: External ear normal.     Left Ear: External ear normal.     Nose: No congestion or rhinorrhea.     Mouth/Throat:     Mouth: Mucous membranes are moist.  Cardiovascular:     Rate and Rhythm: Regular rhythm.     Heart sounds: No murmur heard. Pulmonary:     Effort: No respiratory distress.     Breath sounds: Normal breath sounds.  Abdominal:     General: Bowel sounds are normal.     Tenderness: There is no abdominal tenderness. There is no guarding or rebound. Negative signs include Murphy's sign and Rovsing's sign.  Neurological:     Mental Status: He is alert.     BP 138/88 (BP Location: Left Arm)   Pulse 83   Ht 5' 9.5" (1.765 m)   Wt 228 lb 1.9 oz (103.5 kg)   SpO2 95%   BMI 33.20 kg/m  Wt Readings from Last 3 Encounters:  06/30/23 228 lb 1.9 oz (103.5 kg)  06/28/23 226 lb (102.5 kg)  06/09/23 224 lb (101.6 kg)    Lab  Results  Component Value  Date   TSH 3.770 06/30/2023   Lab Results  Component Value Date   WBC 9.2 06/30/2023   HGB 16.0 06/30/2023   HCT 47.9 06/30/2023   MCV 89 06/30/2023   PLT 202 06/30/2023   Lab Results  Component Value Date   NA 140 06/30/2023   K 4.8 06/30/2023   CO2 24 06/30/2023   GLUCOSE 107 (H) 06/30/2023   BUN 14 06/30/2023   CREATININE 0.66 (L) 06/30/2023   BILITOT 0.4 06/30/2023   ALKPHOS 107 06/30/2023   AST 15 06/30/2023   ALT 12 06/30/2023   PROT 7.1 06/30/2023   ALBUMIN 4.6 06/30/2023   CALCIUM  9.3 06/30/2023   ANIONGAP 14 07/27/2021   EGFR 111 06/30/2023   Lab Results  Component Value Date   CHOL 95 (L) 06/30/2023   Lab Results  Component Value Date   HDL 35 (L) 06/30/2023   Lab Results  Component Value Date   LDLCALC 45 06/30/2023   Lab Results  Component Value Date   TRIG 71 06/30/2023   Lab Results  Component Value Date   CHOLHDL 2.7 06/30/2023   Lab Results  Component Value Date   HGBA1C 6.2 (H) 06/30/2023      Assessment & Plan:  Diarrhea, unspecified type Assessment & Plan: 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 Informed consent was obtained for  all medications and treatment. The patient was involved in the decision-making  and treatment planning of their care  Referral placed to GI for follow up colonoscopy Stool studies order to evaluate for infectious or inflammatory cause Encouraged low fat and low residue diet   Orders: -     GI Profile, Stool, PCR -     Ambulatory referral to Gastroenterology -     Colesevelam  HCl; Take 3 tablets (1,875 mg total) by mouth 2 (two) times daily with a meal for 7 days.  Dispense: 42 tablet; Refill: 0  S/P partial colectomy -     Ambulatory referral to Gastroenterology -     Colesevelam  HCl; Take 3 tablets (1,875 mg total) by mouth 2 (two) times daily with a  meal for 7 days.  Dispense: 42 tablet; Refill: 0  IFG (impaired fasting glucose) -     Hemoglobin A1c  Vitamin D  deficiency -     VITAMIN D  25 Hydroxy (Vit-D Deficiency, Fractures)  TSH (thyroid -stimulating hormone deficiency) -     TSH + free T4  Other hyperlipidemia -     Lipid panel -     CMP14+EGFR -     CBC with Differential/Platelet  Note: This chart has been completed using Engineer, civil (consulting) software, and while attempts have been made to ensure accuracy, certain words and phrases may not be transcribed as intended.    Follow-up: Return in about 4 months (around 10/31/2023).   Merridy Pascoe, FNP

## 2023-06-30 NOTE — Assessment & Plan Note (Signed)
 Suspect bile acid diarrhea Will start a trial of Colesevelam (Bile acid sequestrants)  Alternatives, risks, benefits, and side effects of treatment were discussed. Informed consent was obtained for  all medications and treatment. The patient was involved in the decision-making  and treatment planning of their care  Referral placed to GI for follow up colonoscopy Stool studies order to evaluate for infectious or inflammatory cause Encouraged low fat and low residue diet

## 2023-06-30 NOTE — Patient Instructions (Addendum)
 I appreciate the opportunity to provide care to you today!    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 Timing with Other Medications: Separate the administration of colesevelam from other medications by at least 4 hours to prevent potential interactions. Side effects:  Constipation Nausea Runny or stuffy nose Sore throat Upset stomach provide a stool sample for further evaluation  Dietary adjustments: -Low-fat diet to reduce bile acid production -Keep a food/stool diary to monitor symptom triggers -Monitor for nutritional deficiencies (e.g., fat-soluble vitamins A, D, E, K, and B12)   Referrals today-  GI for colonscopy     Please continue to a heart-healthy diet and increase your physical activities. Try to exercise for at least five days a week.    It was a pleasure to see you and I look forward to continuing to work together on your health and well-being. Please do not hesitate to call the office if you need care or have questions about your care.  In case of emergency, please visit the Emergency Department for urgent care, or contact our clinic at [phone] to schedule an appointment. We're here to help you!   Have a wonderful day and week. With Gratitude, Tarvares Lant MSN, FNP-BC

## 2023-07-01 ENCOUNTER — Ambulatory Visit: Payer: Self-pay | Admitting: Family Medicine

## 2023-07-01 LAB — CMP14+EGFR
ALT: 12 IU/L (ref 0–44)
AST: 15 IU/L (ref 0–40)
Albumin: 4.6 g/dL (ref 3.8–4.9)
Alkaline Phosphatase: 107 IU/L (ref 44–121)
BUN/Creatinine Ratio: 21 — ABNORMAL HIGH (ref 9–20)
BUN: 14 mg/dL (ref 6–24)
Bilirubin Total: 0.4 mg/dL (ref 0.0–1.2)
CO2: 24 mmol/L (ref 20–29)
Calcium: 9.3 mg/dL (ref 8.7–10.2)
Chloride: 99 mmol/L (ref 96–106)
Creatinine, Ser: 0.66 mg/dL — ABNORMAL LOW (ref 0.76–1.27)
Globulin, Total: 2.5 g/dL (ref 1.5–4.5)
Glucose: 107 mg/dL — ABNORMAL HIGH (ref 70–99)
Potassium: 4.8 mmol/L (ref 3.5–5.2)
Sodium: 140 mmol/L (ref 134–144)
Total Protein: 7.1 g/dL (ref 6.0–8.5)
eGFR: 111 mL/min/{1.73_m2} (ref >59)

## 2023-07-01 LAB — CBC WITH DIFFERENTIAL/PLATELET
Basophils Absolute: 0 10*3/uL (ref 0.0–0.2)
Basos: 0 %
EOS (ABSOLUTE): 0.2 10*3/uL (ref 0.0–0.4)
Eos: 2 %
Hematocrit: 47.9 % (ref 37.5–51.0)
Hemoglobin: 16 g/dL (ref 13.0–17.7)
Immature Grans (Abs): 0 10*3/uL (ref 0.0–0.1)
Immature Granulocytes: 0 %
Lymphocytes Absolute: 2 10*3/uL (ref 0.7–3.1)
Lymphs: 22 %
MCH: 29.7 pg (ref 26.6–33.0)
MCHC: 33.4 g/dL (ref 31.5–35.7)
MCV: 89 fL (ref 79–97)
Monocytes Absolute: 0.6 10*3/uL (ref 0.1–0.9)
Monocytes: 7 %
Neutrophils Absolute: 6.3 10*3/uL (ref 1.4–7.0)
Neutrophils: 69 %
Platelets: 202 10*3/uL (ref 150–450)
RBC: 5.39 x10E6/uL (ref 4.14–5.80)
RDW: 13.4 % (ref 11.6–15.4)
WBC: 9.2 10*3/uL (ref 3.4–10.8)

## 2023-07-01 LAB — TSH+FREE T4
Free T4: 1.02 ng/dL (ref 0.82–1.77)
TSH: 3.77 u[IU]/mL (ref 0.450–4.500)

## 2023-07-01 LAB — LIPID PANEL
Chol/HDL Ratio: 2.7 ratio (ref 0.0–5.0)
Cholesterol, Total: 95 mg/dL — ABNORMAL LOW (ref 100–199)
HDL: 35 mg/dL — ABNORMAL LOW (ref >39)
LDL Chol Calc (NIH): 45 mg/dL (ref 0–99)
Triglycerides: 71 mg/dL (ref 0–149)
VLDL Cholesterol Cal: 15 mg/dL (ref 5–40)

## 2023-07-01 LAB — HEMOGLOBIN A1C
Est. average glucose Bld gHb Est-mCnc: 131 mg/dL
Hgb A1c MFr Bld: 6.2 % — ABNORMAL HIGH (ref 4.8–5.6)

## 2023-07-01 LAB — VITAMIN D 25 HYDROXY (VIT D DEFICIENCY, FRACTURES): Vit D, 25-Hydroxy: 48.5 ng/mL (ref 30.0–100.0)

## 2023-07-05 NOTE — Progress Notes (Signed)
 Office Visit Note  Patient: Derrick Turner             Date of Birth: 06-15-1970           MRN: [id number]             PCP: Zarwolo, Gloria, FNP Referring: Zarwolo, Gloria, FNP Visit Date: 07/17/2023 Occupation: @GUAROCC @  Subjective:  Joint stiffness  History of Present Illness: Derrick Turner is a 54 y.o. male returns today after his initial evaluation for polyarthralgia and positive ANA on Jun 09, 2023.  He continues to have pain and discomfort in his both shoulders for which he had rotator cuff tear repair.  He complains of discomfort in his bilateral hands, and both hips.  He is followed by Dr. Nat Badger for lower back pain and disc disease.  He denies history of oral ulcers, nasal ulcers, malar rash, photosensitivity, Raynaud's, lymphadenopathy or inflammatory arthritis.    Activities of Daily Living:  Patient reports morning stiffness for 30 minutes.   Patient Denies nocturnal pain.  Difficulty dressing/grooming: Denies Difficulty climbing stairs: Denies Difficulty getting out of chair: Denies Difficulty using hands for taps, buttons, cutlery, and/or writing: Reports  Review of Systems  Constitutional: Negative.  Negative for fatigue.  HENT:  Positive for mouth dryness. Negative for mouth sores.   Eyes: Negative.  Negative for dryness.  Respiratory: Negative.  Negative for shortness of breath.   Cardiovascular: Negative.  Negative for chest pain and palpitations.  Gastrointestinal:  Positive for diarrhea. Negative for blood in stool and constipation.  Endocrine: Negative.  Negative for increased urination.  Genitourinary: Negative.  Negative for involuntary urination.  Musculoskeletal:  Positive for joint pain, joint pain, joint swelling and morning stiffness. Negative for gait problem, myalgias, muscle weakness, muscle tenderness and myalgias.  Skin: Negative.  Negative for color change, rash, hair loss and sensitivity to sunlight.  Allergic/Immunologic: Negative.   Negative for susceptible to infections.  Neurological: Negative.  Negative for dizziness and headaches.  Hematological: Negative.  Negative for swollen glands.  Psychiatric/Behavioral: Negative.  Negative for depressed mood and sleep disturbance. The patient is not nervous/anxious.     PMFS History:  Patient Active Problem List   Diagnosis Date Noted   Diarrhea 06/30/2023   Elevated BP without diagnosis of hypertension 02/28/2023   Right hand pain 01/27/2023   Family history of prostate cancer in father 11/04/2022   Hematuria, gross 11/01/2022   Arthralgia 10/28/2022   Anxiety 06/10/2022   Generalized muscle ache 06/10/2022   Nephrolithiasis 04/05/2022   Lesion of left ear 11/05/2021   Tinnitus, bilateral 08/04/2021   Acute MI, 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HCC)    S/P partial colectomy 09/02/2020   Dysplastic colon polyp    Cigarette nicotine  dependence without complication 09/26/2017   Chronic right-sided low back pain with right-sided sciatica 09/26/2017   Mixed hyperlipidemia 09/26/2017   CAD (coronary artery disease) 05/03/2016   Tobacco abuse    Prediabetes    Gastroesophageal reflux disease    ACS (acute coronary syndrome) (HCC) 03/31/2016   Pelvic floor dysfunction 01/14/2016   Urinary hesitancy 12/21/2015    Past Medical History:  Diagnosis Date   Coronary artery disease cardiologist--- dr croitoru   ETT 04-01-2016 in epic, reproduced chest pain but no EKG changes;  cardiac cath 04-06-2016 moderate diffuse nonobstructive disease, aggressive medical management   High cholesterol    History of 2019 novel coronavirus disease (COVID-19) 03/02/2020   per pt tested positive covid, copy of result in epic, stated mild  symptoms that resolved   History of kidney stones    Incomplete right bundle branch block    Right ureteral calculus    Wears glasses     Family History  Problem Relation Age of Onset   Parkinson's disease Mother    Heart attack Father    Dementia Father    Heart  attack Maternal Grandmother    Heart attack Maternal Grandfather    Colon cancer Neg Hx    Colon polyps Neg Hx    Past Surgical History:  Procedure Laterality Date   APPENDECTOMY  [age]   BACK SURGERY     two   BIOPSY  08/11/2020   Procedure: BIOPSY;  Surgeon: Vinetta Greening, DO;  Location: [facility];  Service: Endoscopy;;   COLONOSCOPY WITH PROPOFOL  N/A 08/11/2020   Procedure: COLONOSCOPY WITH PROPOFOL ;  Surgeon: Vinetta Greening, DO;  Location: [facility];  Service: Endoscopy;  Laterality: N/A;  7:30am   CORONARY/GRAFT ACUTE MI REVASCULARIZATION N/A 07/16/2021   Procedure: Coronary/Graft Acute MI Revascularization;  Surgeon: Odie Benne, MD;  Location: [facility];  Service: Cardiovascular;  Laterality: N/A;   CYSTOSCOPY WITH RETROGRADE PYELOGRAM, URETEROSCOPY AND STENT PLACEMENT Right 04/14/2020   Procedure: CYSTOSCOPY WITH RETROGRADE PYELOGRAM, URETEROSCOPY AND STENT PLACEMENT;  Surgeon: Sherlyn Ditto, MD;  Location[facility];  Service: Urology;  Laterality: Right;   EXTRACORPOREAL SHOCK WAVE LITHOTRIPSY Right 03/23/2020   Procedure: EXTRACORPOREAL SHOCK WAVE LITHOTRIPSY (ESWL);  Surgeon: Samson Croak, MD;  Location: [facility];  Service: Urology;  Laterality: Right;   HOLMIUM LASER APPLICATION Right 04/14/2020   Procedure: HOLMIUM LASER APPLICATION;  Surgeon: Sherlyn Ditto, MD;  Location: [facility];  Service: Urology;  Laterality: Right;   LAPAROSCOPIC CHOLECYSTECTOMY  2012 approx   LEFT HEART CATH AND CORONARY ANGIOGRAPHY N/A 04/06/2016   Procedure: Left Heart Cath and Coronary Angiography;  Surgeon: Arnoldo Lapping, MD;  Location: [facility];  Service: Cardiovascular;  Laterality: N/A;   LEFT HEART CATH AND CORONARY ANGIOGRAPHY N/A 07/16/2021   Procedure: LEFT HEART CATH AND CORONARY ANGIOGRAPHY;  Surgeon: Odie Benne, MD;  Location: [facility];  Service: Cardiovascular;   Laterality: N/A;   ORCHIECTOMY Right 1990s   per pt benign cyst   PARTIAL COLECTOMY Right 09/02/2020   Procedure: HEMICOLECTOMY;  Surgeon: Alanda Allegra, MD;  Location: [facility];  Service: General;  Laterality: Right;   POLYPECTOMY  08/11/2020   Procedure: POLYPECTOMY;  Surgeon: Vinetta Greening, DO;  [facility]: [facility];  Service: Endoscopy;;   SHOULDER SURGERY Bilateral left 2019;  right 2016   SHOULDER SURGERY Left    SHOULDER SURGERY Right    Social History   Social History Narrative   Not on file   Immunization History  Administered Date(s) Administered   Influenza,inj,Quad PF,6+ Mos 11/07/2017   Moderna Sars-Covid-2 Vaccination 06/21/2019, 07/19/2019   Pneumococcal Polysaccharide-23 11/07/2017   Td (Adult),5 Lf Tetanus Toxid, Preservative Free 06/20/1998   Tdap 08/04/2021   Zoster Recombinant(Shingrix ) 08/04/2021, 11/05/2021     Objective: Vital Signs: BP 134/87   Pulse 92   Resp 16   Ht 5' 9.5" (1.765 m)   Wt 226 lb 12.8 oz (102.9 kg)   SpO2 99%   BMI 33.01 kg/m    Physical Exam Vitals and nursing note reviewed.  Constitutional:      Appearance: He is well-developed.  HENT:     Head: Normocephalic and atraumatic.  Eyes:     Conjunctiva/sclera: Conjunctivae normal.     Pupils: Pupils are equal, round, and reactive to light.  Cardiovascular:     Rate and Rhythm: Normal rate and regular rhythm.     Heart sounds: Normal heart sounds.  Pulmonary:     Effort: Pulmonary effort is normal.     Breath sounds: Normal breath sounds.  Abdominal:     General: Bowel sounds are normal.     Palpations: Abdomen is soft.  Musculoskeletal:     Cervical back: Normal range of motion and neck supple.  Skin:    General: Skin is warm and dry.     Capillary Refill: Capillary refill takes less than 2 seconds.  Neurological:     Mental Status: He is alert and oriented to person, place, and time.  Psychiatric:        Behavior: Behavior normal.      Musculoskeletal Exam:  Good range of motion of the cervical spine.  He had limited range of motion of the lumbar spine with some discomfort.  Shoulder joint abduction was limited to about 160 degrees with some discomfort.  He had some limitation with internal rotation.  Elbows, wrists were in good range of motion.  He had limited extension of his PIP and DIP joints and thickening of PIP and DIP joints.  No synovitis was noted.  He had some discomfort range of motion of his hip joints.  Knee joints with good range of motion without any warmth swelling or effusion.  There was no tenderness over ankles or MTPs.  CDAI Exam: CDAI Score: -- Patient Global: --; Provider Global: -- Swollen: --; Tender: -- Joint Exam 07/17/2023   No joint exam has been documented for this visit   There is currently no information documented on the homunculus. Go to the Rheumatology activity and complete the homunculus joint exam.  Investigation: No additional findings.  Imaging: No results found.  Recent Labs: Lab Results  Component Value Date   WBC 9.2 06/30/2023   HGB 16.0 06/30/2023   PLT 202 06/30/2023   NA 140 06/30/2023   K 4.8 06/30/2023   CL 99 06/30/2023   CO2 24 06/30/2023   GLUCOSE 107 (H) 06/30/2023   BUN 14 06/30/2023   CREATININE 0.66 (L) 06/30/2023   BILITOT 0.4 06/30/2023   ALKPHOS 107 06/30/2023   AST 15 06/30/2023   ALT 12 06/30/2023   PROT 7.1 06/30/2023   ALBUMIN 4.6 06/30/2023   CALCIUM  9.3 06/30/2023   GFRAA 122 07/26/2019   Jun 09, 2023 ANA negative, RNP 4.0, (Smith, SSA, SSB, dsDNA, SCL 70 negative), C3-C4 normal, anti-CCP negative Jun 30, 2023 hemoglobin A1c 6.2, vitamin D  48.5  Speciality Comments: If labs are normal, Cancel new patient follow up  Procedures:  No procedures performed Allergies: Patient has no known allergies.   Assessment / Plan:     Visit Diagnoses: Positive ANA (antinuclear antibody) - Repeat ANA negative, RNP positive. -We had a detailed discussion regarding significance  of positive RNP and its association with mixed connective tissue disease.  Patient has no clinical features of mixed connective tissue disease at this point.  I advised him to contact me if he develops any new symptoms.  I will repeat labs in 1 year.  Plan: CBC with Differential/Platelet, Comprehensive metabolic panel with GFR, ANA, Anti-DNA antibody, double-stranded, C3 and C4, Sedimentation rate, RNP Antibody  Polyarthralgia - History of a stiffness in the cervical spine, lower back, both shoulders, right wrist, both  hands and both hips.  No synovitis was noted on the examination.  Chronic pain of both shoulders - status post bilateral rotator cuff tear repair.  He follows with orthopedic surgery.  Primary osteoarthritis of both hands -he had limited extension of PIP and DIP joints.  He had bilateral PIP and DIP thickening.  Clinical and radiographic findings were suggestive of osteoarthritis.  Status post left index trigger finger release November 2024.  Chronic pain of both hips-he continues to have some stiffness and discomfort in his hip joints.  Lumbar spondylosis - Discectomy x 2.  He continues to have chronic pain.  Followed by Dr. Nat Badger.  Vitamin D  deficiency - Vitamin D  23.3 on February 28, 2023.  He is on vitamin D  50,000 units/week.  Other medical problems are listed as follows:  Coronary artery disease involving native coronary artery of native heart without angina pectoris  Prediabetes  Dysplastic colon polyp  Mixed hyperlipidemia  History of MI (myocardial infarction)  History of gastroesophageal reflux (GERD)  S/P partial colectomy  Nephrolithiasis  Onychomycosis  Cigarette nicotine  dependence without complication  Family history of prostate cancer in father  Orders: Orders Placed This Encounter  Procedures   CBC with Differential/Platelet   Comprehensive metabolic panel with GFR   ANA   Anti-DNA antibody, double-stranded   C3 and C4   Sedimentation  rate   RNP Antibody   No orders of the defined types were placed in this encounter.    Follow-Up Instructions: Return in about 1 year (around 07/16/2024) for Positive ANA, positive RNP.   Nicholas Bari, MD  Note - This record has been created using Animal nutritionist.  Chart creation errors have been sought, but may not always  have been located. Such creation errors do not reflect on  the standard of medical care.

## 2023-07-08 ENCOUNTER — Ambulatory Visit (HOSPITAL_COMMUNITY)
Admission: RE | Admit: 2023-07-08 | Discharge: 2023-07-08 | Disposition: A | Discharge status: Home / Self Care | Source: Ambulatory Visit | Attending: Orthopedic Surgery | Admitting: Orthopedic Surgery

## 2023-07-08 DIAGNOSIS — M25511 Pain in right shoulder: Secondary | ICD-10-CM | POA: Insufficient documentation

## 2023-07-08 DIAGNOSIS — G8929 Other chronic pain: Secondary | ICD-10-CM | POA: Insufficient documentation

## 2023-07-08 DIAGNOSIS — M25512 Pain in left shoulder: Secondary | ICD-10-CM | POA: Diagnosis present

## 2023-07-14 ENCOUNTER — Telehealth: Payer: Self-pay | Admitting: Radiology

## 2023-07-14 NOTE — Telephone Encounter (Signed)
 Patient would like an appt with Dr Ernesta Heading, called [facility] office, can you please call and schedule? Thanks.

## 2023-07-17 ENCOUNTER — Ambulatory Visit: Attending: Rheumatology | Admitting: Rheumatology

## 2023-07-17 ENCOUNTER — Ambulatory Visit: Admitting: Gastroenterology

## 2023-07-17 ENCOUNTER — Encounter: Payer: Self-pay | Admitting: Rheumatology

## 2023-07-17 VITALS — BP 134/87 | HR 92 | Resp 16 | Ht 69.5 in | Wt 226.8 lb

## 2023-07-17 DIAGNOSIS — Z9049 Acquired absence of other specified parts of digestive tract: Secondary | ICD-10-CM

## 2023-07-17 DIAGNOSIS — N200 Calculus of kidney: Secondary | ICD-10-CM

## 2023-07-17 DIAGNOSIS — K635 Polyp of colon: Secondary | ICD-10-CM

## 2023-07-17 DIAGNOSIS — R768 Other specified abnormal immunological findings in serum: Secondary | ICD-10-CM | POA: Diagnosis not present

## 2023-07-17 DIAGNOSIS — F17210 Nicotine dependence, cigarettes, uncomplicated: Secondary | ICD-10-CM

## 2023-07-17 DIAGNOSIS — M25551 Pain in right hip: Secondary | ICD-10-CM

## 2023-07-17 DIAGNOSIS — M19042 Primary osteoarthritis, left hand: Secondary | ICD-10-CM

## 2023-07-17 DIAGNOSIS — M47816 Spondylosis without myelopathy or radiculopathy, lumbar region: Secondary | ICD-10-CM

## 2023-07-17 DIAGNOSIS — Z8719 Personal history of other diseases of the digestive system: Secondary | ICD-10-CM

## 2023-07-17 DIAGNOSIS — E559 Vitamin D deficiency, unspecified: Secondary | ICD-10-CM

## 2023-07-17 DIAGNOSIS — Z8042 Family history of malignant neoplasm of prostate: Secondary | ICD-10-CM

## 2023-07-17 DIAGNOSIS — B351 Tinea unguium: Secondary | ICD-10-CM

## 2023-07-17 DIAGNOSIS — G8929 Other chronic pain: Secondary | ICD-10-CM

## 2023-07-17 DIAGNOSIS — I2510 Atherosclerotic heart disease of native coronary artery without angina pectoris: Secondary | ICD-10-CM

## 2023-07-17 DIAGNOSIS — M25552 Pain in left hip: Secondary | ICD-10-CM

## 2023-07-17 DIAGNOSIS — M25512 Pain in left shoulder: Secondary | ICD-10-CM

## 2023-07-17 DIAGNOSIS — M25511 Pain in right shoulder: Secondary | ICD-10-CM | POA: Diagnosis not present

## 2023-07-17 DIAGNOSIS — I252 Old myocardial infarction: Secondary | ICD-10-CM

## 2023-07-17 DIAGNOSIS — M2550 Pain in unspecified joint: Secondary | ICD-10-CM | POA: Diagnosis not present

## 2023-07-17 DIAGNOSIS — R7303 Prediabetes: Secondary | ICD-10-CM

## 2023-07-17 DIAGNOSIS — M19041 Primary osteoarthritis, right hand: Secondary | ICD-10-CM | POA: Diagnosis not present

## 2023-07-17 DIAGNOSIS — E782 Mixed hyperlipidemia: Secondary | ICD-10-CM

## 2023-07-17 NOTE — Patient Instructions (Addendum)
 Smoking Cessation: QuitlineNC 1-800-QUIT-NOW 7016331877); Espaol: 1-855-Djelo-Ya (9-811-914-7829) http://carroll-castaneda.info/    Hand Exercises Hand exercises can be helpful for almost anyone. They can strengthen your hands and improve flexibility and movement. The exercises can also increase blood flow to the hands. These results can make your work and daily tasks easier for you. Hand exercises can be especially helpful for people who have joint pain from arthritis or nerve damage from using their hands over and over. These exercises can also help people who injure a hand. Exercises Most of these hand exercises are gentle stretching and motion exercises. It is usually safe to do them often throughout the day. Warming up your hands before exercise may help reduce stiffness. You can do this with gentle massage or by placing your hands in warm water  for 10-15 minutes. It is normal to feel some stretching, pulling, tightness, or mild discomfort when you begin new exercises. In time, this will improve. Remember to always be careful and stop right away if you feel sudden, very bad pain or your pain gets worse. You want to get better and be safe. Ask your health care provider which exercises are safe for you. Do exercises exactly as told by your provider and adjust them as told. Do not begin these exercises until told by your provider. Knuckle bend or "claw" fist  Stand or sit with your arm, hand, and all five fingers pointed straight up. Make sure to keep your wrist straight. Gently bend your fingers down toward your palm until the tips of your fingers are touching your palm. Keep your big knuckle straight and only bend the small knuckles in your fingers. Hold this position for 10 seconds. Straighten your fingers back to your starting position. Repeat this exercise 5-10 times with each hand. Full finger fist  Stand or sit with your arm, hand, and all five fingers pointed straight up.  Make sure to keep your wrist straight. Gently bend your fingers into your palm until the tips of your fingers are touching the middle of your palm. Hold this position for 10 seconds. Extend your fingers back to your starting position, stretching every joint fully. Repeat this exercise 5-10 times with each hand. Straight fist  Stand or sit with your arm, hand, and all five fingers pointed straight up. Make sure to keep your wrist straight. Gently bend your fingers at the big knuckle, where your fingers meet your hand, and at the middle knuckle. Keep the knuckle at the tips of your fingers straight and try to touch the bottom of your palm. Hold this position for 10 seconds. Extend your fingers back to your starting position, stretching every joint fully. Repeat this exercise 5-10 times with each hand. Tabletop  Stand or sit with your arm, hand, and all five fingers pointed straight up. Make sure to keep your wrist straight. Gently bend your fingers at the big knuckle, where your fingers meet your hand, as far down as you can. Keep the small knuckles in your fingers straight. Think of forming a tabletop with your fingers. Hold this position for 10 seconds. Extend your fingers back to your starting position, stretching every joint fully. Repeat this exercise 5-10 times with each hand. Finger spread  Place your hand flat on a table with your palm facing down. Make sure your wrist stays straight. Spread your fingers and thumb apart from each other as far as you can until you feel a gentle stretch. Hold this position for 10 seconds. Bring your fingers  and thumb tight together again. Hold this position for 10 seconds. Repeat this exercise 5-10 times with each hand. Making circles  Stand or sit with your arm, hand, and all five fingers pointed straight up. Make sure to keep your wrist straight. Make a circle by touching the tip of your thumb to the tip of your index finger. Hold for 10 seconds.  Then open your hand wide. Repeat this motion with your thumb and each of your fingers. Repeat this exercise 5-10 times with each hand. Thumb motion  Sit with your forearm resting on a table and your wrist straight. Your thumb should be facing up toward the ceiling. Keep your fingers relaxed as you move your thumb. Lift your thumb up as high as you can toward the ceiling. Hold for 10 seconds. Bend your thumb across your palm as far as you can, reaching the tip of your thumb for the small finger (pinkie) side of your palm. Hold for 10 seconds. Repeat this exercise 5-10 times with each hand. Grip strengthening  Hold a stress ball or other soft ball in the middle of your hand. Slowly increase the pressure, squeezing the ball as much as you can without causing pain. Think of bringing the tips of your fingers into the middle of your palm. All of your finger joints should bend when doing this exercise. Hold your squeeze for 10 seconds, then relax. Repeat this exercise 5-10 times with each hand. Contact a health care provider if: Your hand pain or discomfort gets much worse when you do an exercise. Your hand pain or discomfort does not improve within 2 hours after you exercise. If you have either of these problems, stop doing these exercises right away. Do not do them again unless your provider says that you can. Get help right away if: You develop sudden, severe hand pain or swelling. If this happens, stop doing these exercises right away. Do not do them again unless your provider says that you can. This information is not intended to replace advice given to you by your health care provider. Make sure you discuss any questions you have with your health care provider. Document Revised: 02/08/2022 Document Reviewed: 02/08/2022 Elsevier Patient Education  2024 [employer].

## 2023-07-25 ENCOUNTER — Encounter: Payer: Self-pay | Admitting: Internal Medicine

## 2023-07-25 ENCOUNTER — Ambulatory Visit: Admitting: Gastroenterology

## 2023-08-01 ENCOUNTER — Encounter: Payer: Self-pay | Admitting: Orthopedic Surgery

## 2023-08-01 ENCOUNTER — Ambulatory Visit: Admitting: Orthopedic Surgery

## 2023-08-01 DIAGNOSIS — M19011 Primary osteoarthritis, right shoulder: Secondary | ICD-10-CM

## 2023-08-01 NOTE — Patient Instructions (Signed)

## 2023-08-01 NOTE — Progress Notes (Signed)
 Return patient Visit  Assessment: Derrick Turner is a 53 y.o. male with the following: 1. Chronic pain of both shoulders; history of bilateral shoulder surgery.  Plan: Derrick Turner is having pain in both shoulders.  Right is worse than left.  MRI demonstrates advanced degenerative changes of the [facility] joint on the right.  We have discussed this as a potential cause of his pain.  We discussed proceeding with an injection, and this was completed in clinic today.  I would like to see him in a month to see how much improvement he had following the injection.  Procedure note injection - Right shoulder - AC joint   Verbal consent was obtained to inject the right shoulder, AC joint Timeout was completed to confirm the site of injection.   The skin was prepped with alcohol and ethyl chloride was sprayed at the injection site.  A 21-gauge needle was used to inject 40 mg of Depo-Medrol  and 1% lidocaine  (4 cc) into the [facility] joint of the right shoulder using a superior approach.  There were no complications.  A sterile bandage was applied.     Follow-up: Return in about 4 weeks (around 08/29/2023).  Subjective:  Chief Complaint  Patient presents with   Results    MRI review of shoulders. Right shoulder is still feeling pain    History of Present Illness: Derrick Turner is a 53 y.o. male who returns for evaluation of bilateral shoulder pain.  Right continues to be more painful than the left.  He has some pain with cross body motions.  He has some tenderness to the superior aspect of the shoulder.  He is primarily complaining of pain in the anterior aspect of bilateral shoulders.  He has had subacromial injections in both shoulders, which improved his symptoms for about a a week.  He has been under the care of a rheumatologist.  According to him, he has been cleared.  No concern for rheumatoid or other autoimmune diseases.    Review of Systems: No fevers or chills No numbness or  tingling No chest pain No shortness of breath No bowel or bladder dysfunction No GI distress No headaches    Objective: There were no vitals taken for this visit.  Physical Exam:  General: Alert and oriented. and No acute distress. Gait: Normal gait.  Bilateral shoulders with well healed anterior based surgical incisions.  No surrounding erythema or drainage.  160 FF.  IR to Lumbar spine.  Positive Jobe's.  Tenderness to palpation over the [facility] joint on the right.  Pain with cross body adduction.  Mild weakness is appreciated in the supraspinatus bilaterally.   IMAGING: I personally ordered and reviewed the following images  MRI right shoulder  IMPRESSION: 1. Moderate supraspinatus tendon with fraying along the anterior bursal surface and a small low-grade interstitial tear. 2. Moderate infraspinatus and subscapularis tendinosis. 3. Moderate arthropathy of the acromioclavicular joint with subchondral marrow edema and cystic changes.    MRI left shoulder  IMPRESSION: 1. Moderate supraspinatus tendinosis with a partial-thickness articular surface tear measuring 20 mm AP and 22 mm medial collateral encompassing 60% of the tendon thickness. 2. Mild infraspinatus and subscapularis tendinosis.   New Medications:  No orders of the defined types were placed in this encounter.     [name], MD  08/01/2023 9:24 AM

## 2023-08-29 ENCOUNTER — Ambulatory Visit: Admitting: Orthopedic Surgery

## 2023-08-29 ENCOUNTER — Encounter: Payer: Self-pay | Admitting: Orthopedic Surgery

## 2023-08-29 DIAGNOSIS — M19011 Primary osteoarthritis, right shoulder: Secondary | ICD-10-CM | POA: Diagnosis not present

## 2023-08-29 NOTE — Progress Notes (Signed)
 Return patient Visit  Assessment: Derrick Turner is a 53 y.o. male with the following: 1. Chronic pain of both shoulders; history of bilateral shoulder surgery.  Plan: Derrick Turner continues to have pain in both shoulders.  Right is bothering him more than the left.  At the last visit, we injected the [facility] joint.  This provided limited improvement in his symptoms.  He localizes his pain to the [facility] joint.  I do think would benefit from shoulder arthroscopy and distal clavicle excision.  This was discussed with him in clinic today.  Will reach out to his PCP, as well as his cardiologist to confirm clearance.  Will plan to proceed with surgery on September 18, 2023.  If unable to confirm clearance, we may have to delay surgery.  He states his understanding.  I will see him in clinic after surgery.  Risks and benefits of the surgery, including, but not limited to infection, bleeding, persistent pain, need for further surgery and more severe complications associated with anesthesia were discussed with the patient.  The patient has elected to proceed.     Follow-up: Return for After surgery; DOS 09/18/2023.  Subjective:  Chief Complaint  Patient presents with   Shoulder Pain    Bilat, last injections didn't provide any relief but pain didn't get worse.     History of Present Illness: Derrick Turner is a 53 y.o. male who returns for evaluation of bilateral shoulder pain.  Right continues to be more painful than the left.  Last visit, I injected his AC joint on the right.  This provided limited improvement in his symptoms.  He states it is not gotten worse.  However, he notes that he did not have improvement in his symptoms.  He continues to have difficulty with activities.  The left is not currently bothering him.  He is interested in surgery, if I think that it could benefit him.  Review of Systems: No fevers or chills No numbness or tingling No chest pain No shortness of breath No  bowel or bladder dysfunction No GI distress No headaches    Objective: There were no vitals taken for this visit.  Physical Exam:  General: Alert and oriented. and No acute distress. Gait: Normal gait.  Bilateral shoulders with well healed anterior based surgical incisions.  No surrounding erythema or drainage.  160 FF.  IR to Lumbar spine.  Positive Jobe's.  Tenderness to palpation over the [facility] joint on the right.  Pain with cross body adduction.  Mild weakness is appreciated in the supraspinatus bilaterally.   IMAGING: I personally ordered and reviewed the following images  MRI right shoulder  IMPRESSION: 1. Moderate supraspinatus tendon with fraying along the anterior bursal surface and a small low-grade interstitial tear. 2. Moderate infraspinatus and subscapularis tendinosis. 3. Moderate arthropathy of the acromioclavicular joint with subchondral marrow edema and cystic changes.    MRI left shoulder  IMPRESSION: 1. Moderate supraspinatus tendinosis with a partial-thickness articular surface tear measuring 20 mm AP and 22 mm medial collateral encompassing 60% of the tendon thickness. 2. Mild infraspinatus and subscapularis tendinosis.   New Medications:  No orders of the defined types were placed in this encounter.     Derrick DELENA Horde, MD  08/29/2023 9:20 AM

## 2023-08-29 NOTE — Patient Instructions (Signed)
 Preoperative Instructions  Your surgery will be at [facility], scheduled with Dr Oneil Horde   The hospital will contact you with a preoperative appointment to discuss Anesthesia. The phone number is [phone]   Please bring your medications with you for the appointment.  They will tell you the arrival time and medication instructions when you have your preoperative evaluation.  Do not wear nail polish the day of your surgery and if you take Phentermine you need to stop this medication ONE WEEK prior to your surgery.    If you take an blood thinning medication, we will need to stop this prior to surgery.  Typically, we stop this medicine at least 5 days prior to surgery.  We will need to confirm this with the doctor who prescribes this medication.  If you are taking medications or an injection for diabetes, or for weight management, this medicine will need to be stopped at least 7 days prior to surgery.     Surgery will be scheduled for 09/18/23 pending authorization by your insurance company.   Pain medicine policy:  Per [facility] clinic policy, our goal is ensure optimal postoperative pain control with a multimodal pain management strategy.   For all [facility] patients, our goal is to wean post-operative narcotic medications by 6 weeks post-operatively.   If this is not possible due to utilization of pain medication prior to surgery, your [facility] doctor will support your acute post-operative pain control for the first 6 weeks postoperatively, with a plan to transition you back to your primary pain team following that.   [facility] will work to ensure a Therapist, occupational.

## 2023-09-01 ENCOUNTER — Other Ambulatory Visit: Payer: Self-pay

## 2023-09-01 DIAGNOSIS — M19011 Primary osteoarthritis, right shoulder: Secondary | ICD-10-CM

## 2023-09-14 ENCOUNTER — Other Ambulatory Visit: Payer: Self-pay

## 2023-09-14 ENCOUNTER — Encounter (HOSPITAL_COMMUNITY)
Admission: RE | Admit: 2023-09-14 | Discharge: 2023-09-14 | Disposition: A | Discharge status: Home / Self Care | Source: Ambulatory Visit | Attending: Orthopedic Surgery | Admitting: Orthopedic Surgery

## 2023-09-14 ENCOUNTER — Encounter (HOSPITAL_COMMUNITY): Payer: Self-pay

## 2023-09-14 HISTORY — DX: Unspecified osteoarthritis, unspecified site: M19.90

## 2023-09-14 HISTORY — DX: Acute myocardial infarction, unspecified: I21.9

## 2023-10-11 ENCOUNTER — Encounter (HOSPITAL_COMMUNITY)
Admission: RE | Admit: 2023-10-11 | Discharge: 2023-10-11 | Disposition: A | Discharge status: Home / Self Care | Source: Ambulatory Visit | Attending: Orthopedic Surgery | Admitting: Orthopedic Surgery

## 2023-10-11 ENCOUNTER — Other Ambulatory Visit: Payer: Self-pay

## 2023-10-11 ENCOUNTER — Encounter (HOSPITAL_COMMUNITY): Payer: Self-pay

## 2023-10-11 NOTE — H&P (Signed)
 Surgical History & Physical  Patient Name: Derrick Turner  DOB: 10/29/1970  Surgery: Cataract extraction with intraocular lens implant phacoemulsification; Left Eye Surgeon: Marsa Cleverly MD Surgery Date: 10/20/2023 Pre-Op Date: 09/06/2023  HPI: A 64 Yr. old male patient present for a cataract eval, self referral. 1. The patient states he was told a few years ago he had a cataract starting OS. Vision has progressively worsened over the past year OS. Difficulties with all activities, depth perception is off. Unable to go through a drive thru and judge the cup being handed to him. Driving is becoming harder. The episode is constant. The condition's severity is worsening.  Medical History: Cataracts  Arthritis Heart Problem LDL  Review of Systems Cardiovascular CAD, hx heart attack 2023 Musculoskeletal arthritis pains All recorded systems are negative except as noted above.  Social Current every day smoker   Medication Prednisolone-moxiflox-bromfen,  Atorvastatin , Ergocalciferol  (vitamin D2), Citalopram   Sx/Procedures Partial colectomy, Shoulder surgery bilateral, Back Surgery, Stents due to heart attack, Hand surgery  Drug Allergies  NKDA  ROS:  History & Physical: Heent: cataract NECK: supple without bruits LUNGS: lungs clear to auscultation CV: regular rate and rhythm Abdomen: soft and non-tender  Impression & Plan: Assessment: 1.  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  CONJUNCTIVOCHALASIS; Both Eyes (H11.823) 3.  BLEPHARITIS; Right Upper Lid, Right Lower Lid, Left Upper Lid, Left Lower Lid (H01.001, H01.002,H01.004,H01.005) 4.  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  Cataracts are visually significant and account for the patient's complaints. Discussed all risks, benefits, procedures and recovery, including infection, loss of vision and eye, need for glasses after surgery or additional procedures. Patient understands changing glasses will  not improve vision. Patient indicated understanding of procedure. All questions answered. Patient desires to have surgery, recommend phacoemulsification with intraocular lens. Patient to have preliminary testing necessary ([employer]os/IOL Master, Mac OCT, TOPO) Educational materials provided:Cataract.  Plan: - Proceed with cataract surgery OS, hold on OD for now - Plan for best distance target with DIB00 - No DM, no fuchs, no prior eye surgery - good dilation - Dextenza  if available  2.  Artificial tears PRN  3.  Blepharitis with telangiectasias and blocked glands. Will use warm compresses once daily long term. Lid scrubs one to two times weekly.  4.  Few scattered drusen, monitor for now

## 2023-10-16 ENCOUNTER — Ambulatory Visit (HOSPITAL_COMMUNITY): Payer: Self-pay | Admitting: Certified Registered"

## 2023-10-16 ENCOUNTER — Ambulatory Visit (HOSPITAL_COMMUNITY)
Admission: RE | Admit: 2023-10-16 | Discharge: 2023-10-16 | Disposition: A | Discharge status: Home / Self Care | Attending: Orthopedic Surgery | Admitting: Orthopedic Surgery

## 2023-10-16 ENCOUNTER — Encounter (HOSPITAL_COMMUNITY)
Admission: RE | Disposition: A | Discharge status: Home / Self Care | Payer: Self-pay | Source: Home / Self Care | Attending: Orthopedic Surgery

## 2023-10-16 ENCOUNTER — Other Ambulatory Visit: Payer: Self-pay

## 2023-10-16 ENCOUNTER — Encounter (HOSPITAL_COMMUNITY): Payer: Self-pay | Admitting: Orthopedic Surgery

## 2023-10-16 ENCOUNTER — Encounter: Payer: Self-pay | Admitting: Orthopedic Surgery

## 2023-10-16 DIAGNOSIS — M67819 Other specified disorders of synovium and tendon, unspecified shoulder: Secondary | ICD-10-CM | POA: Diagnosis not present

## 2023-10-16 DIAGNOSIS — I2510 Atherosclerotic heart disease of native coronary artery without angina pectoris: Secondary | ICD-10-CM | POA: Diagnosis not present

## 2023-10-16 DIAGNOSIS — F419 Anxiety disorder, unspecified: Secondary | ICD-10-CM | POA: Diagnosis not present

## 2023-10-16 DIAGNOSIS — Z9889 Other specified postprocedural states: Secondary | ICD-10-CM | POA: Diagnosis not present

## 2023-10-16 DIAGNOSIS — S46011A Strain of muscle(s) and tendon(s) of the rotator cuff of right shoulder, initial encounter: Secondary | ICD-10-CM | POA: Diagnosis not present

## 2023-10-16 DIAGNOSIS — I252 Old myocardial infarction: Secondary | ICD-10-CM | POA: Diagnosis not present

## 2023-10-16 DIAGNOSIS — F17210 Nicotine dependence, cigarettes, uncomplicated: Secondary | ICD-10-CM

## 2023-10-16 DIAGNOSIS — K219 Gastro-esophageal reflux disease without esophagitis: Secondary | ICD-10-CM | POA: Diagnosis not present

## 2023-10-16 DIAGNOSIS — M75111 Incomplete rotator cuff tear or rupture of right shoulder, not specified as traumatic: Secondary | ICD-10-CM | POA: Diagnosis not present

## 2023-10-16 DIAGNOSIS — M19011 Primary osteoarthritis, right shoulder: Secondary | ICD-10-CM

## 2023-10-16 DIAGNOSIS — G709 Myoneural disorder, unspecified: Secondary | ICD-10-CM | POA: Diagnosis not present

## 2023-10-16 HISTORY — PX: SHOULDER ARTHROSCOPY: SHX128

## 2023-10-16 HISTORY — PX: OPEN SUBSCAPULARIS REPAIR: SHX6233

## 2023-10-16 SURGERY — ARTHROSCOPY, SHOULDER
Anesthesia: Regional | Site: Shoulder | Laterality: Right

## 2023-10-16 MED ORDER — PROPOFOL 10 MG/ML IV BOLUS
INTRAVENOUS | Status: AC
  Filled 2023-10-16: qty 20

## 2023-10-16 MED ORDER — SUCCINYLCHOLINE CHLORIDE 200 MG/10ML IV SOSY
PREFILLED_SYRINGE | INTRAVENOUS | Status: AC
  Filled 2023-10-16: qty 10

## 2023-10-16 MED ORDER — SODIUM CHLORIDE 0.9 % IR SOLN
Status: DC | PRN
  Administered 2023-10-16: 1000 mL

## 2023-10-16 MED ORDER — OXYCODONE HCL 5 MG PO TABS
5.0000 mg | ORAL_TABLET | Freq: Once | ORAL | Status: AC | PRN
  Administered 2023-10-16: 5 mg via ORAL
  Filled 2023-10-16: qty 1

## 2023-10-16 MED ORDER — BUPIVACAINE-EPINEPHRINE 0.5% -1:200000 IJ SOLN
INTRAMUSCULAR | Status: DC | PRN
  Administered 2023-10-16: 10 mL

## 2023-10-16 MED ORDER — DEXAMETHASONE SODIUM PHOSPHATE 10 MG/ML IJ SOLN
INTRAMUSCULAR | Status: DC | PRN
  Administered 2023-10-16: 4 mg via INTRAVENOUS

## 2023-10-16 MED ORDER — HYDROMORPHONE HCL 1 MG/ML IJ SOLN
INTRAMUSCULAR | Status: AC
  Filled 2023-10-16: qty 0.5

## 2023-10-16 MED ORDER — CEFAZOLIN SODIUM-DEXTROSE 2-4 GM/100ML-% IV SOLN
2.0000 g | INTRAVENOUS | Status: AC
  Administered 2023-10-16: 2 g via INTRAVENOUS
  Filled 2023-10-16: qty 100

## 2023-10-16 MED ORDER — ACETAMINOPHEN 160 MG/5ML PO SOLN
960.0000 mg | Freq: Once | ORAL | Status: AC
  Filled 2023-10-16: qty 30

## 2023-10-16 MED ORDER — MIDAZOLAM HCL 2 MG/2ML IJ SOLN
INTRAMUSCULAR | Status: DC | PRN
  Administered 2023-10-16: 2 mg via INTRAVENOUS

## 2023-10-16 MED ORDER — FENTANYL CITRATE (PF) 100 MCG/2ML IJ SOLN
INTRAMUSCULAR | Status: DC | PRN
  Administered 2023-10-16 (×4): 50 ug via INTRAVENOUS

## 2023-10-16 MED ORDER — ROCURONIUM BROMIDE 10 MG/ML (PF) SYRINGE
PREFILLED_SYRINGE | INTRAVENOUS | Status: AC
  Filled 2023-10-16: qty 10

## 2023-10-16 MED ORDER — DEXMEDETOMIDINE HCL IN NACL 80 MCG/20ML IV SOLN
INTRAVENOUS | Status: AC
  Filled 2023-10-16: qty 20

## 2023-10-16 MED ORDER — DEXMEDETOMIDINE HCL IN NACL 80 MCG/20ML IV SOLN
INTRAVENOUS | Status: DC | PRN
  Administered 2023-10-16: 20 ug via INTRAVENOUS

## 2023-10-16 MED ORDER — OXYCODONE HCL 5 MG/5ML PO SOLN: 5.0000 mg | Freq: Once | ORAL | Status: AC | PRN

## 2023-10-16 MED ORDER — ACETAMINOPHEN 500 MG PO TABS
1000.0000 mg | ORAL_TABLET | Freq: Once | ORAL | Status: AC
  Administered 2023-10-16: 1000 mg via ORAL
  Filled 2023-10-16: qty 2

## 2023-10-16 MED ORDER — MIDAZOLAM HCL 2 MG/2ML IJ SOLN
INTRAMUSCULAR | Status: AC
  Filled 2023-10-16: qty 2

## 2023-10-16 MED ORDER — FENTANYL CITRATE (PF) 100 MCG/2ML IJ SOLN
INTRAMUSCULAR | Status: AC
  Filled 2023-10-16: qty 2

## 2023-10-16 MED ORDER — LABETALOL HCL 5 MG/ML IV SOLN
INTRAVENOUS | Status: AC
Start: 2023-10-16 — End: 2023-10-16
  Filled 2023-10-16: qty 4

## 2023-10-16 MED ORDER — ROCURONIUM BROMIDE 10 MG/ML (PF) SYRINGE
PREFILLED_SYRINGE | INTRAVENOUS | Status: DC | PRN
  Administered 2023-10-16: 60 mg via INTRAVENOUS
  Administered 2023-10-16 (×2): 10 mg via INTRAVENOUS

## 2023-10-16 MED ORDER — BUPIVACAINE-EPINEPHRINE (PF) 0.5% -1:200000 IJ SOLN
INTRAMUSCULAR | Status: AC
  Filled 2023-10-16: qty 30

## 2023-10-16 MED ORDER — LIDOCAINE HCL (PF) 1 % IJ SOLN
INTRAMUSCULAR | Status: AC
  Filled 2023-10-16: qty 30

## 2023-10-16 MED ORDER — ONDANSETRON HCL 4 MG/2ML IJ SOLN
INTRAMUSCULAR | Status: AC
  Filled 2023-10-16: qty 2

## 2023-10-16 MED ORDER — HYDROMORPHONE HCL 1 MG/ML IJ SOLN
INTRAMUSCULAR | Status: DC | PRN
  Administered 2023-10-16: .5 mg via INTRAVENOUS

## 2023-10-16 MED ORDER — SUGAMMADEX SODIUM 200 MG/2ML IV SOLN
INTRAVENOUS | Status: DC | PRN
  Administered 2023-10-16: 200 mg via INTRAVENOUS

## 2023-10-16 MED ORDER — ROPIVACAINE HCL 5 MG/ML IJ SOLN
INTRAMUSCULAR | Status: AC
  Filled 2023-10-16: qty 30

## 2023-10-16 MED ORDER — LACTATED RINGERS IV SOLN: INTRAVENOUS | Status: DC

## 2023-10-16 MED ORDER — PROPOFOL 10 MG/ML IV BOLUS
INTRAVENOUS | Status: DC | PRN
  Administered 2023-10-16: 200 mg via INTRAVENOUS

## 2023-10-16 MED ORDER — PHENYLEPHRINE HCL-NACL 20-0.9 MG/250ML-% IV SOLN
INTRAVENOUS | Status: DC | PRN
  Administered 2023-10-16: 40 ug/min via INTRAVENOUS

## 2023-10-16 MED ORDER — OXYCODONE HCL 5 MG PO TABS: 5.0000 mg | ORAL_TABLET | ORAL | 0 refills | Status: AC | PRN

## 2023-10-16 MED ORDER — SODIUM CHLORIDE 0.9 % IV SOLN: 12.5000 mg | INTRAVENOUS | Status: DC | PRN

## 2023-10-16 MED ORDER — HYDROMORPHONE HCL 1 MG/ML IJ SOLN
0.2500 mg | INTRAMUSCULAR | Status: DC | PRN
  Administered 2023-10-16: 0.5 mg via INTRAVENOUS
  Filled 2023-10-16: qty 0.5

## 2023-10-16 MED ORDER — DEXAMETHASONE SODIUM PHOSPHATE 10 MG/ML IJ SOLN
INTRAMUSCULAR | Status: AC
  Filled 2023-10-16: qty 1

## 2023-10-16 MED ORDER — EPINEPHRINE PF 1 MG/ML IJ SOLN
INTRAMUSCULAR | Status: AC
  Filled 2023-10-16: qty 10

## 2023-10-16 MED ORDER — SUCCINYLCHOLINE CHLORIDE 200 MG/10ML IV SOSY
PREFILLED_SYRINGE | INTRAVENOUS | Status: DC | PRN
  Administered 2023-10-16: 160 mg via INTRAVENOUS

## 2023-10-16 MED ORDER — ONDANSETRON HCL 4 MG/2ML IJ SOLN
INTRAMUSCULAR | Status: DC | PRN
  Administered 2023-10-16: 4 mg via INTRAVENOUS

## 2023-10-16 MED ORDER — ASPIRIN 81 MG PO TBEC: 81.0000 mg | DELAYED_RELEASE_TABLET | Freq: Two times a day (BID) | ORAL | 0 refills | Status: AC

## 2023-10-16 MED ORDER — SODIUM CHLORIDE 0.9 % IR SOLN
Status: DC | PRN
  Administered 2023-10-16 (×5): 3000 mL

## 2023-10-16 MED ORDER — LABETALOL HCL 5 MG/ML IV SOLN
INTRAVENOUS | Status: DC | PRN
Start: 2023-10-16 — End: 2023-10-16
  Administered 2023-10-16: 5 mg via INTRAVENOUS

## 2023-10-16 MED ORDER — ONDANSETRON 4 MG PO TBDP: 4.0000 mg | ORAL_TABLET | Freq: Three times a day (TID) | ORAL | 0 refills | Status: AC | PRN

## 2023-10-16 MED ORDER — ACETAMINOPHEN 500 MG PO TABS
1000.0000 mg | ORAL_TABLET | Freq: Three times a day (TID) | ORAL | 0 refills | Status: AC
Start: 2023-10-16 — End: 2023-10-30

## 2023-10-16 MED ORDER — CELECOXIB 100 MG PO CAPS
100.0000 mg | ORAL_CAPSULE | Freq: Every day | ORAL | 0 refills | Status: AC
Start: 2023-10-16 — End: 2023-10-30

## 2023-10-16 MED ORDER — ORAL CARE MOUTH RINSE
15.0000 mL | Freq: Once | OROMUCOSAL | Status: AC
Start: 2023-10-16 — End: 2023-10-16

## 2023-10-16 MED ORDER — CHLORHEXIDINE GLUCONATE 0.12 % MT SOLN
15.0000 mL | Freq: Once | OROMUCOSAL | Status: AC
  Administered 2023-10-16: 15 mL via OROMUCOSAL
  Filled 2023-10-16: qty 15

## 2023-10-16 MED ORDER — SUGAMMADEX SODIUM 200 MG/2ML IV SOLN
INTRAVENOUS | Status: AC
  Filled 2023-10-16: qty 4

## 2023-10-16 SURGICAL SUPPLY — 50 items
ANCHOR SUT FBRTK 2.6 SP #5 (Anchor) IMPLANT
BLADE SURG SZ11 CARB STEEL (BLADE) ×2 IMPLANT
BNDG GAUZE DERMACEA FLUFF 4 (GAUZE/BANDAGES/DRESSINGS) ×2 IMPLANT
BURR OVAL 8 FLU 4.0X13 (MISCELLANEOUS) IMPLANT
CANNULA TWIST IN 8.25X7CM (CANNULA) IMPLANT
CHLORAPREP W/TINT 26 (MISCELLANEOUS) ×2 IMPLANT
CLOTH BEACON ORANGE TIMEOUT ST (SAFETY) ×2 IMPLANT
COOLER ICEMAN CLASSIC (MISCELLANEOUS) ×2 IMPLANT
COUNTER NDL MAGNETIC 40 RED (SET/KITS/TRAYS/PACK) ×2 IMPLANT
COUNTER NEEDLE MAGNETIC 40 RED (SET/KITS/TRAYS/PACK) ×1 IMPLANT
COVER LIGHT HANDLE STERIS (MISCELLANEOUS) ×4 IMPLANT
CUTTER BONE 4.0MM X 13CM (MISCELLANEOUS) IMPLANT
DRAPE SHOULDER BEACH CHAIR (DRAPES) ×2 IMPLANT
DRAPE U-SHAPE 47X51 STRL (DRAPES) ×2 IMPLANT
ELECTRODE REM PT RTRN 9FT ADLT (ELECTROSURGICAL) ×2 IMPLANT
GAUZE SPONGE 4X4 12PLY STRL (GAUZE/BANDAGES/DRESSINGS) ×2 IMPLANT
GAUZE XEROFORM 1X8 LF (GAUZE/BANDAGES/DRESSINGS) IMPLANT
GLOVE BIO SURGEON STRL SZ8 (GLOVE) ×4 IMPLANT
GLOVE BIOGEL PI IND STRL 7.0 (GLOVE) ×4 IMPLANT
GLOVE BIOGEL PI IND STRL 8 (GLOVE) ×2 IMPLANT
GOWN STRL REUS W/TWL LRG LVL3 (GOWN DISPOSABLE) ×2 IMPLANT
GOWN STRL REUS W/TWL XL LVL3 (GOWN DISPOSABLE) ×6 IMPLANT
KIT ANCHOR FBRTK 2.6 STR (KITS) IMPLANT
KIT POSITION SHOULDER SCHLEI (MISCELLANEOUS) ×2 IMPLANT
KIT TURNOVER KIT A (KITS) ×2 IMPLANT
MANIFOLD NEPTUNE II (INSTRUMENTS) ×2 IMPLANT
MARKER SKIN DUAL TIP RULER LAB (MISCELLANEOUS) ×2 IMPLANT
NDL HYPO 21X1.5 SAFETY (NEEDLE) ×2 IMPLANT
NDL SCORPION MULTI FIRE (NEEDLE) IMPLANT
NDL SPNL 18GX3.5 QUINCKE PK (NEEDLE) ×2 IMPLANT
NEEDLE HYPO 21X1.5 SAFETY (NEEDLE) ×1 IMPLANT
NEEDLE SCORPION MULTI FIRE (NEEDLE) ×1 IMPLANT
NEEDLE SPNL 18GX3.5 QUINCKE PK (NEEDLE) ×1 IMPLANT
NS IRRIG 1000ML POUR BTL (IV SOLUTION) ×2 IMPLANT
PACK TOTAL JOINT (CUSTOM PROCEDURE TRAY) ×2 IMPLANT
PAD ABD 5X9 TENDERSORB (GAUZE/BANDAGES/DRESSINGS) IMPLANT
PAD ARMBOARD POSITIONER FOAM (MISCELLANEOUS) ×2 IMPLANT
PAD COLD SHLDR SM WRAP-ON (PAD) ×2 IMPLANT
SET ARTHROSCOPY INST (INSTRUMENTS) ×2 IMPLANT
SET BASIN LINEN APH (SET/KITS/TRAYS/PACK) ×2 IMPLANT
SLING ARM ULTRA III XL (SLING) IMPLANT
SOL .9 NS 3000ML IRR UROMATIC (IV SOLUTION) ×4 IMPLANT
SUT 3-0 BLK 1X30 PSL (SUTURE) ×2 IMPLANT
SYR 10ML LL (SYRINGE) ×2 IMPLANT
SYR 30ML LL (SYRINGE) ×4 IMPLANT
TAPE CLOTH SOFT 2X10 (GAUZE/BANDAGES/DRESSINGS) IMPLANT
TOWEL OR 17X26 4PK STRL BLUE (TOWEL DISPOSABLE) ×2 IMPLANT
TUBE CONNECTING 12X1/4 (SUCTIONS) IMPLANT
TUBING IN/OUT FLOW W/MAIN PUMP (TUBING) ×2 IMPLANT
WAND ABLATOR APOLLO I90 (BUR) IMPLANT

## 2023-10-16 NOTE — Anesthesia Preprocedure Evaluation (Addendum)
 Anesthesia Evaluation  Patient identified by MRN, date of birth, ID band Patient awake    Reviewed: Allergy & Precautions, H&P , NPO status , Patient's Chart, lab work & pertinent test results  Airway Mallampati: III  TM Distance: <3 FB Neck ROM: Full    Dental no notable dental hx.    Pulmonary Current SmokerPatient did not abstain from smoking.   Pulmonary exam normal breath sounds clear to auscultation       Cardiovascular + CAD and + Past MI  Normal cardiovascular exam+ dysrhythmias  Rhythm:Regular Rate:Normal  Stent 2023 EF 55%   Neuro/Psych  PSYCHIATRIC DISORDERS Anxiety      Neuromuscular disease    GI/Hepatic Neg liver ROS,GERD  ,,  Endo/Other  negative endocrine ROS    Renal/GU Renal disease  negative genitourinary   Musculoskeletal  (+) Arthritis ,    Abdominal   Peds negative pediatric ROS (+)  Hematology negative hematology ROS (+)   Anesthesia Other Findings   Reproductive/Obstetrics negative OB ROS                              Anesthesia Physical Anesthesia Plan  ASA: 3  Anesthesia Plan: Regional   Post-op Pain Management:    Induction: Intravenous  PONV Risk Score and Plan:   Airway Management Planned: Nasal Cannula  Additional Equipment:   Intra-op Plan:   Post-operative Plan:   Informed Consent: I have reviewed the patients History and Physical, chart, labs and discussed the procedure including the risks, benefits and alternatives for the proposed anesthesia with the patient or authorized representative who has indicated his/her understanding and acceptance.     Dental advisory given  Plan Discussed with: CRNA and Surgeon  Anesthesia Plan Comments:          Anesthesia Quick Evaluation

## 2023-10-16 NOTE — Anesthesia Procedure Notes (Signed)
 Procedure Name: Intubation Date/Time: 10/16/2023 8:02 AM  Performed by: Pheobe Adine CROME, CRNAPre-anesthesia Checklist: Patient identified, Emergency Drugs available, Suction available, Patient being monitored and Timeout performed Patient Re-evaluated:Patient Re-evaluated prior to induction Oxygen Delivery Method: Circle system utilized Preoxygenation: Pre-oxygenation with 100% oxygen Induction Type: IV induction Grade View: Grade I Tube size: 7.5 mm Number of attempts: 1 Airway Equipment and Method: Video-laryngoscopy and Stylet Placement Confirmation: ETT inserted through vocal cords under direct vision, positive ETCO2, CO2 detector and breath sounds checked- equal and bilateral Secured at: 23 cm Tube secured with: Tape Dental Injury: Teeth and Oropharynx as per pre-operative assessment

## 2023-10-16 NOTE — Anesthesia Postprocedure Evaluation (Signed)
 Anesthesia Post Note  Patient: Derrick Turner  Procedure(s) Performed: ARTHROSCOPY, SHOULDER (Right: Shoulder) REPAIR, SHOULDER, SUBSCAPULARIS (Right: Shoulder)  Patient location during evaluation: PACU Anesthesia Type: Regional Level of consciousness: awake and alert Pain management: pain level controlled Vital Signs Assessment: post-procedure vital signs reviewed and stable Respiratory status: spontaneous breathing, nonlabored ventilation, respiratory function stable and patient connected to nasal cannula oxygen Cardiovascular status: blood pressure returned to baseline and stable Postop Assessment: no apparent nausea or vomiting Anesthetic complications: no   No notable events documented.   Last Vitals:  Vitals:   10/16/23 1021 10/16/23 1030  BP:  134/79  Pulse:  73  Resp:  16  Temp:    SpO2: 100% 97%    Last Pain:  Vitals:   10/16/23 1015  TempSrc:   PainSc: Asleep                 Andrea Limes

## 2023-10-16 NOTE — Interval H&P Note (Signed)
 History and Physical Interval Note:  10/16/2023 7:48 AM  Derrick Turner  has presented today for surgery, with the diagnosis of Right AC joint arthritis.  The various methods of treatment have been discussed with the patient and family. After consideration of risks, benefits and other options for treatment, the patient has consented to  Procedure(s) with comments: ARTHROSCOPY, SHOULDER (Right) - Distal clavicle excision as a surgical intervention.  The patient's history has been reviewed, patient examined, no change in status, stable for surgery.  I have reviewed the patient's chart and labs.  Questions were answered to the patient's satisfaction.     [name]

## 2023-10-16 NOTE — Op Note (Signed)
 Orthopaedic Surgery Operative Note (CSN: [id number])  Derrick Turner  Jul 08, 1970 Date of Surgery: 10/16/2023   Diagnoses:  Right Acromioclavicular joint arthritis Right shoulder subscapularis tear; upper border  Procedure: Arthroscopic subacromial decompression Arthroscopic rotator cuff repair Arthroscopic distal clavicle excision   Operative Finding Exam under anesthesia: Full range of motion.  No block to motion.  Well healed surgical incisions.  Articular space: No loose bodies, capsule intact, labrum intact Chondral surfaces:Intact, no sign of chondral degeneration on the glenoid or humeral head Biceps: Absent, likely from prior surgery Subscapularis: Fraying and tearing of the upper border of the subscapularis Superior Cuff: Intact Bursal side: Intact  Successful completion of the planned procedure.  We completed subacromial decompression, as well as a distal clavicle excision via arthroscopy.  We critically evaluated the subscapularis, and it was noted to be torn from its attachment at the upper border.  There was fraying, and minimal retraction.  This was repaired with a single knotless anchor.     Post-Op Diagnosis: Same Surgeons:Primary: Onesimo Oneil LABOR, MD Assistants: Montie Seltzer Location: [facility] OR ROOM 4 Anesthesia: General with interscalene block; local anesthetic at the portal incisions Antibiotics: Ancef  2 g Tourniquet time: None Estimated Blood Loss: Minimal Complications: None Specimens: None Implants: Implant Name Type Inv. Item Serial No. Manufacturer Lot No. LRB No. Used Action  ANCHOR SUT FBRTK 2.6 SP #5 - [id number] Anchor ANCHOR SUT FBRTK 2.6 SP #5  [employer] [id number] Right 1 Implanted    Indications for Surgery:   Derrick Turner is a 52 y.o. male with continued shoulder pain refractory to nonoperative measures for extended period of time.  Prior history of rotator cuff repair.  He attempted injections, both of the subacromial space, as well as the  [facility] joint.  Limited sustained improvement.  MRI demonstrated history of prior repair, without obvious tear.  There was inflammation and cystic changes at the [facility] joint, consistent with advanced AC joint arthritis.  The risks and benefits were explained at length including but not limited to continued pain, cuff failure, stiffness, need for further surgery, infection and more severe complications associate with anesthesia.  Elected proceed.  Surgical consent was finalized.   Procedure:   Patient was correctly identified in the preoperative holding area and operative site marked.  Patient brought to OR and positioned beachchair on an [place] table ensuring that all bony prominences were padded and the head was in an appropriate location.  Anesthesia was induced and the operative shoulder was prepped and draped in the usual sterile fashion.  Timeout was called preincision.  He received 2 g of Ancef  prior to making incision.  A standard posterior viewing portal was made after localizing the portal with a spinal needle.  An anterior accessory portal was also made.    The glenohumeral joint was evaluated.  There was some irritation, and fraying.  The intra-articular portion of the long head of the biceps tendon was previously tenotomized.  At the footprint of the upper border the subscapularis, there was some fraying.  We introduced a probe, and noted the upper border the subscapularis was torn.  There is minimal retraction.  Nonetheless, I elected to proceed with arthroscopic repair.  A cannula was introduced.  We then placed a single knotless fiber tack anchor at the footprint of the subscapularis.  A simple stitch was placed to be a [employer] instrument, within the healthy portion of the subscapularis tendon.  We then deployed the knotless mechanism, and tensioned the stitch.  This  provided an excellent repair.  Probe was introduced, and there is no further excursion of the upper border of the subscapularis.   Nothing further was needed to be completed within the glenohumeral joint.  The camera was then introduced within the subacromial space.  We made an additional lateral portal in order to clear the subacromial space first with a shaver, and then with electrocautery device.  The bursal side of the rotator cuff was evaluated.  There was minimal fraying.  There is no obvious tear.  Nothing else needed to be repaired.  He was introduced electrocautery, and the anterolateral portion of the acromion was identified.  We then skeletonized the undersurface of the acromion.  There were no spurs that needed to be addressed.  We continued using electrocautery to the [facility] joint.  The distal portion of the clavicle was identified.  We continued use electrocautery to clear the soft tissue from within the [facility] joint.  A bur was then introduced through the anterior portal.  The medial portion of the acromion was gently debrided in order to improve visibility.  We then used a bur to clear the [facility] joint, and excised the distal portion of the clavicle.  There was clearly cystic degeneration of the distal clavicle.  We were able to easily introduce the bur within the [facility] joint, at which point we felt as though enough bone had been removed.  Arthroscope and instruments were then removed from the subacromial space.   The incisions were closed with 3-0 nylon.  A sterile dressing was placed along with a sling. The patient was awoken from general anesthesia and taken to the PACU in stable condition without complication.    Post-operative plan:  The patient will be non-weightbearing in a sling. The patient will be discharged home. DVT prophylaxis not indicated in ambulatory upper extremity patient without known risk factors. Pain control with PRN pain medication preferring oral medicines.  Follow up plan will be scheduled in approximately 10-14 days for incision check and XR.

## 2023-10-16 NOTE — Transfer of Care (Signed)
 Immediate Anesthesia Transfer of Care Note  Patient: Derrick Turner  Procedure(s) Performed: ARTHROSCOPY, SHOULDER (Right: Shoulder) REPAIR, SHOULDER, SUBSCAPULARIS (Right: Shoulder)  Patient Location: PACU  Anesthesia Type:General  Level of Consciousness: awake, alert , drowsy, patient cooperative, and responds to stimulation  Airway & Oxygen Therapy: Patient Spontanous Breathing and Patient connected to nasal cannula oxygen  Post-op Assessment: Report given to RN and Post -op Vital signs reviewed and stable  Post vital signs: Reviewed and stable  Last Vitals:  Vitals Value Taken Time  BP 132/78 10/16/23 10:09  Temp    Pulse 73 10/16/23 10:13  Resp    SpO2 88 % 10/16/23 10:13  Vitals shown include unfiled device data.  Last Pain:  Vitals:   10/16/23 0716  TempSrc:   PainSc: 0-No pain      Patients Stated Pain Goal: 6 (10/16/23 0707)  Complications: No notable events documented.

## 2023-10-16 NOTE — H&P (Signed)
 Below is the most recent clinic note for Derrick Turner; any pertinent information regarding their recent medical history will be updated on the day of surgery.  Surgery originally scheduled for 8/11, but this had to be postponed.  He is ready to proceed with surgery  Return patient Visit  Assessment: Derrick Turner is a 53 y.o. male with the following: 1. Chronic pain of both shoulders; history of bilateral shoulder surgery.  Plan: Derrick Turner continues to have pain in both shoulders.  Right is bothering him more than the left.  At the last visit, we injected the [facility] joint.  This provided limited improvement in his symptoms.  He localizes his pain to the [facility] joint.  I do think would benefit from shoulder arthroscopy and distal clavicle excision.  This was discussed with him in clinic today.  Will reach out to his PCP, as well as his cardiologist to confirm clearance.  Will plan to proceed with surgery on September 18, 2023.  If unable to confirm clearance, we may have to delay surgery.  He states his understanding.  I will see him in clinic after surgery.  Risks and benefits of the surgery, including, but not limited to infection, bleeding, persistent pain, need for further surgery and more severe complications associated with anesthesia were discussed with the patient.  The patient has elected to proceed.     Follow-up: After surgery   Subjective:  Shoulder pain  History of Present Illness: Derrick Turner is a 53 y.o. male who returns for evaluation of bilateral shoulder pain.  Right continues to be more painful than the left.  Last visit, I injected his AC joint on the right.  This provided limited improvement in his symptoms.  He states it is not gotten worse.  However, he notes that he did not have improvement in his symptoms.  He continues to have difficulty with activities.  The left is not currently bothering him.  He is interested in surgery, if I think that it could  benefit him.  Review of Systems: No fevers or chills No numbness or tingling No chest pain No shortness of breath No bowel or bladder dysfunction No GI distress No headaches    Objective: BP (!) 146/73   Pulse 71   Temp 97.7 F (36.5 C) (Oral)   Resp 20   Ht 5' 10 (1.778 m)   Wt 104.3 kg   SpO2 95%   BMI 33.00 kg/m   Physical Exam:  General: Alert and oriented. and No acute distress. Gait: Normal gait.  Bilateral shoulders with well healed anterior based surgical incisions.  No surrounding erythema or drainage.  160 FF.  IR to Lumbar spine.  Positive Jobe's.  Tenderness to palpation over the [facility] joint on the right.  Pain with cross body adduction.  Mild weakness is appreciated in the supraspinatus bilaterally.   IMAGING: I personally ordered and reviewed the following images  MRI right shoulder  IMPRESSION: 1. Moderate supraspinatus tendon with fraying along the anterior bursal surface and a small low-grade interstitial tear. 2. Moderate infraspinatus and subscapularis tendinosis. 3. Moderate arthropathy of the acromioclavicular joint with subchondral marrow edema and cystic changes.    MRI left shoulder  IMPRESSION: 1. Moderate supraspinatus tendinosis with a partial-thickness articular surface tear measuring 20 mm AP and 22 mm medial collateral encompassing 60% of the tendon thickness. 2. Mild infraspinatus and subscapularis tendinosis.    [name], MD  10/16/2023 7:45 AM

## 2023-10-16 NOTE — Discharge Instructions (Addendum)
 Derrick A. Onesimo, MD MS [facility] [address] [place],  [place]  [address] Phone: [phone] Fax: [phone]    POST-OPERATIVE INSTRUCTIONS - SHOULDER ARTHROSCOPY  WOUND CARE You may remove the Operative Dressing on Post-Op Day #3 (72hrs after surgery).   Alternatively if you would like you can leave dressing on until follow-up if within 7-8 days but keep it dry. Leave steri-strips in place until they fall off on their own, usually 2 weeks postop. There may be a small amount of fluid/bleeding leaking at the surgical site. This is normal; the shoulder is filled with fluid during the procedure and can leak for 24-48hrs after surgery. ou may change/reinforce the bandage as needed.  Use the Cryocuff or Ice as often as possible for the first 7 days, then as needed for pain relief. Always keep a towel, ACE wrap or other barrier between the cooling unit and your skin.  You may shower on Post-Op Day #3. Gently pat the area dry. Do not soak the shoulder in water  or submerge it. Keep dry incisions as dry as possible. Do not go swimming in the pool or ocean until 4 weeks after surgery or when otherwise instructed.    EXERCISES/BRACING Sling should be used at all times until follow-up.  You can remove sling for hygiene.    Please continue to ambulate and do not stay sitting or lying for too long. Perform foot and wrist pumps to assist in circulation.  POST-OP MEDICATIONS- Multimodal approach to pain control In general your pain will be controlled with a combination of substances.  Prescriptions unless otherwise discussed are electronically sent to your pharmacy.  This is a carefully made plan we use to minimize narcotic use.    Meloxicam  OR Celebrex  - Anti-inflammatory medication taken on a scheduled basis Acetaminophen  - Non-narcotic pain medicine taken on a scheduled basis  Oxycodone  - This is a strong narcotic, to be used only on an "as needed" basis for  pain. Aspirin  81mg  - This medicine is used to minimize the risk of blood clots after surgery. Zofran  - take as needed for nausea   FOLLOW-UP If you develop a Fever (>=101.5), Redness or Drainage from the surgical incision site, please call our office to arrange for an evaluation. Please call the office to schedule a follow-up appointment for your suture removal, 10-14 days post-operatively.    HELPFUL INFORMATION  If you had a block, it will wear off between 8-24 hrs postop typically.  This is period when your pain may go from nearly zero to the pain you would have had postop without the block.  This is an abrupt transition but nothing dangerous is happening.  You may take an extra dose of narcotic when this happens.  You may be more comfortable sleeping in a semi-seated position the first few nights following surgery.  Keep a pillow propped under the elbow and forearm for comfort.  If you have a recliner type of chair it might be beneficial.  If not that is fine too, but it would be helpful to sleep propped up with pillows behind your operated shoulder as well under your elbow and forearm.  This will reduce pulling on the suture lines.  When dressing, put your operative arm in the sleeve first.  When getting undressed, take your operative arm out last.  Loose fitting, button-down shirts are recommended.  Often in the first days after surgery you may be more comfortable keeping your operative arm under  your shirt and not through the sleeve.  You may return to work/school in the next couple of days when you feel up to it.  Desk work and typing in the sling is     fine.  We suggest you use the pain medication the first night prior to going to bed, in order to ease any pain when the anesthesia wears off. You should avoid taking pain medications on an empty stomach as it will make you nauseous.  You should wean off your narcotic medicines as soon as you are able.  Most patients will be off or  using minimal narcotics before their first postop appointment.   Do not drink alcoholic beverages or take illicit drugs when taking pain medications.  It is against the law to drive while taking narcotics.  In some states it is against the law to drive while your arm is in a sling.   Pain medication may make you constipated.  Below are a few solutions to try in this order: Decrease the amount of pain medication if you aren't having pain. Drink lots of decaffeinated fluids. Drink prune juice and/or eat dried prunes  If the first 3 don't work start with additional solutions Take Colace - an over-the-counter stool softener Take Senokot - an over-the-counter laxative Take Miralax - a stronger over-the-counter laxative  NARCOTIC MANAGEMENT   Per [facility] clinic policy, our goal is ensure optimal postoperative pain control with a multimodal pain management strategy.   For all [facility] patients, our goal is to wean post-operative narcotic medications by 6 weeks post-operatively.   If this is not possible due to utilization of pain medication prior to surgery, your [facility] doctor will support your acute post-operative pain control for the first 6 weeks postoperatively, with a plan to transition you back to your primary pain team following that. Maralee will work to ensure a Therapist, occupational.

## 2023-10-16 NOTE — Anesthesia Procedure Notes (Signed)
 Anesthesia Regional Block: Supraclavicular block   Pre-Anesthetic Checklist: , timeout performed,  Correct Patient, Correct Site, Correct Laterality,  Correct Procedure, Correct Position, site marked,  Risks and benefits discussed,  Surgical consent,  Pre-op evaluation,  At surgeon's request and post-op pain management  Laterality: Right  Prep: chloraprep       Needles:  Injection technique: Single-shot  Needle Type: Stimiplex     Needle Length: 4cm  Needle Gauge: 22     Additional Needles:   Procedures:,,,, ultrasound used (permanent image in chart),,    Narrative:  Start time: 10/16/2023 7:30 AM End time: 10/16/2023 7:39 AM Injection made incrementally with aspirations every 5 mL.  Performed by: Personally  CRNA: Pheobe Adine CROME, CRNA

## 2023-10-17 ENCOUNTER — Encounter (HOSPITAL_COMMUNITY): Payer: Self-pay | Admitting: Orthopedic Surgery

## 2023-10-18 ENCOUNTER — Encounter: Payer: Self-pay | Admitting: Cardiovascular Disease

## 2023-10-18 ENCOUNTER — Encounter (HOSPITAL_COMMUNITY)
Admission: RE | Admit: 2023-10-18 | Discharge: 2023-10-18 | Disposition: A | Discharge status: Home / Self Care | Source: Ambulatory Visit | Attending: Optometry | Admitting: Optometry

## 2023-10-19 ENCOUNTER — Telehealth (HOSPITAL_COMMUNITY): Payer: Self-pay | Admitting: *Deleted

## 2023-10-19 NOTE — Telephone Encounter (Signed)
 error

## 2023-10-27 ENCOUNTER — Ambulatory Visit: Admitting: Family Medicine

## 2023-10-27 ENCOUNTER — Encounter: Payer: Self-pay | Admitting: Orthopedic Surgery

## 2023-10-27 ENCOUNTER — Other Ambulatory Visit (INDEPENDENT_AMBULATORY_CARE_PROVIDER_SITE_OTHER): Payer: Self-pay

## 2023-10-27 ENCOUNTER — Ambulatory Visit (INDEPENDENT_AMBULATORY_CARE_PROVIDER_SITE_OTHER): Admitting: Orthopedic Surgery

## 2023-10-27 DIAGNOSIS — M19011 Primary osteoarthritis, right shoulder: Secondary | ICD-10-CM

## 2023-10-27 DIAGNOSIS — S46811D Strain of other muscles, fascia and tendons at shoulder and upper arm level, right arm, subsequent encounter: Secondary | ICD-10-CM

## 2023-10-27 NOTE — Progress Notes (Signed)
 Orthopaedic Postop Note  Assessment: Derrick TRICKETT is a 53 y.o. male s/p right shoulder arthroscopy, repair of subscapularis and distal clavicle excision  DOS: 10/16/2023  Plan: Sutures were removed, Steri-Strips placed. Radiographs were reviewed Seizure was discussed He is to remain in the sling; okay to remove the pillow Medications as needed Initiate physical therapy after the next visit.  Follow-up: Return in about 4 weeks (around 11/24/2023). XR at next visit: None  Subjective:  Chief Complaint  Patient presents with   Routine Post Op    R DOS 10/16/23    History of Present Illness: Derrick Turner is a 53 y.o. male who presents following the above stated procedure.  Surgery was approximately 10 days ago.  He is doing well.  He is only taking Tylenol  at this point.  He has remained in the sling.  No numbness or tingling.  No issues with his incisions.  His shoulder is tight, but feels better overall.  Review of Systems: No fevers or chills No numbness or tingling No Chest Pain No shortness of breath   Objective: There were no vitals taken for this visit.  Physical Exam:  Surgical incisions are healing well.  No surrounding erythema or drainage.  Sensation is intact throughout the right upper extremity.  Fingers are warm and well-perfused.  He has some discomfort, but tolerates gentle range of motion.  IMAGING: I personally ordered and reviewed the following images:  X-rays of the right shoulder were obtained in clinic today.  No acute injuries are noted.  Well-positioned glenohumeral joint.  No proximal humeral migration.  Prior surgical implants are visualized without change in positioning.  No acute fractures.  No bony lesions.  Interval decompression of the [facility] [place] joint, with distal clavicle excision, no complication.  Impression: Negative right shoulder x-ray following shoulder arthroscopy  [name], MD 10/27/2023 9:14 AM

## 2023-11-24 ENCOUNTER — Encounter: Payer: Self-pay | Admitting: Orthopedic Surgery

## 2023-11-24 ENCOUNTER — Ambulatory Visit (INDEPENDENT_AMBULATORY_CARE_PROVIDER_SITE_OTHER): Admitting: Orthopedic Surgery

## 2023-11-24 VITALS — Wt 220.0 lb

## 2023-11-24 DIAGNOSIS — M19011 Primary osteoarthritis, right shoulder: Secondary | ICD-10-CM

## 2023-11-24 DIAGNOSIS — S46811D Strain of other muscles, fascia and tendons at shoulder and upper arm level, right arm, subsequent encounter: Secondary | ICD-10-CM

## 2023-11-24 NOTE — Progress Notes (Signed)
 Orthopaedic Postop Note  Assessment: Derrick Turner is a 53 y.o. male s/p right shoulder arthroscopy, repair of subscapularis and distal clavicle excision  DOS: 10/16/2023  Plan: Mr. Derrick Turner has done well.  He continues to use a sling.  Surgery was approximately 6 weeks ago.  We will initiate physical therapy.  A referral has been placed.  He can start coming out of the sling.  Initiate pendulum activities.  Medicines as needed.  Follow-up in 6 weeks.  Follow-up: Return in about 6 weeks (around 01/05/2024). XR at next visit: None  Subjective:  Chief Complaint  Patient presents with   Follow-up    Right shoulder DOS 10/16/23- shoulder little angry today. Taking tylenol  for the pain    History of Present Illness: Derrick Turner is a 53 y.o. male who presents following the above stated procedure.  Surgery was approximately 6 weeks ago.  He has done well.  He states he was trying to move some stuff at home, and has irritated his shoulder.  He is complaining of pain in the anterior shoulder, as well as the superior shoulder.  He denies a popping sensation.  He does not think that he has injured his shoulder, but it is irritated today.   Review of Systems: No fevers or chills No numbness or tingling No Chest Pain No shortness of breath   Objective: Wt 220 lb (99.8 kg)   BMI 31.57 kg/m   Physical Exam:  Incisions have healed.  No surrounding erythema or drainage.  Sensation is intact throughout the right upper extremity.  There is no bruising.  He has tenderness within the trapezius.  Mild tenderness over the anterior shoulder.  He tolerates passive forward flexion to 100 degrees.  90 degrees of passive abduction.  He has some discomfort in the anterior shoulder with passive external rotation to 25 degrees.   IMAGING: I personally ordered and reviewed the following images:  No new imaging obtained today.   Derrick DELENA Horde, MD 11/24/2023 9:16 AM

## 2023-11-24 NOTE — Addendum Note (Signed)
 Addended by: MARCINE HUSBAND T on: 11/24/2023 09:52 AM   Modules accepted: Orders

## 2023-11-24 NOTE — Patient Instructions (Signed)
 Sling as needed  Okay for gentle stretching  Heat and ice to help with motion and swelling  We have placed a referral for physical therapy  Please contact us  if you have not heard from therapy within the next week.  Okay to initiate pendulum activities  Pendulum   Stand near a wall or a surface that you can hold onto for balance. Bend at the waist and let your left / right arm hang straight down. Use your other arm to support you. Keep your back straight and do not lock your knees. Relax your left / right arm and shoulder muscles, and move your hips and your trunk so your left / right arm swings freely. Your arm should swing because of the motion of your body, not because you are using your arm or shoulder muscles. Keep moving your hips and trunk so your arm swings in the following directions, as told by your health care provider: Side to side. Forward and backward. In clockwise and counterclockwise circles. Continue each motion for 20 seconds, or for as long as told by your health care provider. Slowly return to the starting position.  Repeat 10 times. Complete this exercise daily.

## 2023-11-27 ENCOUNTER — Other Ambulatory Visit: Payer: Self-pay

## 2023-11-27 ENCOUNTER — Encounter (HOSPITAL_COMMUNITY): Payer: Self-pay

## 2023-11-27 ENCOUNTER — Encounter (HOSPITAL_COMMUNITY)
Admission: RE | Admit: 2023-11-27 | Discharge: 2023-11-27 | Disposition: A | Discharge status: Home / Self Care | Source: Ambulatory Visit | Attending: Optometry | Admitting: Optometry

## 2023-11-27 NOTE — H&P (Signed)
 Surgical History & Physical  Patient Name: Derrick Turner  DOB: 03-Mar-1970  Surgery: Cataract extraction with intraocular lens implant phacoemulsification; Left Eye Surgeon: Marsa Cleverly MD Surgery Date: 12/01/2023 Pre-Op Date: 09/06/2023  HPI: A [name] old male patient present for a cataract eval, self referral. 1. The patient states he was told a few years ago he had a cataract starting OS. Vision has progressively worsened over the past year OS. Difficulties with all activities, depth perception is off. Unable to go through a drive thru and judge the cup being handed to him. Driving is becoming harder. The episode is constant. The condition's severity is worsening.  Medical History: Cataracts  Arthritis Heart Problem LDL  Review of Systems Cardiovascular CAD, hx heart attack 2023 Musculoskeletal arthritis pains All recorded systems are negative except as noted above.  Social Current every day smoker  Medication Prednisolone-moxiflox-bromfen,  Atorvastatin , Ergocalciferol  (vitamin D2), Citalopram   Sx/Procedure Partial colectomy, Shoulder surgery bilateral, Back Surgery, Stents due to heart attack, Hand surgery  Drug Allergies  NKDA  History & Physical: Heent: cataracts NECK: supple without bruits LUNGS: lungs clear to auscultation CV: regular rate and rhythm Abdomen: soft and non-tender  Impression & Plan: Assessment: 1.  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  CONJUNCTIVOCHALASIS; Both Eyes (H11.823) 3.  BLEPHARITIS; Right Upper Lid, Right Lower Lid, Left Upper Lid, Left Lower Lid (H01.001, H01.002,H01.004,H01.005) 4.  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  Cataracts are visually significant and account for the patient's complaints. Discussed all risks, benefits, procedures and recovery, including infection, loss of vision and eye, need for glasses after surgery or additional procedures. Patient understands changing glasses will not  improve vision. Patient indicated understanding of procedure. All questions answered. Patient desires to have surgery, recommend phacoemulsification with intraocular lens. Patient to have preliminary testing necessary ([employer]/IOL Master, Mac OCT, TOPO) Educational materials provided:Cataract.  Plan: - Proceed with cataract surgery OS, hold on OD for now - Plan for best distance target with DIB00 - No DM, no fuchs, no prior eye surgery - good dilation - Dextenza  if available  2.  Artificial tears PRN  3.  Blepharitis with telangiectasias and blocked glands. Will use warm compresses once daily long term. Lid scrubs one to two times weekly.  4.  Few scattered drusen, monitor for now

## 2023-11-27 NOTE — Progress Notes (Signed)
   11/27/23 1018  OBSTRUCTIVE SLEEP APNEA  Have you ever been diagnosed with sleep apnea through a sleep study? No  Do you snore loudly (loud enough to be heard through closed doors)?  1  Do you often feel tired, fatigued, or sleepy during the daytime (such as falling asleep during driving or talking to someone)? 0  Has anyone observed you stop breathing during your sleep? 1  Do you have, or are you being treated for high blood pressure? 1  BMI more than 35 kg/m2? 0  Age > 50 (1-yes) 1  Neck circumference greater than:Male 16 inches or larger, Male 17inches or larger? 0  Male Gender (Yes=1) 1  Obstructive Sleep Apnea Score 5  Score 5 or greater  Results sent to PCP

## 2023-11-30 ENCOUNTER — Ambulatory Visit: Attending: Orthopedic Surgery | Admitting: Physical Therapy

## 2023-11-30 ENCOUNTER — Other Ambulatory Visit: Payer: Self-pay

## 2023-11-30 ENCOUNTER — Encounter: Payer: Self-pay | Admitting: Physical Therapy

## 2023-11-30 DIAGNOSIS — R29898 Other symptoms and signs involving the musculoskeletal system: Secondary | ICD-10-CM | POA: Diagnosis present

## 2023-11-30 DIAGNOSIS — M19011 Primary osteoarthritis, right shoulder: Secondary | ICD-10-CM | POA: Insufficient documentation

## 2023-11-30 DIAGNOSIS — M25511 Pain in right shoulder: Secondary | ICD-10-CM | POA: Diagnosis present

## 2023-11-30 DIAGNOSIS — M25611 Stiffness of right shoulder, not elsewhere classified: Secondary | ICD-10-CM | POA: Insufficient documentation

## 2023-11-30 DIAGNOSIS — S46811D Strain of other muscles, fascia and tendons at shoulder and upper arm level, right arm, subsequent encounter: Secondary | ICD-10-CM | POA: Insufficient documentation

## 2023-11-30 DIAGNOSIS — M6281 Muscle weakness (generalized): Secondary | ICD-10-CM | POA: Diagnosis present

## 2023-11-30 NOTE — Therapy (Signed)
 OUTPATIENT PHYSICAL THERAPY SHOULDER EVALUATION   Patient Name: Derrick Turner MRN: [id number] DOB:[birth year], 53 y.o., male Today's Date: 11/30/2023  END OF SESSION:  PT End of Session - 11/30/23 1110     Visit Number 1    Number of Visits 17    Date for Recertification  01/25/24    Authorization Type Aet[employer]    PT Start Time 1015    PT Stop Time 1054    PT Time Calculation (min) 39 min    Activity Tolerance Patient tolerated treatment well    Behavior During Therapy [facility] for tasks assessed/performed          Past Medical History:  Diagnosis Date   Arthritis    Coronary artery disease cardiologist--- dr croitoru   ETT 04-01-2016 in epic, reproduced chest pain but no EKG changes;  cardiac cath 04-06-2016 moderate diffuse nonobstructive disease, aggressive medical management   High cholesterol    History of 2019 novel coronavirus disease (COVID-19) 03/02/2020   per pt tested positive covid, copy of result in epic, stated mild symptoms that resolved   History of kidney stones    Incomplete right bundle branch block    Myocardial infarction [facility])    Right ureteral calculus    Wears glasses    Past Surgical History:  Procedure Laterality Date   APPENDECTOMY  teen   BACK SURGERY     two   BIOPSY  08/11/2020   Procedure: BIOPSY;  Surgeon: Cindie Carlin POUR, DO;  Location: [facility];  Service: Endoscopy;;   COLONOSCOPY WITH PROPOFOL  N/A 08/11/2020   Procedure: COLONOSCOPY WITH PROPOFOL ;  Surgeon: Cindie Carlin POUR, DO;  Location: [facility];  Service: Endoscopy;  Laterality: N/A;  7:30am   CORONARY/GRAFT ACUTE MI REVASCULARIZATION N/A 07/16/2021   Procedure: Coronary/Graft Acute MI Revascularization;  Surgeon: Verlin Lonni BIRCH, MD;  Location: [facility] INVASIVE CV LAB;  Service: Cardiovascular;  Laterality: N/A;   CYSTOSCOPY WITH RETROGRADE PYELOGRAM, URETEROSCOPY AND STENT PLACEMENT Right 04/14/2020   Procedure: CYSTOSCOPY WITH RETROGRADE PYELOGRAM, URETEROSCOPY  AND STENT PLACEMENT;  Surgeon: Rosalind Zachary NOVAK, MD;  Location: [facility];  Service: Urology;  Laterality: Right;   EXTRACORPOREAL SHOCK WAVE LITHOTRIPSY Right 03/23/2020   Procedure: EXTRACORPOREAL SHOCK WAVE LITHOTRIPSY (ESWL);  Surgeon: Carolee Sherwood BIRCH DOUGLAS, MD;  Location: [facility];  Service: Urology;  Laterality: Right;   HOLMIUM LASER APPLICATION Right 04/14/2020   Procedure: HOLMIUM LASER APPLICATION;  Surgeon: Rosalind Zachary NOVAK, MD;  Location: [facility];  Service: Urology;  Laterality: Right;   LAPAROSCOPIC CHOLECYSTECTOMY  2012 approx   LEFT HEART CATH AND CORONARY ANGIOGRAPHY N/A 04/06/2016   Procedure: Left Heart Cath and Coronary Angiography;  Surgeon: Ozell Fell, MD;  Location: [facility] INVASIVE CV LAB;  Service: Cardiovascular;  Laterality: N/A;   LEFT HEART CATH AND CORONARY ANGIOGRAPHY N/A 07/16/2021   Procedure: LEFT HEART CATH AND CORONARY ANGIOGRAPHY;  Surgeon: Verlin Lonni BIRCH, MD;  Location: [facility] INV[facility] CV LAB;  Service: Cardiovascular;  Laterality: N/A;   OPEN SUBSCAPULARIS REPAIR Right 10/16/2023   Procedure: REPAIR, SHOULDER, SUBSCAPULARIS;  Surgeon: Onesimo Oneil LABOR, MD;  Location: [facility] ORS;  Service: Orthopedics;  Laterality: Right;   ORCHIECTOMY Right 1990s   per pt benign cyst   PARTIAL COLECTOMY Right 09/02/2020   Procedure: HEMICOLECTOMY;  Surgeon: Mavis Oneil, MD;  Location: [facility] OR[facility];  Service: General;  Laterality: Right;   POLYPECTOMY  08/11/2020   Procedure: POLYPECTOMY;  Surgeon: Cindie Carlin POUR, DO;  Location: [facility]  ENDO [facility]ITE;  Service: Endoscopy;;   SHOULDER ARTHROSCOPY Right 10/16/2023   Procedure: ARTHROSCOPY, SHOULDER;  Surgeon: Onesimo Oneil LABOR, MD;  Location: [facility];  Service: Orthopedics;  Laterality: Right;  Distal clavicle excision   SHOULDER SURGERY Bilateral left 2019;  right 2016   arthroscopy   SHOULDER SURGERY Left    SHOULDER SURGERY Right    Patient Active Problem List   Diagnosis Date Noted    Diarrhea 06/30/2023   Elevated BP without diagnosis of hypertension 02/28/2023   Right hand pain 01/27/2023   Family history of prostate cancer in father 11/04/2022   Hematuria, gross 11/01/2022   Arthralgia 10/28/2022   Anxiety 06/10/2022   Generalized muscle ache 06/10/2022   Nephrolithiasis 04/05/2022   Lesion of left ear 11/05/2021   Tinnitus, bilateral 08/04/2021   Acute MI, 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HCC)    S/P partial colectomy 09/02/2020   Dysplastic colon polyp    Cigarette nicotine  dependence without complication 09/26/2017   Chronic right-sided low back pain with right-sided sciatica 09/26/2017   Mixed hyperlipidemia 09/26/2017   CAD (coronary artery disease) 05/03/2016   Tobacco abuse    Prediabetes    Gastroesophageal reflux disease    ACS (acute coronary syndrome) (HCC) 03/31/2016   Pelvic floor dysfunction 01/14/2016   Urinary hesitancy 12/21/2015    PCP: Edman Das FNP   REFERRING PROVIDER: Onesimo Oneil LABOR, MD  REFERRING DIAG:  Diagnosis  M19.011 (ICD-10-CM) - Arthritis of right acromioclavicular joint  S46.811D (ICD-10-CM) - Partial tear of right subscapularis tendon, subsequent encounter    THERAPY DIAG:  Acute pain of right shoulder  Stiffness of right shoulder, not elsewhere classified  Muscle weakness (generalized)  Other symptoms and signs involving the musculoskeletal system  Rationale for Evaluation and Treatment: Rehabilitation  ONSET DATE: s/p R shoulder SAD/DCE, rotator cuff repair, subscap repair 10/16/23  SUBJECTIVE:                                                                                                                                                                                      SUBJECTIVE STATEMENT:  Surgery was a little more than I thought it was gonna be. Shoulder feels like frozen gel. Its very stiff without much movement, just got out of the sling last Friday. Doctor gave me pendulums and  that's about it. Had some odd  complications after a trigger finger surgery last year, had to see a lot of rheumatologists to r/o autoimmune disease and recover overall.    Hand dominance: Left  PERTINENT HISTORY: See above   PAIN:  Are you having pain? Pain isn't bad, its just tight but not really painful   PRECAUTIONS: Shoulder  RED FLAGS: None   WEIGHT BEARING RESTRICTIONS: No  FALLS:  Has patient fallen in last 6 months? 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   PATIENT SURVEYS:  PSFS: THE PATIENT SPECIFIC FUNCTIONAL SCALE  Place score of 0-10 (0 = unable to perform activity and 10 = able to perform activity at the same level as before injury or problem)  Activity Date: 11/30/23    Working as an Personnel officer  0    2. Reaching [place]  0    3. Putting on belt  5    4.      Total Score 1.7      Total Score = Sum of activity scores/number of activities  Minimally Detectable Change: 3 points (for single activity); 2 points (for average score)  [facility] (nd). The Patient Specific Functional Scale . Retrieved from [email or web address]   COGNITION: Overall cognitive status: Within functional limits for tasks assessed       POSTURE: Forward head, rounded shoulders   UPPER EXTREMITY ROM:    ROM Right eval  Shoulder flexion PROM 110-120* pain, AROM supine 100-110* pain  Shoulder extension   Shoulder scaption  PROM 110-120*,   Shoulder adduction   Shoulder internal rotation Hand to belly at 0* ABD   Shoulder external rotation 0-10* at 0* ABD  supine PROM; able to get to neutral AROM  Elbow flexion   Elbow extension   Wrist flexion   Wrist extension   Wrist ulnar deviation   Wrist radial deviation   Wrist pronation   Wrist supination   (Blank rows = not tested)  UPPER EXTREMITY MMT:  MMT Right eval Left eval  Shoulder flexion    Shoulder extension    Shoulder abduction    Shoulder adduction    Shoulder internal rotation    Shoulder external rotation    Middle trapezius    Lower trapezius    Elbow flexion    Elbow extension    Wrist flexion    Wrist extension    Wrist ulnar deviation    Wrist radial deviation    Wrist pronation    Wrist supination    Grip strength (lbs)    (Blank rows = not tested)  DNT MMT at eval due to pain with basic ROM at eval, functional MMT for flexion and ER approximately 3/5 based on AROM testing    PALPATION:   TTP anterior shoulder  TREATMENT DATE:   11/30/23  Eval, POC, HEP  HEP practice and discussion  Shoulder PROM and stretching as appropriate     PATIENT EDUCATION: Education details: exam findings, POC, HEP, post-op limitations from subscap repair (most conservative part of his surgery/slowest recovering) Person educated: Patient Education method: Explanation, Demonstration, Verbal cues, and Handouts Education comprehension: verbalized understanding, returned demonstration, and needs further education  HOME EXERCISE PROGRAM:   Access Code: [email or web address] URL: [email or web address] Date: 11/30/2023 Prepared by: Josette Rough  Exercises - Supine Shoulder Flexion Extension AAROM with Dowel  - 2-3 x daily - 7 x weekly - 1 sets - 10 reps - 3 seconds  hold - Supine Shoulder Press AAROM in Abduction with Dowel  - 2-3 x daily - 7 x weekly - 1 sets - 10 reps - Seated Shoulder External Rotation AAROM with Cane and Hand in Neutral  -  2-3 x daily - 7 x weekly - 1 sets - 10 reps - 3 seconds  hold - Shoulder Flexion Wall Slide with Towel  - 2-3 x daily - 7 x weekly - 1 sets - 10 reps - 3 seconds  hold - Seated Scapular Retraction  - 2-3 x daily - 7 x weekly - 1 sets - 10 reps - Seated Backward Shoulder Rolls  - 2-3 x daily - 7 x weekly - 1 sets - 10 reps  ASSESSMENT:  CLINICAL IMPRESSION: Patient is a 53 y.o. M who was seen today for physical therapy evaluation and treatment for  Diagnosis  M19.011 (ICD-10-CM) - Arthritis of right acromioclavicular joint  S46.811D (ICD-10-CM) - Partial tear of right subscapularis tendon, subsequent encounter  . He received surgery as noted above on 10/16/23, also had subscapularis repair which at this point remains the most limiting part of his protocol. MD did not send a formal post-op protocol so used subscap repair protocol from [employer]. Will challenge him as appropriate and able as per protocol and postop restrictions.   OBJECTIVE IMPAIRMENTS: decreased mobility, decreased ROM, decreased strength, increased fascial restrictions, increased muscle spasms, impaired UE functional use, improper body mechanics, postural dysfunction, obesity, and pain.   ACTIVITY LIMITATIONS: carrying, lifting, continence, bathing, toileting, dressing, reach over head, hygiene/grooming, and caring for others  PARTICIPATION LIMITATIONS: meal prep, cleaning, laundry, driving, shopping, community activity, occupation, and yard work  PERSONAL FACTORS: Age, Behavior pattern, Education, Fitness, Past/current experiences, Profession, Social background, and Time since onset of injury/illness/exacerbation are also affecting patient's functional outcome.   REHAB POTENTIAL: Good  CLINICAL DECISION MAKING: Stable/uncomplicated  EVALUATION COMPLEXITY: Low   GOALS: Goals reviewed with patient? No  SHORT TERM GOALS: Target date: 12/28/2023    Patient will be compliant with appropriate progressive HEP         GOAL STATUS: Initial  2. R  shoulder flexion and scaption AROM to be at least 150*         GOAL STATUS: Initial    3. R shoulder ER and IR A/PROM to be at least 45* at 45* ABD           GOAL STATUS: Initial    4. Will be more aware of posture with all functional tasks with use of ergonomic aides PRN/as desired      GOAL STATUS: Initial    LONG TERM GOALS: Target date: 01/25/2024    MMT to have improved by at least one grade in all weak groups       GOAL STATUS: Initial    2. AROM to have  normalized and will be pain free all planes of motion      GOAL STATUS: Initial    3. Pain to be no more than 2/10 with all functional tasks     GOAL STATUS: Initial   4. Will be able to perform all functional household and work based tasks without increase from resting pain levels including [place] work for his job as an Personnel officer      GOAL STATUS: Initial   5. PSFS  to have improved by at least 3 points to show improved QOL and subjective perception of condition      GOAL STATUS: Initial    PLAN:  PT FREQUENCY: 2x/week  PT DURATION: 8 weeks  PLANNED INTERVENTIONS: 97164- PT Re-evaluation, 97750- Physical Performance Testing, 97110-Therapeutic exercises, 97530- Therapeutic activity, [id number]- Neuromuscular re-education, 97535- Self Care, 02859- Manual therapy, [id number]- Electrical stimulation (manual), [id number]- Vasopneumatic device, [id number]- Ultrasound, 02966- Ionotophoresis 4mg /ml Dexamethasone , and Patient/Family education  PLAN FOR NEXT SESSION:  per protocol- 6 weeks s/p surgery 11/27/23; subscap repair is most conservative portion of his surgery so using protcol below:   [email or web address]  Having cataract surgery 10/24- need to clarify precautions including if he will be able to lay flat after   Josette Rough, PT, DPT 11/30/23 11:11 AM

## 2023-12-01 ENCOUNTER — Encounter (HOSPITAL_COMMUNITY): Payer: Self-pay | Admitting: Optometry

## 2023-12-01 ENCOUNTER — Ambulatory Visit (HOSPITAL_BASED_OUTPATIENT_CLINIC_OR_DEPARTMENT_OTHER): Payer: Self-pay | Admitting: Certified Registered Nurse Anesthetist

## 2023-12-01 ENCOUNTER — Ambulatory Visit (HOSPITAL_COMMUNITY): Payer: Self-pay | Admitting: Certified Registered Nurse Anesthetist

## 2023-12-01 ENCOUNTER — Encounter (HOSPITAL_COMMUNITY)
Admission: RE | Disposition: A | Discharge status: Home / Self Care | Payer: Self-pay | Source: Home / Self Care | Attending: Optometry

## 2023-12-01 ENCOUNTER — Ambulatory Visit (HOSPITAL_COMMUNITY)
Admission: RE | Admit: 2023-12-01 | Discharge: 2023-12-01 | Disposition: A | Discharge status: Home / Self Care | Attending: Optometry | Admitting: Optometry

## 2023-12-01 DIAGNOSIS — Z79899 Other long term (current) drug therapy: Secondary | ICD-10-CM | POA: Insufficient documentation

## 2023-12-01 DIAGNOSIS — F17210 Nicotine dependence, cigarettes, uncomplicated: Secondary | ICD-10-CM

## 2023-12-01 DIAGNOSIS — H0100A Unspecified blepharitis right eye, upper and lower eyelids: Secondary | ICD-10-CM | POA: Insufficient documentation

## 2023-12-01 DIAGNOSIS — H11823 Conjunctivochalasis, bilateral: Secondary | ICD-10-CM | POA: Diagnosis not present

## 2023-12-01 DIAGNOSIS — H2513 Age-related nuclear cataract, bilateral: Secondary | ICD-10-CM | POA: Insufficient documentation

## 2023-12-01 DIAGNOSIS — I2510 Atherosclerotic heart disease of native coronary artery without angina pectoris: Secondary | ICD-10-CM

## 2023-12-01 DIAGNOSIS — H0100B Unspecified blepharitis left eye, upper and lower eyelids: Secondary | ICD-10-CM | POA: Diagnosis not present

## 2023-12-01 DIAGNOSIS — H35363 Drusen (degenerative) of macula, bilateral: Secondary | ICD-10-CM | POA: Insufficient documentation

## 2023-12-01 DIAGNOSIS — F17200 Nicotine dependence, unspecified, uncomplicated: Secondary | ICD-10-CM | POA: Diagnosis not present

## 2023-12-01 DIAGNOSIS — I10 Essential (primary) hypertension: Secondary | ICD-10-CM | POA: Diagnosis not present

## 2023-12-01 DIAGNOSIS — H2512 Age-related nuclear cataract, left eye: Secondary | ICD-10-CM

## 2023-12-01 DIAGNOSIS — F419 Anxiety disorder, unspecified: Secondary | ICD-10-CM | POA: Insufficient documentation

## 2023-12-01 DIAGNOSIS — K219 Gastro-esophageal reflux disease without esophagitis: Secondary | ICD-10-CM | POA: Insufficient documentation

## 2023-12-01 HISTORY — PX: CATARACT EXTRACTION W/PHACO: SHX586

## 2023-12-01 SURGERY — PHACOEMULSIFICATION, CATARACT, WITH IOL INSERTION
Anesthesia: Monitor Anesthesia Care | Site: Eye | Laterality: Left

## 2023-12-01 MED ORDER — SODIUM CHLORIDE 0.9% FLUSH
INTRAVENOUS | Status: DC | PRN
  Administered 2023-12-01: 10 mL via INTRAVENOUS

## 2023-12-01 MED ORDER — LIDOCAINE HCL 3.5 % OP GEL
1.0000 | Freq: Once | OPHTHALMIC | Status: AC
  Administered 2023-12-01: 1 via OPHTHALMIC

## 2023-12-01 MED ORDER — MIDAZOLAM HCL (PF) 2 MG/2ML IJ SOLN
INTRAMUSCULAR | Status: DC | PRN
  Administered 2023-12-01: 2 mg via INTRAVENOUS

## 2023-12-01 MED ORDER — MIDAZOLAM HCL 2 MG/2ML IJ SOLN
INTRAMUSCULAR | Status: AC
  Filled 2023-12-01: qty 2

## 2023-12-01 MED ORDER — TETRACAINE HCL 0.5 % OP SOLN: 1.0000 [drp] | OPHTHALMIC | Status: DC

## 2023-12-01 MED ORDER — TETRACAINE HCL 0.5 % OP SOLN
1.0000 [drp] | OPHTHALMIC | Status: AC | PRN
  Administered 2023-12-01 (×3): 1 [drp] via OPHTHALMIC

## 2023-12-01 MED ORDER — POVIDONE-IODINE 5 % OP SOLN
OPHTHALMIC | Status: DC | PRN
  Administered 2023-12-01: 1 via OPHTHALMIC

## 2023-12-01 MED ORDER — MOXIFLOXACIN HCL 5 MG/ML IO SOLN
INTRAOCULAR | Status: DC | PRN
  Administered 2023-12-01: .2 mL via INTRACAMERAL

## 2023-12-01 MED ORDER — STERILE WATER FOR IRRIGATION IR SOLN
Status: DC | PRN
  Administered 2023-12-01: 1

## 2023-12-01 MED ORDER — SIGHTPATH DOSE#1 NA HYALUR & NA CHOND-NA HYALUR IO KIT
PACK | INTRAOCULAR | Status: DC | PRN
  Administered 2023-12-01: 1 via OPHTHALMIC

## 2023-12-01 MED ORDER — TROPICAMIDE 1 % OP SOLN
1.0000 [drp] | OPHTHALMIC | Status: AC | PRN
  Administered 2023-12-01 (×3): 1 [drp] via OPHTHALMIC

## 2023-12-01 MED ORDER — PHENYLEPHRINE HCL 2.5 % OP SOLN: 1.0000 [drp] | OPHTHALMIC | Status: DC

## 2023-12-01 MED ORDER — PHENYLEPHRINE HCL 2.5 % OP SOLN
1.0000 [drp] | OPHTHALMIC | Status: AC | PRN
  Administered 2023-12-01 (×3): 1 [drp] via OPHTHALMIC

## 2023-12-01 MED ORDER — TROPICAMIDE 1 % OP SOLN: 1.0000 [drp] | OPHTHALMIC | Status: DC

## 2023-12-01 MED ORDER — BSS IO SOLN
INTRAOCULAR | Status: DC | PRN
  Administered 2023-12-01: 15 mL via INTRAOCULAR

## 2023-12-01 MED ORDER — LIDOCAINE HCL 3.5 % OP GEL: 1.0000 | Freq: Once | OPHTHALMIC | Status: DC

## 2023-12-01 MED ORDER — PHENYLEPHRINE-KETOROLAC 1-0.3 % IO SOLN
INTRAOCULAR | Status: DC | PRN
  Administered 2023-12-01: 500 mL via OPHTHALMIC

## 2023-12-01 MED ORDER — LACTATED RINGERS IV SOLN: INTRAVENOUS | Status: DC

## 2023-12-01 MED ORDER — LIDOCAINE HCL (PF) 1 % IJ SOLN
INTRAMUSCULAR | Status: DC | PRN
  Administered 2023-12-01: 1 mL

## 2023-12-01 SURGICAL SUPPLY — 12 items
CLOTH BEACON ORANGE TIMEOUT ST (SAFETY) ×2 IMPLANT
DRSG TEGADERM 4X4.75 (GAUZE/BANDAGES/DRESSINGS) ×2 IMPLANT
EYE SHIELD UNIVERSAL CLEAR (GAUZE/BANDAGES/DRESSINGS) IMPLANT
FEE CATARACT SUITE SIGHTPATH (MISCELLANEOUS) ×2 IMPLANT
GLOVE BIOGEL PI IND STRL 7.0 (GLOVE) ×4 IMPLANT
LENS IOL TECNIS EYHANCE 23.0 (Intraocular Lens) IMPLANT
NDL HYPO 18GX1.5 BLUNT FILL (NEEDLE) ×2 IMPLANT
NEEDLE HYPO 18GX1.5 BLUNT FILL (NEEDLE) ×1 IMPLANT
PAD ARMBOARD POSITIONER FOAM (MISCELLANEOUS) ×2 IMPLANT
SYR TB 1ML LL NO SAFETY (SYRINGE) ×2 IMPLANT
TAPE SURG TRANSPORE 1 IN (GAUZE/BANDAGES/DRESSINGS) IMPLANT
WATER STERILE IRR 250ML POUR (IV SOLUTION) ×2 IMPLANT

## 2023-12-01 NOTE — Transfer of Care (Signed)
 Immediate Anesthesia Transfer of Care Note  Patient: Derrick Turner  Procedure(s) Performed: PHACOEMULSIFICATION, CATARACT, WITH IOL INSERTION (Left: Eye)  Patient Location: Short Stay  Anesthesia Type:MAC  Level of Consciousness: awake, alert , oriented, and patient cooperative  Airway & Oxygen Therapy: Patient Spontanous Breathing  Post-op Assessment: Report given to RN, Post -op Vital signs reviewed and stable, and Patient moving all extremities X 4  Post vital signs: Reviewed and stable  Last Vitals:  Vitals Value Taken Time  BP 142/98 12/01/23 07:58  Temp 36.4 C 12/01/23 07:58  Pulse 71 12/01/23 07:58  Resp 18 12/01/23 07:58  SpO2 99 % 12/01/23 07:58    Last Pain:  Vitals:   12/01/23 0758  TempSrc: Oral  PainSc: 0-No pain      Patients Stated Pain Goal: 5 (12/01/23 0758)  Complications: No notable events documented.

## 2023-12-01 NOTE — Discharge Instructions (Signed)
 Please discharge patient when stable, will follow up today with Dr. Ilsa Iha at the [facility] office immediately following discharge.  Leave shield in place until visit.  All paperwork with discharge instructions will be given at the office.  [facility] Address:  [address]  [place], [place] [address]  Dr. Chaya Jan Phone: [phone]

## 2023-12-01 NOTE — Anesthesia Preprocedure Evaluation (Signed)
 Anesthesia Evaluation  Patient identified by MRN, date of birth, ID band Patient awake    Reviewed: Allergy & Precautions, H&P , NPO status , Patient's Chart, lab work & pertinent test results, reviewed documented beta blocker date and time   Airway Mallampati: II  TM Distance: >3 FB Neck ROM: full    Dental no notable dental hx.    Pulmonary neg pulmonary ROS, Current Smoker and Patient abstained from smoking.   Pulmonary exam normal breath sounds clear to auscultation       Cardiovascular Exercise Tolerance: Good hypertension, + CAD and + Past MI  + dysrhythmias  Rhythm:regular Rate:Normal     Neuro/Psych  PSYCHIATRIC DISORDERS Anxiety      Neuromuscular disease    GI/Hepatic Neg liver ROS,GERD  ,,  Endo/Other  negative endocrine ROS    Renal/GU Renal disease  negative genitourinary   Musculoskeletal   Abdominal   Peds  Hematology negative hematology ROS (+)   Anesthesia Other Findings   Reproductive/Obstetrics negative OB ROS                              Anesthesia Physical Anesthesia Plan  ASA: 3  Anesthesia Plan: MAC   Post-op Pain Management:    Induction:   PONV Risk Score and Plan:   Airway Management Planned:   Additional Equipment:   Intra-op Plan:   Post-operative Plan:   Informed Consent: I have reviewed the patients History and Physical, chart, labs and discussed the procedure including the risks, benefits and alternatives for the proposed anesthesia with the patient or authorized representative who has indicated his/her understanding and acceptance.     Dental Advisory Given  Plan Discussed with: CRNA  Anesthesia Plan Comments:         Anesthesia Quick Evaluation

## 2023-12-01 NOTE — Interval H&P Note (Signed)
 History and Physical Interval Note:  12/01/2023 7:17 AM  The H and P was reviewed and updated. The patient was examined.  No changes were found after exam.  The surgical eye was marked.    Derrick Turner

## 2023-12-01 NOTE — Op Note (Signed)
 Date of procedure: 12/01/23  Pre-operative diagnosis: Visually significant age-related nuclear cataract, Left Eye (H25.12)  Post-operative diagnosis: Visually significant age-related nuclear cataract, Left Eye H25.12  Procedure: Removal of cataract via phacoemulsification and insertion of intra-ocular lens J&J DIB00 +23.0D into the capsular bag of the Left Eye  Attending surgeon: Marsa JINNY Cleverly, MD  Anesthesia: MAC, Topical Akten  Complications: None  Estimated Blood Loss: <44mL (minimal)  Specimens: None  Implants:  Implant Name Type Inv. Item Serial No. Manufacturer Lot No. LRB No. Used Action  LENS IOL TECNIS EYHANCE 23.0 - [id number] Intraocular Lens LENS IOL TECNIS EYHANCE 23.0 [id number] [employer]  Left 1 Implanted    Indications:  Visually significant age-related cataract, Left Eye  Procedure:  The patient was seen and identified in the pre-operative area. The operative eye was identified and dilated.  The operative eye was marked.  Topical anesthesia was administered to the operative eye.     The patient was then to the operative suite and placed in the supine position.  A timeout was performed confirming the patient, procedure to be performed, and all other relevant information.   The patient's face was prepped and draped in the usual fashion for intra-ocular surgery.  A lid speculum was placed into the operative eye and the surgical microscope moved into place and focused.  An inferotemporal paracentesis was created using a 20 gauge paracentesis blade.  BSS mixed with Omidria, followed by 1% lidocaine  was injected into the anterior chamber.  Viscoelastic was injected into the anterior chamber.  A temporal clear-corneal main wound incision was created using a 2.26mm microkeratome.  A continuous curvilinear capsulorrhexis was initiated using an irrigating cystitome and completed using capsulorrhexis forceps.  Hydrodissection and hydrodeliniation were performed.  Viscoelastic  was injected into the anterior chamber.  A phacoemulsification handpiece and a chopper as a second instrument were used to remove the nucleus and epinucleus. The irrigation/aspiration handpiece was used to remove any remaining cortical material.   The capsular bag was reinflated with viscoelastic, checked, and found to be intact.  The intraocular lens was inserted into the capsular bag.  The irrigation/aspiration handpiece was used to remove any remaining viscoelastic.  The clear corneal wound and paracentesis wounds were then hydrated and checked with Weck-Cels to be watertight. Moxifloxacin was instilled into the anterior chamber.  The lid-speculum and drape were removed. The patient's face was cleaned with a wet and dry 4x4.  A clear shield was taped over the eye. The patient was taken to the post-operative care unit in good condition, having tolerated the procedure well.  Post-Op Instructions: The patient will follow up at [facility] for a same day post-operative evaluation and will receive all other orders and instructions.

## 2023-12-01 NOTE — Anesthesia Postprocedure Evaluation (Signed)
 Anesthesia Post Note  Patient: Derrick Turner  Procedure(s) Performed: PHACOEMULSIFICATION, CATARACT, WITH IOL INSERTION (Left: Eye)  Patient location during evaluation: Phase II Anesthesia Type: MAC Level of consciousness: awake Pain management: pain level controlled Vital Signs Assessment: post-procedure vital signs reviewed and stable Respiratory status: spontaneous breathing and respiratory function stable Cardiovascular status: blood pressure returned to baseline and stable Postop Assessment: no headache and no apparent nausea or vomiting Anesthetic complications: no Comments: Late entry   No notable events documented.   Last Vitals:  Vitals:   12/01/23 0649 12/01/23 0758  BP: (P) 129/89 (!) 142/98  Pulse: (P) 72 71  Resp: (P) 18 18  Temp: (P) 36.6 C 36.4 C  SpO2: (P) 97% 99%    Last Pain:  Vitals:   12/01/23 0758  TempSrc: Oral  PainSc: 0-No pain                 Yvonna JINNY Bosworth

## 2023-12-07 ENCOUNTER — Ambulatory Visit: Admitting: Physical Therapy

## 2023-12-07 ENCOUNTER — Encounter: Payer: Self-pay | Admitting: Physical Therapy

## 2023-12-07 DIAGNOSIS — R29898 Other symptoms and signs involving the musculoskeletal system: Secondary | ICD-10-CM

## 2023-12-07 DIAGNOSIS — M6281 Muscle weakness (generalized): Secondary | ICD-10-CM

## 2023-12-07 DIAGNOSIS — M25511 Pain in right shoulder: Secondary | ICD-10-CM | POA: Diagnosis not present

## 2023-12-07 DIAGNOSIS — M25611 Stiffness of right shoulder, not elsewhere classified: Secondary | ICD-10-CM

## 2023-12-07 NOTE — Therapy (Signed)
 OUTPATIENT PHYSICAL THERAPY SHOULDER TREATMENT    Patient Name: Derrick Turner MRN: [id number] DOB:Jun 25, 1970, 53 y.o., male Today's Date: 12/07/2023  END OF SESSION:  PT End of Session - 12/07/23 1044     Visit Number 2    Number of Visits 17    Date for Recertification  01/25/24    Authorization Type [employer]    PT Start Time 1017    PT Stop Time 1055    PT Time Calculation (min) 38 min    Activity Tolerance Patient tolerated treatment well    Behavior During Therapy [facility] for tasks assessed/performed           Past Medical History:  Diagnosis Date   Arthritis    Coronary artery disease cardiologist--- dr croitoru   ETT 04-01-2016 in epic, reproduced chest pain but no EKG changes;  cardiac cath 04-06-2016 moderate diffuse nonobstructive disease, aggressive medical management   High cholesterol    History of 2019 novel coronavirus disease (COVID-19) 03/02/2020   per pt tested positive covid, copy of result in epic, stated mild symptoms that resolved   History of kidney stones    Incomplete right bundle branch block    Myocardial infarction [facility])    Right ureteral calculus    Wears glasses    Past Surgical History:  Procedure Laterality Date   APPENDECTOMY  teen   BACK SURGERY     two   BIOPSY  08/11/2020   Procedure: BIOPSY;  Surgeon: Cindie Carlin POUR, DO;  Location: [facility];  Service: Endoscopy;;   CATARACT EXTRACTION W/PHACO Left 12/01/2023   Procedure: PHACOEMULSIFICATION, CATARACT, WITH IOL INSERTION;  Surgeon: Juli Blunt, MD;  Location: [facility];  Service: Ophthalmology;  Laterality: Left;  CDE: 7.56   COLONOSCOPY WITH PROPOFOL  N/A 08/11/2020   Procedure: COLONOSCOPY WITH PROPOFOL ;  Surgeon: Cindie Carlin POUR, DO;  [facility]: [facility];  Service: Endoscopy;  Laterality: N/A;  7:30am   CORONARY/GRAFT ACUTE MI REVASCULARIZATION N/A 07/16/2021   Procedure: Coronary/Graft Acute MI Revascularization;  Surgeon: Verlin Lonni BIRCH, MD;  Location:  [facility] INV[facility];  Service: Cardiovascular;  Laterality: N/A;   CYSTOSCOPY WITH RETROGRADE PYELOGRAM, URETEROSCOPY AND STENT PLACEMENT Right 04/14/2020   Procedure: CYSTOSCOPY WITH RETROGRADE PYELOGRAM, URETEROSCOPY AND STENT PLACEMENT;  Surgeon: Rosalind Zachary NOVAK, MD;  Location: [facility];  Service: Urology;  Laterality: Right;   EXTRACORPOREAL SHOCK WAVE LITHOTRIPSY Right 03/23/2020   Procedure: EXTRACORPOREAL SHOCK WAVE LITHOTRIPSY (ESWL);  Surgeon: Carolee Sherwood BIRCH DOUGLAS, MD;  Location: [facility];  Service: Urology;  Laterality: Right;   HOLMIUM LASER APPLICATION Right 04/14/2020   Procedure: HOLMIUM LASER APPLICATION;  Surgeon: Rosalind Zachary NOVAK, MD;  Location: [facility];  Service: Urology;  Laterality: Right;   LAPAROSCOPIC CHOLECYSTECTOMY  2012 approx   LEFT HEART CATH AND CORONARY ANGIOGRAPHY N/A 04/06/2016   Procedure: Left Heart Cath and Coronary Angiography;  Surgeon: Ozell Fell, MD;  Location: [facility] INVASIVE CV LAB;  Service: Cardiovascular;  Laterality: N/A;   LEFT HEART CATH AND CORONARY ANGIOGRAPHY N/A 07/16/2021   Procedure: LEFT HEART CATH AND CORONARY ANGIOGRAPHY;  Surgeon: Verlin Lonni BIRCH, MD;  Location: [facility];  Service: Cardiovascular;  Laterality: N/A;   OPEN SUBSCAPULARIS REPAIR Right 10/16/2023   Procedure: REPAIR, SHOULDER, SUBSCAPULARIS;  Surgeon: Onesimo Oneil LABOR, MD;  Location: [facility];  Service: Orthopedics;  Laterality: Right;   ORCHIECTOMY Right 1990s   per pt benign cyst   PARTIAL COLECTOMY Right 09/02/2020  Procedure: HEMICOLECTOMY;  Surgeon: Mavis Anes, MD;  Location: [facility];  Service: General;  Laterality: Right;   POLYPECTOMY  08/11/2020   Procedure: POLYPECTOMY;  Surgeon: Cindie Carlin POUR, DO;  [facility];  Service: Endoscopy;;   SHOULDER ARTHROSCOPY Right 10/16/2023   Procedure: ARTHROSCOPY, SHOULDER;  Surgeon: Onesimo Anes LABOR, MD;  Location: [facility];  Service: Orthopedics;   Laterality: Right;  Distal clavicle excision   SHOULDER SURGERY Bilateral left 2019;  right 2016   arthroscopy   SHOULDER SURGERY Left    SHOULDER SURGERY Right    Patient Active Problem List   Diagnosis Date Noted   Diarrhea 06/30/2023   Elevated BP without diagnosis of hypertension 02/28/2023   Right hand pain 01/27/2023   Family history of prostate cancer in father 11/04/2022   Hematuria, gross 11/01/2022   Arthralgia 10/28/2022   Anxiety 06/10/2022   Generalized muscle ache 06/10/2022   Nephrolithiasis 04/05/2022   Lesion of left ear 11/05/2021   Tinnitus, bilateral 08/04/2021   Acute MI, 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HCC)    S/P partial colectomy 09/02/2020   Dysplastic colon polyp    Cigarette nicotine  dependence without complication 09/26/2017   Chronic right-sided low back pain with right-sided sciatica 09/26/2017   Mixed hyperlipidemia 09/26/2017   CAD (coronary artery disease) 05/03/2016   Tobacco abuse    Prediabetes    Gastroesophageal reflux disease    ACS (acute coronary syndrome) (HCC) 03/31/2016   Pelvic floor dysfunction 01/14/2016   Urinary hesitancy 12/21/2015    PCP: Edman Das FNP   REFERRING PROVIDER: Onesimo Anes LABOR, MD  REFERRING DIAG:  Diagnosis  M19.011 (ICD-10-CM) - Arthritis of right acromioclavicular joint  S46.811D (ICD-10-CM) - Partial tear of right subscapularis tendon, subsequent encounter    THERAPY DIAG:  Acute pain of right shoulder  Stiffness of right shoulder, not elsewhere classified  Muscle weakness (generalized)  Other symptoms and signs involving the musculoskeletal system  Rationale for Evaluation and Treatment: Rehabilitation  ONSET DATE: s/p R shoulder SAD/DCE, rotator cuff repair, subscap repair 10/16/23  SUBJECTIVE:                                                                                                                                                                                      SUBJECTIVE  STATEMENT:  Shoulder is still angry. Cataract surgery went really well, surgeon said that as long as my eye is facing up I can lay flat for PT- wants me to keep my eye above my waist. I was lazy on my exercises because of the eye issues, I just got back to them a couple of days ago.     EVAL: Surgery  was a little more than I thought it was gonna be. Shoulder feels like frozen gel. Its very stiff without much movement, just got out of the sling last Friday. Doctor gave me pendulums and  that's about it. Had some odd complications after a trigger finger surgery last year, had to see a lot of rheumatologists to r/o autoimmune disease and recover overall.    Hand dominance: Left  PERTINENT HISTORY: See above   PAIN:  Are you having pain? Yes: NPRS scale: 2/10 Pain location: R shoulder  Pain description: tightness  Aggravating factors: exercises/moving shoulder in general  Relieving factors: ice and heat   PRECAUTIONS: Shoulder  RED FLAGS: None   WEIGHT BEARING RESTRICTIONS: No  FALLS:  Has patient fallen in last 6 months? 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   PATIENT SURVEYS:  PSFS: THE PATIENT SPECIFIC FUNCTIONAL SCALE  Place score of 0-10 (0 = unable to perform activity and 10 = able to perform activity at the same level as before injury or problem)  Activity Date: 11/30/23    Working as an personnel officer  0    2. Reaching [place]  0    3. Putting on belt  5    4.      Total Score 1.7      Total Score = Sum of activity scores/number of activities  Minimally Detectable Change: 3 points  (for single activity); 2 points (for average score)  [facility] (nd). The Patient Specific Functional Scale . Retrieved from [email or web address]   COGNITION: Overall cognitive status: Within functional limits for tasks assessed       POSTURE: Forward head, rounded shoulders   UPPER EXTREMITY ROM:    ROM Right eval Right 12/07/23  Shoulder flexion PROM 110-120* pain, AROM supine 100-110* pain PROM 150*  Shoulder extension    Shoulder scaption  PROM 110-120*,  PROM 150*  Shoulder adduction    Shoulder internal rotation Hand to belly at 0* ABD    Shoulder external rotation 0-10* at 0* ABD supine PROM; able to get to neutral AROM At 0* ABD, 20-30* PROM  Elbow flexion    Elbow extension    Wrist flexion    Wrist extension    Wrist ulnar deviation    Wrist radial deviation    Wrist pronation    Wrist supination    (Blank rows = not tested)  UPPER EXTREMITY MMT:  MMT Right eval Left eval  Shoulder flexion    Shoulder extension    Shoulder abduction    Shoulder adduction    Shoulder internal rotation    Shoulder external rotation    Middle trapezius    Lower trapezius    Elbow flexion    Elbow extension    Wrist flexion    Wrist extension    Wrist ulnar deviation    Wrist radial deviation    Wrist pronation    Wrist supination    Grip strength (lbs)    (Blank rows = not tested)  DNT MMT at eval due to pain with basic ROM at eval, functional MMT for flexion and ER approximately  3/5 based on AROM testing    PALPATION:   TTP anterior shoulder                                                                                                                              TREATMENT DATE:    12/07/23  Shoulder ROM/stretching as per protocol, GH oscillations   Supine flexion AAROM 15x5 seconds  Seated ER AAROM with cane x15 to 45* only per protocol  Pulleys for shoulder elevation AAROM x15 Door  slides for flexion with pillowcase x12  Isometric shoulder flexion x10   Education on ongoing precautions and progressions in PT    11/30/23  Eval, POC, HEP  HEP practice and discussion  Shoulder PROM and stretching as appropriate     PATIENT EDUCATION: Education details: exam findings, POC, HEP, post-op limitations from subscap repair (most conservative part of his surgery/slowest recovering) Person educated: Patient Education method: Explanation, Demonstration, Verbal cues, and Handouts Education comprehension: verbalized understanding, returned demonstration, and needs further education  HOME EXERCISE PROGRAM:   Access Code: [email or web address] URL: [email or web address] Date: 11/30/2023 Prepared by: Josette Rough  Exercises - Supine Shoulder Flexion Extension AAROM with Dowel  - 2-3 x daily - 7 x weekly - 1 sets - 10 reps - 3 seconds  hold - Supine Shoulder Press AAROM in Abduction with Dowel  - 2-3 x daily - 7 x weekly - 1 sets - 10 reps - Seated Shoulder External Rotation AAROM with Cane and Hand in Neutral  - 2-3 x daily - 7 x weekly - 1 sets - 10 reps - 3 seconds  hold - Shoulder Flexion Wall Slide with Towel  - 2-3 x daily - 7 x weekly - 1 sets - 10 reps - 3 seconds  hold - Seated Scapular Retraction  - 2-3 x daily - 7 x weekly - 1 sets - 10 reps - Seated Backward Shoulder Rolls  - 2-3 x daily - 7 x weekly - 1 sets - 10 reps  ASSESSMENT:  CLINICAL IMPRESSION:   Arrived today doing well, per eye surgeon ok for him to lay flat for PT but we kept his head elevated a bit today and minimized  supine exercises/activities as much as possible. Progressed per protocol, tolerated all interventions well today. Was a bit more aggressive in terms of ROM work as he is very close to being at 8 weeks out from his surgery.    EVAL: Patient is a 53 y.o. M who was seen today for physical therapy evaluation and treatment for  Diagnosis  M19.011 (ICD-10-CM) - Arthritis of right  acromioclavicular joint  S46.811D (ICD-10-CM) - Partial tear of right subscapularis tendon, subsequent encounter  . He received surgery as noted above on 10/16/23, also had subscapularis repair which at this point remains the most limiting part of his protocol. MD did not send a formal post-op protocol so used subscap repair protocol from [employer]. Will challenge him as  appropriate and able as per protocol and postop restrictions.   OBJECTIVE IMPAIRMENTS: decreased mobility, decreased ROM, decreased strength, increased fascial restrictions, increased muscle spasms, impaired UE functional use, improper body mechanics, postural dysfunction, obesity, and pain.   ACTIVITY LIMITATIONS: carrying, lifting, continence, bathing, toileting, dressing, reach over head, hygiene/grooming, and caring for others  PARTICIPATION LIMITATIONS: meal prep, cleaning, laundry, driving, shopping, community activity, occupation, and yard work  PERSONAL FACTORS: Age, Behavior pattern, Education, Fitness, Past/current experiences, Profession, Social background, and Time since onset of injury/illness/exacerbation are also affecting patient's functional outcome.   REHAB POTENTIAL: Good  CLINICAL DECISION MAKING: Stable/uncomplicated  EVALUATION COMPLEXITY: Low   GOALS: Goals reviewed with patient? No  SHORT TERM GOALS: Target date: 12/28/2023    Patient will be compliant with appropriate progressive HEP        GOAL STATUS: Initial  2. R  shoulder flexion and scaption AROM to be at least 150*         GOAL STATUS: Initial    3. R shoulder ER and IR A/PROM to be at least 45* at 45* ABD           GOAL STATUS: Initial    4. Will be more aware of posture with all functional tasks with use of ergonomic aides PRN/as desired      GOAL STATUS: Initial    LONG TERM GOALS: Target date: 01/25/2024    MMT to have improved by at least one grade in all weak groups       GOAL STATUS: Initial    2. AROM to  have normalized and will be pain free all planes of motion      GOAL STATUS: Initial    3. Pain to be no more than 2/10 with all functional tasks     GOAL STATUS: Initial   4. Will be able to perform all functional household and work based tasks without increase from resting pain levels including [place] work for his job as an personnel officer      GOAL STATUS: Initial   5. PSFS  to have improved by at least 3 points to show improved QOL and subjective perception of condition      GOAL STATUS: Initial    PLAN:  PT FREQUENCY: 2x/week  PT DURATION: 8 weeks  PLANNED INTERVENTIONS: 97164- PT Re-evaluation, 97750- Physical Performance Testing, 97110-Therapeutic exercises, 97530- Therapeutic activity, [id number]- Neuromuscular re-education, 97535- Self Care, [id number]- Manual therapy, [id number]- Electrical stimulation (manual), [id number]- Vasopneumatic device, [id number]- Ultrasound, [id number]- Ionotophoresis 4mg /ml Dexamethasone , and Patient/Family education  PLAN FOR NEXT SESSION:  per protocol- 7 weeks s/p surgery 12/04/23; subscap repair is most conservative portion of his surgery so using protcol below:   [email or web address]  Keep head elevated in supine, no bending forward- eye needs to be above waist per pt  Josette Rough, PT, DPT 12/07/23 11:01 AM

## 2023-12-11 ENCOUNTER — Encounter: Payer: Self-pay | Admitting: Radiology

## 2023-12-12 ENCOUNTER — Ambulatory Visit: Attending: Orthopedic Surgery | Admitting: Physical Therapy

## 2023-12-12 ENCOUNTER — Encounter: Payer: Self-pay | Admitting: Physical Therapy

## 2023-12-12 DIAGNOSIS — M6281 Muscle weakness (generalized): Secondary | ICD-10-CM | POA: Insufficient documentation

## 2023-12-12 DIAGNOSIS — M25511 Pain in right shoulder: Secondary | ICD-10-CM | POA: Insufficient documentation

## 2023-12-12 DIAGNOSIS — R29898 Other symptoms and signs involving the musculoskeletal system: Secondary | ICD-10-CM | POA: Insufficient documentation

## 2023-12-12 DIAGNOSIS — M25611 Stiffness of right shoulder, not elsewhere classified: Secondary | ICD-10-CM | POA: Insufficient documentation

## 2023-12-12 NOTE — Therapy (Signed)
 OUTPATIENT PHYSICAL THERAPY SHOULDER TREATMENT    Patient Name: Derrick Turner MRN: [id number] DOB:[birth year], 53 y.o., male Today's Date: 12/12/2023  END OF SESSION:  PT End of Session - 12/12/23 1346     Visit Number 3    Number of Visits 17    Date for Recertification  01/25/24    Authorization Type [employer]    PT Start Time 1342    PT Stop Time 1420    PT Time Calculation (min) 38 min    Activity Tolerance Patient tolerated treatment well    Behavior During Therapy [facility] for tasks assessed/performed           Past Medical History:  Diagnosis Date   Arthritis    Coronary artery disease cardiologist--- dr croitoru   ETT 04-01-2016 in epic, reproduced chest pain but no EKG changes;  cardiac cath 04-06-2016 moderate diffuse nonobstructive disease, aggressive medical management   High cholesterol    History of 2019 novel coronavirus disease (COVID-19) 03/02/2020   per pt tested positive covid, copy of result in epic, stated mild symptoms that resolved   History of kidney stones    Incomplete right bundle branch block    Myocardial infarction [facility])    Right ureteral calculus    Wears glasses    Past Surgical History:  Procedure Laterality Date   APPENDECTOMY  teen   BACK SURGERY     two   BIOPSY  08/11/2020   Procedure: BIOPSY;  Surgeon: Cindie Carlin POUR, DO;  Location: [facility];  Service: Endoscopy;;   CATARACT EXTRACTION W/PHACO Left 12/01/2023   Procedure: PHACOEMULSIFICATION, CATARACT, WITH IOL INSERTION;  Surgeon: Juli Blunt, MD;  Location: [facility] OR[facility];  Service: Ophthalmology;  Laterality: Left;  CDE: 7.56   COLONOSCOPY WITH PROPOFOL  N/A 08/11/2020   Procedure: COLONOSCOPY WITH PROPOFOL ;  Surgeon: Cindie Carlin POUR, DO;  [facility]: [facility];  Service: Endoscopy;  Laterality: N/A;  7:30am   CORONARY/GRAFT ACUTE MI REVASCULARIZATION N/A 07/16/2021   Procedure: Coronary/Graft Acute MI Revascularization;  Surgeon: Verlin Lonni BIRCH, MD;  Location: [facility]  INVASIVE CV LAB;  Service: Cardiovascular;  Laterality: N/A;   CYSTOSCOPY WITH RETROGRADE PYELOGRAM, URETEROSCOPY AND STENT PLACEMENT Right 04/14/2020   Procedure: CYSTOSCOPY WITH RETROGRADE PYELOGRAM, URETEROSCOPY AND STENT PLACEMENT;  Surgeon: Rosalind Zachary NOVAK, MD;  Location: [facility];  Service: Urology;  Laterality: Right;   EXTRACORPOREAL SHOCK WAVE LITHOTRIPSY Right 03/23/2020   Procedure: EXTRACORPOREAL SHOCK WAVE LITHOTRIPSY (ESWL);  Surgeon: Carolee Sherwood BIRCH DOUGLAS, MD;  Location: [facility];  Service: Urology;  Laterality: Right;   HOLMIUM LASER APPLICATION Right 04/14/2020   Procedure: HOLMIUM LASER APPLICATION;  Surgeon: Rosalind Zachary NOVAK, MD;  Location: [facility];  Service: Urology;  Laterality: Right;   LAPAROSCOPIC CHOLECYSTECTOMY  2012 approx   LEFT HEART CATH AND CORONARY ANGIOGRAPHY N/A 04/06/2016   Procedure: Left Heart Cath and Coronary Angiography;  Surgeon: Ozell Fell, MD;  Location: [facility] INVASIVE CV LAB;  Service: Cardiovascular;  Laterality: N/A;   LEFT HEART CATH AND CORONARY ANGIOGRAPHY N/A 07/16/2021   Procedure: LEFT HEART CATH AND CORONARY ANGIOGRAPHY;  Surgeon: Verlin Lonni BIRCH, MD;  Location: [facility] INVASIVE CV LAB;  Service: Cardiovascular;  Laterality: N/A;   OPEN SUBSCAPULARIS REPAIR Right 10/16/2023   Procedure: REPAIR, SHOULDER, SUBSCAPULARIS;  Surgeon: Onesimo Oneil LABOR, MD;  Location: [facility];  Service: Orthopedics;  Laterality: Right;   ORCHIECTOMY Right 1990s   per pt benign cyst   PARTIAL COLECTOMY Right 09/02/2020  Procedure: HEMICOLECTOMY;  Surgeon: Mavis Anes, MD;  Location: [facility];  Service: General;  Laterality: Right;   POLYPECTOMY  08/11/2020   Procedure: POLYPECTOMY;  Surgeon: Cindie Carlin POUR, DO;  [facility];  Service: Endoscopy;;   SHOULDER ARTHROSCOPY Right 10/16/2023   Procedure: ARTHROSCOPY, SHOULDER;  Surgeon: Onesimo Anes LABOR, MD;  Location: [facility];  Service: Orthopedics;   Laterality: Right;  Distal clavicle excision   SHOULDER SURGERY Bilateral left 2019;  right 2016   arthroscopy   SHOULDER SURGERY Left    SHOULDER SURGERY Right    Patient Active Problem List   Diagnosis Date Noted   Diarrhea 06/30/2023   Elevated BP without diagnosis of hypertension 02/28/2023   Right hand pain 01/27/2023   Family history of prostate cancer in father 11/04/2022   Hematuria, gross 11/01/2022   Arthralgia 10/28/2022   Anxiety 06/10/2022   Generalized muscle ache 06/10/2022   Nephrolithiasis 04/05/2022   Lesion of left ear 11/05/2021   Tinnitus, bilateral 08/04/2021   Acute MI, 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HCC)    S/P partial colectomy 09/02/2020   Dysplastic colon polyp    Cigarette nicotine  dependence without complication 09/26/2017   Chronic right-sided low back pain with right-sided sciatica 09/26/2017   Mixed hyperlipidemia 09/26/2017   CAD (coronary artery disease) 05/03/2016   Tobacco abuse    Prediabetes    Gastroesophageal reflux disease    ACS (acute coronary syndrome) (HCC) 03/31/2016   Pelvic floor dysfunction 01/14/2016   Urinary hesitancy 12/21/2015    PCP: Edman Das FNP   REFERRING PROVIDER: Onesimo Anes LABOR, MD  REFERRING DIAG:  Diagnosis  M19.011 (ICD-10-CM) - Arthritis of right acromioclavicular joint  S46.811D (ICD-10-CM) - Partial tear of right subscapularis tendon, subsequent encounter    THERAPY DIAG:  Acute pain of right shoulder  Stiffness of right shoulder, not elsewhere classified  Muscle weakness (generalized)  Rationale for Evaluation and Treatment: Rehabilitation  ONSET DATE: s/p R shoulder SAD/DCE, rotator cuff repair, subscap repair 10/16/23  SUBJECTIVE:                                                                                                                                                                                      SUBJECTIVE STATEMENT:Shoulder is doing okay, pain gets worse in evenings after using it.    EVAL: Surgery was a little more than I thought it was gonna be. Shoulder feels like frozen gel. Its very stiff without much movement, just got out of the sling last Friday. Doctor gave me pendulums and  that's about it. Had some odd complications after a trigger finger surgery last year, had to see a lot of rheumatologists to  r/o autoimmune disease and recover overall.    Hand dominance: Left  PERTINENT HISTORY: See above   PAIN:  Are you having pain? Yes: NPRS scale: 3/10 Pain location: R shoulder  Pain description: tightness  Aggravating factors: exercises/moving shoulder in general  Relieving factors: ice and heat   PRECAUTIONS: Shoulder  RED FLAGS: None   WEIGHT BEARING RESTRICTIONS: No  FALLS:  Has patient fallen in last 6 months? 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   PATIENT SURVEYS:  PSFS: THE PATIENT SPECIFIC FUNCTIONAL SCALE  Place score of 0-10 (0 = unable to perform activity and 10 = able to perform activity at the same level as before injury or problem)  Activity Date: 11/30/23    Working as an personnel officer  0    2. Reaching [place]  0    3. Putting on belt  5    4.      Total Score 1.7      Total Score = Sum of activity scores/number of activities  Minimally Detectable Change: 3 points (for single activity); 2 points (for average score)  [facility] (nd). The Patient Specific Functional Scale . Retrieved from [email or web address]   COGNITION: Overall cognitive status: Within functional limits for tasks  assessed       POSTURE: Forward head, rounded shoulders   UPPER EXTREMITY ROM:    ROM Right eval Right 12/07/23  Shoulder flexion PROM 110-120* pain, AROM supine 100-110* pain PROM 150*  Shoulder extension    Shoulder scaption  PROM 110-120*,  PROM 150*  Shoulder adduction    Shoulder internal rotation Hand to belly at 0* ABD    Shoulder external rotation 0-10* at 0* ABD supine PROM; able to get to neutral AROM At 0* ABD, 20-30* PROM  Elbow flexion    Elbow extension    Wrist flexion    Wrist extension    Wrist ulnar deviation    Wrist radial deviation    Wrist pronation    Wrist supination    (Blank rows = not tested)  UPPER EXTREMITY MMT:  MMT Right eval Left eval  Shoulder flexion    Shoulder extension    Shoulder abduction    Shoulder adduction    Shoulder internal rotation    Shoulder external rotation    Middle trapezius    Lower trapezius    Elbow flexion    Elbow extension    Wrist flexion    Wrist extension    Wrist ulnar deviation    Wrist radial deviation    Wrist pronation    Wrist supination    Grip strength (lbs)    (Blank rows = not tested)  DNT MMT at eval due to pain with basic ROM at eval, functional MMT for flexion and ER approximately 3/5 based on AROM testing    PALPATION:   TTP anterior shoulder  TREATMENT DATE:  12/12/23 Pulleys 2 min flexion, 2 min abduction, AAROM Pball roll up wall for flexion AAROM 5 sec X 10 Seated shoulder flexion AAROM up, then no assist for eccentric lower, X 10 rep Seated shoulder aduction AAROM up, then no assist for eccentric lower, X 10 rep Shoulder ROM/stretching as per protocol, GH oscillations  Seated ER AAROM with cane x15 to 45* only per protocol  Pulleys for shoulder elevation AAROM x15 Isometric 5 sec X 10 shoulder flexion, ER, IR.   Manual therapy: right shoulder PROM  to tolerance flexion, abd, and then held ER to 45 deg.  12/07/23  Shoulder ROM/stretching as per protocol, GH oscillations   Supine flexion AAROM 15x5 seconds  Seated ER AAROM with cane x15 to 45* only per protocol  Pulleys for shoulder elevation AAROM x15 Door slides for flexion with pillowcase x12  Isometric shoulder flexion x10   Education on ongoing precautions and progressions in PT       PATIENT EDUCATION: Education details: exam findings, POC, HEP, post-op limitations from subscap repair (most conservative part of his surgery/slowest recovering) Person educated: Patient Education method: Explanation, Demonstration, Verbal cues, and Handouts Education comprehension: verbalized understanding, returned demonstration, and needs further education  HOME EXERCISE PROGRAM:   Access Code: [email or web address] URL: [email or web address] Date: 11/30/2023 Prepared by: Josette Rough  Exercises - Supine Shoulder Flexion Extension AAROM with Dowel  - 2-3 x daily - 7 x weekly - 1 sets - 10 reps - 3 seconds  hold - Supine Shoulder Press AAROM in Abduction with Dowel  - 2-3 x daily - 7 x weekly - 1 sets - 10 reps - Seated Shoulder External Rotation AAROM with Cane and Hand in Neutral  - 2-3 x daily - 7 x weekly - 1 sets - 10 reps - 3 seconds  hold - Shoulder Flexion Wall Slide with Towel  - 2-3 x daily - 7 x weekly - 1 sets - 10 reps - 3 seconds  hold - Seated Scapular Retraction  - 2-3 x daily - 7 x weekly - 1 sets - 10 reps - Seated Backward Shoulder Rolls  - 2-3 x daily - 7 x weekly - 1 sets - 10 reps  ASSESSMENT:  CLINICAL IMPRESSION:.He is 8 weeks post op now so progressed his program some as recommended by surgical protocol. He had good tolerance to this within session today. Cautioned him about overdoing things but he can progress as tolerated.    EVAL: Patient is a 53 y.o. M who was seen today for physical therapy evaluation and treatment for  Diagnosis  M19.011  (ICD-10-CM) - Arthritis of right acromioclavicular joint  S46.811D (ICD-10-CM) - Partial tear of right subscapularis tendon, subsequent encounter  . He received surgery as noted above on 10/16/23, also had subscapularis repair which at this point remains the most limiting part of his protocol. MD did not send a formal post-op protocol so used subscap repair protocol from [employer]. Will challenge him as appropriate and able as per protocol and postop restrictions.   OBJECTIVE IMPAIRMENTS: decreased mobility, decreased ROM, decreased strength, increased fascial restrictions, increased muscle spasms, impaired UE functional use, improper body mechanics, postural dysfunction, obesity, and pain.   ACTIVITY LIMITATIONS: carrying, lifting, continence, bathing, toileting, dressing, reach over head, hygiene/grooming, and caring for others  PARTICIPATION LIMITATIONS: meal prep, cleaning, laundry, driving, shopping, community activity, occupation, and yard work  PERSONAL FACTORS: Age, Behavior pattern, Education, Fitness, Past/current experiences, Profession, Social background, and Time since onset of  injury/illness/exacerbation are also affecting patient's functional outcome.   REHAB POTENTIAL: Good  CLINICAL DECISION MAKING: Stable/uncomplicated  EVALUATION COMPLEXITY: Low   GOALS: Goals reviewed with patient? No  SHORT TERM GOALS: Target date: 12/28/2023    Patient will be compliant with appropriate progressive HEP        GOAL STATUS: Initial  2. R  shoulder flexion and scaption AROM to be at least 150*         GOAL STATUS: Initial    3. R shoulder ER and IR A/PROM to be at least 45* at 45* ABD           GOAL STATUS: Initial    4. Will be more aware of posture with all functional tasks with use of ergonomic aides PRN/as desired      GOAL STATUS: Initial    LONG TERM GOALS: Target date: 01/25/2024    MMT to have improved by at least one grade in all weak groups       GOAL  STATUS: Initial    2. AROM to have normalized and will be pain free all planes of motion      GOAL STATUS: Initial    3. Pain to be no more than 2/10 with all functional tasks     GOAL STATUS: Initial   4. Will be able to perform all functional household and work based tasks without increase from resting pain levels including [place] work for his job as an personnel officer      GOAL STATUS: Initial   5. PSFS  to have improved by at least 3 points to show improved QOL and subjective perception of condition      GOAL STATUS: Initial    PLAN:  PT FREQUENCY: 2x/week  PT DURATION: 8 weeks  PLANNED INTERVENTIONS: [id number]- PT Re-evaluation, 97750- Physical Performance Testing, 97110-Therapeutic exercises, 97530- Therapeutic activity, [id number]- Neuromuscular re-education, 97535- Self Care, [id number]- Manual therapy, [id number]- Electrical stimulation (manual), [id number]- Vasopneumatic device, [id number]- Ultrasound, [id number]- Ionotophoresis 4mg /ml Dexamethasone , and Patient/Family education  PLAN FOR NEXT SESSION:  per protocol- 8 weeks s/p surgery 12/04/23; subscap repair is most conservative portion of his surgery so using protcol below:   [email or web address]  Keep head elevated in supine, no bending forward- eye needs to be above waist per pt  Redell Moose, PT, DPT 12/12/23 2:16 PM

## 2023-12-18 ENCOUNTER — Ambulatory Visit: Admitting: Physical Therapy

## 2023-12-20 ENCOUNTER — Encounter: Payer: Self-pay | Admitting: Physical Therapy

## 2023-12-20 ENCOUNTER — Ambulatory Visit: Admitting: Physical Therapy

## 2023-12-20 DIAGNOSIS — M25611 Stiffness of right shoulder, not elsewhere classified: Secondary | ICD-10-CM

## 2023-12-20 DIAGNOSIS — M25511 Pain in right shoulder: Secondary | ICD-10-CM

## 2023-12-20 DIAGNOSIS — M6281 Muscle weakness (generalized): Secondary | ICD-10-CM

## 2023-12-20 NOTE — Therapy (Signed)
 OUTPATIENT PHYSICAL THERAPY SHOULDER TREATMENT    Patient Name: Derrick Turner MRN: [id number] DOB:December 11, 1970, 53 y.o., male Today's Date: 12/20/2023  END OF SESSION:  PT End of Session - 12/20/23 1114     Visit Number 4    Number of Visits 17    Date for Recertification  01/25/24    Authorization Type [employer]    PT Start Time 1100    PT Stop Time 1140    PT Time Calculation (min) 40 min    Activity Tolerance Patient tolerated treatment well    Behavior During Therapy [facility] for tasks assessed/performed           Past Medical History:  Diagnosis Date   Arthritis    Coronary artery disease cardiologist--- dr croitoru   ETT 04-01-2016 in epic, reproduced chest pain but no EKG changes;  cardiac cath 04-06-2016 moderate diffuse nonobstructive disease, aggressive medical management   High cholesterol    History of 2019 novel coronavirus disease (COVID-19) 03/02/2020   per pt tested positive covid, copy of result in epic, stated mild symptoms that resolved   History of kidney stones    Incomplete right bundle branch block    Myocardial infarction [facility])    Right ureteral calculus    Wears glasses    Past Surgical History:  Procedure Laterality Date   APPENDECTOMY  teen   BACK SURGERY     two   BIOPSY  08/11/2020   Procedure: BIOPSY;  Surgeon: Cindie Carlin POUR, DO;  Location: [facility];  Service: Endoscopy;;   CATARACT EXTRACTION W/PHACO Left 12/01/2023   Procedure: PHACOEMULSIFICATION, CATARACT, WITH IOL INSERTION;  Surgeon: Juli Blunt, MD;  Location: [facility];  Service: Ophthalmology;  Laterality: Left;  CDE: 7.56   COLONOSCOPY WITH PROPOFOL  N/A 08/11/2020   Procedure: COLONOSCOPY WITH PROPOFOL ;  Surgeon: Cindie Carlin POUR, DO;  Location: [facility];  Service: Endoscopy;  Laterality: N/A;  7:30am   CORONARY/GRAFT ACUTE MI REVASCULARIZATION N/A 07/16/2021   Procedure: Coronary/Graft Acute MI Revascularization;  Surgeon: Verlin Lonni BIRCH, MD;  Location:  [facility];  Service: Cardiovascular;  Laterality: N/A;   CYSTOSCOPY WITH RETROGRADE PYELOGRAM, URETEROSCOPY AND STENT PLACEMENT Right 04/14/2020   Procedure: CYSTOSCOPY WITH RETROGRADE PYELOGRAM, URETEROSCOPY AND STENT PLACEMENT;  Surgeon: Rosalind Zachary NOVAK, MD;  Location: [facility];  Service: Urology;  Laterality: Right;   EXTRACORPOREAL SHOCK WAVE LITHOTRIPSY Right 03/23/2020   Procedure: EXTRACORPOREAL SHOCK WAVE LITHOTRIPSY (ESWL);  Surgeon: Carolee Sherwood BIRCH DOUGLAS, MD;  Location: [facility];  Service: Urology;  Laterality: Right;   HOLMIUM LASER APPLICATION Right 04/14/2020   Procedure: HOLMIUM LASER APPLICATION;  Surgeon: Rosalind Zachary NOVAK, MD;  Location: [facility];  Service: Urology;  Laterality: Right;   LAPAROSCOPIC CHOLECYSTECTOMY  2012 approx   LEFT HEART CATH AND CORONARY ANGIOGRAPHY N/A 04/06/2016   Procedure: Left Heart Cath and Coronary Angiography;  Surgeon: Ozell Fell, MD;  Location: [facility] INVASIVE CV LAB;  Service: Cardiovascular;  Laterality: N/A;   LEFT HEART CATH AND CORONARY ANGIOGRAPHY N/A 07/16/2021   Procedure: LEFT HEART CATH AND CORONARY ANGIOGRAPHY;  Surgeon: Verlin Lonni BIRCH, MD;  Location: [facility];  Service: Cardiovascular;  Laterality: N/A;   OPEN SUBSCAPULARIS REPAIR Right 10/16/2023   Procedure: REPAIR, SHOULDER, SUBSCAPULARIS;  Surgeon: Onesimo Oneil LABOR, MD;  Location: [facility];  Service: Orthopedics;  Laterality: Right;   ORCHIECTOMY Right 1990s   per pt benign cyst   PARTIAL COLECTOMY Right 09/02/2020  Procedure: HEMICOLECTOMY;  Surgeon: Mavis Anes, MD;  Location: [facility];  Service: General;  Laterality: Right;   POLYPECTOMY  08/11/2020   Procedure: POLYPECTOMY;  Surgeon: Cindie Carlin POUR, DO;  [facility];  Service: Endoscopy;;   SHOULDER ARTHROSCOPY Right 10/16/2023   Procedure: ARTHROSCOPY, SHOULDER;  Surgeon: Onesimo Anes LABOR, MD;  Location: [facility];  Service: Orthopedics;   Laterality: Right;  Distal clavicle excision   SHOULDER SURGERY Bilateral left 2019;  right 2016   arthroscopy   SHOULDER SURGERY Left    SHOULDER SURGERY Right    Patient Active Problem List   Diagnosis Date Noted   Diarrhea 06/30/2023   Elevated BP without diagnosis of hypertension 02/28/2023   Right hand pain 01/27/2023   Family history of prostate cancer in father 11/04/2022   Hematuria, gross 11/01/2022   Arthralgia 10/28/2022   Anxiety 06/10/2022   Generalized muscle ache 06/10/2022   Nephrolithiasis 04/05/2022   Lesion of left ear 11/05/2021   Tinnitus, bilateral 08/04/2021   Acute MI, 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HCC)    S/P partial colectomy 09/02/2020   Dysplastic colon polyp    Cigarette nicotine  dependence without complication 09/26/2017   Chronic right-sided low back pain with right-sided sciatica 09/26/2017   Mixed hyperlipidemia 09/26/2017   CAD (coronary artery disease) 05/03/2016   Tobacco abuse    Prediabetes    Gastroesophageal reflux disease    ACS (acute coronary syndrome) (HCC) 03/31/2016   Pelvic floor dysfunction 01/14/2016   Urinary hesitancy 12/21/2015    PCP: Edman Das FNP   REFERRING PROVIDER: Onesimo Anes LABOR, MD  REFERRING DIAG:  Diagnosis  M19.011 (ICD-10-CM) - Arthritis of right acromioclavicular joint  S46.811D (ICD-10-CM) - Partial tear of right subscapularis tendon, subsequent encounter    THERAPY DIAG:  Acute pain of right shoulder  Stiffness of right shoulder, not elsewhere classified  Muscle weakness (generalized)  Rationale for Evaluation and Treatment: Rehabilitation  ONSET DATE: s/p R shoulder SAD/DCE, rotator cuff repair, subscap repair 10/16/23  SUBJECTIVE:                                                                                                                                                                                      SUBJECTIVE STATEMENT:Shoulder is doing good, not having much pain   EVAL: Surgery was a  little more than I thought it was gonna be. Shoulder feels like frozen gel. Its very stiff without much movement, just got out of the sling last Friday. Doctor gave me pendulums and  that's about it. Had some odd complications after a trigger finger surgery last year, had to see a lot of rheumatologists to r/o autoimmune disease and  recover overall.    Hand dominance: Left  PERTINENT HISTORY: See above   PAIN:  Are you having pain? Yes: NPRS scale: 3/10 Pain location: R shoulder  Pain description: tightness  Aggravating factors: exercises/moving shoulder in general  Relieving factors: ice and heat   PRECAUTIONS: Shoulder  RED FLAGS: None   WEIGHT BEARING RESTRICTIONS: No  FALLS:  Has patient fallen in last 6 months? 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   PATIENT SURVEYS:  PSFS: THE PATIENT SPECIFIC FUNCTIONAL SCALE  Place score of 0-10 (0 = unable to perform activity and 10 = able to perform activity at the same level as before injury or problem)  Activity Date: 11/30/23    Working as an personnel officer  0    2. Reaching [place]  0    3. Putting on belt  5    4.      Total Score 1.7      Total Score = Sum of activity scores/number of activities  Minimally Detectable Change: 3 points (for single activity); 2 points (for average score)  [facility] (nd). The Patient Specific Functional Scale . Retrieved from [email or web address]   COGNITION: Overall cognitive status: Within functional limits for tasks  assessed       POSTURE: Forward head, rounded shoulders   UPPER EXTREMITY ROM:    ROM Right eval Right 12/07/23 Right 12/20/23  Shoulder flexion PROM 110-120* pain, AROM supine 100-110* pain PROM 150* PROM 170  Shoulder extension     Shoulder scaption  PROM 110-120*,  PROM 150* PROM 160  Shoulder adduction     Shoulder internal rotation Hand to belly at 0* ABD     Shoulder external rotation 0-10* at 0* ABD supine PROM; able to get to neutral AROM At 0* ABD, 20-30* PROM   Elbow flexion     Elbow extension     Wrist flexion     Wrist extension     Wrist ulnar deviation     Wrist radial deviation     Wrist pronation     Wrist supination     (Blank rows = not tested)  UPPER EXTREMITY MMT:  MMT Right eval Left eval  Shoulder flexion    Shoulder extension    Shoulder abduction    Shoulder adduction    Shoulder internal rotation    Shoulder external rotation    Middle trapezius    Lower trapezius    Elbow flexion    Elbow extension    Wrist flexion    Wrist extension    Wrist ulnar deviation    Wrist radial deviation    Wrist pronation    Wrist supination    Grip strength (lbs)    (Blank rows = not tested)  DNT MMT at eval due to pain with basic ROM at eval, functional MMT for flexion and ER approximately 3/5 based on AROM testing    PALPATION:   TTP anterior shoulder  TREATMENT DATE:  12/20/23 Pulleys 2 min flexion, 2 min abduction, AAROM Isometric 5 sec X 10 shoulder flexion, ER, IR. abd UE ranger AAROM X 10 reps flexion, X 10 reps scaption, X 10 reps circles CW and CCW in flexion UBE L1 X 2 min pushing, 2 min pulling Standing shoulder flexion AROM X 10 reps  Seated shoulder aduction AROM X 10 reps   12/12/23 Pulleys 2 min flexion, 2 min abduction, AAROM Pball roll up wall for flexion AAROM 5 sec X 10 Seated shoulder flexion  AAROM up, then no assist for eccentric lower, X 10 rep Seated shoulder aduction AAROM up, then no assist for eccentric lower, X 10 rep Shoulder ROM/stretching as per protocol, GH oscillations  Seated ER AAROM with cane x15 to 45* only per protocol  Pulleys for shoulder elevation AAROM x15 Isometric 5 sec X 10 shoulder flexion, ER, IR.   Manual therapy: right shoulder PROM to tolerance flexion, abd, and then held ER to 45 deg.  12/07/23  Shoulder ROM/stretching as per protocol, GH oscillations   Supine flexion AAROM 15x5 seconds  Seated ER AAROM with cane x15 to 45* only per protocol  Pulleys for shoulder elevation AAROM x15 Door slides for flexion with pillowcase x12  Isometric shoulder flexion x10   Education on ongoing precautions and progressions in PT       PATIENT EDUCATION: Education details: exam findings, POC, HEP, post-op limitations from subscap repair (most conservative part of his surgery/slowest recovering) Person educated: Patient Education method: Explanation, Demonstration, Verbal cues, and Handouts Education comprehension: verbalized understanding, returned demonstration, and needs further education  HOME EXERCISE PROGRAM:   Access Code: [email or web address] URL: [email or web address] Date: 11/30/2023 Prepared by: Josette Rough  Exercises - Supine Shoulder Flexion Extension AAROM with Dowel  - 2-3 x daily - 7 x weekly - 1 sets - 10 reps - 3 seconds  hold - Supine Shoulder Press AAROM in Abduction with Dowel  - 2-3 x daily - 7 x weekly - 1 sets - 10 reps - Seated Shoulder External Rotation AAROM with Cane and Hand in Neutral  - 2-3 x daily - 7 x weekly - 1 sets - 10 reps - 3 seconds  hold - Shoulder Flexion Wall Slide with Towel  - 2-3 x daily - 7 x weekly - 1 sets - 10 reps - 3 seconds  hold - Seated Scapular Retraction  - 2-3 x daily - 7 x weekly - 1 sets - 10 reps - Seated Backward Shoulder Rolls  - 2-3 x daily - 7 x weekly - 1 sets - 10  reps  ASSESSMENT:  CLINICAL IMPRESSION:.He is making good progress overall and now has basically full PROM in his right shoulder. I progressed him to AROM today and we will shift towards more strength work now as tolerated now.    EVAL: Patient is a 53 y.o. M who was seen today for physical therapy evaluation and treatment for  Diagnosis  M19.011 (ICD-10-CM) - Arthritis of right acromioclavicular joint  S46.811D (ICD-10-CM) - Partial tear of right subscapularis tendon, subsequent encounter  . He received surgery as noted above on 10/16/23, also had subscapularis repair which at this point remains the most limiting part of his protocol. MD did not send a formal post-op protocol so used subscap repair protocol from [employer]. Will challenge him as appropriate and able as per protocol and postop restrictions.   OBJECTIVE IMPAIRMENTS: decreased mobility, decreased ROM, decreased strength, increased fascial restrictions, increased muscle spasms, impaired  UE functional use, improper body mechanics, postural dysfunction, obesity, and pain.   ACTIVITY LIMITATIONS: carrying, lifting, continence, bathing, toileting, dressing, reach over head, hygiene/grooming, and caring for others  PARTICIPATION LIMITATIONS: meal prep, cleaning, laundry, driving, shopping, community activity, occupation, and yard work  PERSONAL FACTORS: Age, Behavior pattern, Education, Fitness, Past/current experiences, Profession, Social background, and Time since onset of injury/illness/exacerbation are also affecting patient's functional outcome.   REHAB POTENTIAL: Good  CLINICAL DECISION MAKING: Stable/uncomplicated  EVALUATION COMPLEXITY: Low   GOALS: Goals reviewed with patient? No  SHORT TERM GOALS: Target date: 12/28/2023    Patient will be compliant with appropriate progressive HEP        GOAL STATUS: Initial  2. R  shoulder flexion and scaption AROM to be at least 150*         GOAL STATUS: Initial     3. R shoulder ER and IR A/PROM to be at least 45* at 45* ABD           GOAL STATUS: Initial    4. Will be more aware of posture with all functional tasks with use of ergonomic aides PRN/as desired      GOAL STATUS: Initial    LONG TERM GOALS: Target date: 01/25/2024    MMT to have improved by at least one grade in all weak groups       GOAL STATUS: Initial    2. AROM to have normalized and will be pain free all planes of motion      GOAL STATUS: Initial    3. Pain to be no more than 2/10 with all functional tasks     GOAL STATUS: Initial   4. Will be able to perform all functional household and work based tasks without increase from resting pain levels including [place] work for his job as an personnel officer      GOAL STATUS: Initial   5. PSFS  to have improved by at least 3 points to show improved QOL and subjective perception of condition      GOAL STATUS: Initial    PLAN:  PT FREQUENCY: 2x/week  PT DURATION: 8 weeks  PLANNED INTERVENTIONS: 97164- PT Re-evaluation, 97750- Physical Performance Testing, 97110-Therapeutic exercises, 97530- Therapeutic activity, [id number]- Neuromuscular re-education, 97535- Self Care, [id number]- Manual therapy, [id number]- Electrical stimulation (manual), [id number]- Vasopneumatic device, [id number]- Ultrasound, 02966- Ionotophoresis 4mg /ml Dexamethasone , and Patient/Family education  PLAN FOR NEXT SESSION:   Add more band work and strength as tolerated per protocol below per protocol- 8 weeks s/p surgery 12/04/23; subscap repair is most conservative portion of his surgery so using protcol below:   [email or web address]  Keep head elevated in supine, no bending forward- eye needs to be above waist per pt  Redell Moose, PT, DPT 12/20/23 11:51 AM

## 2023-12-25 ENCOUNTER — Ambulatory Visit: Admitting: Physical Therapy

## 2023-12-25 ENCOUNTER — Encounter: Payer: Self-pay | Admitting: Physical Therapy

## 2023-12-25 DIAGNOSIS — M25611 Stiffness of right shoulder, not elsewhere classified: Secondary | ICD-10-CM

## 2023-12-25 DIAGNOSIS — M25511 Pain in right shoulder: Secondary | ICD-10-CM

## 2023-12-25 DIAGNOSIS — R29898 Other symptoms and signs involving the musculoskeletal system: Secondary | ICD-10-CM

## 2023-12-25 DIAGNOSIS — M6281 Muscle weakness (generalized): Secondary | ICD-10-CM

## 2023-12-25 NOTE — Therapy (Signed)
 OUTPATIENT PHYSICAL THERAPY SHOULDER TREATMENT    Patient Name: Derrick Turner MRN: [id number] DOB:[birth year], 53 y.o., male Today's Date: 12/25/2023  END OF SESSION:  PT End of Session - 12/25/23 1042     Visit Number 5    Number of Visits 17    Date for Recertification  01/25/24    Authorization Type [employer]    PT Start Time 1016    PT Stop Time 1055    PT Time Calculation (min) 39 min    Activity Tolerance Patient tolerated treatment well    Behavior During Therapy [facility] for tasks assessed/performed            Past Medical History:  Diagnosis Date   Arthritis    Coronary artery disease cardiologist--- dr croitoru   ETT 04-01-2016 in epic, reproduced chest pain but no EKG changes;  cardiac cath 04-06-2016 moderate diffuse nonobstructive disease, aggressive medical management   High cholesterol    History of 2019 novel coronavirus disease (COVID-19) 03/02/2020   per pt tested positive covid, copy of result in epic, stated mild symptoms that resolved   History of kidney stones    Incomplete right bundle branch block    Myocardial infarction [facility])    Right ureteral calculus    Wears glasses    Past Surgical History:  Procedure Laterality Date   APPENDECTOMY  teen   BACK SURGERY     two   BIOPSY  08/11/2020   Procedure: BIOPSY;  Surgeon: Cindie Carlin POUR, DO;  Location: [facility];  Service: Endoscopy;;   CATARACT EXTRACTION W/PHACO Left 12/01/2023   Procedure: PHACOEMULSIFICATION, CATARACT, WITH IOL INSERTION;  Surgeon: Juli Blunt, MD;  Location: [facility];  Service: Ophthalmology;  Laterality: Left;  CDE: 7.56   COLONOSCOPY WITH PROPOFOL  N/A 08/11/2020   Procedure: COLONOSCOPY WITH PROPOFOL ;  Surgeon: Cindie Carlin POUR, DO;  [facility]: [facility];  Service: Endoscopy;  Laterality: N/A;  7:30am   CORONARY/GRAFT ACUTE MI REVASCULARIZATION N/A 07/16/2021   Procedure: Coronary/Graft Acute MI Revascularization;  Surgeon: Verlin Lonni BIRCH, MD;  Location:  [facility];  Service: Cardiovascular;  Laterality: N/A;   CYSTOSCOPY WITH RETROGRADE PYELOGRAM, URETEROSCOPY AND STENT PLACEMENT Right 04/14/2020   Procedure: CYSTOSCOPY WITH RETROGRADE PYELOGRAM, URETEROSCOPY AND STENT PLACEMENT;  Surgeon: Rosalind Zachary NOVAK, MD;  Location: [facility];  Service: Urology;  Laterality: Right;   EXTRACORPOREAL SHOCK WAVE LITHOTRIPSY Right 03/23/2020   Procedure: EXTRACORPOREAL SHOCK WAVE LITHOTRIPSY (ESWL);  Surgeon: Carolee Sherwood BIRCH DOUGLAS, MD;  Location: [facility];  Service: Urology;  Laterality: Right;   HOLMIUM LASER APPLICATION Right 04/14/2020   Procedure: HOLMIUM LASER APPLICATION;  Surgeon: Rosalind Zachary NOVAK, MD;  Location: [facility];  Service: Urology;  Laterality: Right;   LAPAROSCOPIC CHOLECYSTECTOMY  2012 approx   LEFT HEART CATH AND CORONARY ANGIOGRAPHY N/A 04/06/2016   Procedure: Left Heart Cath and Coronary Angiography;  Surgeon: Ozell Fell, MD;  Location: [facility] INVASIVE CV LAB;  Service: Cardiovascular;  Laterality: N/A;   LEFT HEART CATH AND CORONARY ANGIOGRAPHY N/A 07/16/2021   Procedure: LEFT HEART CATH AND CORONARY ANGIOGRAPHY;  Surgeon: Verlin Lonni BIRCH, MD;  Location: [facility];  Service: Cardiovascular;  Laterality: N/A;   OPEN SUBSCAPULARIS REPAIR Right 10/16/2023   Procedure: REPAIR, SHOULDER, SUBSCAPULARIS;  Surgeon: Onesimo Oneil LABOR, MD;  Location: [facility];  Service: Orthopedics;  Laterality: Right;   ORCHIECTOMY Right 1990s   per pt benign cyst   PARTIAL COLECTOMY Right 09/02/2020  Procedure: HEMICOLECTOMY;  Surgeon: Mavis Anes, MD;  Location: [facility];  Service: General;  Laterality: Right;   POLYPECTOMY  08/11/2020   Procedure: POLYPECTOMY;  Surgeon: Cindie Carlin POUR, DO;  [facility];  Service: Endoscopy;;   SHOULDER ARTHROSCOPY Right 10/16/2023   Procedure: ARTHROSCOPY, SHOULDER;  Surgeon: Onesimo Anes LABOR, MD;  Location: [facility];  Service: Orthopedics;   Laterality: Right;  Distal clavicle excision   SHOULDER SURGERY Bilateral left 2019;  right 2016   arthroscopy   SHOULDER SURGERY Left    SHOULDER SURGERY Right    Patient Active Problem List   Diagnosis Date Noted   Diarrhea 06/30/2023   Elevated BP without diagnosis of hypertension 02/28/2023   Right hand pain 01/27/2023   Family history of prostate cancer in father 11/04/2022   Hematuria, gross 11/01/2022   Arthralgia 10/28/2022   Anxiety 06/10/2022   Generalized muscle ache 06/10/2022   Nephrolithiasis 04/05/2022   Lesion of left ear 11/05/2021   Tinnitus, bilateral 08/04/2021   Acute MI, 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HCC)    S/P partial colectomy 09/02/2020   Dysplastic colon polyp    Cigarette nicotine  dependence without complication 09/26/2017   Chronic right-sided low back pain with right-sided sciatica 09/26/2017   Mixed hyperlipidemia 09/26/2017   CAD (coronary artery disease) 05/03/2016   Tobacco abuse    Prediabetes    Gastroesophageal reflux disease    ACS (acute coronary syndrome) (HCC) 03/31/2016   Pelvic floor dysfunction 01/14/2016   Urinary hesitancy 12/21/2015    PCP: Edman Das FNP   REFERRING PROVIDER: Onesimo Anes LABOR, MD  REFERRING DIAG:  Diagnosis  M19.011 (ICD-10-CM) - Arthritis of right acromioclavicular joint  S46.811D (ICD-10-CM) - Partial tear of right subscapularis tendon, subsequent encounter    THERAPY DIAG:  Acute pain of right shoulder  Stiffness of right shoulder, not elsewhere classified  Muscle weakness (generalized)  Other symptoms and signs involving the musculoskeletal system  Rationale for Evaluation and Treatment: Rehabilitation  ONSET DATE: s/p R shoulder SAD/DCE, rotator cuff repair, subscap repair 10/16/23  SUBJECTIVE:                                                                                                                                                                                      SUBJECTIVE  STATEMENT:   Not much new since last time, hadn't felt well the past few days so didn't do much of HEP but got better yesterday. I can tell when I don't do my exercises, shoulder stiffens up pretty quick.    EVAL: Surgery was a little more than I thought it was gonna be. Shoulder feels like frozen gel. Its very stiff without  much movement, just got out of the sling last Friday. Doctor gave me pendulums and  that's about it. Had some odd complications after a trigger finger surgery last year, had to see a lot of rheumatologists to r/o autoimmune disease and recover overall.    Hand dominance: Left  PERTINENT HISTORY: See above   PAIN:  Are you having pain? No 0/10, at worst 4/10 in past week   PRECAUTIONS: Shoulder  RED FLAGS: None   WEIGHT BEARING RESTRICTIONS: No  FALLS:  Has patient fallen in last 6 months? 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   PATIENT SURVEYS:  PSFS: THE PATIENT SPECIFIC FUNCTIONAL SCALE  Place score of 0-10 (0 = unable to perform activity and 10 = able to perform activity at the same level as before injury or problem)  Activity Date: 11/30/23 12/25/23   Working as an personnel officer  0 2   2. Reaching [place]  0 3   3. Putting on belt  5 9   4.      Total Score 1.7 4.7     Total Score = Sum of activity scores/number of activities  Minimally Detectable Change: 3 points (for single activity); 2 points (for average score)  [facility] (nd). The Patient Specific Functional Scale . Retrieved from  [email or web address]   COGNITION: Overall cognitive status: Within functional limits for tasks assessed       POSTURE: Forward head, rounded shoulders   UPPER EXTREMITY ROM:    ROM Right eval Right 12/07/23 Right 12/20/23 Right 12/25/23 supine   Shoulder flexion PROM 110-120* pain, AROM supine 100-110* pain PROM 150* PROM 170 AROM 145*  Shoulder extension      Shoulder scaption  PROM 110-120*,  PROM 150* PROM 160 AROM 138*  Shoulder adduction      Shoulder internal rotation Hand to belly at 0* ABD    At about 45* ABD, approximately 70* AROM   Shoulder external rotation 0-10* at 0* ABD supine PROM; able to get to neutral AROM At 0* ABD, 20-30* PROM  At about 45* ABD, approximately 60* AROM  Elbow flexion      Elbow extension      Wrist flexion      Wrist extension      Wrist ulnar deviation      Wrist radial deviation      Wrist pronation      Wrist supination      (Blank rows = not tested)  UPPER EXTREMITY MMT:  MMT Right eval Left eval Right 12/25/23  Shoulder flexion   3  Shoulder extension     Shoulder abduction   3  Shoulder adduction     Shoulder internal rotation   4  Shoulder external rotation   4  Middle trapezius     Lower trapezius     Elbow flexion     Elbow extension     Wrist flexion     Wrist extension     Wrist ulnar deviation     Wrist radial deviation     Wrist pronation     Wrist supination  Grip strength (lbs)     (Blank rows = not tested)  DNT MMT at eval due to pain with basic ROM at eval, functional MMT for flexion and ER approximately 3/5 based on AROM testing    PALPATION:   TTP anterior shoulder                                                                                                                              TREATMENT DATE:   12/25/23  UBE L2x4 minutes forward, 4 minutes backward for w/u Shoulder PROM/stretching all directions as appropriate   PSFS, ROM, MMT,  goals   Standing shoulder flexion 0# 2x10 R Standing shoulder scaption 0# 2x10 R Scapular retractions red TB x12 Shoulder extensions red TB x12 Serratus punches supine 3# x12  Serratus punch alphabet x2 supine 3#    12/20/23 Pulleys 2 min flexion, 2 min abduction, AAROM Isometric 5 sec X 10 shoulder flexion, ER, IR. abd UE ranger AAROM X 10 reps flexion, X 10 reps scaption, X 10 reps circles CW and CCW in flexion UBE L1 X 2 min pushing, 2 min pulling Standing shoulder flexion AROM X 10 reps  Seated shoulder aduction AROM X 10 reps   12/12/23 Pulleys 2 min flexion, 2 min abduction, AAROM Pball roll up wall for flexion AAROM 5 sec X 10 Seated shoulder flexion AAROM up, then no assist for eccentric lower, X 10 rep Seated shoulder aduction AAROM up, then no assist for eccentric lower, X 10 rep Shoulder ROM/stretching as per protocol, GH oscillations  Seated ER AAROM with cane x15 to 45* only per protocol  Pulleys for shoulder elevation AAROM x15 Isometric 5 sec X 10 shoulder flexion, ER, IR.   Manual therapy: right shoulder PROM to tolerance flexion, abd, and then held ER to 45 deg.  12/07/23  Shoulder ROM/stretching as per protocol, GH oscillations   Supine flexion AAROM 15x5 seconds  Seated ER AAROM with cane x15 to 45* only per protocol  Pulleys for shoulder elevation AAROM x15 Door slides for flexion with pillowcase x12  Isometric shoulder flexion x10   Education on ongoing precautions and progressions in PT       PATIENT EDUCATION: Education details: exam findings, POC, HEP, post-op limitations from subscap repair (most conservative part of his surgery/slowest recovering) Person educated: Patient Education method: Explanation, Demonstration, Verbal cues, and Handouts Education comprehension: verbalized understanding, returned demonstration, and needs further education  HOME EXERCISE PROGRAM:   Access Code: [email or web address] URL:  [email or web address] Date: 11/30/2023 Prepared by: Josette Rough  Exercises - Supine Shoulder Flexion Extension AAROM with Dowel  - 2-3 x daily - 7 x weekly - 1 sets - 10 reps - 3 seconds  hold - Supine Shoulder Press AAROM in Abduction with Dowel  - 2-3 x daily - 7 x weekly - 1 sets - 10 reps - Seated Shoulder External Rotation AAROM with Cane and Hand in Neutral  - 2-3 x daily - 7  x weekly - 1 sets - 10 reps - 3 seconds  hold - Shoulder Flexion Wall Slide with Towel  - 2-3 x daily - 7 x weekly - 1 sets - 10 reps - 3 seconds  hold - Seated Scapular Retraction  - 2-3 x daily - 7 x weekly - 1 sets - 10 reps - Seated Backward Shoulder Rolls  - 2-3 x daily - 7 x weekly - 1 sets - 10 reps  ASSESSMENT:  CLINICAL IMPRESSION:   Arrives doing OK today, he was sick recently and didn't do too much, shoulder is feeling stiffer. Updated measures as above, otherwise continued progressing through protocol as appropriate. Making steady progress with PT, as noted in previous tx session and POC we will continue to push more into functional strengthening as appropriate per protocol.   EVAL: Patient is a 53 y.o. M who was seen today for physical therapy evaluation and treatment for  Diagnosis  M19.011 (ICD-10-CM) - Arthritis of right acromioclavicular joint  S46.811D (ICD-10-CM) - Partial tear of right subscapularis tendon, subsequent encounter  . He received surgery as noted above on 10/16/23, also had subscapularis repair which at this point remains the most limiting part of his protocol. MD did not send a formal post-op protocol so used subscap repair protocol from [employer]. Will challenge him as appropriate and able as per protocol and postop restrictions.   OBJECTIVE IMPAIRMENTS: decreased mobility, decreased ROM, decreased strength, increased fascial restrictions, increased muscle spasms, impaired UE functional use, improper body mechanics, postural dysfunction, obesity, and  pain.   ACTIVITY LIMITATIONS: carrying, lifting, continence, bathing, toileting, dressing, reach over head, hygiene/grooming, and caring for others  PARTICIPATION LIMITATIONS: meal prep, cleaning, laundry, driving, shopping, community activity, occupation, and yard work  PERSONAL FACTORS: Age, Behavior pattern, Education, Fitness, Past/current experiences, Profession, Social background, and Time since onset of injury/illness/exacerbation are also affecting patient's functional outcome.   REHAB POTENTIAL: Good  CLINICAL DECISION MAKING: Stable/uncomplicated  EVALUATION COMPLEXITY: Low   GOALS: Goals reviewed with patient? No  SHORT TERM GOALS: Target date: 12/28/2023    Patient will be compliant with appropriate progressive HEP        GOAL STATUS: MET 12/25/23  2. R  shoulder flexion and scaption AROM to be at least 150*         GOAL STATUS: ONGOING 12/25/23   3. R shoulder ER and IR A/PROM to be at least 45* at 45* ABD           GOAL STATUS: MET 12/25/23   4. Will be more aware of posture with all functional tasks with use of ergonomic aides PRN/as desired      GOAL STATUS: ONGOING 12/25/23   LONG TERM GOALS: Target date: 01/25/2024    MMT to have improved by at least one grade in all weak groups       GOAL STATUS: Initial    2. AROM to have normalized and will be pain free all planes of motion      GOAL STATUS: Initial    3. Pain to be no more than 2/10 with all functional tasks     GOAL STATUS: Initial   4. Will be able to perform all functional household and work based tasks without increase from resting pain levels including [place] work for his job as an personnel officer      GOAL STATUS: Initial   5. PSFS  to have improved by at least 3 points to show improved QOL and subjective  perception of condition      GOAL STATUS: Initial    PLAN:  PT FREQUENCY: 2x/week  PT DURATION: 8 weeks  PLANNED INTERVENTIONS: 97164- PT Re-evaluation, 97750- Physical Performance  Testing, 97110-Therapeutic exercises, 97530- Therapeutic activity, [id number]- Neuromuscular re-education, 97535- Self Care, 02859- Manual therapy, [id number]- Electrical stimulation (manual), [id number]- Vasopneumatic device, [id number]- Ultrasound, [id number]- Ionotophoresis 4mg /ml Dexamethasone , and Patient/Family education  PLAN FOR NEXT SESSION:   Add more band work and strength as tolerated per protocol below per protocol- 11 weeks s/p surgery 12/25/23; subscap repair is most conservative portion of his surgery so using protcol below:   [email or web address]  Keep head elevated in supine, no bending forward- eye needs to be above waist per pt; having cataract surgery on other eye 01/19/24  Josette Rough, PT, DPT 12/25/23 10:56 AM

## 2023-12-27 ENCOUNTER — Ambulatory Visit: Admitting: Physical Therapy

## 2023-12-27 ENCOUNTER — Encounter: Payer: Self-pay | Admitting: Physical Therapy

## 2023-12-27 DIAGNOSIS — M25611 Stiffness of right shoulder, not elsewhere classified: Secondary | ICD-10-CM

## 2023-12-27 DIAGNOSIS — M25511 Pain in right shoulder: Secondary | ICD-10-CM

## 2023-12-27 DIAGNOSIS — M6281 Muscle weakness (generalized): Secondary | ICD-10-CM

## 2023-12-27 NOTE — Therapy (Signed)
 OUTPATIENT PHYSICAL THERAPY SHOULDER TREATMENT    Patient Name: Derrick Turner MRN: [id number] DOB:05-17-1970, 53 y.o., male Today's Date: 12/27/2023  END OF SESSION:  PT End of Session - 12/27/23 1022     Visit Number 6    Number of Visits 17    Date for Recertification  01/25/24    Authorization Type [employer]    PT Start Time 1015    PT Stop Time 1053    PT Time Calculation (min) 38 min    Activity Tolerance Patient tolerated treatment well    Behavior During Therapy [facility] for tasks assessed/performed            Past Medical History:  Diagnosis Date   Arthritis    Coronary artery disease cardiologist--- dr croitoru   ETT 04-01-2016 in epic, reproduced chest pain but no EKG changes;  cardiac cath 04-06-2016 moderate diffuse nonobstructive disease, aggressive medical management   High cholesterol    History of 2019 novel coronavirus disease (COVID-19) 03/02/2020   per pt tested positive covid, copy of result in epic, stated mild symptoms that resolved   History of kidney stones    Incomplete right bundle branch block    Myocardial infarction [facility])    Right ureteral calculus    Wears glasses    Past Surgical History:  Procedure Laterality Date   APPENDECTOMY  teen   BACK SURGERY     two   BIOPSY  08/11/2020   Procedure: BIOPSY;  Surgeon: Cindie Carlin POUR, DO;  [facility]: [facility];  Service: Endoscopy;;   CATARACT EXTRACTION W/PHACO Left 12/01/2023   Procedure: PHACOEMULSIFICATION, CATARACT, WITH IOL INSERTION;  Surgeon: Juli Blunt, MD;  Location: [facility];  Service: Ophthalmology;  Laterality: Left;  CDE: 7.56   COLONOSCOPY WITH PROPOFOL  N/A 08/11/2020   Procedure: COLONOSCOPY WITH PROPOFOL ;  Surgeon: Cindie Carlin POUR, DO;  [facility]: [facility];  Service: Endoscopy;  Laterality: N/A;  7:30am   CORONARY/GRAFT ACUTE MI REVASCULARIZATION N/A 07/16/2021   Procedure: Coronary/Graft Acute MI Revascularization;  Surgeon: Verlin Lonni BIRCH, MD;  Location:  [facility];  Service: Cardiovascular;  Laterality: N/A;   CYSTOSCOPY WITH RETROGRADE PYELOGRAM, URETEROSCOPY AND STENT PLACEMENT Right 04/14/2020   Procedure: CYSTOSCOPY WITH RETROGRADE PYELOGRAM, URETEROSCOPY AND STENT PLACEMENT;  Surgeon: Rosalind Zachary NOVAK, MD;  Location: [facility];  Service: Urology;  Laterality: Right;   EXTRACORPOREAL SHOCK WAVE LITHOTRIPSY Right 03/23/2020   Procedure: EXTRACORPOREAL SHOCK WAVE LITHOTRIPSY (ESWL);  Surgeon: Carolee Sherwood BIRCH DOUGLAS, MD;  Location: [facility];  Service: Urology;  Laterality: Right;   HOLMIUM LASER APPLICATION Right 04/14/2020   Procedure: HOLMIUM LASER APPLICATION;  Surgeon: Rosalind Zachary NOVAK, MD;  Location: [facility];  Service: Urology;  Laterality: Right;   LAPAROSCOPIC CHOLECYSTECTOMY  2012 approx   LEFT HEART CATH AND CORONARY ANGIOGRAPHY N/A 04/06/2016   Procedure: Left Heart Cath and Coronary Angiography;  Surgeon: Ozell Fell, MD;  Location: [facility] INVASIVE CV LAB;  Service: Cardiovascular;  Laterality: N/A;   LEFT HEART CATH AND CORONARY ANGIOGRAPHY N/A 07/16/2021   Procedure: LEFT HEART CATH AND CORONARY ANGIOGRAPHY;  Surgeon: Verlin Lonni BIRCH, MD;  Location: [facility];  Service: Cardiovascular;  Laterality: N/A;   OPEN SUBSCAPULARIS REPAIR Right 10/16/2023   Procedure: REPAIR, SHOULDER, SUBSCAPULARIS;  Surgeon: Onesimo Oneil LABOR, MD;  Location: [facility];  Service: Orthopedics;  Laterality: Right;   ORCHIECTOMY Right 1990s   per pt benign cyst   PARTIAL COLECTOMY Right 09/02/2020  Procedure: HEMICOLECTOMY;  Surgeon: Mavis Anes, MD;  Location: [facility];  Service: General;  Laterality: Right;   POLYPECTOMY  08/11/2020   Procedure: POLYPECTOMY;  Surgeon: Cindie Carlin POUR, DO;  [facility];  Service: Endoscopy;;   SHOULDER ARTHROSCOPY Right 10/16/2023   Procedure: ARTHROSCOPY, SHOULDER;  Surgeon: Onesimo Anes LABOR, MD;  Location: [facility];  Service: Orthopedics;   Laterality: Right;  Distal clavicle excision   SHOULDER SURGERY Bilateral left 2019;  right 2016   arthroscopy   SHOULDER SURGERY Left    SHOULDER SURGERY Right    Patient Active Problem List   Diagnosis Date Noted   Diarrhea 06/30/2023   Elevated BP without diagnosis of hypertension 02/28/2023   Right hand pain 01/27/2023   Family history of prostate cancer in father 11/04/2022   Hematuria, gross 11/01/2022   Arthralgia 10/28/2022   Anxiety 06/10/2022   Generalized muscle ache 06/10/2022   Nephrolithiasis 04/05/2022   Lesion of left ear 11/05/2021   Tinnitus, bilateral 08/04/2021   Acute MI, 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HCC)    S/P partial colectomy 09/02/2020   Dysplastic colon polyp    Cigarette nicotine  dependence without complication 09/26/2017   Chronic right-sided low back pain with right-sided sciatica 09/26/2017   Mixed hyperlipidemia 09/26/2017   CAD (coronary artery disease) 05/03/2016   Tobacco abuse    Prediabetes    Gastroesophageal reflux disease    ACS (acute coronary syndrome) (HCC) 03/31/2016   Pelvic floor dysfunction 01/14/2016   Urinary hesitancy 12/21/2015    PCP: Edman Das FNP   REFERRING PROVIDER: Onesimo Anes LABOR, MD  REFERRING DIAG:  Diagnosis  M19.011 (ICD-10-CM) - Arthritis of right acromioclavicular joint  S46.811D (ICD-10-CM) - Partial tear of right subscapularis tendon, subsequent encounter    THERAPY DIAG:  Acute pain of right shoulder  Stiffness of right shoulder, not elsewhere classified  Muscle weakness (generalized)  Rationale for Evaluation and Treatment: Rehabilitation  ONSET DATE: s/p R shoulder SAD/DCE, rotator cuff repair, subscap repair 10/16/23  SUBJECTIVE:                                                                                                                                                                                      SUBJECTIVE STATEMENT: Shoulder is doing okay.     EVAL: Surgery was a little more than I  thought it was gonna be. Shoulder feels like frozen gel. Its very stiff without much movement, just got out of the sling last Friday. Doctor gave me pendulums and  that's about it. Had some odd complications after a trigger finger surgery last year, had to see a lot of rheumatologists to r/o autoimmune disease and recover  overall.    Hand dominance: Left  PERTINENT HISTORY: See above   PAIN:  Are you having pain? No 0/10, at worst 4/10 in past week   PRECAUTIONS: Shoulder  RED FLAGS: None   WEIGHT BEARING RESTRICTIONS: No  FALLS:  Has patient fallen in last 6 months? 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   PATIENT SURVEYS:  PSFS: THE PATIENT SPECIFIC FUNCTIONAL SCALE  Place score of 0-10 (0 = unable to perform activity and 10 = able to perform activity at the same level as before injury or problem)  Activity Date: 11/30/23 12/25/23   Working as an personnel officer  0 2   2. Reaching [place]  0 3   3. Putting on belt  5 9   4.      Total Score 1.7 4.7     Total Score = Sum of activity scores/number of activities  Minimally Detectable Change: 3 points (for single activity); 2 points (for average score)  [facility] (nd). The Patient Specific Functional Scale . Retrieved from [email or web address]   COGNITION: Overall cognitive status: Within functional limits for tasks assessed       POSTURE: Forward head, rounded shoulders   UPPER EXTREMITY ROM:    ROM Right eval Right 12/07/23 Right 12/20/23 Right 12/25/23 supine    Shoulder flexion PROM 110-120* pain, AROM supine 100-110* pain PROM 150* PROM 170 AROM 145*  Shoulder extension      Shoulder scaption  PROM 110-120*,  PROM 150* PROM 160 AROM 138*  Shoulder adduction      Shoulder internal rotation Hand to belly at 0* ABD    At about 45* ABD, approximately 70* AROM   Shoulder external rotation 0-10* at 0* ABD supine PROM; able to get to neutral AROM At 0* ABD, 20-30* PROM  At about 45* ABD, approximately 60* AROM  Elbow flexion      Elbow extension      Wrist flexion      Wrist extension      Wrist ulnar deviation      Wrist radial deviation      Wrist pronation      Wrist supination      (Blank rows = not tested)  UPPER EXTREMITY MMT:  MMT Right eval Left eval Right 12/25/23  Shoulder flexion   3  Shoulder extension     Shoulder abduction   3  Shoulder adduction     Shoulder internal rotation   4  Shoulder external rotation   4  Middle trapezius     Lower trapezius     Elbow flexion     Elbow extension     Wrist flexion     Wrist extension     Wrist ulnar deviation     Wrist radial deviation     Wrist pronation     Wrist supination     Grip strength (lbs)     (Blank rows = not tested)  DNT MMT at eval due to pain with basic ROM at eval, functional MMT for flexion and ER approximately 3/5 based on AROM testing    PALPATION:  TTP anterior shoulder                                                                                                                              TREATMENT DATE:  12/20/23 Pulleys 2 min flexion, 2 min abduction, AAROM UBE L2 X 3 min pushing, 3 min pulling Standing shoulder flexion AROM X 10 reps  Seated shoulder aduction AROM X 10 reps Scapular retractions red x15 Shoulder extensions red x15 Shoulder IR red X 10 Shoulder ER red X 10 Bicep curl red X 15 Tricep extension red X 15 Wall push ups (scap protraction) X 10    12/25/23  UBE L2x4 minutes forward, 4 minutes backward for w/u Shoulder  PROM/stretching all directions as appropriate   PSFS, ROM, MMT, goals   Standing shoulder flexion 0# 2x10 R Standing shoulder scaption 0# 2x10 R Scapular retractions red TB x12 Shoulder extensions red TB x12 Serratus punches supine 3# x12  Serratus punch alphabet x2 supine 3#    PATIENT EDUCATION: Education details: exam findings, POC, HEP, post-op limitations from subscap repair (most conservative part of his surgery/slowest recovering) Person educated: Patient Education method: Explanation, Demonstration, Verbal cues, and Handouts Education comprehension: verbalized understanding, returned demonstration, and needs further education  HOME EXERCISE PROGRAM:   Access Code: [email or web address] URL: [email or web address] Date: 11/30/2023 Prepared by: Josette Rough  Exercises - Supine Shoulder Flexion Extension AAROM with Dowel  - 2-3 x daily - 7 x weekly - 1 sets - 10 reps - 3 seconds  hold - Supine Shoulder Press AAROM in Abduction with Dowel  - 2-3 x daily - 7 x weekly - 1 sets - 10 reps - Seated Shoulder External Rotation AAROM with Cane and Hand in Neutral  - 2-3 x daily - 7 x weekly - 1 sets - 10 reps - 3 seconds  hold - Shoulder Flexion Wall Slide with Towel  - 2-3 x daily - 7 x weekly - 1 sets - 10 reps - 3 seconds  hold - Seated Scapular Retraction  - 2-3 x daily - 7 x weekly - 1 sets - 10 reps - Seated Backward Shoulder Rolls  - 2-3 x daily - 7 x weekly - 1 sets - 10 reps Added 12/27/23 (he denies needing pictures of these) Scapular retractions red x15 Shoulder extensions red x15 Shoulder IR red X 10 Shoulder ER red X 10 Bicep curl red X 15 Tricep extension red X 15 Wall push ups (scap protraction) X 10  ASSESSMENT:  CLINICAL IMPRESSION: I progressed his strength program today with good overall tolerance noted. He will add these at home and denies needing pictures of them. We will monitor for any soreness to this.   EVAL: Patient is a 53 y.o. M who was seen  today for physical therapy evaluation and treatment for  Diagnosis  M19.011 (ICD-10-CM) - Arthritis of right acromioclavicular joint  S46.811D (ICD-10-CM) - Partial tear of right subscapularis tendon, subsequent encounter  .  He received surgery as noted above on 10/16/23, also had subscapularis repair which at this point remains the most limiting part of his protocol. MD did not send a formal post-op protocol so used subscap repair protocol from [employer]. Will challenge him as appropriate and able as per protocol and postop restrictions.   OBJECTIVE IMPAIRMENTS: decreased mobility, decreased ROM, decreased strength, increased fascial restrictions, increased muscle spasms, impaired UE functional use, improper body mechanics, postural dysfunction, obesity, and pain.   ACTIVITY LIMITATIONS: carrying, lifting, continence, bathing, toileting, dressing, reach over head, hygiene/grooming, and caring for others  PARTICIPATION LIMITATIONS: meal prep, cleaning, laundry, driving, shopping, community activity, occupation, and yard work  PERSONAL FACTORS: Age, Behavior pattern, Education, Fitness, Past/current experiences, Profession, Social background, and Time since onset of injury/illness/exacerbation are also affecting patient's functional outcome.   REHAB POTENTIAL: Good  CLINICAL DECISION MAKING: Stable/uncomplicated  EVALUATION COMPLEXITY: Low   GOALS: Goals reviewed with patient? No  SHORT TERM GOALS: Target date: 12/28/2023    Patient will be compliant with appropriate progressive HEP        GOAL STATUS: MET 12/25/23  2. R  shoulder flexion and scaption AROM to be at least 150*         GOAL STATUS: ONGOING 12/25/23   3. R shoulder ER and IR A/PROM to be at least 45* at 45* ABD           GOAL STATUS: MET 12/25/23   4. Will be more aware of posture with all functional tasks with use of ergonomic aides PRN/as desired      GOAL STATUS: ONGOING 12/25/23   LONG TERM GOALS:  Target date: 01/25/2024    MMT to have improved by at least one grade in all weak groups       GOAL STATUS: Initial    2. AROM to have normalized and will be pain free all planes of motion      GOAL STATUS: Initial    3. Pain to be no more than 2/10 with all functional tasks     GOAL STATUS: Initial   4. Will be able to perform all functional household and work based tasks without increase from resting pain levels including [place] work for his job as an personnel officer      GOAL STATUS: Initial   5. PSFS  to have improved by at least 3 points to show improved QOL and subjective perception of condition      GOAL STATUS: Initial    PLAN:  PT FREQUENCY: 2x/week  PT DURATION: 8 weeks  PLANNED INTERVENTIONS: 97164- PT Re-evaluation, 97750- Physical Performance Testing, 97110-Therapeutic exercises, 97530- Therapeutic activity, [id number]- Neuromuscular re-education, 97535- Self Care, 02859- Manual therapy, [id number]- Electrical stimulation (manual), [id number]- Vasopneumatic device, [id number]- Ultrasound, [id number]- Ionotophoresis 4mg /ml Dexamethasone , and Patient/Family education  PLAN FOR NEXT SESSION:   Add more band work and strength as tolerated, protocol listed below   [email or web address]  Keep head elevated in supine, no bending forward- eye needs to be above waist per pt; having cataract surgery on other eye 01/19/24  Redell Moose, PT, DPT 12/27/23 10:48 AM

## 2024-01-01 ENCOUNTER — Encounter: Payer: Self-pay | Admitting: Physical Therapy

## 2024-01-01 ENCOUNTER — Ambulatory Visit: Admitting: Physical Therapy

## 2024-01-01 DIAGNOSIS — M25611 Stiffness of right shoulder, not elsewhere classified: Secondary | ICD-10-CM

## 2024-01-01 DIAGNOSIS — M6281 Muscle weakness (generalized): Secondary | ICD-10-CM

## 2024-01-01 DIAGNOSIS — R29898 Other symptoms and signs involving the musculoskeletal system: Secondary | ICD-10-CM

## 2024-01-01 DIAGNOSIS — M25511 Pain in right shoulder: Secondary | ICD-10-CM

## 2024-01-01 NOTE — Therapy (Signed)
 OUTPATIENT PHYSICAL THERAPY SHOULDER TREATMENT    Patient Name: Derrick Turner MRN: [id number] DOB:Jun 06, 1970, 53 y.o., male Today's Date: 01/01/2024  END OF SESSION:  PT End of Session - 01/01/24 1011     Visit Number 7    Number of Visits 17    Date for Recertification  01/25/24    Authorization Type [employer]    PT Start Time 1012    PT Stop Time 1055    PT Time Calculation (min) 43 min    Activity Tolerance Patient tolerated treatment well    Behavior During Therapy [facility] for tasks assessed/performed             Past Medical History:  Diagnosis Date   Arthritis    Coronary artery disease cardiologist--- dr croitoru   ETT 04-01-2016 in epic, reproduced chest pain but no EKG changes;  cardiac cath 04-06-2016 moderate diffuse nonobstructive disease, aggressive medical management   High cholesterol    History of 2019 novel coronavirus disease (COVID-19) 03/02/2020   per pt tested positive covid, copy of result in epic, stated mild symptoms that resolved   History of kidney stones    Incomplete right bundle branch block    Myocardial infarction [facility])    Right ureteral calculus    Wears glasses    Past Surgical History:  Procedure Laterality Date   APPENDECTOMY  teen   BACK SURGERY     two   BIOPSY  08/11/2020   Procedure: BIOPSY;  Surgeon: Cindie Carlin POUR, DO;  Location: [facility];  Service: Endoscopy;;   CATARACT EXTRACTION W/PHACO Left 12/01/2023   Procedure: PHACOEMULSIFICATION, CATARACT, WITH IOL INSERTION;  Surgeon: Juli Blunt, MD;  Location: [facility] OR[facility];  Service: Ophthalmology;  Laterality: Left;  CDE: 7.56   COLONOSCOPY WITH PROPOFOL  N/A 08/11/2020   Procedure: COLONOSCOPY WITH PROPOFOL ;  Surgeon: Cindie Carlin POUR, DO;  Location: [facility];  Service: Endoscopy;  Laterality: N/A;  7:30am   CORONARY/GRAFT ACUTE MI REVASCULARIZATION N/A 07/16/2021   Procedure: Coronary/Graft Acute MI Revascularization;  Surgeon: Verlin Lonni BIRCH, MD;   Location: [facility] INV[facility] CV LAB;  Service: Cardiovascular;  Laterality: N/A;   CYSTOSCOPY WITH RETROGRADE PYELOGRAM, URETEROSCOPY AND STENT PLACEMENT Right 04/14/2020   Procedure: CYSTOSCOPY WITH RETROGRADE PYELOGRAM, URETEROSCOPY AND STENT PLACEMENT;  Surgeon: Rosalind Zachary NOVAK, MD;  Location: [facility];  Service: Urology;  Laterality: Right;   EXTRACORPOREAL SHOCK WAVE LITHOTRIPSY Right 03/23/2020   Procedure: EXTRACORPOREAL SHOCK WAVE LITHOTRIPSY (ESWL);  Surgeon: Carolee Sherwood BIRCH DOUGLAS, MD;  Location: [facility];  Service: Urology;  Laterality: Right;   HOLMIUM LASER APPLICATION Right 04/14/2020   Procedure: HOLMIUM LASER APPLICATION;  Surgeon: Rosalind Zachary NOVAK, MD;  Location: [facility];  Service: Urology;  Laterality: Right;   LAPAROSCOPIC CHOLECYSTECTOMY  2012 approx   LEFT HEART CATH AND CORONARY ANGIOGRAPHY N/A 04/06/2016   Procedure: Left Heart Cath and Coronary Angiography;  Surgeon: Ozell Fell, MD;  Location: [facility] INVASIVE CV LAB;  Service: Cardiovascular;  Laterality: N/A;   LEFT HEART CATH AND CORONARY ANGIOGRAPHY N/A 07/16/2021   Procedure: LEFT HEART CATH AND CORONARY ANGIOGRAPHY;  Surgeon: Verlin Lonni BIRCH, MD;  Location: [facility] CV LAB;  Service: Cardiovascular;  Laterality: N/A;   OPEN SUBSCAPULARIS REPAIR Right 10/16/2023   Procedure: REPAIR, SHOULDER, SUBSCAPULARIS;  Surgeon: Onesimo Oneil LABOR, MD;  Location: [facility];  Service: Orthopedics;  Laterality: Right;   ORCHIECTOMY Right 1990s   per pt benign cyst   PARTIAL COLECTOMY Right 09/02/2020  Procedure: HEMICOLECTOMY;  Surgeon: Mavis Anes, MD;  Location: [facility];  Service: General;  Laterality: Right;   POLYPECTOMY  08/11/2020   Procedure: POLYPECTOMY;  Surgeon: Cindie Carlin POUR, DO;  [facility];  Service: Endoscopy;;   SHOULDER ARTHROSCOPY Right 10/16/2023   Procedure: ARTHROSCOPY, SHOULDER;  Surgeon: Onesimo Anes LABOR, MD;  Location: [facility];  Service:  Orthopedics;  Laterality: Right;  Distal clavicle excision   SHOULDER SURGERY Bilateral left 2019;  right 2016   arthroscopy   SHOULDER SURGERY Left    SHOULDER SURGERY Right    Patient Active Problem List   Diagnosis Date Noted   Diarrhea 06/30/2023   Elevated BP without diagnosis of hypertension 02/28/2023   Right hand pain 01/27/2023   Family history of prostate cancer in father 11/04/2022   Hematuria, gross 11/01/2022   Arthralgia 10/28/2022   Anxiety 06/10/2022   Generalized muscle ache 06/10/2022   Nephrolithiasis 04/05/2022   Lesion of left ear 11/05/2021   Tinnitus, bilateral 08/04/2021   Acute MI, 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HCC)    S/P partial colectomy 09/02/2020   Dysplastic colon polyp    Cigarette nicotine  dependence without complication 09/26/2017   Chronic right-sided low back pain with right-sided sciatica 09/26/2017   Mixed hyperlipidemia 09/26/2017   CAD (coronary artery disease) 05/03/2016   Tobacco abuse    Prediabetes    Gastroesophageal reflux disease    ACS (acute coronary syndrome) (HCC) 03/31/2016   Pelvic floor dysfunction 01/14/2016   Urinary hesitancy 12/21/2015    PCP: Edman Das FNP   REFERRING PROVIDER: Onesimo Anes LABOR, MD  REFERRING DIAG:  Diagnosis  M19.011 (ICD-10-CM) - Arthritis of right acromioclavicular joint  S46.811D (ICD-10-CM) - Partial tear of right subscapularis tendon, subsequent encounter    THERAPY DIAG:  Acute pain of right shoulder  Stiffness of right shoulder, not elsewhere classified  Muscle weakness (generalized)  Other symptoms and signs involving the musculoskeletal system  Rationale for Evaluation and Treatment: Rehabilitation  ONSET DATE: s/p R shoulder SAD/DCE, rotator cuff repair, subscap repair 10/16/23  SUBJECTIVE:                                                                                                                                                                                      SUBJECTIVE  STATEMENT:   Shoulder is doing good, its turned a corner in the past couple of weeks. Feeling a lot better but it still has its moments. No complaints     EVAL: Surgery was a little more than I thought it was gonna be. Shoulder feels like frozen gel. Its very stiff without much movement, just got out of the sling last Friday.  Doctor gave me pendulums and  that's about it. Had some odd complications after a trigger finger surgery last year, had to see a lot of rheumatologists to r/o autoimmune disease and recover overall.    Hand dominance: Left  PERTINENT HISTORY: See above   PAIN:  Are you having pain? No 0/10 now, in the past week has gotten to 4/10 at worst (when doing stuff I shouldn't have been doing)  PRECAUTIONS: Shoulder  RED FLAGS: None   WEIGHT BEARING RESTRICTIONS: No  FALLS:  Has patient fallen in last 6 months? 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   PATIENT SURVEYS:  PSFS: THE PATIENT SPECIFIC FUNCTIONAL SCALE  Place score of 0-10 (0 = unable to perform activity and 10 = able to perform activity at the same level as before injury or problem)  Activity Date: 11/30/23 12/25/23   Working as an personnel officer  0 2   2. Reaching [place]  0 3   3. Putting on belt  5 9   4.      Total Score 1.7 4.7     Total Score = Sum of activity scores/number of activities  Minimally Detectable Change: 3 points (for single activity); 2 points (for average score)  [facility] (nd). The Patient Specific Functional Scale . Retrieved from  [email or web address]   COGNITION: Overall cognitive status: Within functional limits for tasks assessed       POSTURE: Forward head, rounded shoulders   UPPER EXTREMITY ROM:    ROM Right eval Right 12/07/23 Right 12/20/23 Right 12/25/23 supine   Shoulder flexion PROM 110-120* pain, AROM supine 100-110* pain PROM 150* PROM 170 AROM 145*  Shoulder extension      Shoulder scaption  PROM 110-120*,  PROM 150* PROM 160 AROM 138*  Shoulder adduction      Shoulder internal rotation Hand to belly at 0* ABD    At about 45* ABD, approximately 70* AROM   Shoulder external rotation 0-10* at 0* ABD supine PROM; able to get to neutral AROM At 0* ABD, 20-30* PROM  At about 45* ABD, approximately 60* AROM  Elbow flexion      Elbow extension      Wrist flexion      Wrist extension      Wrist ulnar deviation      Wrist radial deviation      Wrist pronation      Wrist supination      (Blank rows = not tested)  UPPER EXTREMITY MMT:  MMT Right eval Left eval Right 12/25/23  Shoulder flexion   3  Shoulder extension     Shoulder abduction   3  Shoulder adduction     Shoulder internal rotation   4  Shoulder external rotation   4  Middle trapezius     Lower trapezius     Elbow flexion     Elbow extension     Wrist flexion     Wrist extension     Wrist ulnar deviation     Wrist radial deviation     Wrist pronation     Wrist supination  Grip strength (lbs)     (Blank rows = not tested)  DNT MMT at eval due to pain with basic ROM at eval, functional MMT for flexion and ER approximately 3/5 based on AROM testing    PALPATION:   TTP anterior shoulder                                                                                                                              TREATMENT DATE:   01/01/24  Shoulder stretching all directions as tolerated  Scap retractions green TB 2x12  Shoulder extensions green TB 2x12  Shoulder  ER green 2x10 B Wall pushups 2x10  Shoulder IR green 2x10 B  UBE L3x4 min forward/4 backward  Body blade straight arm horizontal 2x30 seconds Body blade straight arm vertical 2x30 seconds  Pulleys flexion and scaption for cool down x6 minutes   12/20/23 Pulleys 2 min flexion, 2 min abduction, AAROM UBE L2 X 3 min pushing, 3 min pulling Standing shoulder flexion AROM X 10 reps  Seated shoulder aduction AROM X 10 reps Scapular retractions red x15 Shoulder extensions red x15 Shoulder IR red X 10 Shoulder ER red X 10 Bicep curl red X 15 Tricep extension red X 15 Wall push ups (scap protraction) X 10    12/25/23  UBE L2x4 minutes forward, 4 minutes backward for w/u Shoulder PROM/stretching all directions as appropriate   PSFS, ROM, MMT, goals   Standing shoulder flexion 0# 2x10 R Standing shoulder scaption 0# 2x10 R Scapular retractions red TB x12 Shoulder extensions red TB x12 Serratus punches supine 3# x12  Serratus punch alphabet x2 supine 3#    PATIENT EDUCATION: Education details: exam findings, POC, HEP, post-op limitations from subscap repair (most conservative part of his surgery/slowest recovering) Person educated: Patient Education method: Explanation, Demonstration, Verbal cues, and Handouts Education comprehension: verbalized understanding, returned demonstration, and needs further education  HOME EXERCISE PROGRAM:   Access Code: [email or web address] URL: [email or web address] Date: 11/30/2023 Prepared by: Josette Rough  Exercises - Supine Shoulder Flexion Extension AAROM with Dowel  - 2-3 x daily - 7 x weekly - 1 sets - 10 reps - 3 seconds  hold - Supine Shoulder Press AAROM in Abduction with Dowel  - 2-3 x daily - 7 x weekly - 1 sets - 10 reps - Seated Shoulder External Rotation AAROM with Cane and Hand in Neutral  - 2-3 x daily - 7 x weekly - 1 sets - 10 reps - 3 seconds  hold - Shoulder Flexion Wall Slide with Towel  - 2-3 x daily - 7 x weekly - 1  sets - 10 reps - 3 seconds  hold - Seated Scapular Retraction  - 2-3 x daily - 7 x weekly - 1 sets - 10 reps - Seated Backward Shoulder Rolls  - 2-3 x daily - 7 x weekly - 1 sets - 10 reps Added 12/27/23 (he denies needing pictures of these) Scapular retractions red x15  Shoulder extensions red x15 Shoulder IR red X 10 Shoulder ER red X 10 Bicep curl red X 15 Tricep extension red X 15 Wall push ups (scap protraction) X 10  ASSESSMENT:  CLINICAL IMPRESSION:   Arrives today doing well, ROM is really looking great and we focused more on strengthening again today. Increased resistance as able with good tolerance noted. He reports regaining strength is his biggest concern right now.       EVAL: Patient is a 53 y.o. M who was seen today for physical therapy evaluation and treatment for  Diagnosis  M19.011 (ICD-10-CM) - Arthritis of right acromioclavicular joint  S46.811D (ICD-10-CM) - Partial tear of right subscapularis tendon, subsequent encounter  . He received surgery as noted above on 10/16/23, also had subscapularis repair which at this point remains the most limiting part of his protocol. MD did not send a formal post-op protocol so used subscap repair protocol from [employer]. Will challenge him as appropriate and able as per protocol and postop restrictions.   OBJECTIVE IMPAIRMENTS: decreased mobility, decreased ROM, decreased strength, increased fascial restrictions, increased muscle spasms, impaired UE functional use, improper body mechanics, postural dysfunction, obesity, and pain.   ACTIVITY LIMITATIONS: carrying, lifting, continence, bathing, toileting, dressing, reach over head, hygiene/grooming, and caring for others  PARTICIPATION LIMITATIONS: meal prep, cleaning, laundry, driving, shopping, community activity, occupation, and yard work  PERSONAL FACTORS: Age, Behavior pattern, Education, Fitness, Past/current experiences, Profession, Social background, and Time  since onset of injury/illness/exacerbation are also affecting patient's functional outcome.   REHAB POTENTIAL: Good  CLINICAL DECISION MAKING: Stable/uncomplicated  EVALUATION COMPLEXITY: Low   GOALS: Goals reviewed with patient? No  SHORT TERM GOALS: Target date: 12/28/2023    Patient will be compliant with appropriate progressive HEP        GOAL STATUS: MET 12/25/23  2. R  shoulder flexion and scaption AROM to be at least 150*         GOAL STATUS: ONGOING 12/25/23   3. R shoulder ER and IR A/PROM to be at least 45* at 45* ABD           GOAL STATUS: MET 12/25/23   4. Will be more aware of posture with all functional tasks with use of ergonomic aides PRN/as desired      GOAL STATUS: ONGOING 12/25/23   LONG TERM GOALS: Target date: 01/25/2024    MMT to have improved by at least one grade in all weak groups       GOAL STATUS: Initial    2. AROM to have normalized and will be pain free all planes of motion      GOAL STATUS: Initial    3. Pain to be no more than 2/10 with all functional tasks     GOAL STATUS: Initial   4. Will be able to perform all functional household and work based tasks without increase from resting pain levels including [place] work for his job as an personnel officer      GOAL STATUS: Initial   5. PSFS  to have improved by at least 3 points to show improved QOL and subjective perception of condition      GOAL STATUS: Initial    PLAN:  PT FREQUENCY: 2x/week  PT DURATION: 8 weeks  PLANNED INTERVENTIONS: [id number]- PT Re-evaluation, 97750- Physical Performance Testing, 97110-Therapeutic exercises, 97530- Therapeutic activity, [id number]- Neuromuscular re-education, 97535- Self Care, [id number]- Manual therapy, [id number]- Electrical stimulation (manual), [id number]- Vasopneumatic device, [id number]- Ultrasound, [id number]- Ionotophoresis 4mg /ml Dexamethasone , and Patient/Family  education  PLAN FOR NEXT SESSION:   Add more band work and strength as tolerated, protocol listed below. 11  weeks out on 01/01/24. Sees surgeon on 12/2, update objectives beforehand   [email or web address]  Keep head elevated in supine, no bending forward- eye needs to be above waist per pt; having cataract surgery on other eye 01/19/24  Josette Rough, PT, DPT 01/01/24 10:55 AM

## 2024-01-03 ENCOUNTER — Encounter: Payer: Self-pay | Admitting: Physical Therapy

## 2024-01-03 ENCOUNTER — Ambulatory Visit: Admitting: Physical Therapy

## 2024-01-03 DIAGNOSIS — M25611 Stiffness of right shoulder, not elsewhere classified: Secondary | ICD-10-CM

## 2024-01-03 DIAGNOSIS — M6281 Muscle weakness (generalized): Secondary | ICD-10-CM

## 2024-01-03 DIAGNOSIS — M25511 Pain in right shoulder: Secondary | ICD-10-CM

## 2024-01-03 NOTE — Therapy (Signed)
 OUTPATIENT PHYSICAL THERAPY SHOULDER TREATMENT    Patient Name: Derrick Turner MRN: [id number] DOB:25-Aug-1970, 53 y.o., male Today's Date: 01/03/2024  END OF SESSION:  PT End of Session - 01/03/24 1012     Visit Number 8    Number of Visits 17    Date for Recertification  01/25/24    Authorization Type [employer]    PT Start Time 1010    PT Stop Time 1050    PT Time Calculation (min) 40 min    Activity Tolerance Patient tolerated treatment well    Behavior During Therapy [facility] for tasks assessed/performed             Past Medical History:  Diagnosis Date   Arthritis    Coronary artery disease cardiologist--- dr croitoru   ETT 04-01-2016 in epic, reproduced chest pain but no EKG changes;  cardiac cath 04-06-2016 moderate diffuse nonobstructive disease, aggressive medical management   High cholesterol    History of 2019 novel coronavirus disease (COVID-19) 03/02/2020   per pt tested positive covid, copy of result in epic, stated mild symptoms that resolved   History of kidney stones    Incomplete right bundle branch block    Myocardial infarction [facility])    Right ureteral calculus    Wears glasses    Past Surgical History:  Procedure Laterality Date   APPENDECTOMY  teen   BACK SURGERY     two   BIOPSY  08/11/2020   Procedure: BIOPSY;  Surgeon: Cindie Carlin POUR, DO;  Location: [facility];  Service: Endoscopy;;   CATARACT EXTRACTION W/PHACO Left 12/01/2023   Procedure: PHACOEMULSIFICATION, CATARACT, WITH IOL INSERTION;  Surgeon: Juli Blunt, MD;  Location: [facility];  Service: Ophthalmology;  Laterality: Left;  CDE: 7.56   COLONOSCOPY WITH PROPOFOL  N/A 08/11/2020   Procedure: COLONOSCOPY WITH PROPOFOL ;  Surgeon: Cindie Carlin POUR, DO;  [facility]: [facility];  Service: Endoscopy;  Laterality: N/A;  7:30am   CORONARY/GRAFT ACUTE MI REVASCULARIZATION N/A 07/16/2021   Procedure: Coronary/Graft Acute MI Revascularization;  Surgeon: Verlin Lonni BIRCH, MD;   Location: [facility];  Service: Cardiovascular;  Laterality: N/A;   CYSTOSCOPY WITH RETROGRADE PYELOGRAM, URETEROSCOPY AND STENT PLACEMENT Right 04/14/2020   Procedure: CYSTOSCOPY WITH RETROGRADE PYELOGRAM, URETEROSCOPY AND STENT PLACEMENT;  Surgeon: Rosalind Zachary NOVAK, MD;  Location: [facility];  Service: Urology;  Laterality: Right;   EXTRACORPOREAL SHOCK WAVE LITHOTRIPSY Right 03/23/2020   Procedure: EXTRACORPOREAL SHOCK WAVE LITHOTRIPSY (ESWL);  Surgeon: Carolee Sherwood BIRCH DOUGLAS, MD;  Location: [facility];  Service: Urology;  Laterality: Right;   HOLMIUM LASER APPLICATION Right 04/14/2020   Procedure: HOLMIUM LASER APPLICATION;  Surgeon: Rosalind Zachary NOVAK, MD;  Location: [facility];  Service: Urology;  Laterality: Right;   LAPAROSCOPIC CHOLECYSTECTOMY  2012 approx   LEFT HEART CATH AND CORONARY ANGIOGRAPHY N/A 04/06/2016   Procedure: Left Heart Cath and Coronary Angiography;  Surgeon: Ozell Fell, MD;  Location: [facility] INVASIVE CV LAB;  Service: Cardiovascular;  Laterality: N/A;   LEFT HEART CATH AND CORONARY ANGIOGRAPHY N/A 07/16/2021   Procedure: LEFT HEART CATH AND CORONARY ANGIOGRAPHY;  Surgeon: Verlin Lonni BIRCH, MD;  Location: [facility];  Service: Cardiovascular;  Laterality: N/A;   OPEN SUBSCAPULARIS REPAIR Right 10/16/2023   Procedure: REPAIR, SHOULDER, SUBSCAPULARIS;  Surgeon: Onesimo Oneil LABOR, MD;  Location: [facility];  Service: Orthopedics;  Laterality: Right;   ORCHIECTOMY Right 1990s   per pt benign cyst   PARTIAL COLECTOMY Right 09/02/2020  Procedure: HEMICOLECTOMY;  Surgeon: Mavis Anes, MD;  Location: [facility];  Service: General;  Laterality: Right;   POLYPECTOMY  08/11/2020   Procedure: POLYPECTOMY;  Surgeon: Cindie Carlin POUR, DO;  [facility];  Service: Endoscopy;;   SHOULDER ARTHROSCOPY Right 10/16/2023   Procedure: ARTHROSCOPY, SHOULDER;  Surgeon: Onesimo Anes LABOR, MD;  Location: [facility];  Service:  Orthopedics;  Laterality: Right;  Distal clavicle excision   SHOULDER SURGERY Bilateral left 2019;  right 2016   arthroscopy   SHOULDER SURGERY Left    SHOULDER SURGERY Right    Patient Active Problem List   Diagnosis Date Noted   Diarrhea 06/30/2023   Elevated BP without diagnosis of hypertension 02/28/2023   Right hand pain 01/27/2023   Family history of prostate cancer in father 11/04/2022   Hematuria, gross 11/01/2022   Arthralgia 10/28/2022   Anxiety 06/10/2022   Generalized muscle ache 06/10/2022   Nephrolithiasis 04/05/2022   Lesion of left ear 11/05/2021   Tinnitus, bilateral 08/04/2021   Acute MI, 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HCC)    S/P partial colectomy 09/02/2020   Dysplastic colon polyp    Cigarette nicotine  dependence without complication 09/26/2017   Chronic right-sided low back pain with right-sided sciatica 09/26/2017   Mixed hyperlipidemia 09/26/2017   CAD (coronary artery disease) 05/03/2016   Tobacco abuse    Prediabetes    Gastroesophageal reflux disease    ACS (acute coronary syndrome) (HCC) 03/31/2016   Pelvic floor dysfunction 01/14/2016   Urinary hesitancy 12/21/2015    PCP: Edman Das FNP   REFERRING PROVIDER: Onesimo Anes LABOR, MD  REFERRING DIAG:  Diagnosis  M19.011 (ICD-10-CM) - Arthritis of right acromioclavicular joint  S46.811D (ICD-10-CM) - Partial tear of right subscapularis tendon, subsequent encounter    THERAPY DIAG:  Acute pain of right shoulder  Stiffness of right shoulder, not elsewhere classified  Muscle weakness (generalized)  Rationale for Evaluation and Treatment: Rehabilitation  ONSET DATE: s/p R shoulder SAD/DCE, rotator cuff repair, subscap repair 10/16/23  SUBJECTIVE:                                                                                                                                                                                      SUBJECTIVE STATEMENT:   Shoulder is doing good.    EVAL: Surgery was a  little more than I thought it was gonna be. Shoulder feels like frozen gel. Its very stiff without much movement, just got out of the sling last Friday. Doctor gave me pendulums and  that's about it. Had some odd complications after a trigger finger surgery last year, had to see a lot of rheumatologists to r/o autoimmune disease and  recover overall.    Hand dominance: Left  PERTINENT HISTORY: See above   PAIN:  Are you having pain? No 0/10 now, in the past week has gotten to 4/10 at worst (when doing stuff I shouldn't have been doing)  PRECAUTIONS: Shoulder  RED FLAGS: None   WEIGHT BEARING RESTRICTIONS: No  FALLS:  Has patient fallen in last 6 months? 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   PATIENT SURVEYS:  PSFS: THE PATIENT SPECIFIC FUNCTIONAL SCALE  Place score of 0-10 (0 = unable to perform activity and 10 = able to perform activity at the same level as before injury or problem)  Activity Date: 11/30/23 12/25/23   Working as an personnel officer  0 2   2. Reaching [place]  0 3   3. Putting on belt  5 9   4.      Total Score 1.7 4.7     Total Score = Sum of activity scores/number of activities  Minimally Detectable Change: 3 points (for single activity); 2 points (for average score)  [facility] (nd). The Patient Specific Functional Scale . Retrieved from [email or web address]   COGNITION: Overall cognitive status: Within functional limits for tasks assessed       POSTURE: Forward head, rounded shoulders   UPPER EXTREMITY  ROM:    ROM Right eval Right 12/07/23 Right 12/20/23 Right 12/25/23 supine   Shoulder flexion PROM 110-120* pain, AROM supine 100-110* pain PROM 150* PROM 170 AROM 145*  Shoulder extension      Shoulder scaption  PROM 110-120*,  PROM 150* PROM 160 AROM 138*  Shoulder adduction      Shoulder internal rotation Hand to belly at 0* ABD    At about 45* ABD, approximately 70* AROM   Shoulder external rotation 0-10* at 0* ABD supine PROM; able to get to neutral AROM At 0* ABD, 20-30* PROM  At about 45* ABD, approximately 60* AROM  Elbow flexion      Elbow extension      Wrist flexion      Wrist extension      Wrist ulnar deviation      Wrist radial deviation      Wrist pronation      Wrist supination      (Blank rows = not tested)  UPPER EXTREMITY MMT:  MMT Right eval Left eval Right 12/25/23  Shoulder flexion   3  Shoulder extension     Shoulder abduction   3  Shoulder adduction     Shoulder internal rotation   4  Shoulder external rotation   4  Middle trapezius     Lower trapezius     Elbow flexion     Elbow extension     Wrist flexion     Wrist extension     Wrist ulnar deviation     Wrist radial deviation     Wrist pronation     Wrist supination     Grip strength (lbs)     (Blank rows = not tested)  DNT MMT at eval due to pain with basic ROM at eval, functional MMT for flexion and  ER approximately 3/5 based on AROM testing    PALPATION:   TTP anterior shoulder                                                                                                                              TREATMENT DATE:  01/03/24 UBE L3x4 min forward/4 backward  Pulleys flexion and scaption 4 minutes total, 2 min each Scap retractions green TB 2x12 B Shoulder extensions green TB 2x12 B Shoulder ER green 2x12 B Wall pushups 2x12  Shoulder IR green 2x12 B Tricep extension green 2X12 Bicep curl green 2X12 Standing shoulder flexion 1# X 10 bilat Standing shoulder abduction 1# X 10  bilat  01/01/24  Shoulder stretching all directions as tolerated  Scap retractions green TB 2x12  Shoulder extensions green TB 2x12  Shoulder ER green 2x10 B Wall pushups 2x10  Shoulder IR green 2x10 B  UBE L3x4 min forward/4 backward  Body blade straight arm horizontal 2x30 seconds Body blade straight arm vertical 2x30 seconds  Pulleys flexion and scaption for cool down x6 minutes     PATIENT EDUCATION: Education details: exam findings, POC, HEP, post-op limitations from subscap repair (most conservative part of his surgery/slowest recovering) Person educated: Patient Education method: Explanation, Demonstration, Verbal cues, and Handouts Education comprehension: verbalized understanding, returned demonstration, and needs further education  HOME EXERCISE PROGRAM:   Access Code: [email or web address] URL: [email or web address] Date: 11/30/2023 Prepared by: Josette Rough  Exercises - Supine Shoulder Flexion Extension AAROM with Dowel  - 2-3 x daily - 7 x weekly - 1 sets - 10 reps - 3 seconds  hold - Supine Shoulder Press AAROM in Abduction with Dowel  - 2-3 x daily - 7 x weekly - 1 sets - 10 reps - Seated Shoulder External Rotation AAROM with Cane and Hand in Neutral  - 2-3 x daily - 7 x weekly - 1 sets - 10 reps - 3 seconds  hold - Shoulder Flexion Wall Slide with Towel  - 2-3 x daily - 7 x weekly - 1 sets - 10 reps - 3 seconds  hold - Seated Scapular Retraction  - 2-3 x daily - 7 x weekly - 1 sets - 10 reps - Seated Backward Shoulder Rolls  - 2-3 x daily - 7 x weekly - 1 sets - 10 reps Added 12/27/23 (he denies needing pictures of these) Scapular retractions red x15 Shoulder extensions red x15 Shoulder IR red X 10 Shoulder ER red X 10 Bicep curl red X 15 Tricep extension red X 15 Wall push ups (scap protraction) X 10  ASSESSMENT:  CLINICAL IMPRESSION:   His shoulder is progressing well and pain has been well controlled this week. He will need MD progress note next  visit.   EVAL: Patient is a 53 y.o. M who was seen today for physical therapy evaluation and treatment for  Diagnosis  M19.011 (ICD-10-CM) - Arthritis of right acromioclavicular joint  S46.811D (ICD-10-CM) - Partial tear  of right subscapularis tendon, subsequent encounter  . He received surgery as noted above on 10/16/23, also had subscapularis repair which at this point remains the most limiting part of his protocol. MD did not send a formal post-op protocol so used subscap repair protocol from [employer]. Will challenge him as appropriate and able as per protocol and postop restrictions.   OBJECTIVE IMPAIRMENTS: decreased mobility, decreased ROM, decreased strength, increased fascial restrictions, increased muscle spasms, impaired UE functional use, improper body mechanics, postural dysfunction, obesity, and pain.   ACTIVITY LIMITATIONS: carrying, lifting, continence, bathing, toileting, dressing, reach over head, hygiene/grooming, and caring for others  PARTICIPATION LIMITATIONS: meal prep, cleaning, laundry, driving, shopping, community activity, occupation, and yard work  PERSONAL FACTORS: Age, Behavior pattern, Education, Fitness, Past/current experiences, Profession, Social background, and Time since onset of injury/illness/exacerbation are also affecting patient's functional outcome.   REHAB POTENTIAL: Good  CLINICAL DECISION MAKING: Stable/uncomplicated  EVALUATION COMPLEXITY: Low   GOALS: Goals reviewed with patient? No  SHORT TERM GOALS: Target date: 12/28/2023    Patient will be compliant with appropriate progressive HEP        GOAL STATUS: MET 12/25/23  2. R  shoulder flexion and scaption AROM to be at least 150*         GOAL STATUS: ONGOING 12/25/23   3. R shoulder ER and IR A/PROM to be at least 45* at 45* ABD           GOAL STATUS: MET 12/25/23   4. Will be more aware of posture with all functional tasks with use of ergonomic aides PRN/as desired       GOAL STATUS: ONGOING 12/25/23   LONG TERM GOALS: Target date: 01/25/2024    MMT to have improved by at least one grade in all weak groups       GOAL STATUS: Initial    2. AROM to have normalized and will be pain free all planes of motion      GOAL STATUS: Initial    3. Pain to be no more than 2/10 with all functional tasks     GOAL STATUS: Initial   4. Will be able to perform all functional household and work based tasks without increase from resting pain levels including [place] work for his job as an personnel officer      GOAL STATUS: Initial   5. PSFS  to have improved by at least 3 points to show improved QOL and subjective perception of condition      GOAL STATUS: Initial    PLAN:  PT FREQUENCY: 2x/week  PT DURATION: 8 weeks  PLANNED INTERVENTIONS: 97164- PT Re-evaluation, 97750- Physical Performance Testing, 97110-Therapeutic exercises, 97530- Therapeutic activity, [id number]- Neuromuscular re-education, 97535- Self Care, [id number]- Manual therapy, [id number]- Electrical stimulation (manual), [id number]- Vasopneumatic device, [id number]- Ultrasound, [id number]- Ionotophoresis 4mg /ml Dexamethasone , and Patient/Family education  PLAN FOR NEXT SESSION:   strength as tolerated, protocol listed below. 11 weeks out on 01/01/24. Sees surgeon on 12/2, update objectives beforehand   [email or web address]  Keep head elevated in supine, no bending forward- eye needs to be above waist per pt; having cataract surgery on other eye 01/19/24  Redell Moose, PT, DPT 01/03/24 10:56 AM

## 2024-01-08 ENCOUNTER — Ambulatory Visit: Admitting: Physical Therapy

## 2024-01-08 ENCOUNTER — Encounter: Payer: Self-pay | Admitting: Physical Therapy

## 2024-01-08 DIAGNOSIS — M6281 Muscle weakness (generalized): Secondary | ICD-10-CM | POA: Diagnosis present

## 2024-01-08 DIAGNOSIS — M25511 Pain in right shoulder: Secondary | ICD-10-CM | POA: Diagnosis present

## 2024-01-08 DIAGNOSIS — R29898 Other symptoms and signs involving the musculoskeletal system: Secondary | ICD-10-CM | POA: Insufficient documentation

## 2024-01-08 DIAGNOSIS — R278 Other lack of coordination: Secondary | ICD-10-CM | POA: Diagnosis present

## 2024-01-08 DIAGNOSIS — M25611 Stiffness of right shoulder, not elsewhere classified: Secondary | ICD-10-CM | POA: Diagnosis present

## 2024-01-08 NOTE — Therapy (Signed)
 OUTPATIENT PHYSICAL THERAPY SHOULDER TREATMENT    Patient Name: Derrick Turner MRN: [id number] DOB:11/14/1970, 53 y.o., male Today's Date: 01/08/2024  END OF SESSION:  PT End of Session - 01/08/24 1021     Visit Number 9    Number of Visits 17    Date for Recertification  01/25/24    Authorization Type [employer]    PT Start Time 1015    PT Stop Time 1055    PT Time Calculation (min) 40 min    Activity Tolerance Patient tolerated treatment well    Behavior During Therapy [facility] for tasks assessed/performed             Past Medical History:  Diagnosis Date   Arthritis    Coronary artery disease cardiologist--- dr croitoru   ETT 04-01-2016 in epic, reproduced chest pain but no EKG changes;  cardiac cath 04-06-2016 moderate diffuse nonobstructive disease, aggressive medical management   High cholesterol    History of 2019 novel coronavirus disease (COVID-19) 03/02/2020   per pt tested positive covid, copy of result in epic, stated mild symptoms that resolved   History of kidney stones    Incomplete right bundle branch block    Myocardial infarction [facility])    Right ureteral calculus    Wears glasses    Past Surgical History:  Procedure Laterality Date   APPENDECTOMY  teen   BACK SURGERY     two   BIOPSY  08/11/2020   Procedure: BIOPSY;  Surgeon: Cindie Carlin POUR, DO;  [facility]: [facility];  Service: Endoscopy;;   CATARACT EXTRACTION W/PHACO Left 12/01/2023   Procedure: PHACOEMULSIFICATION, CATARACT, WITH IOL INSERTION;  Surgeon: Juli Blunt, MD;  Location: [facility];  Service: Ophthalmology;  Laterality: Left;  CDE: 7.56   COLONOSCOPY WITH PROPOFOL  N/A 08/11/2020   Procedure: COLONOSCOPY WITH PROPOFOL ;  Surgeon: Cindie Carlin POUR, DO;  [facility]: [facility];  Service: Endoscopy;  Laterality: N/A;  7:30am   CORONARY/GRAFT ACUTE MI REVASCULARIZATION N/A 07/16/2021   Procedure: Coronary/Graft Acute MI Revascularization;  Surgeon: Verlin Lonni BIRCH, MD;   Location: [facility] INV[facility];  Service: Cardiovascular;  Laterality: N/A;   CYSTOSCOPY WITH RETROGRADE PYELOGRAM, URETEROSCOPY AND STENT PLACEMENT Right 04/14/2020   Procedure: CYSTOSCOPY WITH RETROGRADE PYELOGRAM, URETEROSCOPY AND STENT PLACEMENT;  Surgeon: Rosalind Zachary NOVAK, MD;  Location: [facility];  Service: Urology;  Laterality: Right;   EXTRACORPOREAL SHOCK WAVE LITHOTRIPSY Right 03/23/2020   Procedure: EXTRACORPOREAL SHOCK WAVE LITHOTRIPSY (ESWL);  Surgeon: Carolee Sherwood BIRCH DOUGLAS, MD;  Location: [facility];  Service: Urology;  Laterality: Right;   HOLMIUM LASER APPLICATION Right 04/14/2020   Procedure: HOLMIUM LASER APPLICATION;  Surgeon: Rosalind Zachary NOVAK, MD;  Location: [facility];  Service: Urology;  Laterality: Right;   LAPAROSCOPIC CHOLECYSTECTOMY  2012 approx   LEFT HEART CATH AND CORONARY ANGIOGRAPHY N/A 04/06/2016   Procedure: Left Heart Cath and Coronary Angiography;  Surgeon: Ozell Fell, MD;  Location: [facility] INVASIVE CV LAB;  Service: Cardiovascular;  Laterality: N/A;   LEFT HEART CATH AND CORONARY ANGIOGRAPHY N/A 07/16/2021   Procedure: LEFT HEART CATH AND CORONARY ANGIOGRAPHY;  Surgeon: Verlin Lonni BIRCH, MD;  Location: [facility];  Service: Cardiovascular;  Laterality: N/A;   OPEN SUBSCAPULARIS REPAIR Right 10/16/2023   Procedure: REPAIR, SHOULDER, SUBSCAPULARIS;  Surgeon: Onesimo Oneil LABOR, MD;  Location: [facility];  Service: Orthopedics;  Laterality: Right;   ORCHIECTOMY Right 1990s   per pt benign cyst   PARTIAL COLECTOMY Right 09/02/2020  Procedure: HEMICOLECTOMY;  Surgeon: Mavis Anes, MD;  Location: [facility];  Service: General;  Laterality: Right;   POLYPECTOMY  08/11/2020   Procedure: POLYPECTOMY;  Surgeon: Cindie Carlin POUR, DO;  [facility];  Service: Endoscopy;;   SHOULDER ARTHROSCOPY Right 10/16/2023   Procedure: ARTHROSCOPY, SHOULDER;  Surgeon: Onesimo Anes LABOR, MD;  Location: [facility];  Service:  Orthopedics;  Laterality: Right;  Distal clavicle excision   SHOULDER SURGERY Bilateral left 2019;  right 2016   arthroscopy   SHOULDER SURGERY Left    SHOULDER SURGERY Right    Patient Active Problem List   Diagnosis Date Noted   Diarrhea 06/30/2023   Elevated BP without diagnosis of hypertension 02/28/2023   Right hand pain 01/27/2023   Family history of prostate cancer in father 11/04/2022   Hematuria, gross 11/01/2022   Arthralgia 10/28/2022   Anxiety 06/10/2022   Generalized muscle ache 06/10/2022   Nephrolithiasis 04/05/2022   Lesion of left ear 11/05/2021   Tinnitus, bilateral 08/04/2021   Acute MI, 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HCC)    S/P partial colectomy 09/02/2020   Dysplastic colon polyp    Cigarette nicotine  dependence without complication 09/26/2017   Chronic right-sided low back pain with right-sided sciatica 09/26/2017   Mixed hyperlipidemia 09/26/2017   CAD (coronary artery disease) 05/03/2016   Tobacco abuse    Prediabetes    Gastroesophageal reflux disease    ACS (acute coronary syndrome) (HCC) 03/31/2016   Pelvic floor dysfunction 01/14/2016   Urinary hesitancy 12/21/2015    PCP: Edman Das FNP   REFERRING PROVIDER: Onesimo Anes LABOR, MD  REFERRING DIAG:  Diagnosis  M19.011 (ICD-10-CM) - Arthritis of right acromioclavicular joint  S46.811D (ICD-10-CM) - Partial tear of right subscapularis tendon, subsequent encounter    THERAPY DIAG:  Acute pain of right shoulder  Stiffness of right shoulder, not elsewhere classified  Muscle weakness (generalized)  Other symptoms and signs involving the musculoskeletal system  Rationale for Evaluation and Treatment: Rehabilitation  ONSET DATE: s/p R shoulder SAD/DCE, rotator cuff repair, subscap repair 10/16/23  SUBJECTIVE:                                                                                                                                                                                      SUBJECTIVE  STATEMENT:   Shoulder is sore from getting up a down tree that fell in his yard, used chainsaw a lot. Not really having pain just soreness    EVAL: Surgery was a little more than I thought it was gonna be. Shoulder feels like frozen gel. Its very stiff without much movement, just got out of the sling last Friday. Doctor gave me  pendulums and  that's about it. Had some odd complications after a trigger finger surgery last year, had to see a lot of rheumatologists to r/o autoimmune disease and recover overall.    Hand dominance: Left  PERTINENT HISTORY: See above   PAIN:  Are you having pain? No 0/10 now, does report increased soreness.  PRECAUTIONS: Shoulder  RED FLAGS: None   WEIGHT BEARING RESTRICTIONS: No  FALLS:  Has patient fallen in last 6 months? 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   PATIENT SURVEYS:  PSFS: THE PATIENT SPECIFIC FUNCTIONAL SCALE  Place score of 0-10 (0 = unable to perform activity and 10 = able to perform activity at the same level as before injury or problem)  Activity Date: 11/30/23 12/25/23   Working as an personnel officer  0 2   2. Reaching OH  0 3   3. Putting on belt  5 9   4.      Total Score 1.7 4.7     Total Score = Sum of activity scores/number of activities  Minimally Detectable Change: 3 points (for single activity); 2 points (for average score)  [facility] (nd). The Patient Specific Functional Scale . Retrieved from [email or web address]   COGNITION: Overall cognitive status: Within  functional limits for tasks assessed       POSTURE: Forward head, rounded shoulders   UPPER EXTREMITY ROM:    ROM Right eval Right 12/07/23 Right 12/20/23 Right 12/25/23 supine  Right 01/08/24 Standing  Shoulder flexion PROM 110-120* pain, AROM supine 100-110* pain PROM 150* PROM 170 AROM 145* AROM 160  Shoulder extension       Shoulder scaption  PROM 110-120*,  PROM 150* PROM 160 AROM 138* AROM 160  Shoulder adduction       Shoulder internal rotation Hand to belly at 0* ABD    At about 45* ABD, approximately 70* AROM  WNL  Shoulder external rotation 0-10* at 0* ABD supine PROM; able to get to neutral AROM At 0* ABD, 20-30* PROM  At about 45* ABD, approximately 60* AROM 85  Elbow flexion       Elbow extension       Wrist flexion       Wrist extension       Wrist ulnar deviation       Wrist radial deviation       Wrist pronation       Wrist supination       (Blank rows = not tested)  UPPER EXTREMITY MMT:  MMT Right eval Left eval Right 12/25/23 Right 01/08/24  Shoulder flexion   3 4-  Shoulder extension      Shoulder abduction   3 4-  Shoulder adduction      Shoulder internal rotation   4 5  Shoulder external rotation   4 4  Middle trapezius      Lower trapezius      Elbow flexion      Elbow extension      Wrist flexion      Wrist extension      Wrist ulnar deviation  Wrist radial deviation      Wrist pronation      Wrist supination      Grip strength (lbs)      (Blank rows = not tested)  DNT MMT at eval due to pain with basic ROM at eval, functional MMT for flexion and ER approximately 3/5 based on AROM testing    PALPATION:   TTP anterior shoulder                                                                                                                              TREATMENT DATE:  01/08/24 UBE L3x4 min forward/4 backward  Pulleys flexion and scaption 4 minutes total, 2 min each Scap retractions green TB 2x12 B Shoulder extensions green TB  2x12 B Shoulder ER green 2x12 B Wall pushups 2x12  Shoulder IR green 2x12 B Tricep extension green 2X12 Bicep curl green 2X12 Standing shoulder flexion green 10 bilat Standing shoulder abduction green X 10 bilat Standing OH 2.2#ball rolls X 10 up/down, X 10 lateral, X 10 circles CW,CCW Standing Y up wall red X10  01/03/24 UBE L3x4 min forward/4 backward  Pulleys flexion and scaption 4 minutes total, 2 min each Scap retractions green TB 2x12 B Shoulder extensions green TB 2x12 B Shoulder ER green 2x12 B Wall pushups 2x12  Shoulder IR green 2x12 B Tricep extension green 2X12 Bicep curl green 2X12 Standing shoulder flexion 1# X 10 bilat Standing shoulder abduction 1# X 10 bilat      PATIENT EDUCATION: Education details: exam findings, POC, HEP, post-op limitations from subscap repair (most conservative part of his surgery/slowest recovering) Person educated: Patient Education method: Explanation, Demonstration, Verbal cues, and Handouts Education comprehension: verbalized understanding, returned demonstration, and needs further education  HOME EXERCISE PROGRAM:   Access Code: [email or web address] URL: [email or web address] Date: 11/30/2023 Prepared by: Josette Rough  Exercises - Supine Shoulder Flexion Extension AAROM with Dowel  - 2-3 x daily - 7 x weekly - 1 sets - 10 reps - 3 seconds  hold - Supine Shoulder Press AAROM in Abduction with Dowel  - 2-3 x daily - 7 x weekly - 1 sets - 10 reps - Seated Shoulder External Rotation AAROM with Cane and Hand in Neutral  - 2-3 x daily - 7 x weekly - 1 sets - 10 reps - 3 seconds  hold - Shoulder Flexion Wall Slide with Towel  - 2-3 x daily - 7 x weekly - 1 sets - 10 reps - 3 seconds  hold - Seated Scapular Retraction  - 2-3 x daily - 7 x weekly - 1 sets - 10 reps - Seated Backward Shoulder Rolls  - 2-3 x daily - 7 x weekly - 1 sets - 10 reps Added 12/27/23 (he denies needing pictures of these) Scapular retractions red  x15 Shoulder extensions red x15 Shoulder IR red X 10 Shoulder ER red X 10 Bicep curl red X 15 Tricep extension red X 15  Wall push ups (scap protraction) X 10  ASSESSMENT:  CLINICAL IMPRESSION:   His shoulder is progressing well and he showed good progress with updated measurements. 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   EVAL: Patient is a 53 y.o. M who was seen today for physical therapy evaluation and treatment for  Diagnosis  M19.011 (ICD-10-CM) - Arthritis of right acromioclavicular joint  S46.811D (ICD-10-CM) - Partial tear of right subscapularis tendon, subsequent encounter  . He received surgery as noted above on 10/16/23, also had subscapularis repair which at this point remains the most limiting part of his protocol. MD did not send a formal post-op protocol so used subscap repair protocol from [employer]. Will challenge him as appropriate and able as per protocol and postop restrictions.   OBJECTIVE IMPAIRMENTS: decreased mobility, decreased ROM, decreased strength, increased fascial restrictions, increased muscle spasms, impaired UE functional use, improper body mechanics, postural dysfunction, obesity, and pain.   ACTIVITY LIMITATIONS: carrying, lifting, continence, bathing, toileting, dressing, reach over head, hygiene/grooming, and caring for others  PARTICIPATION LIMITATIONS: meal prep, cleaning, laundry, driving, shopping, community activity, occupation, and yard work  PERSONAL FACTORS: Age, Behavior pattern, Education, Fitness, Past/current experiences, Profession, Social background, and Time since onset of injury/illness/exacerbation are also affecting patient's functional outcome.   REHAB POTENTIAL: Good  CLINICAL DECISION MAKING: Stable/uncomplicated  EVALUATION COMPLEXITY: Low   GOALS: Goals reviewed with patient? No  SHORT TERM GOALS:  Target date: 12/28/2023    Patient will be compliant with appropriate progressive HEP        GOAL STATUS: MET 12/25/23  2. R  shoulder flexion and scaption AROM to be at least 150*         GOAL STATUS: ONGOING 12/25/23   3. R shoulder ER and IR A/PROM to be at least 45* at 45* ABD           GOAL STATUS: MET 12/25/23   4. Will be more aware of posture with all functional tasks with use of ergonomic aides PRN/as desired      GOAL STATUS: ONGOING 12/25/23   LONG TERM GOALS: Target date: 01/25/2024    MMT to have improved by at least one grade in all weak groups       GOAL STATUS: Initial    2. AROM to have normalized and will be pain free all planes of motion      GOAL STATUS: Initial    3. Pain to be no more than 2/10 with all functional tasks     GOAL STATUS: Initial   4. Will be able to perform all functional household and work based tasks without increase from resting pain levels including OH work for his job as an personnel officer      GOAL STATUS: Initial   5. PSFS  to have improved by at least 3 points to show improved QOL and subjective perception of condition      GOAL STATUS: Initial    PLAN:  PT FREQUENCY: 2x/week  PT DURATION: 8 weeks  PLANNED INTERVENTIONS: 97164- PT Re-evaluation, 97750- Physical Performance Testing, 97110-Therapeutic exercises, 97530- Therapeutic activity, [id number]- Neuromuscular re-education, 97535- Self Care, 02859- Manual therapy, [id number]- Electrical stimulation (manual), [id number]- Vasopneumatic device, [id number]- Ultrasound, 02966- Ionotophoresis 4mg /ml Dexamethasone , and Patient/Family education  PLAN FOR NEXT SESSION:   strength as tolerated, and gradual overhead activity as he wants to return to work  as personnel officer.   [email or web address]  Keep head elevated in supine, no bending forward- eye needs to be above waist per pt;  having cataract surgery on other eye 01/19/24  Redell Moose, PT, DPT 01/08/24 10:23 AM

## 2024-01-09 ENCOUNTER — Ambulatory Visit: Admitting: Orthopedic Surgery

## 2024-01-09 ENCOUNTER — Encounter: Payer: Self-pay | Admitting: Orthopedic Surgery

## 2024-01-09 VITALS — Ht 70.0 in | Wt 232.2 lb

## 2024-01-09 DIAGNOSIS — M19011 Primary osteoarthritis, right shoulder: Secondary | ICD-10-CM

## 2024-01-09 DIAGNOSIS — S46811D Strain of other muscles, fascia and tendons at shoulder and upper arm level, right arm, subsequent encounter: Secondary | ICD-10-CM

## 2024-01-09 NOTE — Progress Notes (Unsigned)
 Orthopaedic Postop Note  Assessment: Derrick Turner is a 53 y.o. male s/p right shoulder arthroscopy, repair of subscapularis and distal clavicle excision  DOS: 10/16/2023  Plan: Mr. Schimming is doing well.  He continues to work with therapy.  His motion is improving.  He has very little pain.  He is pleased with his improvements thus far.  He does have some days which are worse than others.  Follow-up: Return in about 6 weeks (around 02/20/2024). XR at next visit: None  Subjective:  Chief Complaint  Patient presents with   Shoulder Pain    R/ a little sore not bad. Used chainsaw Saturday and it has been sore ever since.    History of Present Illness: Derrick Turner is a 53 y.o. male who presents following the above stated procedure.  Surgery was approximately 6 weeks ago.  He has done well.  He states he was trying to move some stuff at home, and has irritated his shoulder.  He is complaining of pain in the anterior shoulder, as well as the superior shoulder.  He denies a popping sensation.  He does not think that he has injured his shoulder, but it is irritated today.   Review of Systems: No fevers or chills No numbness or tingling No Chest Pain No shortness of breath   Objective: Ht 5' 10 (1.778 m)   Wt 232 lb 4 oz (105.3 kg)   BMI 33.32 kg/m   Physical Exam:  Incisions have healed.  No surrounding erythema or drainage.  Sensation is intact throughout the right upper extremity.  There is no bruising.  He has tenderness within the trapezius.  Mild tenderness over the anterior shoulder.  He tolerates passive forward flexion to 100 degrees.  90 degrees of passive abduction.  He has some discomfort in the anterior shoulder with passive external rotation to 25 degrees.   IMAGING: I personally ordered and reviewed the following images:  No new imaging obtained today.   [name], MD 01/09/2024 2:58 PM

## 2024-01-10 ENCOUNTER — Encounter: Payer: Self-pay | Admitting: Physical Therapy

## 2024-01-10 ENCOUNTER — Encounter: Payer: Self-pay | Admitting: Orthopedic Surgery

## 2024-01-10 ENCOUNTER — Ambulatory Visit: Admitting: Physical Therapy

## 2024-01-10 DIAGNOSIS — M25611 Stiffness of right shoulder, not elsewhere classified: Secondary | ICD-10-CM

## 2024-01-10 DIAGNOSIS — R29898 Other symptoms and signs involving the musculoskeletal system: Secondary | ICD-10-CM

## 2024-01-10 DIAGNOSIS — M25511 Pain in right shoulder: Secondary | ICD-10-CM | POA: Diagnosis not present

## 2024-01-10 DIAGNOSIS — R278 Other lack of coordination: Secondary | ICD-10-CM

## 2024-01-10 DIAGNOSIS — M6281 Muscle weakness (generalized): Secondary | ICD-10-CM

## 2024-01-10 NOTE — Therapy (Signed)
 OUTPATIENT PHYSICAL THERAPY SHOULDER TREATMENT    Patient Name: Derrick Turner MRN: [id number] DOB:[birth year], 53 y.o., male Today's Date: 01/10/2024  END OF SESSION:  PT End of Session - 01/10/24 1014     Visit Number 10    Number of Visits 17    Date for Recertification  01/25/24    Authorization Type [employer]    PT Start Time 1015    PT Stop Time 1055    PT Time Calculation (min) 40 min    Activity Tolerance Patient tolerated treatment well    Behavior During Therapy [facility] for tasks assessed/performed            Past Medical History:  Diagnosis Date   Arthritis    Coronary artery disease cardiologist--- dr croitoru   ETT 04-01-2016 in epic, reproduced chest pain but no EKG changes;  cardiac cath 04-06-2016 moderate diffuse nonobstructive disease, aggressive medical management   High cholesterol    History of 2019 novel coronavirus disease (COVID-19) 03/02/2020   per pt tested positive covid, copy of result in epic, stated mild symptoms that resolved   History of kidney stones    Incomplete right bundle branch block    Myocardial infarction [facility])    Right ureteral calculus    Wears glasses    Past Surgical History:  Procedure Laterality Date   APPENDECTOMY  teen   BACK SURGERY     two   BIOPSY  08/11/2020   Procedure: BIOPSY;  Surgeon: Cindie Carlin POUR, DO;  [facility]: [facility];  Service: Endoscopy;;   CATARACT EXTRACTION W/PHACO Left 12/01/2023   Procedure: PHACOEMULSIFICATION, CATARACT, WITH IOL INSERTION;  Surgeon: Juli Blunt, MD;  Location: [facility];  Service: Ophthalmology;  Laterality: Left;  CDE: 7.56   COLONOSCOPY WITH PROPOFOL  N/A 08/11/2020   Procedure: COLONOSCOPY WITH PROPOFOL ;  Surgeon: Cindie Carlin POUR, DO;  [facility]: [facility];  Service: Endoscopy;  Laterality: N/A;  7:30am   CORONARY/GRAFT ACUTE MI REVASCULARIZATION N/A 07/16/2021   Procedure: Coronary/Graft Acute MI Revascularization;  Surgeon: Verlin Lonni BIRCH, MD;  [facility]:  [facility];  Service: Cardiovascular;  Laterality: N/A;   CYSTOSCOPY WITH RETROGRADE PYELOGRAM, URETEROSCOPY AND STENT PLACEMENT Right 04/14/2020   Procedure: CYSTOSCOPY WITH RETROGRADE PYELOGRAM, URETEROSCOPY AND STENT PLACEMENT;  Surgeon: Rosalind Zachary NOVAK, MD;  Location: [facility];  Service: Urology;  Laterality: Right;   EXTRACORPOREAL SHOCK WAVE LITHOTRIPSY Right 03/23/2020   Procedure: EXTRACORPOREAL SHOCK WAVE LITHOTRIPSY (ESWL);  Surgeon: Carolee Sherwood BIRCH DOUGLAS, MD;  Location: [facility];  Service: Urology;  Laterality: Right;   HOLMIUM LASER APPLICATION Right 04/14/2020   Procedure: HOLMIUM LASER APPLICATION;  Surgeon: Rosalind Zachary NOVAK, MD;  Location: [facility];  Service: Urology;  Laterality: Right;   LAPAROSCOPIC CHOLECYSTECTOMY  2012 approx   LEFT HEART CATH AND CORONARY ANGIOGRAPHY N/A 04/06/2016   Procedure: Left Heart Cath and Coronary Angiography;  Surgeon: Ozell Fell, MD;  Location: [facility] INVASIVE CV LAB;  Service: Cardiovascular;  Laterality: N/A;   LEFT HEART CATH AND CORONARY ANGIOGRAPHY N/A 07/16/2021   Procedure: LEFT HEART CATH AND CORONARY ANGIOGRAPHY;  Surgeon: Verlin Lonni BIRCH, MD;  Location: [facility];  Service: Cardiovascular;  Laterality: N/A;   OPEN SUBSCAPULARIS REPAIR Right 10/16/2023   Procedure: REPAIR, SHOULDER, SUBSCAPULARIS;  Surgeon: Onesimo Oneil LABOR, MD;  Location: [facility];  Service: Orthopedics;  Laterality: Right;   ORCHIECTOMY Right 1990s   per pt benign cyst   PARTIAL COLECTOMY Right 09/02/2020  Procedure: HEMICOLECTOMY;  Surgeon: Mavis Anes, MD;  Location: [facility];  Service: General;  Laterality: Right;   POLYPECTOMY  08/11/2020   Procedure: POLYPECTOMY;  Surgeon: Cindie Carlin POUR, DO;  [facility];  Service: Endoscopy;;   SHOULDER ARTHROSCOPY Right 10/16/2023   Procedure: ARTHROSCOPY, SHOULDER;  Surgeon: Onesimo Anes LABOR, MD;  Location: [facility];  Service: Orthopedics;   Laterality: Right;  Distal clavicle excision   SHOULDER SURGERY Bilateral left 2019;  right 2016   arthroscopy   SHOULDER SURGERY Left    SHOULDER SURGERY Right    Patient Active Problem List   Diagnosis Date Noted   Diarrhea 06/30/2023   Elevated BP without diagnosis of hypertension 02/28/2023   Right hand pain 01/27/2023   Family history of prostate cancer in father 11/04/2022   Hematuria, gross 11/01/2022   Arthralgia 10/28/2022   Anxiety 06/10/2022   Generalized muscle ache 06/10/2022   Nephrolithiasis 04/05/2022   Lesion of left ear 11/05/2021   Tinnitus, bilateral 08/04/2021   Acute MI, 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HCC)    S/P partial colectomy 09/02/2020   Dysplastic colon polyp    Cigarette nicotine  dependence without complication 09/26/2017   Chronic right-sided low back pain with right-sided sciatica 09/26/2017   Mixed hyperlipidemia 09/26/2017   CAD (coronary artery disease) 05/03/2016   Tobacco abuse    Prediabetes    Gastroesophageal reflux disease    ACS (acute coronary syndrome) (HCC) 03/31/2016   Pelvic floor dysfunction 01/14/2016   Urinary hesitancy 12/21/2015    PCP: Edman Das FNP   REFERRING PROVIDER: Onesimo Anes LABOR, MD  REFERRING DIAG:  Diagnosis  M19.011 (ICD-10-CM) - Arthritis of right acromioclavicular joint  S46.811D (ICD-10-CM) - Partial tear of right subscapularis tendon, subsequent encounter    THERAPY DIAG:  Acute pain of right shoulder  Stiffness of right shoulder, not elsewhere classified  Muscle weakness (generalized)  Other symptoms and signs involving the musculoskeletal system  Other lack of coordination  Rationale for Evaluation and Treatment: Rehabilitation  ONSET DATE: s/p R shoulder SAD/DCE, rotator cuff repair, subscap repair 10/16/23  SUBJECTIVE:                                                                                                                                                                                       SUBJECTIVE STATEMENT:  Reports shoulder is feeling good today. States at work the heaviest he has to lift overhead with both arms is about 20lbs. With one hand he needs to be able to lift and use his drill. Will follow up with ortho in 6 weeks.    EVAL: Surgery was a little more than I thought it was  gonna be. Shoulder feels like frozen gel. Its very stiff without much movement, just got out of the sling last Friday. Doctor gave me pendulums and  that's about it. Had some odd complications after a trigger finger surgery last year, had to see a lot of rheumatologists to r/o autoimmune disease and recover overall.    Hand dominance: Left  PERTINENT HISTORY: See above   PAIN:  Are you having pain? No 0/10 now, does report increased soreness.  PRECAUTIONS: Shoulder  RED FLAGS: None   WEIGHT BEARING RESTRICTIONS: No  FALLS:  Has patient fallen in last 6 months? 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   PATIENT SURVEYS:  PSFS: THE PATIENT SPECIFIC FUNCTIONAL SCALE  Place score of 0-10 (0 = unable to perform activity and 10 = able to perform activity at the same level as before injury or problem)  Activity Date: 11/30/23 12/25/23   Working as an personnel officer  0 2   2. Reaching OH  0 3   3. Putting on belt  5 9   4.      Total Score 1.7 4.7     Total Score = Sum of activity scores/number of activities  Minimally Detectable Change: 3 points (for single activity); 2 points (for average score)  [facility] (nd). The Patient Specific Functional Scale . Retrieved from  [email or web address]   COGNITION: Overall cognitive status: Within functional limits for tasks assessed       POSTURE: Forward head, rounded shoulders   UPPER EXTREMITY ROM:    ROM Right eval Right 12/07/23 Right 12/20/23 Right 12/25/23 supine  Right 01/08/24 Standing  Shoulder flexion PROM 110-120* pain, AROM supine 100-110* pain PROM 150* PROM 170 AROM 145* AROM 160  Shoulder extension       Shoulder scaption  PROM 110-120*,  PROM 150* PROM 160 AROM 138* AROM 160  Shoulder adduction       Shoulder internal rotation Hand to belly at 0* ABD    At about 45* ABD, approximately 70* AROM  WNL  Shoulder external rotation 0-10* at 0* ABD supine PROM; able to get to neutral AROM At 0* ABD, 20-30* PROM  At about 45* ABD, approximately 60* AROM 85  Elbow flexion       Elbow extension       Wrist flexion       Wrist extension       Wrist ulnar deviation       Wrist radial deviation       Wrist pronation       Wrist supination       (Blank rows = not tested)  UPPER EXTREMITY MMT:  MMT Right eval Left eval Right 12/25/23 Right 01/08/24  Shoulder flexion   3 4-  Shoulder extension      Shoulder abduction   3 4-  Shoulder adduction      Shoulder internal rotation   4 5  Shoulder external rotation   4 4  Middle trapezius      Lower trapezius      Elbow flexion      Elbow extension  Wrist flexion      Wrist extension      Wrist ulnar deviation      Wrist radial deviation      Wrist pronation      Wrist supination      Grip strength (lbs)      (Blank rows = not tested)  DNT MMT at eval due to pain with basic ROM at eval, functional MMT for flexion and ER approximately 3/5 based on AROM testing    PALPATION:  TTP anterior shoulder                                                                                                                              TREATMENT DATE:  01/10/24 UBE L4 x 4 min fwd/4 min  bwd Standing on airex beam, lift 3# up to top cabinet shelf 2x10 Standing on airex beam, lifting 3# up to top cabinet shelf, sliding into horizontal abd/add 2x10 Standing on airex beam, Y holding on bodyblade  Standing on airex beam, shoulder flexion bodyblade up/down at different heights x30 each Standing on airex beam, shoulder flexion bodyblade L<>R at different heights x30 each Standing row at pulleys 22# 2x10 each low, middle, and high Standing push down at pulleys down 17# 2x10 each Standing forward press at pulleys 2.5# 2x10 each  01/08/24 UBE L3x4 min forward/4 backward  Pulleys flexion and scaption 4 minutes total, 2 min each Scap retractions green TB 2x12 B Shoulder extensions green TB 2x12 B Shoulder ER green 2x12 B Wall pushups 2x12  Shoulder IR green 2x12 B Tricep extension green 2X12 Bicep curl green 2X12 Standing shoulder flexion green 10 bilat Standing shoulder abduction green X 10 bilat Standing OH 2.2#ball rolls X 10 up/down, X 10 lateral, X 10 circles CW,CCW Standing Y up wall red X10  01/03/24 UBE L3x4 min forward/4 backward  Pulleys flexion and scaption 4 minutes total, 2 min each Scap retractions green TB 2x12 B Shoulder extensions green TB 2x12 B Shoulder ER green 2x12 B Wall pushups 2x12  Shoulder IR green 2x12 B Tricep extension green 2X12 Bicep curl green 2X12 Standing shoulder flexion 1# X 10 bilat Standing shoulder abduction 1# X 10 bilat      PATIENT EDUCATION: Education details: exam findings, POC, HEP, post-op limitations from subscap repair (most conservative part of his surgery/slowest recovering) Person educated: Patient Education method: Explanation, Demonstration, Verbal cues, and Handouts Education comprehension: verbalized understanding, returned demonstration, and needs further education  HOME EXERCISE PROGRAM:   Access Code: [email or web address] URL: [email or web address] Date: 11/30/2023 Prepared by: Josette Rough  Exercises - Supine Shoulder Flexion Extension AAROM with Dowel  - 2-3 x daily - 7 x weekly - 1 sets - 10 reps - 3 seconds  hold - Supine Shoulder Press AAROM in Abduction with Dowel  - 2-3 x daily - 7 x weekly - 1 sets - 10 reps - Seated Shoulder External Rotation AAROM with Cane and Hand in Neutral  -  2-3 x daily - 7 x weekly - 1 sets - 10 reps - 3 seconds  hold - Shoulder Flexion Wall Slide with Towel  - 2-3 x daily - 7 x weekly - 1 sets - 10 reps - 3 seconds  hold - Seated Scapular Retraction  - 2-3 x daily - 7 x weekly - 1 sets - 10 reps - Seated Backward Shoulder Rolls  - 2-3 x daily - 7 x weekly - 1 sets - 10 reps Added 12/27/23 (he denies needing pictures of these) Scapular retractions red x15 Shoulder extensions red x15 Shoulder IR red X 10 Shoulder ER red X 10 Bicep curl red X 15 Tricep extension red X 15 Wall push ups (scap protraction) X 10  ASSESSMENT:  CLINICAL IMPRESSION:  Treatment focused on establishing more strength for work related tasks. Challenged with bodyblade to improve overall shoulder and scapular stability with perturbations such as when he uses his drill as well as for overall muscle endurance. Included standing balance to improve overall stability for overhead tasks while on a ladder/narrower BOS. Weakest with forward presses. Discussed watching his level of soreness and that if our focus is on strengthening he may need to do his exercises every other day.   EVAL: Patient is a 53 y.o. M who was seen today for physical therapy evaluation and treatment for  Diagnosis  M19.011 (ICD-10-CM) - Arthritis of right acromioclavicular joint  S46.811D (ICD-10-CM) - Partial tear of right subscapularis tendon, subsequent encounter  . He received surgery as noted above on 10/16/23, also had subscapularis repair which at this point remains the most limiting part of his protocol. MD did not send a formal post-op protocol so used subscap repair protocol from [facility]. Will challenge him as appropriate and able as per protocol and postop restrictions.   OBJECTIVE IMPAIRMENTS: decreased mobility, decreased ROM, decreased strength, increased fascial restrictions, increased muscle spasms, impaired UE functional use, improper body mechanics, postural dysfunction, obesity, and pain.   ACTIVITY LIMITATIONS: carrying, lifting, continence, bathing, toileting, dressing, reach over head, hygiene/grooming, and caring for others  PARTICIPATION LIMITATIONS: meal prep, cleaning, laundry, driving, shopping, community activity, occupation, and yard work  PERSONAL FACTORS: Age, Behavior pattern, Education, Fitness, Past/current experiences, Profession, Social background, and Time since onset of injury/illness/exacerbation are also affecting patient's functional outcome.   REHAB POTENTIAL: Good  CLINICAL DECISION MAKING: Stable/uncomplicated  EVALUATION COMPLEXITY: Low   GOALS: Goals reviewed with patient? No  SHORT TERM GOALS: Target date: 12/28/2023    Patient will be compliant with appropriate progressive HEP        GOAL STATUS: MET 12/25/23  2. R  shoulder flexion and scaption AROM to be at least 150*         GOAL STATUS: ONGOING 12/25/23   3. R shoulder ER and IR A/PROM to be at least 45* at 45* ABD           GOAL STATUS: MET 12/25/23   4. Will be more aware of posture with all functional tasks with use of ergonomic aides PRN/as desired      GOAL STATUS: ONGOING 12/25/23   LONG TERM GOALS: Target date: 01/25/2024    MMT to have improved by at least one grade in all weak groups       GOAL STATUS: Initial    2. AROM to have normalized and will be pain free all planes of motion      GOAL STATUS: Initial    3. Pain to be  no more than 2/10 with all functional tasks     GOAL STATUS: Initial   4. 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 PSFS  to have improved by at least 3 points to show improved QOL and subjective perception of condition      GOAL STATUS: Initial    PLAN:  PT FREQUENCY: 2x/week  PT DURATION: 8 weeks  PLANNED INTERVENTIONS: 97164- PT Re-evaluation, 97750- Physical Performance Testing, 97110-Therapeutic exercises, 97530- Therapeutic activity, [id number]- Neuromuscular re-education, 97535- Self Care, 02859- Manual therapy, [id number]- Electrical stimulation (manual), [id number]- Vasopneumatic device, [id number]- Ultrasound, 02966- Ionotophoresis 4mg /ml Dexamethasone , and Patient/Family education  PLAN FOR NEXT SESSION:   strength as tolerated, and gradual overhead activity as he wants to return to work as personnel officer.   [email or web address]  Keep head elevated in supine, no bending forward- eye needs to be above waist per pt; having cataract surgery on other eye 01/19/24  Lola Czerwonka April Ma L Southmayd, PT, DPT 01/10/24 10:14 AM

## 2024-01-12 NOTE — H&P (Signed)
 Surgical History & Physical  Patient Name: Amer Alcindor  DOB: 14-Nov-1970  Surgery: Cataract extraction with intraocular lens implant phacoemulsification; Right Eye Surgeon: Marsa Cleverly MD Surgery Date: 01/19/2024 Pre-Op Date: 12/06/2023  HPI: A 56 Yr. old male patient 1. The patient is returning for a cataract follow-up of the left eye. Since the last visit, the affected area is doing well. The patient's vision is improved. The condition's severity is constant. Patient is following medication instructions. Patient complains of recognizing people from a distance, reading fine print, difficulties seeing road signs all due to blurred vison OD. Patient would like to proceed with cataract surgery OD.  Medical History: Cataracts  Arthritis Heart Problem LDL  Review of Systems Cardiovascular CAD, hx heart attack 2023 Musculoskeletal arthritis pains All recorded systems are negative except as noted above.  Social Current every day smoker   Medication Prednisolone-moxiflox-bromfen,  Atorvastatin , Ergocalciferol  (vitamin D2), Citalopram   Sx/Procedures Phaco c IOL OS,  Partial colectomy, Shoulder surgery bilateral, Back Surgery, Stents due to heart attack, Hand surgery  Drug Allergies  NKDA  History & Physical: Heent: cataract NECK: supple without bruits LUNGS: lungs clear to auscultation CV: regular rate and rhythm Abdomen: soft and non-tender  Impression & Plan: Assessment: 1.  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  CATARACT EXTRACTION STATUS; Left Eye (Z98.42) 3.  INTRAOCULAR LENS IOL (Z96.1)  Plan: 1.  Cataracts are visually significant and account for the patient's complaints. Discussed all risks, benefits, procedures and recovery, including infection, loss of vision and eye, need for glasses after surgery or additional procedures. Patient understands changing glasses will not improve vision. Patient indicated understanding of procedure.  All questions answered. Patient desires to have surgery, recommend phacoemulsification with intraocular lens. Patient to have preliminary testing necessary ([employer]/IOL Master, Mac OCT, TOPO) Educational materials provided:Cataract.  Plan: - Proceed with cataract surgery OD when ready - Plan for best distance target with DIB00 - No DM, no fuchs, no prior eye surgery - good dilation - Dextenza  if available  2.  CE/PCIOL OS 12/01/23 POD5 exam. Doing well. All post-op precautions discussed and instructions reviewed. Written instructions given.  3.  See above

## 2024-01-15 ENCOUNTER — Encounter (HOSPITAL_COMMUNITY): Payer: Self-pay

## 2024-01-15 ENCOUNTER — Other Ambulatory Visit: Payer: Self-pay

## 2024-01-15 ENCOUNTER — Ambulatory Visit: Admitting: Physical Therapy

## 2024-01-15 ENCOUNTER — Encounter (HOSPITAL_COMMUNITY)
Admission: RE | Admit: 2024-01-15 | Discharge: 2024-01-15 | Disposition: A | Source: Ambulatory Visit | Attending: Optometry | Admitting: Optometry

## 2024-01-15 HISTORY — DX: Gout, unspecified: M10.9

## 2024-01-17 ENCOUNTER — Encounter: Payer: Self-pay | Admitting: Physical Therapy

## 2024-01-17 ENCOUNTER — Ambulatory Visit: Admitting: Physical Therapy

## 2024-01-17 DIAGNOSIS — M25611 Stiffness of right shoulder, not elsewhere classified: Secondary | ICD-10-CM

## 2024-01-17 DIAGNOSIS — M6281 Muscle weakness (generalized): Secondary | ICD-10-CM

## 2024-01-17 DIAGNOSIS — M25511 Pain in right shoulder: Secondary | ICD-10-CM | POA: Diagnosis not present

## 2024-01-17 NOTE — Therapy (Signed)
 OUTPATIENT PHYSICAL THERAPY SHOULDER TREATMENT    Patient Name: Derrick Turner MRN: [id number] DOB:03/24/1970, 53 y.o., male Today's Date: 01/17/2024  END OF SESSION:  PT End of Session - 01/17/24 1018     Visit Number 11    Number of Visits 17    Date for Recertification  01/25/24    Authorization Type [employer]et[employer]    PT Start Time 1015    PT Stop Time 1055    PT Time Calculation (min) 40 min    Activity Tolerance Patient tolerated treatment well    Behavior During Therapy [facility] for tasks assessed/performed            Past Medical History:  Diagnosis Date   Arthritis    Coronary artery disease cardiologist--- dr croitoru   ETT 04-01-2016 in epic, reproduced chest pain but no EKG changes;  cardiac cath 04-06-2016 moderate diffuse nonobstructive disease, aggressive medical management   Gout    High cholesterol    History of 2019 novel coronavirus disease (COVID-19) 03/02/2020   per pt tested positive covid, copy of result in epic, stated mild symptoms that resolved   History of kidney stones    Incomplete right bundle branch block    Myocardial infarction [facility])    Right ureteral calculus    Wears glasses    Past Surgical History:  Procedure Laterality Date   APPENDECTOMY  teen   BACK SURGERY     two   BIOPSY  08/11/2020   Procedure: BIOPSY;  Surgeon: Cindie Carlin POUR, DO;  [facility]: [facility];  Service: Endoscopy;;   CATARACT EXTRACTION W/PHACO Left 12/01/2023   Procedure: PHACOEMULSIFICATION, CATARACT, WITH IOL INSERTION;  Surgeon: Juli Blunt, MD;  Location: [facility] ORS;  Service: Ophthalmology;  Laterality: Left;  CDE: 7.56   COLONOSCOPY WITH PROPOFOL  N/A 08/11/2020   Procedure: COLONOSCOPY WITH PROPOFOL ;  Surgeon: Cindie Carlin POUR, DO;  Location: [facility];  Service: Endoscopy;  Laterality: N/A;  7:30am   CORONARY/GRAFT ACUTE MI REVASCULARIZATION N/A 07/16/2021   Procedure: Coronary/Graft Acute MI Revascularization;  Surgeon: Verlin Lonni BIRCH, MD;   Location: [facility] INV[facility] CV LAB;  Service: Cardiovascular;  Laterality: N/A;   CYSTOSCOPY WITH RETROGRADE PYELOGRAM, URETEROSCOPY AND STENT PLACEMENT Right 04/14/2020   Procedure: CYSTOSCOPY WITH RETROGRADE PYELOGRAM, URETEROSCOPY AND STENT PLACEMENT;  Surgeon: Rosalind Zachary NOVAK, MD;  Location: [facility];  Service: Urology;  Laterality: Right;   EXTRACORPOREAL SHOCK WAVE LITHOTRIPSY Right 03/23/2020   Procedure: EXTRACORPOREAL SHOCK WAVE LITHOTRIPSY (ESWL);  Surgeon: Carolee Sherwood BIRCH DOUGLAS, MD;  Location: [facility];  Service: Urology;  Laterality: Right;   HOLMIUM LASER APPLICATION Right 04/14/2020   Procedure: HOLMIUM LASER APPLICATION;  Surgeon: Rosalind Zachary NOVAK, MD;  Location: [facility];  Service: Urology;  Laterality: Right;   LAPAROSCOPIC CHOLECYSTECTOMY  2012 approx   LEFT HEART CATH AND CORONARY ANGIOGRAPHY N/A 04/06/2016   Procedure: Left Heart Cath and Coronary Angiography;  Surgeon: Ozell Fell, MD;  Location: [facility] INVASIVE CV LAB;  Service: Cardiovascular;  Laterality: N/A;   LEFT HEART CATH AND CORONARY ANGIOGRAPHY N/A 07/16/2021   Procedure: LEFT HEART CATH AND CORONARY ANGIOGRAPHY;  Surgeon: Verlin Lonni BIRCH, MD;  Location: [facility] CV LAB;  Service: Cardiovascular;  Laterality: N/A;   OPEN SUBSCAPULARIS REPAIR Right 10/16/2023   Procedure: REPAIR, SHOULDER, SUBSCAPULARIS;  Surgeon: Onesimo Oneil LABOR, MD;  Location: [facility];  Service: Orthopedics;  Laterality: Right;   ORCHIECTOMY Right 1990s   per pt benign cyst   PARTIAL  COLECTOMY Right 09/02/2020   Procedure: HEMICOLECTOMY;  Surgeon: Mavis Anes, MD;  Location: [facility];  Service: General;  Laterality: Right;   POLYPECTOMY  08/11/2020   Procedure: POLYPECTOMY;  Surgeon: Cindie Carlin POUR, DO;  [facility];  Service: Endoscopy;;   SHOULDER ARTHROSCOPY Right 10/16/2023   Procedure: ARTHROSCOPY, SHOULDER;  Surgeon: Onesimo Anes LABOR, MD;  Location: [facility];  Service:  Orthopedics;  Laterality: Right;  Distal clavicle excision   SHOULDER SURGERY Bilateral left 2019;  right 2016   arthroscopy   SHOULDER SURGERY Left    SHOULDER SURGERY Right    Patient Active Problem List   Diagnosis Date Noted   Diarrhea 06/30/2023   Elevated BP without diagnosis of hypertension 02/28/2023   Right hand pain 01/27/2023   Family history of prostate cancer in father 11/04/2022   Hematuria, gross 11/01/2022   Arthralgia 10/28/2022   Anxiety 06/10/2022   Generalized muscle ache 06/10/2022   Nephrolithiasis 04/05/2022   Lesion of left ear 11/05/2021   Tinnitus, bilateral 08/04/2021   Acute MI, 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HCC)    S/P partial colectomy 09/02/2020   Dysplastic colon polyp    Cigarette nicotine  dependence without complication 09/26/2017   Chronic right-sided low back pain with right-sided sciatica 09/26/2017   Mixed hyperlipidemia 09/26/2017   CAD (coronary artery disease) 05/03/2016   Tobacco abuse    Prediabetes    Gastroesophageal reflux disease    ACS (acute coronary syndrome) (HCC) 03/31/2016   Pelvic floor dysfunction 01/14/2016   Urinary hesitancy 12/21/2015    PCP: Edman Das FNP   REFERRING PROVIDER: Onesimo Anes LABOR, MD  REFERRING DIAG:  Diagnosis  M19.011 (ICD-10-CM) - Arthritis of right acromioclavicular joint  S46.811D (ICD-10-CM) - Partial tear of right subscapularis tendon, subsequent encounter    THERAPY DIAG:  Acute pain of right shoulder  Stiffness of right shoulder, not elsewhere classified  Muscle weakness (generalized)  Rationale for Evaluation and Treatment: Rehabilitation  ONSET DATE: s/p R shoulder SAD/DCE, rotator cuff repair, subscap repair 10/16/23  SUBJECTIVE:                                                                                                                                                                                      SUBJECTIVE STATEMENT:  Reports shoulder is a little sore but not bad 3/10. He  has been using more weight for his strength program at home   EVAL: Surgery was a little more than I thought it was gonna be. Shoulder feels like frozen gel. Its very stiff without much movement, just got out of the sling last Friday. Doctor gave me pendulums and  that's about it. Had some  odd complications after a trigger finger surgery last year, had to see a lot of rheumatologists to r/o autoimmune disease and recover overall.    Hand dominance: Left  PERTINENT HISTORY: See above   PAIN:  Are you having pain? Yes 3/10 but more soreness than pain in his right shoulder  PRECAUTIONS: Shoulder  RED FLAGS: None   WEIGHT BEARING RESTRICTIONS: No  FALLS:  Has patient fallen in last 6 months? 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   PATIENT SURVEYS:  PSFS: THE PATIENT SPECIFIC FUNCTIONAL SCALE  Place score of 0-10 (0 = unable to perform activity and 10 = able to perform activity at the same level as before injury or problem)  Activity Date: 11/30/23 12/25/23   Working as an personnel officer  0 2   2. Reaching OH  0 3   3. Putting on belt  5 9   4.      Total Score 1.7 4.7     Total Score = Sum of activity scores/number of activities  Minimally Detectable Change: 3 points (for single activity); 2 points (for average score)  [facility] (nd). The Patient Specific Functional Scale . Retrieved from [email or web address]   COGNITION: Overall cognitive status: Within functional limits for tasks assessed       POSTURE: Forward head,  rounded shoulders   UPPER EXTREMITY ROM:    ROM Right eval Right 12/07/23 Right 12/20/23 Right 12/25/23 supine  Right 01/08/24 Standing  Shoulder flexion PROM 110-120* pain, AROM supine 100-110* pain PROM 150* PROM 170 AROM 145* AROM 160  Shoulder extension       Shoulder scaption  PROM 110-120*,  PROM 150* PROM 160 AROM 138* AROM 160  Shoulder adduction       Shoulder internal rotation Hand to belly at 0* ABD    At about 45* ABD, approximately 70* AROM  WNL  Shoulder external rotation 0-10* at 0* ABD supine PROM; able to get to neutral AROM At 0* ABD, 20-30* PROM  At about 45* ABD, approximately 60* AROM 85  Elbow flexion       Elbow extension       Wrist flexion       Wrist extension       Wrist ulnar deviation       Wrist radial deviation       Wrist pronation       Wrist supination       (Blank rows = not tested)  UPPER EXTREMITY MMT:  MMT Right eval Left eval Right 12/25/23 Right 01/08/24  Shoulder flexion   3 4-  Shoulder extension      Shoulder abduction   3 4-  Shoulder adduction      Shoulder internal rotation   4 5  Shoulder external rotation   4 4  Middle trapezius      Lower trapezius      Elbow flexion      Elbow extension      Wrist flexion      Wrist extension      Wrist ulnar deviation      Wrist radial deviation  Wrist pronation      Wrist supination      Grip strength (lbs)      (Blank rows = not tested)  DNT MMT at eval due to pain with basic ROM at eval, functional MMT for flexion and ER approximately 3/5 based on AROM testing    PALPATION:  TTP anterior shoulder                                                                                                                              TREATMENT DATE:  01/17/24 UBE L4 x 4 min fwd/4 min bwd Wall ladder X 10 reps flexion, X 10 reps scaption, holding 5 sec Standing forward press at pulleys 2.5# 2x10 each Standing row at pulleys 22# 2x10 each low, middle, and high Standing bicep curl  at pulleys 7.5# X 10 Standing push down at pulleys down 7.5# x10 each Standing on bench, feeding stretch out strap through pull up handles to simulate running electric wire Standing on airex beam, lift 2,3,4# up to top cabinet shelf then bring all down, then repeat for 2nd set using Rt UE only.  Standing shoulder flexion bilat 2# 2X10 Standing shoulder abduction bilat 2# 2X10 Standing shoulder press bilat 2# 2X10    01/10/24 UBE L4 x 4 min fwd/4 min bwd Standing on airex beam, lift 3# up to top cabinet shelf 2x10 Standing on airex beam, lifting 3# up to top cabinet shelf, sliding into horizontal abd/add 2x10 Standing on airex beam, Y holding on bodyblade  Standing on airex beam, shoulder flexion bodyblade up/down at different heights x30 each Standing on airex beam, shoulder flexion bodyblade L<>R at different heights x30 each Standing row at pulleys 22# 2x10 each low, middle, and high Standing push down at pulleys down 17# 2x10 each Standing forward press at pulleys 2.5# 2x10 each  01/08/24 UBE L3x4 min forward/4 backward  Pulleys flexion and scaption 4 minutes total, 2 min each Scap retractions green TB 2x12 B Shoulder extensions green TB 2x12 B Shoulder ER green 2x12 B Wall pushups 2x12  Shoulder IR green 2x12 B Tricep extension green 2X12 Bicep curl green 2X12 Standing shoulder flexion green 10 bilat Standing shoulder abduction green X 10 bilat Standing OH 2.2#ball rolls X 10 up/down, X 10 lateral, X 10 circles CW,CCW Standing Y up wall red X10    PATIENT EDUCATION: Education details: exam findings, POC, HEP, post-op limitations from subscap repair (most conservative part of his surgery/slowest recovering) Person educated: Patient Education method: Explanation, Demonstration, Verbal cues, and Handouts Education comprehension: verbalized understanding, returned demonstration, and needs further education  HOME EXERCISE PROGRAM:   Access Code: [email or web address] URL:  [email or web address] Date: 11/30/2023 Prepared by: Josette Rough  Exercises - Supine Shoulder Flexion Extension AAROM with Dowel  - 2-3 x daily - 7 x weekly - 1 sets - 10 reps - 3 seconds  hold - Supine Shoulder Press AAROM in Abduction with Dowel  - 2-3 x daily -  7 x weekly - 1 sets - 10 reps - Seated Shoulder External Rotation AAROM with Cane and Hand in Neutral  - 2-3 x daily - 7 x weekly - 1 sets - 10 reps - 3 seconds  hold - Shoulder Flexion Wall Slide with Towel  - 2-3 x daily - 7 x weekly - 1 sets - 10 reps - 3 seconds  hold - Seated Scapular Retraction  - 2-3 x daily - 7 x weekly - 1 sets - 10 reps - Seated Backward Shoulder Rolls  - 2-3 x daily - 7 x weekly - 1 sets - 10 reps Added 12/27/23 (he denies needing pictures of these) Scapular retractions red x15 Shoulder extensions red x15 Shoulder IR red X 10 Shoulder ER red X 10 Bicep curl red X 15 Tricep extension red X 15 Wall push ups (scap protraction) X 10  ASSESSMENT:  CLINICAL IMPRESSION:  Continued to work on strength progressions and overhead activity for work related tasks. Good effort and tolerance noted to this today.    EVAL: Patient is a 53 y.o. M who was seen today for physical therapy evaluation and treatment for  Diagnosis  M19.011 (ICD-10-CM) - Arthritis of right acromioclavicular joint  S46.811D (ICD-10-CM) - Partial tear of right subscapularis tendon, subsequent encounter  . He received surgery as noted above on 10/16/23, also had subscapularis repair which at this point remains the most limiting part of his protocol. MD did not send a formal post-op protocol so used subscap repair protocol from [employer]. Will challenge him as appropriate and able as per protocol and postop restrictions.   OBJECTIVE IMPAIRMENTS: decreased mobility, decreased ROM, decreased strength, increased fascial restrictions, increased muscle spasms, impaired UE functional use, improper body mechanics, postural  dysfunction, obesity, and pain.   ACTIVITY LIMITATIONS: carrying, lifting, continence, bathing, toileting, dressing, reach over head, hygiene/grooming, and caring for others  PARTICIPATION LIMITATIONS: meal prep, cleaning, laundry, driving, shopping, community activity, occupation, and yard work  PERSONAL FACTORS: Age, Behavior pattern, Education, Fitness, Past/current experiences, Profession, Social background, and Time since onset of injury/illness/exacerbation are also affecting patient's functional outcome.   REHAB POTENTIAL: Good  CLINICAL DECISION MAKING: Stable/uncomplicated  EVALUATION COMPLEXITY: Low   GOALS: Goals reviewed with patient? No  SHORT TERM GOALS: Target date: 12/28/2023    Patient will be compliant with appropriate progressive HEP        GOAL STATUS: MET 12/25/23  2. R  shoulder flexion and scaption AROM to be at least 150*         GOAL STATUS: ONGOING 12/25/23   3. R shoulder ER and IR A/PROM to be at least 45* at 45* ABD           GOAL STATUS: MET 12/25/23   4. Will be more aware of posture with all functional tasks with use of ergonomic aides PRN/as desired      GOAL STATUS: ONGOING 12/25/23   LONG TERM GOALS: Target date: 01/25/2024    MMT to have improved by at least one grade in all weak groups       GOAL STATUS: Initial    2. AROM to have normalized and will be pain free all planes of motion      GOAL STATUS: Initial    3. Pain to be no more than 2/10 with all functional tasks     GOAL STATUS: Initial   4. 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 PSFS  to have improved by at least 3 points to show improved QOL and subjective perception of condition      GOAL STATUS: Initial    PLAN:  PT FREQUENCY: 2x/week  PT DURATION: 8 weeks  PLANNED INTERVENTIONS: 97164- PT Re-evaluation,  97750- Physical Performance Testing, 97110-Therapeutic exercises, 97530- Therapeutic activity, [id number]- Neuromuscular re-education, 97535- Self Care, 02859- Manual therapy, [id number]- Electrical stimulation (manual), [id number]- Vasopneumatic device, [id number]- Ultrasound, 02966- Ionotophoresis 4mg /ml Dexamethasone , and Patient/Family education  PLAN FOR NEXT SESSION:   strength as tolerated, and gradual overhead activity as he wants to return to work as personnel officer.   [email or web address]  Keep head elevated in supine, no bending forward- eye needs to be above waist per pt; having cataract surgery on other eye 01/19/24  Redell JONELLE Moose, PT, DPT 01/17/24 11:10 AM

## 2024-01-19 ENCOUNTER — Ambulatory Visit (HOSPITAL_COMMUNITY): Admission: RE | Admit: 2024-01-19 | Discharge: 2024-01-19 | Disposition: A | Attending: Optometry | Admitting: Optometry

## 2024-01-19 ENCOUNTER — Ambulatory Visit (HOSPITAL_COMMUNITY): Admitting: Anesthesiology

## 2024-01-19 ENCOUNTER — Encounter (HOSPITAL_COMMUNITY): Admission: RE | Disposition: A | Payer: Self-pay | Source: Home / Self Care | Attending: Optometry

## 2024-01-19 ENCOUNTER — Ambulatory Visit: Admitting: Physical Therapy

## 2024-01-19 DIAGNOSIS — I2510 Atherosclerotic heart disease of native coronary artery without angina pectoris: Secondary | ICD-10-CM | POA: Diagnosis not present

## 2024-01-19 DIAGNOSIS — F17210 Nicotine dependence, cigarettes, uncomplicated: Secondary | ICD-10-CM | POA: Diagnosis not present

## 2024-01-19 DIAGNOSIS — H2511 Age-related nuclear cataract, right eye: Secondary | ICD-10-CM | POA: Diagnosis present

## 2024-01-19 SURGERY — PHACOEMULSIFICATION, CATARACT, WITH IOL INSERTION
Anesthesia: Monitor Anesthesia Care | Site: Eye | Laterality: Right

## 2024-01-19 MED ORDER — BSS IO SOLN
INTRAOCULAR | Status: DC | PRN
  Administered 2024-01-19: 15 mL via INTRAOCULAR

## 2024-01-19 MED ORDER — SODIUM CHLORIDE 0.9% FLUSH
INTRAVENOUS | Status: DC | PRN
  Administered 2024-01-19: 10 mL via INTRAVENOUS

## 2024-01-19 MED ORDER — MIDAZOLAM HCL 2 MG/2ML IJ SOLN
INTRAMUSCULAR | Status: AC
  Filled 2024-01-19: qty 2

## 2024-01-19 MED ORDER — MOXIFLOXACIN HCL 5 MG/ML IO SOLN
INTRAOCULAR | Status: DC | PRN
  Administered 2024-01-19: .3 mL via INTRACAMERAL

## 2024-01-19 MED ORDER — POVIDONE-IODINE 5 % OP SOLN
OPHTHALMIC | Status: DC | PRN
  Administered 2024-01-19: 1 via OPHTHALMIC

## 2024-01-19 MED ORDER — TROPICAMIDE 1 % OP SOLN
1.0000 [drp] | OPHTHALMIC | Status: AC
  Administered 2024-01-19 (×3): 1 [drp] via OPHTHALMIC

## 2024-01-19 MED ORDER — LIDOCAINE HCL 3.5 % OP GEL
1.0000 | Freq: Once | OPHTHALMIC | Status: AC
  Administered 2024-01-19: 1 via OPHTHALMIC

## 2024-01-19 MED ORDER — SIGHTPATH DOSE#1 NA HYALUR & NA CHOND-NA HYALUR IO KIT
PACK | INTRAOCULAR | Status: DC | PRN
  Administered 2024-01-19: 1 via OPHTHALMIC

## 2024-01-19 MED ORDER — STERILE WATER FOR IRRIGATION IR SOLN
Status: DC | PRN
  Administered 2024-01-19: 1

## 2024-01-19 MED ORDER — PHENYLEPHRINE-KETOROLAC 1-0.3 % IO SOLN
INTRAOCULAR | Status: DC | PRN
  Administered 2024-01-19: 500 mL via OPHTHALMIC

## 2024-01-19 MED ORDER — TETRACAINE HCL 0.5 % OP SOLN
1.0000 [drp] | OPHTHALMIC | Status: AC
  Administered 2024-01-19 (×3): 1 [drp] via OPHTHALMIC

## 2024-01-19 MED ORDER — LIDOCAINE HCL (PF) 1 % IJ SOLN
INTRAMUSCULAR | Status: DC | PRN
  Administered 2024-01-19: 1 mL

## 2024-01-19 MED ORDER — PHENYLEPHRINE HCL 2.5 % OP SOLN
1.0000 [drp] | OPHTHALMIC | Status: AC
  Administered 2024-01-19 (×3): 1 [drp] via OPHTHALMIC

## 2024-01-19 MED ORDER — LACTATED RINGERS IV SOLN: INTRAVENOUS | Status: DC

## 2024-01-19 MED ADMIN — Midazolam HCl Inj 5 MG/5ML (Base Equivalent): 2 mg | INTRAVENOUS | NDC 23155060031

## 2024-01-19 SURGICAL SUPPLY — 11 items
CLOTH BEACON ORANGE TIMEOUT ST (SAFETY) ×1 IMPLANT
DRSG TEGADERM 4X4.75 (GAUZE/BANDAGES/DRESSINGS) ×1 IMPLANT
EYE SHIELD UNIVERSAL CLEAR (GAUZE/BANDAGES/DRESSINGS) IMPLANT
FEE CATARACT SUITE SIGHTPATH (MISCELLANEOUS) ×1 IMPLANT
GLOVE BIOGEL PI IND STRL 7.0 (GLOVE) ×2 IMPLANT
LENS IOL TECNIS EYHANCE 23.5 (Intraocular Lens) IMPLANT
NDL HYPO 18GX1.5 BLUNT FILL (NEEDLE) ×1 IMPLANT
PAD ARMBOARD POSITIONER FOAM (MISCELLANEOUS) ×1 IMPLANT
SYR TB 1ML LL NO SAFETY (SYRINGE) ×1 IMPLANT
TAPE SURG TRANSPORE 1 IN (GAUZE/BANDAGES/DRESSINGS) IMPLANT
WATER STERILE IRR 250ML POUR (IV SOLUTION) ×1 IMPLANT

## 2024-01-19 NOTE — Discharge Instructions (Signed)
 Please discharge patient when stable, will follow up today with Dr. Ilsa Iha at the [facility] office immediately following discharge.  Leave shield in place until visit.  All paperwork with discharge instructions will be given at the office.  [facility] Address:  [address]  [place], [place] [address]  Dr. Chaya Jan Phone: [phone]

## 2024-01-19 NOTE — Anesthesia Postprocedure Evaluation (Signed)
 Anesthesia Post Note  Patient: Derrick Turner  Procedure(s) Performed: PHACOEMULSIFICATION, CATARACT, WITH IOL INSERTION (Right: Eye)  Patient location during evaluation: Short Stay Anesthesia Type: MAC Level of consciousness: awake and alert Pain management: pain level controlled Vital Signs Assessment: post-procedure vital signs reviewed and stable Respiratory status: spontaneous breathing Cardiovascular status: stable Postop Assessment: no apparent nausea or vomiting Anesthetic complications: no   No notable events documented.   Last Vitals:  Vitals:   01/19/24 0639 01/19/24 0757  BP: 129/79 128/79  Pulse: 69 71  Resp:  12  Temp: 36.4 C 36.5 C  SpO2: 98% 96%    Last Pain:  Vitals:   01/19/24 0757  TempSrc: Oral  PainSc: 2                  Lio Wehrly

## 2024-01-19 NOTE — Anesthesia Preprocedure Evaluation (Signed)
 Anesthesia Evaluation  Patient identified by MRN, date of birth, ID band Patient awake    Reviewed: Allergy & Precautions, H&P , NPO status , Patient's Chart, lab work & pertinent test results, reviewed documented beta blocker date and time   Airway Mallampati: II  TM Distance: >3 FB Neck ROM: full    Dental no notable dental hx.    Pulmonary neg pulmonary ROS, Current Smoker and Patient abstained from smoking.   Pulmonary exam normal breath sounds clear to auscultation       Cardiovascular Exercise Tolerance: Good hypertension, + CAD and + Past MI  + dysrhythmias  Rhythm:regular Rate:Normal     Neuro/Psych  PSYCHIATRIC DISORDERS Anxiety      Neuromuscular disease    GI/Hepatic Neg liver ROS,GERD  ,,  Endo/Other  negative endocrine ROS    Renal/GU Renal disease  negative genitourinary   Musculoskeletal   Abdominal   Peds  Hematology negative hematology ROS (+)   Anesthesia Other Findings   Reproductive/Obstetrics negative OB ROS                              Anesthesia Physical Anesthesia Plan  ASA: 3  Anesthesia Plan: MAC   Post-op Pain Management:    Induction:   PONV Risk Score and Plan:   Airway Management Planned:   Additional Equipment:   Intra-op Plan:   Post-operative Plan:   Informed Consent: I have reviewed the patients History and Physical, chart, labs and discussed the procedure including the risks, benefits and alternatives for the proposed anesthesia with the patient or authorized representative who has indicated his/her understanding and acceptance.     Dental Advisory Given  Plan Discussed with: CRNA  Anesthesia Plan Comments:         Anesthesia Quick Evaluation

## 2024-01-19 NOTE — Interval H&P Note (Signed)
 History and Physical Interval Note:  01/19/2024 7:16 AM  [name]  has presented today for surgery, with the diagnosis of nuclear sclerotic cataract, right eye.  The various methods of treatment have been discussed with the patient and family. After consideration of risks, benefits and other options for treatment, the patient has consented to  Procedures: PHACOEMULSIFICATION, CATARACT, WITH IOL INSERTION (Right) as a surgical intervention.  The patient's history has been reviewed, patient examined, no change in status, stable for surgery.  I have reviewed the patient's chart and labs.  Questions were answered to the patient's satisfaction.     Derrick Turner

## 2024-01-19 NOTE — Anesthesia Postprocedure Evaluation (Signed)
 Anesthesia Post Note  Patient: Derrick Turner  Procedure(s) Performed: PHACOEMULSIFICATION, CATARACT, WITH IOL INSERTION (Right: Eye)  Anesthetic complications: no   No notable events documented.   Last Vitals:  Vitals:   01/19/24 0639  BP: 129/79  Pulse: 69  Temp: 36.4 C  SpO2: 98%    Last Pain:  Vitals:   01/19/24 0639  TempSrc: Oral                 Tabitha Riggins

## 2024-01-19 NOTE — Transfer of Care (Signed)
 Immediate Anesthesia Transfer of Care Note  Patient: Derrick Turner  Procedure(s) Performed: PHACOEMULSIFICATION, CATARACT, WITH IOL INSERTION (Right: Eye)  Patient Location: Short Stay  Anesthesia Type:General  Level of Consciousness: awake  Airway & Oxygen Therapy: Patient Spontanous Breathing  Post-op Assessment: Report given to RN  Post vital signs: Reviewed  Last Vitals:  Vitals Value Taken Time  BP    Temp    Pulse    Resp    SpO2      Last Pain:  Vitals:   01/19/24 0639  TempSrc: Oral         Complications: No notable events documented.

## 2024-01-19 NOTE — Op Note (Signed)
 Date of procedure: 01/19/2024  Pre-operative diagnosis: Visually significant age-related nuclear cataract, Right Eye (H25.11)  Post-operative diagnosis: Visually significant age-related nuclear cataract, Right Eye H25.11  Procedure: Removal of cataract via phacoemulsification and insertion of intra-ocular lens J&J DIBOO +23.5D into the capsular bag of the Right Eye  Attending surgeon: Marsa Cleverly, MD  Anesthesia: MAC, Topical Akten   Complications: None  Estimated Blood Loss: <28mL (minimal)  Specimens: None  Implants:  Implant Name Type Inv. Item Serial No. Manufacturer Lot No. LRB No. Used Action  LENS IOL TECNIS EYHANCE 23.5 - [id number] Intraocular Lens LENS IOL TECNIS EYHANCE 23.5 [id number] [employer]  Right 1 Implanted    Indications:  Visually significant age-related cataract, Right Eye  Procedure:  The patient was seen and identified in the pre-operative area. The operative eye was identified and dilated.  The operative eye was marked.  Topical anesthesia was administered to the operative eye.     The patient was then to the operative suite and placed in the supine position.  A timeout was performed confirming the patient, procedure to be performed, and all other relevant information.   The patient's face was prepped and draped in the usual fashion for intra-ocular surgery.  A lid speculum was placed into the operative eye and the surgical microscope moved into place and focused.  A superotemporal paracentesis was created using a 20 gauge paracentesis blade.  BSS mixed with Omidria , followed by 1% lidocaine  was injected into the anterior chamber.  Viscoelastic was injected into the anterior chamber.  A temporal clear-corneal main wound incision was created using a 2.46mm microkeratome.  A continuous curvilinear capsulorrhexis was initiated using an irrigating cystitome and completed using capsulorrhexis forceps.  Hydrodissection and hydrodeliniation were performed.   Viscoelastic was injected into the anterior chamber.  A phacoemulsification handpiece and a chopper as a second instrument were used to remove the nucleus and epinucleus. The irrigation/aspiration handpiece was used to remove any remaining cortical material.   The capsular bag was reinflated with viscoelastic, checked, and found to be intact.  The intraocular lens was inserted into the capsular bag.  The irrigation/aspiration handpiece was used to remove any remaining viscoelastic.  The clear corneal wound and paracentesis wounds were then hydrated and checked with Weck-Cels to be watertight. Moxifloxacin  was instilled into the anterior chamber.  The lid-speculum and drape were removed. The patient's face was cleaned with a wet and dry 4x4. A clear shield was taped over the eye. The patient was taken to the post-operative care unit in good condition, having tolerated the procedure well.  Post-Op Instructions: The patient will follow up at [facility] for a same day post-operative evaluation and will receive all other orders and instructions.

## 2024-01-22 ENCOUNTER — Encounter (HOSPITAL_COMMUNITY): Payer: Self-pay | Admitting: Optometry

## 2024-01-31 ENCOUNTER — Encounter: Payer: Self-pay | Admitting: Physical Therapy

## 2024-01-31 ENCOUNTER — Ambulatory Visit: Admitting: Physical Therapy

## 2024-01-31 DIAGNOSIS — M6281 Muscle weakness (generalized): Secondary | ICD-10-CM

## 2024-01-31 DIAGNOSIS — M25511 Pain in right shoulder: Secondary | ICD-10-CM

## 2024-01-31 DIAGNOSIS — M25611 Stiffness of right shoulder, not elsewhere classified: Secondary | ICD-10-CM

## 2024-01-31 NOTE — Therapy (Signed)
 " OUTPATIENT PHYSICAL THERAPY SHOULDER TREATMENT    Patient Name: Derrick Turner MRN: [id number] DOB:03-06-1970, 53 y.o., male Today's Date: 01/31/2024  END OF SESSION:  PT End of Session - 01/31/24 0806     Visit Number 12    Number of Visits 17    Date for Recertification  01/25/24    Authorization Type [employer]    PT Start Time 0800    PT Stop Time 0845    PT Time Calculation (min) 45 min    Activity Tolerance Patient tolerated treatment well    Behavior During Therapy [facility] for tasks assessed/performed            Past Medical History:  Diagnosis Date   Arthritis    Coronary artery disease cardiologist--- dr croitoru   ETT 04-01-2016 in epic, reproduced chest pain but no EKG changes;  cardiac cath 04-06-2016 moderate diffuse nonobstructive disease, aggressive medical management   Gout    High cholesterol    History of 2019 novel coronavirus disease (COVID-19) 03/02/2020   per pt tested positive covid, copy of result in epic, stated mild symptoms that resolved   History of kidney stones    Incomplete right bundle branch block    Myocardial infarction [facility])    Right ureteral calculus    Wears glasses    Past Surgical History:  Procedure Laterality Date   APPENDECTOMY  teen   BACK SURGERY     two   BIOPSY  08/11/2020   Procedure: BIOPSY;  Surgeon: Cindie Carlin POUR, DO;  [facility]: [facility];  Service: Endoscopy;;   CATARACT EXTRACTION W/PHACO Left 12/01/2023   Procedure: PHACOEMULSIFICATION, CATARACT, WITH IOL INSERTION;  Surgeon: Juli Blunt, MD;  Location: [facility];  Service: Ophthalmology;  Laterality: Left;  CDE: 7.56   CATARACT EXTRACTION W/PHACO Right 01/19/2024   Procedure: PHACOEMULSIFICATION, CATARACT, WITH IOL INSERTION;  Surgeon: Juli Blunt, MD;  Location: [facility];  Service: Ophthalmology;  Laterality: Right;  CDE 3.29   COLONOSCOPY WITH PROPOFOL  N/A 08/11/2020   Procedure: COLONOSCOPY WITH PROPOFOL ;  Surgeon: Cindie Carlin POUR, DO;   [facility]: [facility];  Service: Endoscopy;  Laterality: N/A;  7:30am   CORONARY/GRAFT ACUTE MI REVASCULARIZATION N/A 07/16/2021   Procedure: Coronary/Graft Acute MI Revascularization;  Surgeon: Verlin Lonni BIRCH, MD;  Location: [facility];  Service: Cardiovascular;  Laterality: N/A;   CYSTOSCOPY WITH RETROGRADE PYELOGRAM, URETEROSCOPY AND STENT PLACEMENT Right 04/14/2020   Procedure: CYSTOSCOPY WITH RETROGRADE PYELOGRAM, URETEROSCOPY AND STENT PLACEMENT;  Surgeon: Rosalind Zachary NOVAK, MD;  Location: [facility];  Service: Urology;  Laterality: Right;   EXTRACORPOREAL SHOCK WAVE LITHOTRIPSY Right 03/23/2020   Procedure: EXTRACORPOREAL SHOCK WAVE LITHOTRIPSY (ESWL);  Surgeon: Carolee Sherwood BIRCH DOUGLAS, MD;  Location: [facility];  Service: Urology;  Laterality: Right;   HOLMIUM LASER APPLICATION Right 04/14/2020   Procedure: HOLMIUM LASER APPLICATION;  Surgeon: Rosalind Zachary NOVAK, MD;  Location: [facility];  Service: Urology;  Laterality: Right;   LAPAROSCOPIC CHOLECYSTECTOMY  2012 approx   LEFT HEART CATH AND CORONARY ANGIOGRAPHY N/A 04/06/2016   Procedure: Left Heart Cath and Coronary Angiography;  Surgeon: Ozell Fell, MD;  Location: [facility] INVASIVE CV LAB;  Service: Cardiovascular;  Laterality: N/A;   LEFT HEART CATH AND CORONARY ANGIOGRAPHY N/A 07/16/2021   Procedure: LEFT HEART CATH AND CORONARY ANGIOGRAPHY;  Surgeon: Verlin Lonni BIRCH, MD;  Location: [facility];  Service: Cardiovascular;  Laterality: N/A;   OPEN SUBSCAPULARIS REPAIR Right 10/16/2023  Procedure: REPAIR, SHOULDER, SUBSCAPULARIS;  Surgeon: Onesimo Oneil LABOR, MD;  Location: [facility];  Service: Orthopedics;  Laterality: Right;   ORCHIECTOMY Right 1990s   per pt benign cyst   PARTIAL COLECTOMY Right 09/02/2020   Procedure: HEMICOLECTOMY;  Surgeon: Mavis Oneil, MD;  Location: [facility];  Service: General;  Laterality: Right;   POLYPECTOMY  08/11/2020   Procedure:  POLYPECTOMY;  Surgeon: Cindie Carlin POUR, DO;  [facility];  Service: Endoscopy;;   SHOULDER ARTHROSCOPY Right 10/16/2023   Procedure: ARTHROSCOPY, SHOULDER;  Surgeon: Onesimo Oneil LABOR, MD;  Location: [facility];  Service: Orthopedics;  Laterality: Right;  Distal clavicle excision   SHOULDER SURGERY Bilateral left 2019;  right 2016   arthroscopy   SHOULDER SURGERY Left    SHOULDER SURGERY Right    Patient Active Problem List   Diagnosis Date Noted   Diarrhea 06/30/2023   Elevated BP without diagnosis of hypertension 02/28/2023   Right hand pain 01/27/2023   Family history of prostate cancer in father 11/04/2022   Hematuria, gross 11/01/2022   Arthralgia 10/28/2022   Anxiety 06/10/2022   Generalized muscle ache 06/10/2022   Nephrolithiasis 04/05/2022   Lesion of left ear 11/05/2021   Tinnitus, bilateral 08/04/2021   Acute MI, 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HCC)    S/P partial colectomy 09/02/2020   Dysplastic colon polyp    Cigarette nicotine  dependence without complication 09/26/2017   Chronic right-sided low back pain with right-sided sciatica 09/26/2017   Mixed hyperlipidemia 09/26/2017   CAD (coronary artery disease) 05/03/2016   Tobacco abuse    Prediabetes    Gastroesophageal reflux disease    ACS (acute coronary syndrome) (HCC) 03/31/2016   Pelvic floor dysfunction 01/14/2016   Urinary hesitancy 12/21/2015    PCP: Edman Das FNP   REFERRING PROVIDER: Onesimo Oneil LABOR, MD  REFERRING DIAG:  Diagnosis  M19.011 (ICD-10-CM) - Arthritis of right acromioclavicular joint  S46.811D (ICD-10-CM) - Partial tear of right subscapularis tendon, subsequent encounter    THERAPY DIAG:  Acute pain of right shoulder  Stiffness of right shoulder, not elsewhere classified  Muscle weakness (generalized)  Rationale for Evaluation and Treatment: Rehabilitation  ONSET DATE: s/p R shoulder SAD/DCE, rotator cuff repair, subscap repair 10/16/23  SUBJECTIVE:                                                                                                                                                                                       SUBJECTIVE STATEMENT:  Reports he had a successful cataract surgery. His shoulder has been doing good.   EVAL: Surgery was a little more than I thought it was gonna be. Shoulder feels like frozen gel.  Its very stiff without much movement, just got out of the sling last Friday. Doctor gave me pendulums and  that's about it. Had some odd complications after a trigger finger surgery last year, had to see a lot of rheumatologists to r/o autoimmune disease and recover overall.    Hand dominance: Left  PERTINENT HISTORY: See above   PAIN:  Are you having pain? Yes 1/10 but more soreness than pain in his right shoulder  PRECAUTIONS: Shoulder  RED FLAGS: None   WEIGHT BEARING RESTRICTIONS: No  FALLS:  Has patient fallen in last 6 months? 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   PATIENT SURVEYS:  PSFS: THE PATIENT SPECIFIC FUNCTIONAL SCALE  Place score of 0-10 (0 = unable to perform activity and 10 = able to perform activity at the same level as before injury or problem)  Activity Date: 11/30/23 12/25/23   Working as an personnel officer  0 2   2. Reaching [place]  0 3   3. Putting on belt  5 9   4.      Total Score 1.7 4.7     Total Score = Sum of activity scores/number of activities  Minimally Detectable Change: 3 points (for single activity); 2 points (for average score)  [facility] (nd). The Patient Specific Functional Scale . Retrieved from  [email or web address]   COGNITION: Overall cognitive status: Within functional limits for tasks assessed       POSTURE: Forward head, rounded shoulders   UPPER EXTREMITY ROM:    ROM Right eval Right 12/07/23 Right 12/20/23 Right 12/25/23 supine  Right 01/08/24 Standing  Shoulder flexion PROM 110-120* pain, AROM supine 100-110* pain PROM 150* PROM 170 AROM 145* AROM 160  Shoulder extension       Shoulder scaption  PROM 110-120*,  PROM 150* PROM 160 AROM 138* AROM 160  Shoulder adduction       Shoulder internal rotation Hand to belly at 0* ABD    At about 45* ABD, approximately 70* AROM  WNL  Shoulder external rotation 0-10* at 0* ABD supine PROM; able to get to neutral AROM At 0* ABD, 20-30* PROM  At about 45* ABD, approximately 60* AROM 85  Elbow flexion       Elbow extension       Wrist flexion       Wrist extension       Wrist ulnar deviation       Wrist radial deviation       Wrist pronation       Wrist supination       (Blank rows = not tested)  UPPER EXTREMITY MMT:  MMT Right eval Left eval Right 12/25/23 Right 01/08/24  Shoulder flexion   3 4-  Shoulder extension      Shoulder abduction   3 4-  Shoulder adduction      Shoulder internal rotation   4 5  Shoulder external rotation   4 4  Middle trapezius      Lower trapezius      Elbow flexion      Elbow extension      Wrist  flexion      Wrist extension      Wrist ulnar deviation      Wrist radial deviation      Wrist pronation      Wrist supination      Grip strength (lbs)      (Blank rows = not tested)  DNT MMT at eval due to pain with basic ROM at eval, functional MMT for flexion and ER approximately 3/5 based on AROM testing    PALPATION:  TTP anterior shoulder                                                                                                                              TREATMENT DATE:  01/31/24 UBE L4 x 4 min fwd/4 min bwd Wall  ladder X 10 reps flexion, X 10 reps scaption, holding 5 sec Standing forward press at pulleys 2.5# 2x10 Standing row at pulleys 22# 2x10 each low, middle, and high Standing bicep curl at pulleys 7.5# 2 X 10 Standing push down at pulleys down 7.5# x10 each Standing IR at pulleys 2.5# 2X10 Standing ER at pulleys 2.5# 2X10 Standing on bench, feeding stretch out strap through pull up handles to simulate running electric wire, 2 times to left, 2 times to right Standing lifting 2.5# plate overhead to top weight rack on squat rack, then taking them back down, 3 reps of this.  Gripping weight collar, and lifting it to place on top hook of squat rack, 3 reps of 4 collars  Standing shoulder abduction bilat 2# 2X10   01/17/24 UBE L4 x 4 min fwd/4 min bwd Wall ladder X 10 reps flexion, X 10 reps scaption, holding 5 sec Standing forward press at pulleys 2.5# 2x10 each Standing row at pulleys 22# 2x10 each low, middle, and high Standing bicep curl at pulleys 7.5# X 10 Standing push down at pulleys down 7.5# x10 each Standing on bench, feeding stretch out strap through pull up handles to simulate running electric wire Standing on airex beam, lift 2,3,4# up to top cabinet shelf then bring all down, then repeat for 2nd set using Rt UE only.  Standing shoulder flexion bilat 2# 2X10 Standing shoulder abduction bilat 2# 2X10 Standing shoulder press bilat 2# 2X10    01/10/24 UBE L4 x 4 min fwd/4 min bwd Standing on airex beam, lift 3# up to top cabinet shelf 2x10 Standing on airex beam, lifting 3# up to top cabinet shelf, sliding into horizontal abd/add 2x10 Standing on airex beam, Y holding on bodyblade  Standing on airex beam, shoulder flexion bodyblade up/down at different heights x30 each Standing on airex beam, shoulder flexion bodyblade L<>R at different heights x30 each Standing row at pulleys 22# 2x10 each low, middle, and high Standing push down at pulleys down 17# 2x10 each Standing  forward press at pulleys 2.5# 2x10 each     PATIENT EDUCATION: Education details: exam findings, POC, HEP, post-op limitations from subscap repair (  most conservative part of his surgery/slowest recovering) Person educated: Patient Education method: Explanation, Demonstration, Verbal cues, and Handouts Education comprehension: verbalized understanding, returned demonstration, and needs further education  HOME EXERCISE PROGRAM:   Access Code: [email or web address] URL: [email or web address] Date: 11/30/2023 Prepared by: Josette Rough  Exercises - Supine Shoulder Flexion Extension AAROM with Dowel  - 2-3 x daily - 7 x weekly - 1 sets - 10 reps - 3 seconds  hold - Supine Shoulder Press AAROM in Abduction with Dowel  - 2-3 x daily - 7 x weekly - 1 sets - 10 reps - Seated Shoulder External Rotation AAROM with Cane and Hand in Neutral  - 2-3 x daily - 7 x weekly - 1 sets - 10 reps - 3 seconds  hold - Shoulder Flexion Wall Slide with Towel  - 2-3 x daily - 7 x weekly - 1 sets - 10 reps - 3 seconds  hold - Seated Scapular Retraction  - 2-3 x daily - 7 x weekly - 1 sets - 10 reps - Seated Backward Shoulder Rolls  - 2-3 x daily - 7 x weekly - 1 sets - 10 reps Added 12/27/23 (he denies needing pictures of these) Scapular retractions red x15 Shoulder extensions red x15 Shoulder IR red X 10 Shoulder ER red X 10 Bicep curl red X 15 Tricep extension red X 15 Wall push ups (scap protraction) X 10  ASSESSMENT:  CLINICAL IMPRESSION:  Overall doing well with his shoulder rehab. 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    EVAL: Patient is a 53 y.o. M who was seen today for physical therapy evaluation and treatment for  Diagnosis  M19.011 (ICD-10-CM) - Arthritis of right acromioclavicular joint  S46.811D (ICD-10-CM) - Partial tear of right subscapularis tendon, subsequent  encounter  . He received surgery as noted above on 10/16/23, also had subscapularis repair which at this point remains the most limiting part of his protocol. MD did not send a formal post-op protocol so used subscap repair protocol from [employer]. Will challenge him as appropriate and able as per protocol and postop restrictions.   OBJECTIVE IMPAIRMENTS: decreased mobility, decreased ROM, decreased strength, increased fascial restrictions, increased muscle spasms, impaired UE functional use, improper body mechanics, postural dysfunction, obesity, and pain.   ACTIVITY LIMITATIONS: carrying, lifting, continence, bathing, toileting, dressing, reach over head, hygiene/grooming, and caring for others  PARTICIPATION LIMITATIONS: meal prep, cleaning, laundry, driving, shopping, community activity, occupation, and yard work  PERSONAL FACTORS: Age, Behavior pattern, Education, Fitness, Past/current experiences, Profession, Social background, and Time since onset of injury/illness/exacerbation are also affecting patient's functional outcome.   REHAB POTENTIAL: Good  CLINICAL DECISION MAKING: Stable/uncomplicated  EVALUATION COMPLEXITY: Low   GOALS: Goals reviewed with patient? No  SHORT TERM GOALS: Target date: 12/28/2023    Patient will be compliant with appropriate progressive HEP        GOAL STATUS: MET 12/25/23  2. R  shoulder flexion and scaption AROM to be at least 150*         GOAL STATUS: MET 01/31/24   3. R shoulder ER and IR A/PROM to be at least 45* at 45* ABD           GOAL STATUS: MET 12/25/23   4. Will be more aware of posture with all functional tasks with use of ergonomic aides PRN/as desired  GOAL STATUS: MET 01/31/24   LONG TERM GOALS: Target date: 01/25/2024    MMT to have improved by at least one grade in all weak groups       GOAL STATUS: Initial    2. AROM to have normalized and will be pain free all planes of motion      GOAL STATUS: Initial     3. Pain to be no more than 2/10 with all functional tasks     GOAL STATUS: Initial   4. Will be able to perform all functional household and work based tasks without increase from resting pain levels including [place] work for his job as an personnel officer      GOAL STATUS: Initial   5. PSFS  to have improved by at least 3 points to show improved QOL and subjective perception of condition      GOAL STATUS: Initial    PLAN:  PT FREQUENCY: 2x/week  PT DURATION: 8 weeks  PLANNED INTERVENTIONS: 97164- PT Re-evaluation, 97750- Physical Performance Testing, 97110-Therapeutic exercises, 97530- Therapeutic activity, [id number]- Neuromuscular re-education, 97535- Self Care, 02859- Manual therapy, [id number]- Electrical stimulation (manual), [id number]- Vasopneumatic device, [id number]- Ultrasound, 02966- Ionotophoresis 4mg /ml Dexamethasone , and Patient/Family education  PLAN FOR NEXT SESSION:   strength as tolerated, and work toward to return to work as personnel officer over remaining visits.   [email or web address]  Keep head elevated in supine, no bending forward- eye needs to be above waist per pt; had cataract  01/19/24  Redell JONELLE Moose, PT, DPT 01/31/2024 8:43 AM     "

## 2024-02-06 ENCOUNTER — Encounter: Payer: Self-pay | Admitting: Physical Therapy

## 2024-02-06 ENCOUNTER — Ambulatory Visit: Admitting: Physical Therapy

## 2024-02-06 DIAGNOSIS — M25611 Stiffness of right shoulder, not elsewhere classified: Secondary | ICD-10-CM

## 2024-02-06 DIAGNOSIS — M25511 Pain in right shoulder: Secondary | ICD-10-CM

## 2024-02-06 DIAGNOSIS — M6281 Muscle weakness (generalized): Secondary | ICD-10-CM

## 2024-02-06 NOTE — Therapy (Signed)
 " OUTPATIENT PHYSICAL THERAPY SHOULDER TREATMENT    Patient Name: Derrick Turner MRN: [id number] DOB:1970-12-16, 53 y.o., male Today's Date: 02/06/2024  END OF SESSION:  PT End of Session - 02/06/24 1020     Visit Number 13    Number of Visits 17    Date for Recertification  01/25/24    Authorization Type [employer]    PT Start Time 1010    PT Stop Time 1050    PT Time Calculation (min) 40 min    Activity Tolerance Patient tolerated treatment well    Behavior During Therapy [employer] for tasks assessed/performed            Past Medical History:  Diagnosis Date   Arthritis    Coronary artery disease cardiologist--- dr croitoru   ETT 04-01-2016 in epic, reproduced chest pain but no EKG changes;  cardiac cath 04-06-2016 moderate diffuse nonobstructive disease, aggressive medical management   Gout    High cholesterol    History of 2019 novel coronavirus disease (COVID-19) 03/02/2020   per pt tested positive covid, copy of result in epic, stated mild symptoms that resolved   History of kidney stones    Incomplete right bundle branch block    Myocardial infarction [facility])    Right ureteral calculus    Wears glasses    Past Surgical History:  Procedure Laterality Date   APPENDECTOMY  teen   BACK SURGERY     two   BIOPSY  08/11/2020   Procedure: BIOPSY;  Surgeon: Cindie Carlin POUR, DO;  [facility]: [facility];  Service: Endoscopy;;   CATARACT EXTRACTION W/PHACO Left 12/01/2023   Procedure: PHACOEMULSIFICATION, CATARACT, WITH IOL INSERTION;  Surgeon: Juli Blunt, MD;  Location: [facility];  Service: Ophthalmology;  Laterality: Left;  CDE: 7.56   CATARACT EXTRACTION W/PHACO Right 01/19/2024   Procedure: PHACOEMULSIFICATION, CATARACT, WITH IOL INSERTION;  Surgeon: Juli Blunt, MD;  Location: [facility];  Service: Ophthalmology;  Laterality: Right;  CDE 3.29   COLONOSCOPY WITH PROPOFOL  N/A 08/11/2020   Procedure: COLONOSCOPY WITH PROPOFOL ;  Surgeon: Cindie Carlin POUR, DO;   [facility]: [facility];  Service: Endoscopy;  Laterality: N/A;  7:30am   CORONARY/GRAFT ACUTE MI REVASCULARIZATION N/A 07/16/2021   Procedure: Coronary/Graft Acute MI Revascularization;  Surgeon: Verlin Lonni BIRCH, MD;  Location: [facility];  Service: Cardiovascular;  Laterality: N/A;   CYSTOSCOPY WITH RETROGRADE PYELOGRAM, URETEROSCOPY AND STENT PLACEMENT Right 04/14/2020   Procedure: CYSTOSCOPY WITH RETROGRADE PYELOGRAM, URETEROSCOPY AND STENT PLACEMENT;  Surgeon: Rosalind Zachary NOVAK, MD;  Location: [facility];  Service: Urology;  Laterality: Right;   EXTRACORPOREAL SHOCK WAVE LITHOTRIPSY Right 03/23/2020   Procedure: EXTRACORPOREAL SHOCK WAVE LITHOTRIPSY (ESWL);  Surgeon: Carolee Sherwood BIRCH DOUGLAS, MD;  Location[facility];  Service: Urology;  Laterality: Right;   HOLMIUM LASER APPLICATION Right 04/14/2020   Procedure: HOLMIUM LASER APPLICATION;  Surgeon: Rosalind Zachary NOVAK, MD;  Location: [facility];  Service: Urology;  Laterality: Right;   LAPAROSCOPIC CHOLECYSTECTOMY  2012 approx   LEFT HEART CATH AND CORONARY ANGIOGRAPHY N/A 04/06/2016   Procedure: Left Heart Cath and Coronary Angiography;  Surgeon: Ozell Fell, MD;  Location: [facility] IN[facility] CV LAB;  Service: Cardiovascular;  Laterality: N/A;   LEFT HEART CATH AND CORONARY ANGIOGRAPHY N/A 07/16/2021   Procedure: LEFT HEART CATH AND CORONARY ANGIOGRAPHY;  Surgeon: Verlin Lonni BIRCH, MD;  Location: [facility];  Service: Cardiovascular;  Laterality: N/A;   OPEN SUBSCAPULARIS REPAIR Right 10/16/2023  Procedure: REPAIR, SHOULDER, SUBSCAPULARIS;  Surgeon: Onesimo Oneil LABOR, MD;  Location: [facility];  Service: Orthopedics;  Laterality: Right;   ORCHIECTOMY Right 1990s   per pt benign cyst   PARTIAL COLECTOMY Right 09/02/2020   Procedure: HEMICOLECTOMY;  Surgeon: Mavis Oneil, MD;  Location: [facility];  Service: General;  Laterality: Right;   POLYPECTOMY  08/11/2020   Procedure:  POLYPECTOMY;  Surgeon: Cindie Carlin POUR, DO;  [facility];  Service: Endoscopy;;   SHOULDER ARTHROSCOPY Right 10/16/2023   Procedure: ARTHROSCOPY, SHOULDER;  Surgeon: Onesimo Oneil LABOR, MD;  Location: [facility];  Service: Orthopedics;  Laterality: Right;  Distal clavicle excision   SHOULDER SURGERY Bilateral left 2019;  right 2016   arthroscopy   SHOULDER SURGERY Left    SHOULDER SURGERY Right    Patient Active Problem List   Diagnosis Date Noted   Diarrhea 06/30/2023   Elevated BP without diagnosis of hypertension 02/28/2023   Right hand pain 01/27/2023   Family history of prostate cancer in father 11/04/2022   Hematuria, gross 11/01/2022   Arthralgia 10/28/2022   Anxiety 06/10/2022   Generalized muscle ache 06/10/2022   Nephrolithiasis 04/05/2022   Lesion of left ear 11/05/2021   Tinnitus, bilateral 08/04/2021   Acute MI, 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HCC)    S/P partial colectomy 09/02/2020   Dysplastic colon polyp    Cigarette nicotine  dependence without complication 09/26/2017   Chronic right-sided low back pain with right-sided sciatica 09/26/2017   Mixed hyperlipidemia 09/26/2017   CAD (coronary artery disease) 05/03/2016   Tobacco abuse    Prediabetes    Gastroesophageal reflux disease    ACS (acute coronary syndrome) (HCC) 03/31/2016   Pelvic floor dysfunction 01/14/2016   Urinary hesitancy 12/21/2015    PCP: Edman Das FNP   REFERRING PROVIDER: Onesimo Oneil LABOR, MD  REFERRING DIAG:  Diagnosis  M19.011 (ICD-10-CM) - Arthritis of right acromioclavicular joint  S46.811D (ICD-10-CM) - Partial tear of right subscapularis tendon, subsequent encounter    THERAPY DIAG:  Acute pain of right shoulder  Stiffness of right shoulder, not elsewhere classified  Muscle weakness (generalized)  Rationale for Evaluation and Treatment: Rehabilitation  ONSET DATE: s/p R shoulder SAD/DCE, rotator cuff repair, subscap repair 10/16/23  SUBJECTIVE:                                                                                                                                                                                       SUBJECTIVE STATEMENT:  Reports shoulder is doing good, just weak still  EVAL: Surgery was a little more than I thought it was gonna be. Shoulder feels like frozen gel. Its very stiff without much movement,  just got out of the sling last Friday. Doctor gave me pendulums and  that's about it. Had some odd complications after a trigger finger surgery last year, had to see a lot of rheumatologists to r/o autoimmune disease and recover overall.    Hand dominance: Left  PERTINENT HISTORY: See above   PAIN:  Are you having pain? Yes 1/10 but more soreness than pain in his right shoulder  PRECAUTIONS: Shoulder  RED FLAGS: None   WEIGHT BEARING RESTRICTIONS: No  FALLS:  Has patient fallen in last 6 months? 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   PATIENT SURVEYS:  PSFS: THE PATIENT SPECIFIC FUNCTIONAL SCALE  Place score of 0-10 (0 = unable to perform activity and 10 = able to perform activity at the same level as before injury or problem)  Activity Date: 11/30/23 12/25/23   Working as an personnel officer  0 2   2. Reaching [place]  0 3   3. Putting on belt  5 9   4.      Total Score 1.7 4.7     Total Score = Sum of activity scores/number of activities  Minimally Detectable Change: 3 points (for single activity); 2 points (for average score)  [facility] (nd). The Patient Specific Functional Scale . Retrieved from  [email or web address]   COGNITION: Overall cognitive status: Within functional limits for tasks assessed       POSTURE: Forward head, rounded shoulders   UPPER EXTREMITY ROM:    ROM Right eval Right 12/07/23 Right 12/20/23 Right 12/25/23 supine  Right 01/08/24 Standing  Shoulder flexion PROM 110-120* pain, AROM supine 100-110* pain PROM 150* PROM 170 AROM 145* AROM 160  Shoulder extension       Shoulder scaption  PROM 110-120*,  PROM 150* PROM 160 AROM 138* AROM 160  Shoulder adduction       Shoulder internal rotation Hand to belly at 0* ABD    At about 45* ABD, approximately 70* AROM  WNL  Shoulder external rotation 0-10* at 0* ABD supine PROM; able to get to neutral AROM At 0* ABD, 20-30* PROM  At about 45* ABD, approximately 60* AROM 85  Elbow flexion       Elbow extension       Wrist flexion       Wrist extension       Wrist ulnar deviation       Wrist radial deviation       Wrist pronation       Wrist supination       (Blank rows = not tested)  UPPER EXTREMITY MMT:  MMT Right eval Left eval Right 12/25/23 Right 01/08/24  Shoulder flexion   3 4-  Shoulder extension      Shoulder abduction   3 4-  Shoulder adduction      Shoulder internal rotation   4 5  Shoulder external rotation   4 4  Middle trapezius      Lower trapezius      Elbow flexion      Elbow extension      Wrist flexion  Wrist extension      Wrist ulnar deviation      Wrist radial deviation      Wrist pronation      Wrist supination      Grip strength (lbs)      (Blank rows = not tested)  DNT MMT at eval due to pain with basic ROM at eval, functional MMT for flexion and ER approximately 3/5 based on AROM testing    PALPATION:  TTP anterior shoulder                                                                                                                              TREATMENT DATE:  02/06/24 UBE L4 x 4 min fwd/4 min bwd Wall  ladder X 10 reps flexion, X 10 reps scaption, holding 5 sec Standing forward press at pulleys 2.5# 2x10 Standing row at pulleys 22# 2x10 each low, middle, and high Standing bicep curl at pulleys 7.5# 2 X 10 Standing push down at pulleys down 7.5# x10 each Standing IR at pulleys 2.5# 2X10 Standing ER at pulleys 2.5# 2X10 Standing on bench, feeding stretch out strap through pull up handles to simulate running electric wire, 2 times to left, 2 times to right Standing lifting 2.5# plates and 5 # plates (4 plates at each weight) overhead to top weight rack on squat rack, then taking them back down, 3 reps of this.  Gripping weight collar, and lifting it to place on top hook of squat rack, 3 reps of 4 collars  Lat pulldown machine 40# 2X10 Shoulder press machine 10# 2X5 reps Chest press machine 10# 2X10 reps Standing shoulder abduction bilat 3# 2X10  01/31/24 UBE L4 x 4 min fwd/4 min bwd Wall ladder X 10 reps flexion, X 10 reps scaption, holding 5 sec Standing forward press at pulleys 2.5# 2x10 Standing row at pulleys 22# 2x10 each low, middle, and high Standing bicep curl at pulleys 7.5# 2 X 10 Standing push down at pulleys down 7.5# x10 each Standing IR at pulleys 2.5# 2X10 Standing ER at pulleys 2.5# 2X10 Standing on bench, feeding stretch out strap through pull up handles to simulate running electric wire, 2 times to left, 2 times to right Standing lifting 2.5# plate overhead to top weight rack on squat rack, then taking them back down, 3 reps of this.  Gripping weight collar, and lifting it to place on top hook of squat rack, 3 reps of 4 collars  Standing shoulder abduction bilat 2# 2X10   PATIENT EDUCATION: Education details: exam findings, POC, HEP, post-op limitations from subscap repair (most conservative part of his surgery/slowest recovering) Person educated: Patient Education method: Explanation, Demonstration, Verbal cues, and Handouts Education comprehension: verbalized  understanding, returned demonstration, and needs further education  HOME EXERCISE PROGRAM:   Access Code: [email or web address] URL: [email or web address] Date: 11/30/2023 Prepared by: Josette Rough  Exercises - Supine Shoulder Flexion Extension AAROM with Dowel  - 2-3 x daily -  7 x weekly - 1 sets - 10 reps - 3 seconds  hold - Supine Shoulder Press AAROM in Abduction with Dowel  - 2-3 x daily - 7 x weekly - 1 sets - 10 reps - Seated Shoulder External Rotation AAROM with Cane and Hand in Neutral  - 2-3 x daily - 7 x weekly - 1 sets - 10 reps - 3 seconds  hold - Shoulder Flexion Wall Slide with Towel  - 2-3 x daily - 7 x weekly - 1 sets - 10 reps - 3 seconds  hold - Seated Scapular Retraction  - 2-3 x daily - 7 x weekly - 1 sets - 10 reps - Seated Backward Shoulder Rolls  - 2-3 x daily - 7 x weekly - 1 sets - 10 reps Added 12/27/23 (he denies needing pictures of these) Scapular retractions red x15 Shoulder extensions red x15 Shoulder IR red X 10 Shoulder ER red X 10 Bicep curl red X 15 Tricep extension red X 15 Wall push ups (scap protraction) X 10  ASSESSMENT:  CLINICAL IMPRESSION:  Progressed strength program today with good tolerance. He has a lot more strength for pulling movements vs pushing movements so incorporated more pushing activities today.    EVAL: Patient is a 53 y.o. M who was seen today for physical therapy evaluation and treatment for  Diagnosis  M19.011 (ICD-10-CM) - Arthritis of right acromioclavicular joint  S46.811D (ICD-10-CM) - Partial tear of right subscapularis tendon, subsequent encounter  . He received surgery as noted above on 10/16/23, also had subscapularis repair which at this point remains the most limiting part of his protocol. MD did not send a formal post-op protocol so used subscap repair protocol from [employer]. Will challenge him as appropriate and able as per protocol and postop restrictions.   OBJECTIVE IMPAIRMENTS: decreased  mobility, decreased ROM, decreased strength, increased fascial restrictions, increased muscle spasms, impaired UE functional use, improper body mechanics, postural dysfunction, obesity, and pain.   ACTIVITY LIMITATIONS: carrying, lifting, continence, bathing, toileting, dressing, reach over head, hygiene/grooming, and caring for others  PARTICIPATION LIMITATIONS: meal prep, cleaning, laundry, driving, shopping, community activity, occupation, and yard work  PERSONAL FACTORS: Age, Behavior pattern, Education, Fitness, Past/current experiences, Profession, Social background, and Time since onset of injury/illness/exacerbation are also affecting patient's functional outcome.   REHAB POTENTIAL: Good  CLINICAL DECISION MAKING: Stable/uncomplicated  EVALUATION COMPLEXITY: Low   GOALS: Goals reviewed with patient? No  SHORT TERM GOALS: Target date: 12/28/2023    Patient will be compliant with appropriate progressive HEP        GOAL STATUS: MET 12/25/23  2. R  shoulder flexion and scaption AROM to be at least 150*         GOAL STATUS: MET 01/31/24   3. R shoulder ER and IR A/PROM to be at least 45* at 45* ABD           GOAL STATUS: MET 12/25/23   4. Will be more aware of posture with all functional tasks with use of ergonomic aides PRN/as desired      GOAL STATUS: MET 01/31/24   LONG TERM GOALS: Target date: 01/25/2024    MMT to have improved by at least one grade in all weak groups       GOAL STATUS: Initial    2. AROM to have normalized and will be pain free all planes of motion      GOAL STATUS: Initial    3. Pain to be no  more than 2/10 with all functional tasks     GOAL STATUS: Initial   4. Will be able to perform all functional household and work based tasks without increase from resting pain levels including [place] work for his job as an personnel officer      GOAL STATUS: Initial   5. PSFS  to have improved by at least 3 points to show improved QOL and subjective  perception of condition      GOAL STATUS: Initial    PLAN:  PT FREQUENCY: 2x/week  PT DURATION: 8 weeks  PLANNED INTERVENTIONS: 97164- PT Re-evaluation, 97750- Physical Performance Testing, 97110-Therapeutic exercises, 97530- Therapeutic activity, [id number]- Neuromuscular re-education, 97535- Self Care, [id number]- Manual therapy, [id number]- Electrical stimulation (manual), [id number]- Vasopneumatic device, [id number]- Ultrasound, 02966- Ionotophoresis 4mg /ml Dexamethasone , and Patient/Family education  PLAN FOR NEXT SESSION:   strength as tolerated, and work toward to return to work as personnel officer over remaining visits.   [email or web address]  Keep head elevated in supine, no bending forward- eye needs to be above waist per pt; had cataract  01/19/24  Redell JONELLE Moose, PT, DPT 02/06/2024 10:23 AM     "

## 2024-02-09 ENCOUNTER — Ambulatory Visit: Admitting: Physical Therapy

## 2024-02-14 ENCOUNTER — Ambulatory Visit: Admitting: Physical Therapy

## 2024-02-16 ENCOUNTER — Ambulatory Visit: Admitting: Physical Therapy

## 2024-02-19 ENCOUNTER — Ambulatory Visit: Admitting: Physical Therapy

## 2024-02-20 ENCOUNTER — Ambulatory Visit: Admitting: Orthopedic Surgery

## 2024-02-23 ENCOUNTER — Ambulatory Visit: Admitting: Orthopedic Surgery

## 2024-02-23 ENCOUNTER — Encounter: Payer: Self-pay | Admitting: Orthopedic Surgery

## 2024-02-23 DIAGNOSIS — M19011 Primary osteoarthritis, right shoulder: Secondary | ICD-10-CM | POA: Diagnosis not present

## 2024-02-23 DIAGNOSIS — S46811D Strain of other muscles, fascia and tendons at shoulder and upper arm level, right arm, subsequent encounter: Secondary | ICD-10-CM

## 2024-02-23 NOTE — Progress Notes (Signed)
 Orthopaedic Postop Note  Assessment: Derrick Turner is a 54 y.o. male s/p right shoulder arthroscopy, repair of subscapularis and distal clavicle excision  DOS: 10/16/2023  Plan: Derrick Turner is doing pretty good at this time.  He reports that he has not been as diligent with his exercises recently due to some family emergencies.  Nonetheless, he is not complaining of pain.  He has difficulty with repeated lifting and repetitive motion.  On exam he has full range of motion and good strength.  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  I provided documentation.  He will return to clinic in 6 weeks.   Follow-up: Return in about 6 weeks (around 04/05/2024). XR at next visit: None  Subjective:  Chief Complaint  Patient presents with   Routine Post Op    R shoulder DOS 10/16/23    History of Present Illness: Derrick Turner is a 54 y.o. male who presents following the above stated procedure.  Surgery was approximately 4 months ago.  He is not having any pain in the right shoulder.  He has been doing exercises on his own.  Unfortunately, he has had some family emergencies, and has not been diligent with his exercises.  He feels as though his strength is good, but has some difficulty with repetitive motion.  He gets tired easily.  No numbness or tingling.  No issues with his incisions.  Review of Systems: No fevers or chills No numbness or tingling No Chest Pain No shortness of breath   Objective: There were no vitals taken for this visit.  Physical Exam:  Right shoulder incisions are healed.  No surrounding erythema or drainage.  He has full forward flexion.  Internal rotation to lumbar spine.  Negative Jobes.  5/5 deltoid, supraspinatus and infraspinatus strength.  He has 5/5 strength in the right subscapularis.  He tires easily with repetitive motion.  Fingers are warm and  well-perfused.    IMAGING: I personally ordered and reviewed the following images:  No new imaging obtained today.   [name], MD 02/23/2024 9:04 AM

## 2024-02-23 NOTE — Patient Instructions (Signed)
 Note for work - unable to return to work.  He cannot lift anything greater than 10 pounds repeatedly.  Out of work until the next visit

## 2024-04-05 ENCOUNTER — Ambulatory Visit: Admitting: Orthopedic Surgery

## 2024-07-16 ENCOUNTER — Ambulatory Visit: Admitting: Rheumatology
